# Patient Record
Sex: Female | Born: 1940 | Race: Black or African American | Hispanic: No | Smoking: Never smoker
Health system: Southern US, Community
[De-identification: ages and names within clinical notes are randomized; demographics above are authoritative.]

## PROBLEM LIST (undated history)

## (undated) DIAGNOSIS — I639 Cerebral infarction, unspecified: Secondary | ICD-10-CM

## (undated) DIAGNOSIS — G4730 Sleep apnea, unspecified: Secondary | ICD-10-CM

## (undated) DIAGNOSIS — I10 Essential (primary) hypertension: Secondary | ICD-10-CM

## (undated) DIAGNOSIS — C9210 Chronic myeloid leukemia, BCR/ABL-positive, not having achieved remission: Secondary | ICD-10-CM

## (undated) DIAGNOSIS — M1990 Unspecified osteoarthritis, unspecified site: Secondary | ICD-10-CM

## (undated) DIAGNOSIS — E669 Obesity, unspecified: Secondary | ICD-10-CM

## (undated) DIAGNOSIS — K219 Gastro-esophageal reflux disease without esophagitis: Secondary | ICD-10-CM

## (undated) DIAGNOSIS — E119 Type 2 diabetes mellitus without complications: Secondary | ICD-10-CM

## (undated) HISTORY — PX: COLONOSCOPY: SHX174

## (undated) HISTORY — PX: TUBAL LIGATION: SHX77

## (undated) HISTORY — PX: APPENDECTOMY: SHX54

---

## 2005-03-09 ENCOUNTER — Ambulatory Visit: Payer: Self-pay | Admitting: Orthopedic Surgery

## 2008-12-19 ENCOUNTER — Ambulatory Visit: Payer: Self-pay | Admitting: Family Medicine

## 2009-03-25 ENCOUNTER — Ambulatory Visit: Payer: Self-pay | Admitting: Family Medicine

## 2009-04-02 ENCOUNTER — Ambulatory Visit: Payer: Self-pay | Admitting: Family Medicine

## 2009-04-10 ENCOUNTER — Ambulatory Visit: Payer: Self-pay | Admitting: Family Medicine

## 2009-07-16 ENCOUNTER — Inpatient Hospital Stay: Payer: Self-pay | Admitting: Internal Medicine

## 2009-10-09 ENCOUNTER — Emergency Department: Payer: Self-pay | Admitting: Unknown Physician Specialty

## 2009-10-09 ENCOUNTER — Ambulatory Visit: Payer: Self-pay | Admitting: Family Medicine

## 2009-12-05 ENCOUNTER — Emergency Department: Payer: Self-pay | Admitting: Emergency Medicine

## 2010-01-30 ENCOUNTER — Ambulatory Visit: Payer: Self-pay | Admitting: Orthopedic Surgery

## 2010-02-23 ENCOUNTER — Ambulatory Visit: Payer: Self-pay | Admitting: Pain Medicine

## 2010-03-26 ENCOUNTER — Ambulatory Visit: Payer: Self-pay | Admitting: Pain Medicine

## 2010-03-30 ENCOUNTER — Ambulatory Visit: Payer: Self-pay | Admitting: Pain Medicine

## 2010-05-05 ENCOUNTER — Ambulatory Visit: Payer: Self-pay | Admitting: Pain Medicine

## 2010-05-27 ENCOUNTER — Ambulatory Visit: Payer: Self-pay | Admitting: Pain Medicine

## 2010-06-25 ENCOUNTER — Ambulatory Visit: Payer: Self-pay | Admitting: Pain Medicine

## 2010-06-29 ENCOUNTER — Ambulatory Visit: Payer: Self-pay | Admitting: Pain Medicine

## 2010-09-14 ENCOUNTER — Ambulatory Visit: Payer: Self-pay | Admitting: Family Medicine

## 2010-10-26 ENCOUNTER — Ambulatory Visit: Payer: Self-pay | Admitting: Pain Medicine

## 2010-11-26 ENCOUNTER — Ambulatory Visit: Payer: Self-pay | Admitting: Pain Medicine

## 2010-12-09 ENCOUNTER — Ambulatory Visit: Payer: Self-pay | Admitting: Pain Medicine

## 2011-01-11 ENCOUNTER — Ambulatory Visit: Payer: Self-pay | Admitting: Pain Medicine

## 2011-04-22 ENCOUNTER — Ambulatory Visit: Payer: Self-pay | Admitting: Pain Medicine

## 2011-05-20 ENCOUNTER — Ambulatory Visit: Payer: Self-pay | Admitting: Pain Medicine

## 2011-05-24 ENCOUNTER — Ambulatory Visit: Payer: Self-pay | Admitting: Pain Medicine

## 2011-11-03 ENCOUNTER — Ambulatory Visit: Payer: Self-pay | Admitting: Pain Medicine

## 2011-12-02 ENCOUNTER — Ambulatory Visit: Payer: Self-pay | Admitting: Pain Medicine

## 2011-12-15 ENCOUNTER — Ambulatory Visit: Payer: Self-pay | Admitting: Pain Medicine

## 2011-12-22 ENCOUNTER — Ambulatory Visit: Payer: Self-pay | Admitting: Family Medicine

## 2012-01-13 ENCOUNTER — Ambulatory Visit: Payer: Self-pay | Admitting: Pain Medicine

## 2012-09-21 ENCOUNTER — Ambulatory Visit: Payer: Self-pay | Admitting: Family Medicine

## 2014-01-21 ENCOUNTER — Ambulatory Visit: Payer: Self-pay | Admitting: Gastroenterology

## 2014-01-23 LAB — PATHOLOGY REPORT

## 2014-07-12 ENCOUNTER — Ambulatory Visit: Payer: Self-pay | Admitting: Family Medicine

## 2014-12-05 ENCOUNTER — Ambulatory Visit: Payer: Self-pay | Admitting: Otolaryngology

## 2014-12-05 LAB — CREATININE, SERUM
Creatinine: 0.96 mg/dL (ref 0.60–1.30)
EGFR (African American): >60

## 2015-09-16 ENCOUNTER — Emergency Department (HOSPITAL_COMMUNITY)
Admission: EM | Admit: 2015-09-16 | Discharge: 2015-09-16 | Disposition: A | Discharge status: Home / Self Care | Payer: Medicare HMO | Source: Home / Self Care | Attending: Emergency Medicine | Admitting: Emergency Medicine

## 2015-09-16 ENCOUNTER — Encounter (HOSPITAL_COMMUNITY): Payer: Self-pay | Admitting: *Deleted

## 2015-09-16 ENCOUNTER — Emergency Department (INDEPENDENT_AMBULATORY_CARE_PROVIDER_SITE_OTHER): Payer: Medicare HMO

## 2015-09-16 DIAGNOSIS — M542 Cervicalgia: Secondary | ICD-10-CM

## 2015-09-16 DIAGNOSIS — M62838 Other muscle spasm: Secondary | ICD-10-CM

## 2015-09-16 HISTORY — DX: Sleep apnea, unspecified: G47.30

## 2015-09-16 HISTORY — DX: Essential (primary) hypertension: I10

## 2015-09-16 HISTORY — DX: Chronic myeloid leukemia, BCR/ABL-positive, not having achieved remission: C92.10

## 2015-09-16 MED ORDER — LORAZEPAM 2 MG/ML IJ SOLN
1.0000 mg | Freq: Once | INTRAMUSCULAR | Status: AC
Start: 2015-09-16 — End: 2015-09-16
  Administered 2015-09-16: 1 mg via INTRAMUSCULAR

## 2015-09-16 MED ORDER — HYDROCODONE-ACETAMINOPHEN 5-325 MG PO TABS
ORAL_TABLET | ORAL | Status: AC
  Filled 2015-09-16: qty 2

## 2015-09-16 MED ORDER — METAXALONE 800 MG PO TABS: 800.0000 mg | ORAL_TABLET | Freq: Three times a day (TID) | ORAL | Status: DC

## 2015-09-16 MED ORDER — HYDROCODONE-ACETAMINOPHEN 5-325 MG PO TABS
1.0000 | ORAL_TABLET | ORAL | Status: DC | PRN
Start: 2015-09-16 — End: 2020-03-03

## 2015-09-16 MED ORDER — HYDROCODONE-ACETAMINOPHEN 5-325 MG PO TABS
2.0000 | ORAL_TABLET | Freq: Once | ORAL | Status: AC
  Administered 2015-09-16: 2 via ORAL

## 2015-09-16 MED ORDER — LORAZEPAM 2 MG/ML IJ SOLN
INTRAMUSCULAR | Status: AC
  Filled 2015-09-16: qty 1

## 2015-09-16 MED ORDER — DICLOFENAC SODIUM 50 MG PO TBEC: 50.0000 mg | DELAYED_RELEASE_TABLET | Freq: Two times a day (BID) | ORAL | Status: AC

## 2015-09-16 NOTE — ED Notes (Signed)
Pt  Has  Neck pain     As  Well  As  A   Cough        And  Congestion        X  sev  Days       Pt  Reports  The  Pain  Is  Worse   On  Movement  And    Position       She        Is   Sitting       Upright        On  Exam table  Speaking  In  Complete  sentances

## 2015-09-16 NOTE — ED Provider Notes (Signed)
HPI  SUBJECTIVE:  Pamela Cox is a 74 y.o. female who presents with 2 days of right-sided neck pain that she describes a sharp, stabbing. States that it is primarily along the upper neck shoulder, and behind her ear. States is intermittent, lasting hours. States the area feels swollen. Symptoms are better with ibuprofen, worse with turning her head to the right more than the left. She tried IBU 800 mg last dose at 1700, Voltaren gel, bio flex gel, heating pad,  Flexeril. She denies nausea, vomiting, fever, hot potato voice, sore throat, arm numbness, tingling, arm weakness. She reports intermittent right-sided shoulder pain for the past 3 weeks with radiation to the thumb. She denies any recent or remote history of injury to her neck. She states that her symptoms started after she "slept wrong on her neck". She has not had any change in her physical activity. Past medical history of hypertension, CML leukemia, left-sided stroke, OA, history of diabetes, history of herniated lumbar disc. She is not on any medications that would cause torticollis.  Past Medical History  Diagnosis Date  . Hypertension   . Myelocytic leukemia, chronic ([place])   . Sleep apnea     History reviewed. No pertinent past surgical history.  History reviewed. No pertinent family history.  Social History  Substance Use Topics  . Smoking status: Never Smoker   . Smokeless tobacco: None  . Alcohol Use: No    No current facility-administered medications for this encounter.  Current outpatient prescriptions:  .  Fexofenadine HCl (ALLEGRA PO), Take by mouth., Disp: , Rfl:  .  diclofenac (VOLTAREN) 50 MG EC tablet, Take 1 tablet (50 mg total) by mouth 2 (two) times daily., Disp: 20 tablet, Rfl: 0 .  HYDROcodone-acetaminophen (NORCO/VICODIN) 5-325 MG tablet, Take 1-2 tablets by mouth every 4 (four) hours as needed for moderate pain., Disp: 20 tablet, Rfl: 0 .  metaxalone (SKELAXIN) 800 MG tablet, Take 1 tablet (800 mg  total) by mouth 3 (three) times daily., Disp: 21 tablet, Rfl: 0  No Known Allergies   ROS  As noted in HPI.   Physical Exam  BP 157/89 mmHg  Pulse 73  Temp(Src) 98.9 F (37.2 C) (Oral)  Resp 16  SpO2 96%  Constitutional: Well developed, well nourished, moderate painful distress Eyes:  EOMI, conjunctiva normal bilaterally HENT: Normocephalic, atraumatic,mucus membranes moist. No tenderness along the right TMJ. No drooling, no trismus. Voice normal.  Neck: Right-sided Trapezius, SCM, tenderness, muscle spasm. + Limitation of motion with head rotation. Patient able to flex/extend neck. No meningismus. Respiratory: Normal inspiratory effort Cardiovascular: Normal rate. No carotid bruit GI: nondistended skin: No rash, skin intact Musculoskeletal: Normal shoulder exam, normal right upper extremity exam. Grip strength 5/5 equal bilaterally. Sensation grossly intact distally. Biceps triceps tendon 5/5 equal bilaterally. Neurologic: Alert & oriented x 3, no focal neuro deficits Psychiatric: Speech and behavior appropriate   ED Course   Medications  LORazepam (ATIVAN) injection 1 mg (1 mg Intramuscular Given 09/16/15 2057)  HYDROcodone-acetaminophen (NORCO/VICODIN) 5-325 MG per tablet 2 tablet (2 tablets Oral Given 09/16/15 2134)    Orders Placed This Encounter  Procedures  . DG Cervical Spine Complete    Standing Status: Standing     Number of Occurrences: 1     Standing Expiration Date:     Order Specific Question:  Reason for Exam (SYMPTOM  OR DIAGNOSIS REQUIRED)    Answer:  R sided neck pain h/o CML r/o mets, DJD, fx, dislocation  Comments:  R sided neck pain h/o CML r/o mets, DJD, fx, dislocation    No results found for this or any previous visit (from the past 24 hour(s)). Dg Cervical Spine Complete  09/16/2015  CLINICAL DATA:  Slept on pillow wrong, with right-sided neck pain. Initial encounter. EXAM: CERVICAL SPINE - COMPLETE 4+ VIEW COMPARISON:  None. FINDINGS:  There is no evidence of fracture or subluxation. Anterior osteophytes are noted along the cervical spine, with mild disc space narrowing noted at C4-C5. Vertebral bodies demonstrate normal height and alignment. Prevertebral soft tissues are within normal limits. The provided odontoid view demonstrates no significant abnormality. The visualized lung apices are clear. IMPRESSION: 1. No evidence of fracture or subluxation along the cervical spine. 2. Minimal degenerative change along the cervical spine. Electronically Signed   By: Garald Balding M.D.   On: 09/16/2015 21:26    ED Clinical Impression  Neck pain  Muscle spasms of neck  ED Assessment/Plan   Patient appears to be in moderate amount of pain and has significant muscle spasm gave Ativan 1 mg IM, norco 2 tabs po, with improvement in symptoms. Patient already medicated with NSAIDs at home. Presentation is most consistent with a muscle strain/sprain/torticollis. No evidence of deep space oropharyngeal infection, meningitis, dissection, septic phlebitis, spinal epidural hematoma.  Home with diclofenac, skelaxin, norco.  Getting C-spine x-ray to rule out any acute changes most specifically pathologic fracture or metastases.  Reviewed imaging independently. Minimal Degenerative changes along cervical spine, no fracture, dislocation. See radiology report for full details.  Discussed imaging, MDM, plan and followup with patient . Discussed sn/sx that should prompt return to the UC or ED. Patient  agrees with plan.   *This clinic note was created using Dragon dictation software. Therefore, there may be occasional mistakes despite careful proofreading.  ?    Melynda Ripple, MD 09/16/15 2152

## 2016-04-14 ENCOUNTER — Other Ambulatory Visit: Payer: Self-pay | Admitting: Family Medicine

## 2018-08-23 ENCOUNTER — Ambulatory Visit: Payer: Medicare HMO | Attending: Physical Medicine and Rehabilitation | Admitting: Physical Therapy

## 2018-08-23 ENCOUNTER — Encounter: Payer: Self-pay | Admitting: Physical Therapy

## 2018-08-23 ENCOUNTER — Other Ambulatory Visit: Payer: Self-pay

## 2018-08-23 DIAGNOSIS — Z9289 Personal history of other medical treatment: Secondary | ICD-10-CM | POA: Insufficient documentation

## 2018-08-23 DIAGNOSIS — M5136 Other intervertebral disc degeneration, lumbar region: Secondary | ICD-10-CM | POA: Insufficient documentation

## 2018-08-23 DIAGNOSIS — R262 Difficulty in walking, not elsewhere classified: Secondary | ICD-10-CM

## 2018-08-23 DIAGNOSIS — M5116 Intervertebral disc disorders with radiculopathy, lumbar region: Secondary | ICD-10-CM | POA: Insufficient documentation

## 2018-08-23 DIAGNOSIS — M6281 Muscle weakness (generalized): Secondary | ICD-10-CM

## 2018-08-23 DIAGNOSIS — M1990 Unspecified osteoarthritis, unspecified site: Secondary | ICD-10-CM | POA: Insufficient documentation

## 2018-08-23 DIAGNOSIS — M25511 Pain in right shoulder: Secondary | ICD-10-CM

## 2018-08-23 DIAGNOSIS — M169 Osteoarthritis of hip, unspecified: Secondary | ICD-10-CM | POA: Insufficient documentation

## 2018-08-23 DIAGNOSIS — M5442 Lumbago with sciatica, left side: Secondary | ICD-10-CM | POA: Diagnosis present

## 2018-08-23 DIAGNOSIS — R0602 Shortness of breath: Secondary | ICD-10-CM | POA: Insufficient documentation

## 2018-08-23 DIAGNOSIS — M25512 Pain in left shoulder: Secondary | ICD-10-CM | POA: Insufficient documentation

## 2018-08-23 DIAGNOSIS — M5441 Lumbago with sciatica, right side: Secondary | ICD-10-CM | POA: Insufficient documentation

## 2018-08-23 DIAGNOSIS — M5416 Radiculopathy, lumbar region: Secondary | ICD-10-CM | POA: Insufficient documentation

## 2018-08-23 DIAGNOSIS — R293 Abnormal posture: Secondary | ICD-10-CM | POA: Diagnosis present

## 2018-08-23 DIAGNOSIS — F341 Dysthymic disorder: Secondary | ICD-10-CM | POA: Insufficient documentation

## 2018-08-23 DIAGNOSIS — M48062 Spinal stenosis, lumbar region with neurogenic claudication: Secondary | ICD-10-CM | POA: Insufficient documentation

## 2018-08-23 DIAGNOSIS — M47816 Spondylosis without myelopathy or radiculopathy, lumbar region: Secondary | ICD-10-CM | POA: Insufficient documentation

## 2018-08-23 DIAGNOSIS — M75121 Complete rotator cuff tear or rupture of right shoulder, not specified as traumatic: Secondary | ICD-10-CM | POA: Insufficient documentation

## 2018-08-23 DIAGNOSIS — C9210 Chronic myeloid leukemia, BCR/ABL-positive, not having achieved remission: Secondary | ICD-10-CM | POA: Insufficient documentation

## 2018-08-23 DIAGNOSIS — K219 Gastro-esophageal reflux disease without esophagitis: Secondary | ICD-10-CM | POA: Insufficient documentation

## 2018-08-23 DIAGNOSIS — E785 Hyperlipidemia, unspecified: Secondary | ICD-10-CM | POA: Insufficient documentation

## 2018-08-23 DIAGNOSIS — G8929 Other chronic pain: Secondary | ICD-10-CM

## 2018-08-23 DIAGNOSIS — Z79899 Other long term (current) drug therapy: Secondary | ICD-10-CM | POA: Insufficient documentation

## 2018-08-23 DIAGNOSIS — Z8673 Personal history of transient ischemic attack (TIA), and cerebral infarction without residual deficits: Secondary | ICD-10-CM | POA: Insufficient documentation

## 2018-08-23 DIAGNOSIS — M51369 Other intervertebral disc degeneration, lumbar region without mention of lumbar back pain or lower extremity pain: Secondary | ICD-10-CM | POA: Insufficient documentation

## 2018-08-23 DIAGNOSIS — E559 Vitamin D deficiency, unspecified: Secondary | ICD-10-CM | POA: Insufficient documentation

## 2018-08-23 DIAGNOSIS — G4733 Obstructive sleep apnea (adult) (pediatric): Secondary | ICD-10-CM | POA: Insufficient documentation

## 2018-08-23 DIAGNOSIS — J301 Allergic rhinitis due to pollen: Secondary | ICD-10-CM | POA: Insufficient documentation

## 2018-08-23 DIAGNOSIS — I10 Essential (primary) hypertension: Secondary | ICD-10-CM | POA: Insufficient documentation

## 2018-08-23 NOTE — Patient Instructions (Signed)
Access Code: [email or web address]  URL: [email or web address]  Date: 08/23/2018  Prepared by: Venetia Night Taunya Goral   Exercises  Hooklying Single Knee to Chest - 10 reps - 2 sets - 5 hold - 1x daily - 7x weekly  Supine Piriformis Stretch with Foot on Ground - 3 reps - 1 sets - 20 hold - 1x daily - 7x weekly  Patient Education  Rolling From Your Back to Your Side

## 2018-08-23 NOTE — Therapy (Signed)
[facility] [address], [place], [place], [address] Phone: [phone]   Fax:  [phone]  Physical Therapy Evaluation  Patient Details  Name: Pamela Cox MRN: [id number] Date of Birth: Sep 04, 1941 Referring Provider (PT): Edger House, MD   Encounter Date: 08/23/2018  PT End of Session - 08/23/18 2052    Visit Number  1    Date for PT Re-Evaluation  10/18/18    Authorization Type  [employer]    PT Start Time  [id number]    PT Stop Time  1022    PT Time Calculation (min)  45 min    Activity Tolerance  Patient tolerated treatment well;Patient limited by pain    Behavior During Therapy  [facility] for tasks assessed/performed       Past Medical History:  Diagnosis Date  . Hypertension   . Myelocytic leukemia, chronic ([place])   . Sleep apnea     History reviewed. No pertinent surgical history.  There were no vitals filed for this visit.   Subjective Assessment - 08/23/18 2032    Subjective  Pt reports a pinched nerve in her back.  She has had 2 epidural injections which has helped her back pain but she continues to have bil Lt>Rt buttock, hip and posterior thigh pain.    Patient is accompained by:  Family member    Limitations  Sitting;Standing;Walking;House hold activities;Other (comment)    How long can you sit comfortably?  1 hour    How long can you stand comfortably?  5 min    How long can you walk comfortably?  5 min, uses 2 wheel walker in community, wheelchair at home to be able to sit     Diagnostic tests  MRI through [facility]    Patient Stated Goals  avoid surgery, improve walking, reduce pain    Currently in Pain?  Yes    Pain Score  6     Pain Location  Buttocks    Pain Orientation  Right;Left;Posterior    Pain Descriptors / Indicators  Tightness;Spasm;Dull    Pain Type  Acute pain    Pain Radiating Towards  bil Lt>Rt buttocks, hips, posterior thighs    Pain Onset  More than a month ago    Pain  Frequency  Intermittent    Aggravating Factors   standing/walking, stairs, sitting too long in wheelchair, in/out of bed    Pain Relieving Factors  meds, topical pain cream, laying down, ice    Effect of Pain on Daily Activities  difficulty getting up/down stairs at home, sits in wheelchair on first floor all day, uses 2 wheel walker if needs to walk         [facility] PT Assessment - 08/23/18 0001      Assessment   Medical Diagnosis  M51.16 (ICD-10-CM) - Intervertebral disc disorders with radiculopathy, lumbar region    Referring Provider (PT)  Edger House, MD    Next MD Visit  as needed   Pt states lumbar surgery is next step if PT doesn't help   Prior Therapy  no      Balance Screen   Has the patient fallen in the past 6 months  No    Has the patient had a decrease in activity level because of a fear of falling?   Yes   in community   Is the patient reluctant to leave their home because of a fear of falling?   No  Home Environment   Living Environment  Private residence    Living Arrangements  Children;Other relatives   adult daughter and grandson   Available Help at Discharge  Family    Type of [place] Access  Level entry    [place]  Two level    Alternate Level Stairs-Number of Steps  one flight    Alternate Level Stairs-Rails  [place] - 2 wheels;Wheelchair - manual;Toilet riser;Shower seat      Prior Function   Level of Independence  Independent with household mobility with device;Requires assistive device for independence;Independent with community mobility with device    Vocation  Retired    Leisure  get out of the house, ride in car, read      Cognition   Overall Cognitive Status  Within Functional Limits for tasks assessed      Observation/Other Assessments   Observations  obese    Focus on Therapeutic Outcomes (FOTO)   --   will do FOTO survey next visit     Posture/Postural Control   Posture/Postural Control  Postural  limitations    Postural Limitations  Flexed trunk;Weight shift left;Right pelvic obliquity      Tone   Assessment Location  Left Lower Extremity      ROM / Strength   AROM / PROM / Strength  AROM;Strength      AROM   Overall AROM   Deficits    AROM Assessment Site  Lumbar;Hip    Right/Left Hip  Right;Left   bil hips grossly WNL for tasks assessed   Lumbar Flexion  12   Pt able to perform greater trunk flexion via hip flexion   Lumbar Extension  5    Lumbar - Right Side Bend  8    Lumbar - Left Side Bend  10    Lumbar - Right Rotation  5    Lumbar - Left Rotation  5      Strength   Overall Strength  Deficits    Strength Assessment Site  Lumbar;Hip;Knee;Ankle    Right/Left Hip  Right;Left    Right Hip Flexion  4-/5    Right Hip Extension  4-/5    Right Hip External Rotation   4-/5    Right Hip Internal Rotation  4-/5    Right Hip ABduction  3/5    Right Hip ADduction  4-/5    Left Hip Flexion  4-/5    Left Hip Extension  4-/5    Left Hip External Rotation  4-/5    Left Hip Internal Rotation  4-/5    Left Hip ABduction  3/5    Left Hip ADduction  4-/5    Right/Left Knee  Right;Left    Right Knee Flexion  4+/5    Right Knee Extension  4/5    Left Knee Flexion  4+/5    Left Knee Extension  4/5    Right/Left Ankle  Right;Left   bil ankles 5/5     Flexibility   Soft Tissue Assessment /Muscle Length  yes    Hamstrings  Lt limited by 30 degrees compared to Rt      Palpation   Spinal mobility  poor gross lumbar mobility with A/ROM      Special Tests    Special Tests  Lumbar    Lumbar Tests  Straight Leg Raise      Straight Leg Raise   Findings  Positive  Side   Left    Comment  at approx 60 degrees      Bed Mobility   Bed Mobility  Sit to Sidelying Left;Sit to Supine;Supine to Sit;Rolling Left   Pt needed max cueing for form, difficulty due to obesity     Ambulation/Gait   Ambulation/Gait  Yes    Ambulation/Gait Assistance  6: Modified independent  (Device/Increase time)    Assistive device  Rolling walker    Gait Comments  flexed trunk      LLE Tone   LLE Tone  Moderate;Hypertonic      LLE Tone   Hypertonic Details  hamstring                Objective measurements completed on examination: See above findings.      [facility] PT Treatment/Exercise - 08/23/18 0001      Exercises   Exercises  Lumbar      Lumbar Exercises: Stretches   Single Knee to Chest Stretch  Right;Left;5 reps;10 seconds    Single Knee to Chest Stretch Limitations  use a towel to help with UEs    Piriformis Stretch  Right;Left;20 seconds;2 reps    Piriformis Stretch Limitations  use a towel to assist with UEs             PT Education - 08/23/18 1347    Education Details  Access Code: [email or web address], beginning of HEP, evaluation findings    Person(s) Educated  Patient;Child(ren)    Methods  Explanation;Demonstration;Verbal cues;Handout    Comprehension  Verbalized understanding;Returned demonstration       PT Short Term Goals - 08/23/18 2106      PT SHORT TERM GOAL #1   Title  Pt will be independent in initial HEP.    Time  3    Period  Weeks    Status  New    Target Date  09/13/18      PT SHORT TERM GOAL #2   Title  Pt will report at least 20% improvement in pain throughout the day.    Time  4    Period  Weeks    Status  New    Target Date  09/20/18      PT SHORT TERM GOAL #3   Title  Pt will be able to tolerate standing or walking activity for > or = 10 min with pain rating < or = 6/10.    Time  4    Period  Weeks    Status  New    Target Date  09/20/18        PT Long Term Goals - 08/23/18 2108      PT LONG TERM GOAL #1   Title  Pt will be independent in advanced HEP.    Time  8    Period  Weeks    Status  New    Target Date  10/18/18      PT LONG TERM GOAL #2   Title  Pt will report at least 30% improvement in pain throughout the day.    Time  8    Period  Weeks    Status  New    Target Date  10/18/18       PT LONG TERM GOAL #3   Title  Pt will be able to go up and down one flight of stairs with improved body mechanics to get to and from her bedroom to increase safety and energy conservation.    Time  8    Period  Weeks    Status  New    Target Date  10/18/18      PT LONG TERM GOAL #4   Title  Pt will report incoropartion of 5-10 min of standing or walking tasks out of wheelchair at least 2 times daily with pain rating of < or = 5/10 to increase her mobility and endurance.    Time  8    Period  Weeks    Status  New    Target Date  10/18/18             Plan - 08/23/18 2053    Clinical Impression Statement  Pt is referred to PT with severe lumbar degenerative changes and bilateral radiculopathy Lt>Rt causing radiating pain into buttocks and posterior thighs.  Recent lumbar injections improved her back pain but her LE pain is severe.  Pt reports if PT doesn't help she will likely need surgery per the MD.  She has an extensive past medical history including obesity and is grossly limited in her mobility.  She was accompanied today by her adult daugther with who she lives.  She spends most of her day sitting at home in a wheelchair and struggles to negotiate the flight of stairs up and down from her second floor bedroom.  She has deconditioned weakness throughout bilateral LEs and significant trunk ROM deficits.  She is able to ambulate independently with a rolling walker.  She will benefit from skilled PT to improve her strength, ROM, body mechanics with bed mobility and transfers, postural re-ed, gait pattern and endurance, neural mobility of Lt LE and safety with daily task performance.    History and Personal Factors relevant to plan of care:  obesity, sedentary lifestyle, extensive past medical history, requires walker for gait and uses wheelchair at home    Clinical Presentation  Stable    Clinical Decision Making  Low    Rehab Potential  Good    PT Frequency  2x / week    PT Duration   8 weeks    PT Treatment/Interventions  ADLs/Self Care Home Management;Electrical Stimulation;Cryotherapy;Moist Heat;Traction;Gait training;Stair training;Functional mobility training;Therapeutic activities;Therapeutic exercise;Balance training;Neuromuscular re-education;Patient/family education;Manual techniques;Passive range of motion;Dry needling;Taping;Energy conservation;Joint Manipulations    PT Next Visit Plan  f/u on HEP (SKTC, piriformis stretch, review bed mobility), begin trunk and LE strengthening as tolerated, postural awareness, aerobic exercise as tolerated, add Lt LE neural glides    PT Home Exercise Plan  Access Code: [email or web address]    Consulted and Agree with Plan of Care  Patient;Family member/caregiver    Family Member Consulted  yes       Patient will benefit from skilled therapeutic intervention in order to improve the following deficits and impairments:  Abnormal gait, Decreased range of motion, Difficulty walking, Impaired tone, Cardiopulmonary status limiting activity, Decreased endurance, Increased muscle spasms, Obesity, Decreased activity tolerance, Pain, Hypomobility, Impaired flexibility, Improper body mechanics, Decreased strength, Decreased mobility, Postural dysfunction  Visit Diagnosis: Chronic bilateral low back pain with bilateral sciatica - Plan: PT plan of care cert/re-cert  Abnormal posture - Plan: PT plan of care cert/re-cert  Muscle weakness (generalized) - Plan: PT plan of care cert/re-cert  Difficulty in walking, not elsewhere classified - Plan: PT plan of care cert/re-cert     Problem List Patient Active Problem List   Diagnosis Date Noted  . Allergic rhinitis due to pollen 08/23/2018  . DDD (degenerative disc disease), lumbar 08/23/2018  . Lumbar neuritis 08/23/2018  .  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  . GERD (gastroesophageal reflux disease) 08/23/2018  . Morbid obesity ([place]) 08/23/2018  . Obstructive sleep apnea 08/23/2018  . Intervertebral disc  disorder with radiculopathy of lumbar region 08/23/2018  . CML (chronic myelocytic leukemia) ([place]) 08/23/2018  . Hx of myocardial perfusion scan 08/23/2018  . Osteoarthritis 08/23/2018  . Degenerative arthritis of hip 08/23/2018  . Lumbar spondylosis 08/23/2018  . SOB (shortness of breath) 08/23/2018  . History of CVA (cerebrovascular accident) 08/23/2018  . Essential hypertension 08/23/2018  . Chronic right shoulder pain 08/23/2018  . Complete tear of right rotator cuff 08/23/2018  . Acute pain of left shoulder 08/23/2018  . On antineoplastic chemotherapy 08/23/2018  . Hyperlipidemia 08/23/2018  . Neurogenic claudication due to lumbar spinal stenosis 08/23/2018  . Vitamin D deficiency 08/23/2018   Baruch Merl, PT 08/23/18 9:26 PM    [facility] [address], [place], [place], [address] Phone: [phone]   Fax:  [phone]  Name: Galilea Quito MRN: [id number] Date of Birth: 1941-03-29

## 2018-08-25 ENCOUNTER — Ambulatory Visit: Payer: Medicare HMO | Admitting: Physical Therapy

## 2018-08-25 ENCOUNTER — Encounter: Payer: Self-pay | Admitting: Physical Therapy

## 2018-08-25 DIAGNOSIS — G8929 Other chronic pain: Secondary | ICD-10-CM

## 2018-08-25 DIAGNOSIS — M6281 Muscle weakness (generalized): Secondary | ICD-10-CM

## 2018-08-25 DIAGNOSIS — R262 Difficulty in walking, not elsewhere classified: Secondary | ICD-10-CM

## 2018-08-25 DIAGNOSIS — R293 Abnormal posture: Secondary | ICD-10-CM

## 2018-08-25 DIAGNOSIS — M5442 Lumbago with sciatica, left side: Secondary | ICD-10-CM | POA: Diagnosis not present

## 2018-08-25 DIAGNOSIS — M5441 Lumbago with sciatica, right side: Principal | ICD-10-CM

## 2018-08-25 NOTE — Therapy (Signed)
[facility] [address] [place], [place], [address] Phone: [phone]   Fax:  [phone]  Physical Therapy Treatment  Patient Details  Name: Pamela Cox MRN: [id number] Date of Birth: 1941-04-28 Referring Provider (PT): Edger House, MD   Encounter Date: 08/25/2018  PT End of Session - 08/25/18 1120    Visit Number  2    Date for PT Re-Evaluation  10/18/18    Authorization Type  [employer]    PT Start Time  1103    PT Stop Time  1152    PT Time Calculation (min)  49 min    Activity Tolerance  Patient tolerated treatment well;Patient limited by pain    Behavior During Therapy  [facility] for tasks assessed/performed       Past Medical History:  Diagnosis Date  . Hypertension   . Myelocytic leukemia, chronic ([place])   . Sleep apnea     History reviewed. No pertinent surgical history.  There were no vitals filed for this visit.  Subjective Assessment - 08/25/18 1101    Subjective  Pt reports she feels that the stretches help but then increase her pain later.      Patient is accompained by:  Family member    Limitations  Sitting;Standing;Walking;House hold activities;Other (comment)    How long can you sit comfortably?  1 hour    How long can you stand comfortably?  5 min    How long can you walk comfortably?  5 min, uses 2 wheel walker in community, wheelchair at home to be able to sit     Diagnostic tests  MRI through [facility]    Patient Stated Goals  avoid surgery, improve walking, reduce pain    Currently in Pain?  Yes    Pain Score  6     Pain Location  Buttocks    Pain Orientation  Right;Left;Posterior    Pain Descriptors / Indicators  Dull    Pain Type  Chronic pain;Acute pain    Pain Onset  More than a month ago                       [facility] Adult PT Treatment/Exercise - 08/25/18 0001      Bed Mobility   Bed Mobility  Sit to Sidelying Right;Rolling Left;Rolling Right;Sit to Sidelying  Left;Right Sidelying to Sit;Left Sidelying to Sit   PT cued Pt to use core during transitions/UEs to assist     Exercises   Exercises  Lumbar;Knee/Hip      Lumbar Exercises: Seated   Other Seated Lumbar Exercises  large ball rollouts for trunk flexion x 10 reps    Other Seated Lumbar Exercises  high density pressure release balls bil hips, rocking back and forth over them bil   also performed in supine hooklying      Lumbar Exercises: Supine   Ab Set  10 reps;5 seconds    Large Ball Abdominal Isometric  5 seconds;10 reps    Large Ball Oblique Isometric  5 seconds;10 reps   bil     Manual Therapy   Manual Therapy  Soft tissue mobilization    Soft tissue mobilization  Lt posterior hip, glut min/med/max in Rt sidelying, rolling stick to Lt posterior thigh             PT Education - 08/25/18 1157    Person(s) Educated  Patient;Child(ren)    Methods  Explanation;Demonstration;Verbal cues    Comprehension  Verbalized understanding;Returned demonstration       PT Short Term Goals - 08/23/18 2106      PT SHORT TERM GOAL #1   Title  Pt will be independent in initial HEP.    Time  3    Period  Weeks    Status  New    Target Date  09/13/18      PT SHORT TERM GOAL #2   Title  Pt will report at least 20% improvement in pain throughout the day.    Time  4    Period  Weeks    Status  New    Target Date  09/20/18      PT SHORT TERM GOAL #3   Title  Pt will be able to tolerate standing or walking activity for > or = 10 min with pain rating < or = 6/10.    Time  4    Period  Weeks    Status  New    Target Date  09/20/18        PT Long Term Goals - 08/23/18 2108      PT LONG TERM GOAL #1   Title  Pt will be independent in advanced HEP.    Time  8    Period  Weeks    Status  New    Target Date  10/18/18      PT LONG TERM GOAL #2   Title  Pt will report at least 30% improvement in pain throughout the day.    Time  8    Period  Weeks    Status  New    Target Date   10/18/18      PT LONG TERM GOAL #3   Title  Pt will be able to go up and down one flight of stairs with improved body mechanics to get to and from her bedroom to increase safety and energy conservation.    Time  8    Period  Weeks    Status  New    Target Date  10/18/18      PT LONG TERM GOAL #4   Title  Pt will report incoropartion of 5-10 min of standing or walking tasks out of wheelchair at least 2 times daily with pain rating of < or = 5/10 to increase her mobility and endurance.    Time  8    Period  Weeks    Status  New    Target Date  10/18/18            Plan - 08/25/18 1159    Clinical Impression Statement  Pt with pain relief today with manual therapy via soft tissue mobilization to Lt buttock and posterior hip.  PT spent time having her practice self-release techniques today in sitting and supine with high density balls which she really felt relief from.  PT provided mod-max cueing for bed mobility mechanics with use of core as well as veral cueing for core recruitment during basic level lumbar stabilization exercises today.  PT consulted with Pt's adult daughter about the idea of putting a piece of plywood in base of Pt's wheelchair to provide better postural support throughout her day.  PT explained to Pt the goal of standing and walking on main level of home several times daily with walker to increase standing/walking endurance and changes of position as tolerated.  Pt will benefit from continued manual therapy and skilled progression of core and ther ex to address her  pain and deficits.    Rehab Potential  Good    PT Frequency  2x / week    PT Duration  8 weeks    PT Treatment/Interventions  ADLs/Self Care Home Management;Electrical Stimulation;Cryotherapy;Moist Heat;Traction;Gait training;Stair training;Functional mobility training;Therapeutic activities;Therapeutic exercise;Balance training;Neuromuscular re-education;Patient/family education;Manual techniques;Passive  range of motion;Dry needling;Taping;Energy conservation;Joint Manipulations    PT Next Visit Plan  dry needling to Lt posterior hip/buttocks, continue bed mobility, transfers, core stabilization, manual therapy to soft tissues of bil buttocks    PT Home Exercise Plan  Access Code: [email or web address]    Consulted and Agree with Plan of Care  Patient;Family member/caregiver       Patient will benefit from skilled therapeutic intervention in order to improve the following deficits and impairments:  Abnormal gait, Decreased range of motion, Difficulty walking, Impaired tone, Cardiopulmonary status limiting activity, Decreased endurance, Increased muscle spasms, Obesity, Decreased activity tolerance, Pain, Hypomobility, Impaired flexibility, Improper body mechanics, Decreased strength, Decreased mobility, Postural dysfunction  Visit Diagnosis: Chronic bilateral low back pain with bilateral sciatica  Abnormal posture  Muscle weakness (generalized)  Difficulty in walking, not elsewhere classified     Problem List Patient Active Problem List   Diagnosis Date Noted  . Allergic rhinitis due to pollen 08/23/2018  . DDD (degenerative disc disease), lumbar 08/23/2018  . Lumbar neuritis 08/23/2018  . 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  . GERD (gastroesophageal reflux disease) 08/23/2018  . Morbid obesity ([place]) 08/23/2018  . Obstructive sleep apnea 08/23/2018  . Intervertebral disc disorder with radiculopathy of lumbar region 08/23/2018  . CML (chronic myelocytic leukemia) ([place]) 08/23/2018  . Hx of myocardial perfusion scan 08/23/2018  . Osteoarthritis 08/23/2018  . Degenerative arthritis of hip 08/23/2018  . Lumbar spondylosis 08/23/2018  . SOB (shortness of breath) 08/23/2018  . History of CVA (cerebrovascular accident) 08/23/2018  . Essential hypertension 08/23/2018  . Chronic right shoulder pain 08/23/2018  . Complete tear of right rotator cuff 08/23/2018  . Acute pain of left shoulder 08/23/2018  .  On antineoplastic chemotherapy 08/23/2018  . Hyperlipidemia 08/23/2018  . Neurogenic claudication due to lumbar spinal stenosis 08/23/2018  . Vitamin D deficiency 08/23/2018   Baruch Merl, PT 08/25/18 12:09 PM    [facility] [address] [place], [place], [address] Phone: [phone]   Fax:  [phone]  Name: Pamela Cox MRN: [id number] Date of Birth: [birth year]

## 2018-08-25 NOTE — Patient Instructions (Addendum)
Use of high density balls for posterior hip and buttock muscle release in sitting and supine, placement of plywood or something more firm in base of wheelchair for seated stability and postural support throughout the day at home.

## 2018-08-28 ENCOUNTER — Encounter: Payer: Self-pay | Admitting: Physical Therapy

## 2018-08-28 ENCOUNTER — Ambulatory Visit: Payer: Medicare HMO | Admitting: Physical Therapy

## 2018-08-28 DIAGNOSIS — R293 Abnormal posture: Secondary | ICD-10-CM

## 2018-08-28 DIAGNOSIS — M6281 Muscle weakness (generalized): Secondary | ICD-10-CM

## 2018-08-28 DIAGNOSIS — G8929 Other chronic pain: Secondary | ICD-10-CM

## 2018-08-28 DIAGNOSIS — M5442 Lumbago with sciatica, left side: Secondary | ICD-10-CM | POA: Diagnosis not present

## 2018-08-28 DIAGNOSIS — M5441 Lumbago with sciatica, right side: Principal | ICD-10-CM

## 2018-08-28 DIAGNOSIS — R262 Difficulty in walking, not elsewhere classified: Secondary | ICD-10-CM

## 2018-08-28 NOTE — Patient Instructions (Signed)
Access Code: [email or web address]  URL: [email or web address]  Date: 08/28/2018  Prepared by: Venetia Night Beuhring   Exercises  Hooklying Single Knee to Chest - 10 reps - 2 sets - 5 hold - 1x daily - 7x weekly  Supine Piriformis Stretch with Foot on Ground - 3 reps - 1 sets - 20 hold - 1x daily - 7x weekly  Seated Slump Nerve Glide - 10 reps - 3 sets - 1x daily - 7x weekly  Standing Transverse Abdominis Contraction - 10 reps - 3 sets - 10 hold - 1x daily - 7x weekly  Sidelying Transversus Abdominis Bracing - 10 reps - 3 sets - 10 hold - 1x daily - 7x weekly  Supine Transversus Abdominis Bracing - Hands on Stomach - 10 reps - 3 sets - 10 hold - 1x daily - 7x weekly  Seated Alternating Shoulder Row with Resistance Anchored at Feet - 10 reps - 3 sets - 1x daily - 7x weekly  Seated Hip Adduction Squeeze with Ball - 20 reps - 2 sets - 5 hold - 1x daily - 7x weekly  Seated Marching with Opposite Shoulder Flexion - 20 reps - 2 sets - 1x daily - 7x weekly  Patient Education  Rolling From Your Back to Your Side

## 2018-08-28 NOTE — Therapy (Signed)
[facility] [address], [place], [place], [address] Phone: [phone]   Fax:  [phone]  Physical Therapy Treatment  Patient Details  Name: Pamela Cox MRN: [id number] Date of Birth: [birth year] Referring Provider (PT): Edger House, MD   Encounter Date: 08/28/2018  PT End of Session - 08/28/18 1330    Visit Number  3    Date for PT Re-Evaluation  10/18/18    Authorization Type  [employer]    PT Start Time  1005    PT Stop Time  1105   stim and heat end of session   PT Time Calculation (min)  60 min    Activity Tolerance  Patient tolerated treatment well;Patient limited by pain    Behavior During Therapy  [facility] for tasks assessed/performed       Past Medical History:  Diagnosis Date  . Hypertension   . Myelocytic leukemia, chronic ([place])   . Sleep apnea     History reviewed. No pertinent surgical history.  There were no vitals filed for this visit.  Subjective Assessment - 08/28/18 1013    Subjective  Pt reports she had some improved pain after last session but it only lasted until that evening.  Had 8/10 pain over the weekend.  Did exercises over the weekend and they didn't cause any increased pain.    Limitations  Sitting;Standing;Walking;House hold activities;Other (comment)    How long can you sit comfortably?  1 hour    How long can you stand comfortably?  5 min    How long can you walk comfortably?  5 min, uses 2 wheel walker in community, wheelchair at home to be able to sit     Diagnostic tests  MRI through [facility]    Patient Stated Goals  avoid surgery, improve walking, reduce pain    Currently in Pain?  Yes    Pain Score  5     Pain Location  Buttocks    Pain Orientation  Left    Pain Descriptors / Indicators  Dull;Aching    Pain Type  Chronic pain    Pain Onset  More than a month ago         [facility] PT Assessment - 08/28/18 0001      Observation/Other Assessments   Focus on  Therapeutic Outcomes (FOTO)   68%   goal 53%                  [facility] Adult PT Treatment/Exercise - 08/28/18 0001      Exercises   Exercises  Lumbar;Knee/Hip      Lumbar Exercises: Seated   Other Seated Lumbar Exercises  tall sitting red tband rows bil UEs with band around foot   PT cued for scapular retraction and core engagement   Other Seated Lumbar Exercises  seated sciatic tensioners 10x bil      Knee/Hip Exercises: Seated   Ball Squeeze  20 x 5 sec with core contraction    Marching  Strengthening;AROM;20 reps   PT cued for ab set during   Marching Limitations  added opposite shoulder flexion with marching      Modalities   Modalities  Electrical Stimulation;Moist Heat      Moist Heat Therapy   Number Minutes Moist Heat  10 Minutes    Moist Heat Location  Hip   Left     Electrical Stimulation   Electrical Stimulation Location  Lt hip/buttock    Electrical Stimulation Action  IFC    Electrical Stimulation Parameters  x12 min    Electrical Stimulation Goals  Pain      Manual Therapy   Manual Therapy  Soft tissue mobilization;Manual Traction;Joint mobilization    Joint Mobilization  Left facet gapping L4-S1 Gr III/IV in Lt sidelying    Soft tissue mobilization  Left gluteals after dry needling    Manual Traction  sacral distraction L5/S1 gr III/IV in Lt sidelying       Trigger Point Dry Needling - 08/28/18 1100    Consent Given?  Yes    Education Handout Provided  Yes    Muscles Treated Lower Body  Gluteus minimus;Gluteus maximus   gluteus medius   Gluteus Maximus Response  Twitch response elicited;Palpable increased muscle length    Gluteus Minimus Response  Twitch response elicited;Palpable increased muscle length           PT Education - 08/28/18 1329    Education Details  Access Code: [email or web address]     Person(s) Educated  Patient    Methods  Explanation;Demonstration;Verbal cues;Handout    Comprehension  Verbalized understanding;Returned  demonstration       PT Short Term Goals - 08/23/18 2106      PT SHORT TERM GOAL #1   Title  Pt will be independent in initial HEP.    Time  3    Period  Weeks    Status  New    Target Date  09/13/18      PT SHORT TERM GOAL #2   Title  Pt will report at least 20% improvement in pain throughout the day.    Time  4    Period  Weeks    Status  New    Target Date  09/20/18      PT SHORT TERM GOAL #3   Title  Pt will be able to tolerate standing or walking activity for > or = 10 min with pain rating < or = 6/10.    Time  4    Period  Weeks    Status  New    Target Date  09/20/18        PT Long Term Goals - 08/23/18 2108      PT LONG TERM GOAL #1   Title  Pt will be independent in advanced HEP.    Time  8    Period  Weeks    Status  New    Target Date  10/18/18      PT LONG TERM GOAL #2   Title  Pt will report at least 30% improvement in pain throughout the day.    Time  8    Period  Weeks    Status  New    Target Date  10/18/18      PT LONG TERM GOAL #3   Title  Pt will be able to go up and down one flight of stairs with improved body mechanics to get to and from her bedroom to increase safety and energy conservation.    Time  8    Period  Weeks    Status  New    Target Date  10/18/18      PT LONG TERM GOAL #4   Title  Pt will report incoropartion of 5-10 min of standing or walking tasks out of wheelchair at least 2 times daily with pain rating of < or = 5/10 to increase her mobility and endurance.    Time  8  Period  Weeks    Status  New    Target Date  10/18/18            Plan - 08/28/18 1333    Clinical Impression Statement  Pt able to perform seated exercises today with max cueing by PT for exercise form.  She is a compliant, willing participant in all PT activities but is limited by pain, obesity and deconditioning.  She tolerated dry needling to Lt gluteal muscle group today which was performed due to relief felt last session with maual therapy to  release that area, although the relief did not last beyond that day.  She reports pain relief with sacral distraction and gapping mobiilization to lumbar spine on Lt in Rt sidelying, but she has return of pain in gravity/loading.  Stim and heat were used to see if she might benefit from a home TENS unit and heat at home for pain control.  Pt will benefit from continued skilled progression of PT including manual therapy, lumbopelvic strengthening, functional strengthening to increase her independence and mobility for health and pain control.    History and Personal Factors relevant to plan of care:  obesity, sedentary lifestyle, extensive PMH    Rehab Potential  Good    PT Frequency  2x / week    PT Duration  8 weeks    PT Treatment/Interventions  ADLs/Self Care Home Management;Electrical Stimulation;Cryotherapy;Moist Heat;Traction;Gait training;Stair training;Functional mobility training;Therapeutic activities;Therapeutic exercise;Balance training;Neuromuscular re-education;Patient/family education;Manual techniques;Passive range of motion;Dry needling;Taping;Energy conservation;Joint Manipulations    PT Next Visit Plan  f/u on dry needling, stim, heat.  Recommend home TENS if good response. Continue builing HEP, gait training for exercise, manual therapy.    PT Home Exercise Plan  Access Code: [email or web address]    Consulted and Agree with Plan of Care  Patient;Family member/caregiver    Family Member Consulted  yes       Patient will benefit from skilled therapeutic intervention in order to improve the following deficits and impairments:  Abnormal gait, Decreased range of motion, Difficulty walking, Impaired tone, Cardiopulmonary status limiting activity, Decreased endurance, Increased muscle spasms, Obesity, Decreased activity tolerance, Pain, Hypomobility, Impaired flexibility, Improper body mechanics, Decreased strength, Decreased mobility, Postural dysfunction  Visit Diagnosis: Chronic bilateral low back  pain with bilateral sciatica  Abnormal posture  Muscle weakness (generalized)  Difficulty in walking, not elsewhere classified     Problem List Patient Active Problem List   Diagnosis Date Noted  . Allergic rhinitis due to pollen 08/23/2018  . DDD (degenerative disc disease), lumbar 08/23/2018  . Lumbar neuritis 08/23/2018  . 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  . GERD (gastroesophageal reflux disease) 08/23/2018  . Morbid obesity ([place]) 08/23/2018  . Obstructive sleep apnea 08/23/2018  . Intervertebral disc disorder with radiculopathy of lumbar region 08/23/2018  . CML (chronic myelocytic leukemia) ([place]) 08/23/2018  . Hx of myocardial perfusion scan 08/23/2018  . Osteoarthritis 08/23/2018  . Degenerative arthritis of hip 08/23/2018  . Lumbar spondylosis 08/23/2018  . SOB (shortness of breath) 08/23/2018  . History of CVA (cerebrovascular accident) 08/23/2018  . Essential hypertension 08/23/2018  . Chronic right shoulder pain 08/23/2018  . Complete tear of right rotator cuff 08/23/2018  . Acute pain of left shoulder 08/23/2018  . On antineoplastic chemotherapy 08/23/2018  . Hyperlipidemia 08/23/2018  . Neurogenic claudication due to lumbar spinal stenosis 08/23/2018  . Vitamin D deficiency 08/23/2018   Baruch Merl, PT 08/28/18 1:42 PM   [facility] [address]  [place], [place], [address] Phone: [phone]   Fax:  [phone]  Name: Pamela Cox MRN: [id number] Date of Birth: 1941-02-23

## 2018-09-01 ENCOUNTER — Encounter: Payer: Self-pay | Admitting: Physical Therapy

## 2018-09-01 ENCOUNTER — Ambulatory Visit: Payer: Medicare HMO | Admitting: Physical Therapy

## 2018-09-01 DIAGNOSIS — M5441 Lumbago with sciatica, right side: Principal | ICD-10-CM

## 2018-09-01 DIAGNOSIS — R293 Abnormal posture: Secondary | ICD-10-CM

## 2018-09-01 DIAGNOSIS — R262 Difficulty in walking, not elsewhere classified: Secondary | ICD-10-CM

## 2018-09-01 DIAGNOSIS — M5442 Lumbago with sciatica, left side: Secondary | ICD-10-CM | POA: Diagnosis not present

## 2018-09-01 DIAGNOSIS — M6281 Muscle weakness (generalized): Secondary | ICD-10-CM

## 2018-09-01 DIAGNOSIS — G8929 Other chronic pain: Secondary | ICD-10-CM

## 2018-09-01 NOTE — Therapy (Signed)
[facility] [address] [place], [place], [address] Phone: [phone]   Fax:  [phone]  Physical Therapy Treatment  Patient Details  Name: Pamela Cox MRN: [id number] Date of Birth: 16-Aug-1941 Referring Provider (PT): Edger House, MD   Encounter Date: 09/01/2018  PT End of Session - 09/01/18 0805    Visit Number  4    Date for PT Re-Evaluation  10/18/18    Authorization Type  [employer]    PT Start Time  0758    PT Stop Time  0841    PT Time Calculation (min)  43 min    Activity Tolerance  Patient tolerated treatment well;Patient limited by pain    Behavior During Therapy  [facility] for tasks assessed/performed       Past Medical History:  Diagnosis Date  . Hypertension   . Myelocytic leukemia, chronic ([place])   . Sleep apnea     History reviewed. No pertinent surgical history.  There were no vitals filed for this visit.  Subjective Assessment - 09/01/18 0758    Subjective  Pt said she was quite sore after last visit but did like the dry needling.  She reports she is doing her HEP and it is going well. "I can feel a difference since starting PT."    Limitations  Sitting;Standing;Walking;House hold activities;Other (comment)    Currently in Pain?  Yes    Pain Score  7    getting in car to come to appointment   Pain Location  Hip    Pain Orientation  Left    Pain Descriptors / Indicators  Aching;Dull    Pain Onset  More than a month ago    Pain Frequency  Intermittent                       [facility] Adult PT Treatment/Exercise - 09/01/18 0001      Bed Mobility   Bed Mobility  Sit to Sidelying Left;Rolling Right;Rolling Left;Right Sidelying to Sit;Left Sidelying to Sit   PT provided mod cueing and min assist for sequencing     Exercises   Exercises  Lumbar;Knee/Hip      Lumbar Exercises: Aerobic   Nustep  level 1 x 8 min, legs only   PT present to review symptoms, response to last  treatment     Lumbar Exercises: Standing   Heel Raises  10 reps   2 sets   Functional Squats  10 reps    Other Standing Lumbar Exercises  sidestepping at countertop 10' each way, x 3 sets    Other Standing Lumbar Exercises  marching at countertop x 20 reps, 2 sets   bil UE support     Lumbar Exercises: Seated   Other Seated Lumbar Exercises  tall sitting red tband rows bil UEs with band around foot   PT cued for scapular retraction and core engagement   Other Seated Lumbar Exercises  seated marching with opp arm raise x 20      Lumbar Exercises: Supine   Ab Set  15 reps    AB Set Limitations  with glut set and physioball press   15 reps              PT Short Term Goals - 08/23/18 2106      PT SHORT TERM GOAL #1   Title  Pt will be independent in initial HEP.    Time  3  Period  Weeks    Status  New    Target Date  09/13/18      PT SHORT TERM GOAL #2   Title  Pt will report at least 20% improvement in pain throughout the day.    Time  4    Period  Weeks    Status  New    Target Date  09/20/18      PT SHORT TERM GOAL #3   Title  Pt will be able to tolerate standing or walking activity for > or = 10 min with pain rating < or = 6/10.    Time  4    Period  Weeks    Status  New    Target Date  09/20/18        PT Long Term Goals - 08/23/18 2108      PT LONG TERM GOAL #1   Title  Pt will be independent in advanced HEP.    Time  8    Period  Weeks    Status  New    Target Date  10/18/18      PT LONG TERM GOAL #2   Title  Pt will report at least 30% improvement in pain throughout the day.    Time  8    Period  Weeks    Status  New    Target Date  10/18/18      PT LONG TERM GOAL #3   Title  Pt will be able to go up and down one flight of stairs with improved body mechanics to get to and from her bedroom to increase safety and energy conservation.    Time  8    Period  Weeks    Status  New    Target Date  10/18/18      PT LONG TERM GOAL #4   Title   Pt will report incoropartion of 5-10 min of standing or walking tasks out of wheelchair at least 2 times daily with pain rating of < or = 5/10 to increase her mobility and endurance.    Time  8    Period  Weeks    Status  New    Target Date  10/18/18            Plan - 09/01/18 0844    Clinical Impression Statement  Pt tolerated multiple positions for exercises today with need for brief seated breaks during standing ther ex today due to pain/fatigue.  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  She needs mod-max cueing for core contraciton maintenance during exercises.  PT encouraged her to sprinkle exercises in various positions (HEP includeds supine, seated and standing ther ex) throughout day to encourage frequent change of position and getting out of wheelchair throughout the day.  She will continue to benefit from PT to address strength, endurance and mobility deficits to improve her functional independence and pain.    PT Frequency  2x / week    PT Duration  8 weeks    PT Treatment/Interventions  ADLs/Self Care Home Management;Electrical Stimulation;Cryotherapy;Moist Heat;Traction;Gait training;Stair training;Functional mobility training;Therapeutic activities;Therapeutic exercise;Balance training;Neuromuscular re-education;Patient/family education;Manual techniques;Passive range of motion;Dry needling;Taping;Energy conservation;Joint Manipulations    PT Next Visit Plan  f/u on dry needling, stim, heat.  Recommend home TENS if good response. Continue builing HEP, gait training for exercise, manual therapy.    PT Home Exercise Plan  Access Code: [email or web address]    Consulted and Agree  with Plan of Care  Patient;Family member/caregiver    Family Member Consulted  yes       Patient will benefit from skilled therapeutic intervention in order to improve the following deficits and impairments:  Abnormal gait, Decreased range of motion, Difficulty walking, Impaired tone,  Cardiopulmonary status limiting activity, Decreased endurance, Increased muscle spasms, Obesity, Decreased activity tolerance, Pain, Hypomobility, Impaired flexibility, Improper body mechanics, Decreased strength, Decreased mobility, Postural dysfunction  Visit Diagnosis: Chronic bilateral low back pain with bilateral sciatica  Abnormal posture  Muscle weakness (generalized)  Difficulty in walking, not elsewhere classified     Problem List Patient Active Problem List   Diagnosis Date Noted  . Allergic rhinitis due to pollen 08/23/2018  . DDD (degenerative disc disease), lumbar 08/23/2018  . Lumbar neuritis 08/23/2018  . 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  . GERD (gastroesophageal reflux disease) 08/23/2018  . Morbid obesity ([place]) 08/23/2018  . Obstructive sleep apnea 08/23/2018  . Intervertebral disc disorder with radiculopathy of lumbar region 08/23/2018  . CML (chronic myelocytic leukemia) ([place]) 08/23/2018  . Hx of myocardial perfusion scan 08/23/2018  . Osteoarthritis 08/23/2018  . Degenerative arthritis of hip 08/23/2018  . Lumbar spondylosis 08/23/2018  . SOB (shortness of breath) 08/23/2018  . History of CVA (cerebrovascular accident) 08/23/2018  . Essential hypertension 08/23/2018  . Chronic right shoulder pain 08/23/2018  . Complete tear of right rotator cuff 08/23/2018  . Acute pain of left shoulder 08/23/2018  . On antineoplastic chemotherapy 08/23/2018  . Hyperlipidemia 08/23/2018  . Neurogenic claudication due to lumbar spinal stenosis 08/23/2018  . Vitamin D deficiency 08/23/2018   Baruch Merl, PT 09/01/18 9:18 AM    [facility] [address], [place], [place], [address] Phone: [phone]   Fax:  [phone]  Name: Pamela Cox MRN: [id number] Date of Birth: 1941/05/20

## 2018-09-01 NOTE — Patient Instructions (Signed)
Access Code: [email or web address]  URL: [email or web address]  Date: 09/01/2018  Prepared by: Venetia Night Marlan Steward   Exercises  Hooklying Single Knee to Chest - 10 reps - 2 sets - 5 hold - 1x daily - 7x weekly  Supine Piriformis Stretch with Foot on Ground - 3 reps - 1 sets - 20 hold - 1x daily - 7x weekly  Seated Slump Nerve Glide - 10 reps - 3 sets - 1x daily - 7x weekly  Standing Transverse Abdominis Contraction - 10 reps - 3 sets - 10 hold - 1x daily - 7x weekly  Sidelying Transversus Abdominis Bracing - 10 reps - 3 sets - 10 hold - 1x daily - 7x weekly  Supine Transversus Abdominis Bracing - Hands on Stomach - 10 reps - 3 sets - 10 hold - 1x daily - 7x weekly  Seated Alternating Shoulder Row with Resistance Anchored at Feet - 10 reps - 3 sets - 1x daily - 7x weekly  Seated Hip Adduction Squeeze with Ball - 20 reps - 2 sets - 5 hold - 1x daily - 7x weekly  Seated Marching with Opposite Shoulder Flexion - 20 reps - 2 sets - 1x daily - 7x weekly  Heel rises with counter support - 10 reps - 3 sets - 1x daily - 7x weekly  Side Stepping with Counter Support - 5 reps - 2 sets - 1x daily - 7x weekly  Squat with Counter Support - 10 reps - 3 sets - 1x daily - 7x weekly  Standing Marching - 20 reps - 3 sets - 1x daily - 7x weekly  Hooklying Gluteal Sets - 10 reps - 3 sets - 5 hold - 1x daily - 7x weekly  Patient Education  Rolling From Your Back to Your Side

## 2018-09-05 ENCOUNTER — Ambulatory Visit: Payer: Medicare HMO | Admitting: Physical Therapy

## 2018-09-05 DIAGNOSIS — R262 Difficulty in walking, not elsewhere classified: Secondary | ICD-10-CM

## 2018-09-05 DIAGNOSIS — M5442 Lumbago with sciatica, left side: Principal | ICD-10-CM

## 2018-09-05 DIAGNOSIS — M5441 Lumbago with sciatica, right side: Principal | ICD-10-CM

## 2018-09-05 DIAGNOSIS — M6281 Muscle weakness (generalized): Secondary | ICD-10-CM

## 2018-09-05 DIAGNOSIS — G8929 Other chronic pain: Secondary | ICD-10-CM

## 2018-09-05 DIAGNOSIS — R293 Abnormal posture: Secondary | ICD-10-CM

## 2018-09-05 NOTE — Patient Instructions (Signed)
Access Code: [email or web address]  URL: [email or web address]  Date: 09/05/2018  Prepared by: Sherol Dade   Exercises  Bent Knee Fallouts - 10 reps - 2 sets - 1x daily - 7x weekly  Seated Hip Adduction Squeeze with Ball - 20 reps - 2 sets - 5 hold - 1x daily - 7x weekly  Seated Marching with Opposite Shoulder Flexion - 20 reps - 2 sets - 1x daily - 7x weekly  Heel rises with counter support - 10 reps - 3 sets - 1x daily - 7x weekly  Side Stepping with Counter Support - 5 reps - 2 sets - 1x daily - 7x weekly  Seated Piriformis Stretch - 10 reps - 3 sets - 1x daily - 7x weekly  Patient Education  Rolling From Your Back to [facility] [address] [place], [place] [address] Phone # [phone] Fax [phone]

## 2018-09-05 NOTE — Therapy (Signed)
[facility] [address], [place], [place], [address] Phone: [phone]   Fax:  [phone]  Physical Therapy Treatment  Patient Details  Name: Pamela Cox MRN: [id number] Date of Birth: [birth year] Referring Provider (PT): Edger House, MD   Encounter Date: 09/05/2018  PT End of Session - 09/05/18 0837    Visit Number  5    Date for PT Re-Evaluation  10/18/18    Authorization Type  [employer]na Medicare    PT Start Time  0801    PT Stop Time  2263    PT Time Calculation (min)  43 min    Activity Tolerance  Patient tolerated treatment well;No increased pain    Behavior During Therapy  [facility] for tasks assessed/performed       Past Medical History:  Diagnosis Date  . Hypertension   . Myelocytic leukemia, chronic ([place])   . Sleep apnea     No past surgical history on file.  There were no vitals filed for this visit.  Subjective Assessment - 09/05/18 0808    Subjective  Pt reports that she is able to do some of her exercises but not all of them during the day. No pain currently.     Limitations  Sitting;Standing;Walking;House hold activities;Other (comment)    Currently in Pain?  No/denies    Pain Onset  More than a month ago                       [facility] Adult PT Treatment/Exercise - 09/05/18 0001      Exercises   Exercises  Lumbar      Lumbar Exercises: Aerobic   Nustep  intervals L1 to L3 every minute x4 min       Lumbar Exercises: Standing   Heel Raises  15 reps    Heel Raises Limitations  x2 sets       Lumbar Exercises: Seated   Other Seated Lumbar Exercises  BUE rows with red TB 2x10 reps     Other Seated Lumbar Exercises  hip adductor ball squeeze 10x3 sec hold; BUE each forward/Lt/Rt to improve trunk strength x10 reps       Lumbar Exercises: Supine   Clam  10 reps    Clam Limitations  x2 sets with red TB     Isometric Hip Flexion  10 reps;2 seconds    Isometric Hip Flexion  Limitations  BUE press down     Other Supine Lumbar Exercises  hooklying alternating LE march x10 reps each      Lumbar Exercises: Sidelying   Other Sidelying Lumbar Exercises  thoracic rotation x15 reps each             PT Education - 09/05/18 0837    Education Details  updated HEP    Person(s) Educated  Patient    Methods  Explanation;Handout    Comprehension  Verbalized understanding;Returned demonstration       PT Short Term Goals - 09/05/18 0816      PT SHORT TERM GOAL #1   Title  Pt will be independent in initial HEP.    Time  3    Period  Weeks    Status  Partially Met      PT SHORT TERM GOAL #2   Title  Pt will report at least 20% improvement in pain throughout the day.    Time  4    Period  Weeks    Status  New      PT SHORT TERM GOAL #3   Title  Pt will be able to tolerate standing or walking activity for > or = 10 min with pain rating < or = 6/10.    Time  4    Period  Weeks    Status  New        PT Long Term Goals - 08/23/18 2108      PT LONG TERM GOAL #1   Title  Pt will be independent in advanced HEP.    Time  8    Period  Weeks    Status  New    Target Date  10/18/18      PT LONG TERM GOAL #2   Title  Pt will report at least 30% improvement in pain throughout the day.    Time  8    Period  Weeks    Status  New    Target Date  10/18/18      PT LONG TERM GOAL #3   Title  Pt will be able to go up and down one flight of stairs with improved body mechanics to get to and from her bedroom to increase safety and energy conservation.    Time  8    Period  Weeks    Status  New    Target Date  10/18/18      PT LONG TERM GOAL #4   Title  Pt will report incoropartion of 5-10 min of standing or walking tasks out of wheelchair at least 2 times daily with pain rating of < or = 5/10 to increase her mobility and endurance.    Time  8    Period  Weeks    Status  New    Target Date  10/18/18            Plan - 09/05/18 0828    Clinical  Impression Statement  Pt demonstrates good understanding of proper log roll technique, needing minimal to no cues for this during today's session. She had no pain upon arrival and has been trying to complete some of her HEP throughout the week. Due to pt's difficulty completing all of her exercises throughout the week, therapist decreased the number of exercises to focus on specific areas of weakness/flexibility. Pt verbalized and demonstrated good understanding of HEP updates and reported no increase in low back pain with today's exercises. Will continue with current POC.     PT Frequency  2x / week    PT Duration  8 weeks    PT Treatment/Interventions  ADLs/Self Care Home Management;Electrical Stimulation;Cryotherapy;Moist Heat;Traction;Gait training;Stair training;Functional mobility training;Therapeutic activities;Therapeutic exercise;Balance training;Neuromuscular re-education;Patient/family education;Manual techniques;Passive range of motion;Dry needling;Taping;Energy conservation;Joint Manipulations    PT Next Visit Plan  f/u on dry needling, stim, heat.  Recommend home TENS if good response. Continue builing HEP, gait training for exercise, manual therapy.    PT Home Exercise Plan  Access Code: [email or web address]    Consulted and Agree with Plan of Care  Patient;Family member/caregiver    Family Member Consulted  yes       Patient will benefit from skilled therapeutic intervention in order to improve the following deficits and impairments:  Abnormal gait, Decreased range of motion, Difficulty walking, Impaired tone, Cardiopulmonary status limiting activity, Decreased endurance, Increased muscle spasms, Obesity, Decreased activity tolerance, Pain, Hypomobility, Impaired flexibility, Improper body mechanics, Decreased strength, Decreased mobility, Postural dysfunction  Visit Diagnosis: Chronic bilateral low back pain with bilateral sciatica  Abnormal   posture  Muscle weakness (generalized)  Difficulty  in walking, not elsewhere classified     Problem List Patient Active Problem List   Diagnosis Date Noted  . Allergic rhinitis due to pollen 08/23/2018  . DDD (degenerative disc disease), lumbar 08/23/2018  . Lumbar neuritis 08/23/2018  . 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  . GERD (gastroesophageal reflux disease) 08/23/2018  . Morbid obesity ([place]) 08/23/2018  . Obstructive sleep apnea 08/23/2018  . Intervertebral disc disorder with radiculopathy of lumbar region 08/23/2018  . CML (chronic myelocytic leukemia) ([place]) 08/23/2018  . Hx of myocardial perfusion scan 08/23/2018  . Osteoarthritis 08/23/2018  . Degenerative arthritis of hip 08/23/2018  . Lumbar spondylosis 08/23/2018  . SOB (shortness of breath) 08/23/2018  . History of CVA (cerebrovascular accident) 08/23/2018  . Essential hypertension 08/23/2018  . Chronic right shoulder pain 08/23/2018  . Complete tear of right rotator cuff 08/23/2018  . Acute pain of left shoulder 08/23/2018  . On antineoplastic chemotherapy 08/23/2018  . Hyperlipidemia 08/23/2018  . Neurogenic claudication due to lumbar spinal stenosis 08/23/2018  . Vitamin D deficiency 08/23/2018     8:45 AM,09/05/18 Sherol Dade PT, DPT [place] [address] [place], [place], [address] Phone: [phone]   Fax:  [phone]  Name: Elverda Wendel MRN: [id number] Date of Birth: [birth year]

## 2018-09-08 ENCOUNTER — Ambulatory Visit: Payer: Medicare HMO | Admitting: Physical Therapy

## 2018-09-08 ENCOUNTER — Encounter: Payer: Self-pay | Admitting: Physical Therapy

## 2018-09-08 DIAGNOSIS — R262 Difficulty in walking, not elsewhere classified: Secondary | ICD-10-CM

## 2018-09-08 DIAGNOSIS — R293 Abnormal posture: Secondary | ICD-10-CM

## 2018-09-08 DIAGNOSIS — M6281 Muscle weakness (generalized): Secondary | ICD-10-CM

## 2018-09-08 DIAGNOSIS — M5441 Lumbago with sciatica, right side: Principal | ICD-10-CM

## 2018-09-08 DIAGNOSIS — M5442 Lumbago with sciatica, left side: Principal | ICD-10-CM

## 2018-09-08 DIAGNOSIS — G8929 Other chronic pain: Secondary | ICD-10-CM

## 2018-09-08 NOTE — Therapy (Signed)
[facility] [address], [place], [place], [address] Phone: [phone]   Fax:  [phone]  Physical Therapy Treatment  Patient Details  Name: Pamela Cox MRN: [id number] Date of Birth: [birth year] Referring Provider (PT): Edger House, MD   Encounter Date: 09/08/2018  PT End of Session - 09/08/18 0917    Visit Number  6    Date for PT Re-Evaluation  10/18/18    Authorization Type  [employer] Medicare    PT Start Time  0845    PT Stop Time  0936    PT Time Calculation (min)  51 min    Activity Tolerance  Patient tolerated treatment well       Past Medical History:  Diagnosis Date  . Hypertension   . Myelocytic leukemia, chronic ([place])   . Sleep apnea     History reviewed. No pertinent surgical history.  There were no vitals filed for this visit.  Subjective Assessment - 09/08/18 0845    Subjective  Right much pain following PT until the next day.  It's really sore.   Going for another ESI on Monday.  I really go through it for 2-3 days.      Limitations  Sitting;Standing;Walking;House hold activities;Other (comment)    How long can you walk comfortably?  5 min, uses 2 wheel walker in community, wheelchair at home to be able to sit     Patient Stated Goals  avoid surgery, improve walking, reduce pain    Currently in Pain?  Yes    Pain Score  6     Pain Location  Hip    Pain Orientation  Left    Pain Type  Chronic pain    Aggravating Factors   standing, walking, stairss    Pain Relieving Factors  lying down                       [facility] Adult PT Treatment/Exercise - 09/08/18 0001      Lumbar Exercises: Aerobic   Nustep  L2 x 7 minutes      Lumbar Exercises: Seated   Hip Flexion on Ball Limitations  BUE shoulder extensions 30x     Sit to Stand  10 reps    Sit to Stand Limitations  --   from high table    Other Seated Lumbar Exercises  BUE rows with red TB 2x10 reps     Other Seated  Lumbar Exercises  foam roll push down 15x      Lumbar Exercises: Supine   Ab Set  5 reps;5 seconds   propped on wedge   Clam  10 reps    Clam Limitations  green     Other Supine Lumbar Exercises  propped on wedge hand to opposite knee touch 10x    Other Supine Lumbar Exercises  red ball on legs,pushing both hands against the ball 10x      Moist Heat Therapy   Number Minutes Moist Heat  12 Minutes    Moist Heat Location  Lumbar Spine;Hip      Electrical Stimulation   Electrical Stimulation Location  left hip/buttock     Electrical Stimulation Action  IFC     Electrical Stimulation Parameters  intensity to tolerance 12 min 19 ma supine on wedge    Electrical Stimulation Goals  Pain               PT Short Term Goals - 09/05/18 5035  PT SHORT TERM GOAL #1   Title  Pt will be independent in initial HEP.    Time  3    Period  Weeks    Status  Partially Met      PT SHORT TERM GOAL #2   Title  Pt will report at least 20% improvement in pain throughout the day.    Time  4    Period  Weeks    Status  New      PT SHORT TERM GOAL #3   Title  Pt will be able to tolerate standing or walking activity for > or = 10 min with pain rating < or = 6/10.    Time  4    Period  Weeks    Status  New        PT Long Term Goals - 08/23/18 2108      PT LONG TERM GOAL #1   Title  Pt will be independent in advanced HEP.    Time  8    Period  Weeks    Status  New    Target Date  10/18/18      PT LONG TERM GOAL #2   Title  Pt will report at least 30% improvement in pain throughout the day.    Time  8    Period  Weeks    Status  New    Target Date  10/18/18      PT LONG TERM GOAL #3   Title  Pt will be able to go up and down one flight of stairs with improved body mechanics to get to and from her bedroom to increase safety and energy conservation.    Time  8    Period  Weeks    Status  New    Target Date  10/18/18      PT LONG TERM GOAL #4   Title  Pt will report  incoropartion of 5-10 min of standing or walking tasks out of wheelchair at least 2 times daily with pain rating of < or = 5/10 to increase her mobility and endurance.    Time  8    Period  Weeks    Status  New    Target Date  10/18/18            Plan - 09/08/18 0926    Clinical Impression Statement  Treatment focus on core and LE strengthening in positions with least amount of pain secondary to continued moderate to severe intensity pain.   She is able to perform supine and seated exercises without pain behaviors or complaints of increased pain.  Good initial response to ES/heat.  Therapist closely monitoring response with all treatment interventions and modifying as needed.      Rehab Potential  Good    PT Frequency  2x / week    PT Duration  8 weeks    PT Treatment/Interventions  ADLs/Self Care Home Management;Electrical Stimulation;Cryotherapy;Moist Heat;Traction;Gait training;Stair training;Functional mobility training;Therapeutic activities;Therapeutic exercise;Balance training;Neuromuscular re-education;Patient/family education;Manual techniques;Passive range of motion;Dry needling;Taping;Energy conservation;Joint Manipulations    PT Next Visit Plan  see how 3rd ESI went;  assess response after today's session without standing ex  (less pain?);  assess progress toward goals next visit;  Continue builing HEP, gait training for exercise, manual therapy.    PT Home Exercise Plan  Access Code: [email or web address]       Patient will benefit from skilled therapeutic intervention in order to improve the following deficits and impairments:  Abnormal gait, Decreased range of motion, Difficulty walking, Impaired tone, Cardiopulmonary status limiting activity, Decreased endurance, Increased muscle spasms, Obesity, Decreased activity tolerance, Pain, Hypomobility, Impaired flexibility, Improper body mechanics, Decreased strength, Decreased mobility, Postural dysfunction  Visit Diagnosis: Chronic bilateral  low back pain with bilateral sciatica  Abnormal posture  Muscle weakness (generalized)  Difficulty in walking, not elsewhere classified     Problem List Patient Active Problem List   Diagnosis Date Noted  . Allergic rhinitis due to pollen 08/23/2018  . DDD (degenerative disc disease), lumbar 08/23/2018  . Lumbar neuritis 08/23/2018  . 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  . GERD (gastroesophageal reflux disease) 08/23/2018  . Morbid obesity ([place]) 08/23/2018  . Obstructive sleep apnea 08/23/2018  . Intervertebral disc disorder with radiculopathy of lumbar region 08/23/2018  . CML (chronic myelocytic leukemia) ([place]) 08/23/2018  . Hx of myocardial perfusion scan 08/23/2018  . Osteoarthritis 08/23/2018  . Degenerative arthritis of hip 08/23/2018  . Lumbar spondylosis 08/23/2018  . SOB (shortness of breath) 08/23/2018  . History of CVA (cerebrovascular accident) 08/23/2018  . Essential hypertension 08/23/2018  . Chronic right shoulder pain 08/23/2018  . Complete tear of right rotator cuff 08/23/2018  . Acute pain of left shoulder 08/23/2018  . On antineoplastic chemotherapy 08/23/2018  . Hyperlipidemia 08/23/2018  . Neurogenic claudication due to lumbar spinal stenosis 08/23/2018  . Vitamin D deficiency 08/23/2018   Ruben Im, PT 09/08/18 11:29 AM Phone: [phone] Fax: [phone]  Alvera Singh 09/08/2018, 11:28 AM  [facility] [address], [place], [place], [address] Phone: [phone]   Fax:  [phone]  Name: Pamela Cox MRN: [id number] Date of Birth: 1941/07/25

## 2018-09-11 ENCOUNTER — Encounter: Payer: Medicare HMO | Admitting: Physical Therapy

## 2018-09-15 ENCOUNTER — Ambulatory Visit: Payer: Medicare HMO | Admitting: Physical Therapy

## 2018-09-18 ENCOUNTER — Ambulatory Visit: Payer: Medicare HMO | Admitting: Physical Therapy

## 2018-09-18 ENCOUNTER — Encounter: Payer: Self-pay | Admitting: Physical Therapy

## 2018-09-18 DIAGNOSIS — R293 Abnormal posture: Secondary | ICD-10-CM

## 2018-09-18 DIAGNOSIS — M6281 Muscle weakness (generalized): Secondary | ICD-10-CM

## 2018-09-18 DIAGNOSIS — M5442 Lumbago with sciatica, left side: Secondary | ICD-10-CM | POA: Diagnosis not present

## 2018-09-18 DIAGNOSIS — G8929 Other chronic pain: Secondary | ICD-10-CM

## 2018-09-18 DIAGNOSIS — M5441 Lumbago with sciatica, right side: Principal | ICD-10-CM

## 2018-09-18 DIAGNOSIS — R262 Difficulty in walking, not elsewhere classified: Secondary | ICD-10-CM

## 2018-09-18 NOTE — Therapy (Signed)
[facility] [address] [place], [place], [address] Phone: [phone]   Fax:  [phone]  Physical Therapy Treatment  Patient Details  Name: Pamela Cox MRN: [id number] Date of Birth: 02-Feb-1941 Referring Provider (PT): Edger House, MD   Encounter Date: 09/18/2018  PT End of Session - 09/18/18 0926    Visit Number  7    Date for PT Re-Evaluation  10/18/18    Authorization Type  [employer]na Medicare    PT Start Time  0845    PT Stop Time  0925    PT Time Calculation (min)  40 min    Activity Tolerance  Patient tolerated treatment well;No increased pain    Behavior During Therapy  WFL for tasks assessed/performed       Past Medical History:  Diagnosis Date  . Hypertension   . Myelocytic leukemia, chronic ([place])   . Sleep apnea     History reviewed. No pertinent surgical history.  There were no vitals filed for this visit.  Subjective Assessment - 09/18/18 0850    Subjective  Pt reports that she got a shot in her back last week. This was really sore following, but now it's not too bad. She has been trying to complete her HEP.     Limitations  Sitting;Standing;Walking;House hold activities;Other (comment)    How long can you walk comfortably?  5 min, uses 2 wheel walker in community, wheelchair at home to be able to sit     Patient Stated Goals  avoid surgery, improve walking, reduce pain    Currently in Pain?  No/denies                       [facility] Adult PT Treatment/Exercise - 09/18/18 0001      Exercises   Exercises  Lumbar      Lumbar Exercises: Aerobic   Nustep  intervals x7 min, L2 to L4 every 60 sec      Lumbar Exercises: Seated   Hip Flexion on Ball  Both;10 reps   x2 sets on firm surface   Other Seated Lumbar Exercises  BUE rows with red TB 2x15 reps; B adductor ball squeeze x20 reps     Other Seated Lumbar Exercises  foam roll push down 15x      Lumbar Exercises: Supine    Clam  15 reps    Clam Limitations  x2 sets, green TB    Other Supine Lumbar Exercises  low trunk rotation 2x10 reps each direction with LE on red physioball; active B knees to chest with LE on red physioball 2x10 reps              PT Education - 09/18/18 0915    Education Details  technique with therex    Person(s) Educated  Patient    Methods  Explanation;Verbal cues    Comprehension  Verbalized understanding;Returned demonstration       PT Short Term Goals - 09/18/18 0915      PT SHORT TERM GOAL #1   Title  Pt will be independent in initial HEP.    Time  3    Period  Weeks    Status  Partially Met      PT SHORT TERM GOAL #2   Title  Pt will report at least 20% improvement in pain throughout the day.    Time  4    Period  Weeks    Status  New  PT SHORT TERM GOAL #3   Title  Pt will be able to tolerate standing or walking activity for > or = 10 min with pain rating < or = 6/10.    Baseline  pt states she is out of her wheel chair more during the day, maybe up to 10 min.     Time  4    Period  Weeks    Status  Achieved        PT Long Term Goals - 08/23/18 2108      PT LONG TERM GOAL #1   Title  Pt will be independent in advanced HEP.    Time  8    Period  Weeks    Status  New    Target Date  10/18/18      PT LONG TERM GOAL #2   Title  Pt will report at least 30% improvement in pain throughout the day.    Time  8    Period  Weeks    Status  New    Target Date  10/18/18      PT LONG TERM GOAL #3   Title  Pt will be able to go up and down one flight of stairs with improved body mechanics to get to and from her bedroom to increase safety and energy conservation.    Time  8    Period  Weeks    Status  New    Target Date  10/18/18      PT LONG TERM GOAL #4   Title  Pt will report incoropartion of 5-10 min of standing or walking tasks out of wheelchair at least 2 times daily with pain rating of < or = 5/10 to increase her mobility and endurance.     Time  8    Period  Weeks    Status  New    Target Date  10/18/18            Plan - 09/18/18 5852    Clinical Impression Statement  Pt is making steady progress towards goals, reporting improvements in pain and strength since beginning PT. She is currently able to do more around her home outside of the wheelchair and notes up to 10 min of being on her feet before she has to sit down due to pain. Continued with therex to promote trunk strength and endurance. She was able t complete all progressions in repetitions and sets without any increase in pain reported. Will continue with current POC.     Rehab Potential  Good    PT Frequency  2x / week    PT Duration  8 weeks    PT Treatment/Interventions  ADLs/Self Care Home Management;Electrical Stimulation;Cryotherapy;Moist Heat;Traction;Gait training;Stair training;Functional mobility training;Therapeutic activities;Therapeutic exercise;Balance training;Neuromuscular re-education;Patient/family education;Manual techniques;Passive range of motion;Dry needling;Taping;Energy conservation;Joint Manipulations    PT Next Visit Plan  conitnue with more standing therex to improve endurance, etc.;  Continue builing HEP, gait training for exercise, manual therapy.    PT Home Exercise Plan  Access Code: [email or web address]       Patient will benefit from skilled therapeutic intervention in order to improve the following deficits and impairments:  Abnormal gait, Decreased range of motion, Difficulty walking, Impaired tone, Cardiopulmonary status limiting activity, Decreased endurance, Increased muscle spasms, Obesity, Decreased activity tolerance, Pain, Hypomobility, Impaired flexibility, Improper body mechanics, Decreased strength, Decreased mobility, Postural dysfunction  Visit Diagnosis: Chronic bilateral low back pain with bilateral sciatica  Abnormal posture  Muscle weakness (generalized)  Difficulty in walking, not elsewhere classified     Problem  List Patient Active Problem List   Diagnosis Date Noted  . Allergic rhinitis due to pollen 08/23/2018  . DDD (degenerative disc disease), lumbar 08/23/2018  . Lumbar neuritis 08/23/2018  . 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  . GERD (gastroesophageal reflux disease) 08/23/2018  . Morbid obesity ([place]) 08/23/2018  . Obstructive sleep apnea 08/23/2018  . Intervertebral disc disorder with radiculopathy of lumbar region 08/23/2018  . CML (chronic myelocytic leukemia) ([place]) 08/23/2018  . Hx of myocardial perfusion scan 08/23/2018  . Osteoarthritis 08/23/2018  . Degenerative arthritis of hip 08/23/2018  . Lumbar spondylosis 08/23/2018  . SOB (shortness of breath) 08/23/2018  . History of CVA (cerebrovascular accident) 08/23/2018  . Essential hypertension 08/23/2018  . Chronic right shoulder pain 08/23/2018  . Complete tear of right rotator cuff 08/23/2018  . Acute pain of left shoulder 08/23/2018  . On antineoplastic chemotherapy 08/23/2018  . Hyperlipidemia 08/23/2018  . Neurogenic claudication due to lumbar spinal stenosis 08/23/2018  . Vitamin D deficiency 08/23/2018    9:32 AM,09/18/18 Sherol Dade PT, DPT [facility] [address] [place], [place], [address] Phone: [phone]   Fax:  [phone]  Name: Pamela Cox MRN: [id number] Date of Birth: 02-28-1941

## 2018-09-22 ENCOUNTER — Ambulatory Visit: Payer: Medicare HMO | Attending: Physical Medicine and Rehabilitation | Admitting: Physical Therapy

## 2018-09-22 ENCOUNTER — Encounter: Payer: Self-pay | Admitting: Physical Therapy

## 2018-09-22 DIAGNOSIS — M5442 Lumbago with sciatica, left side: Secondary | ICD-10-CM | POA: Diagnosis present

## 2018-09-22 DIAGNOSIS — G8929 Other chronic pain: Secondary | ICD-10-CM | POA: Diagnosis present

## 2018-09-22 DIAGNOSIS — R293 Abnormal posture: Secondary | ICD-10-CM

## 2018-09-22 DIAGNOSIS — R262 Difficulty in walking, not elsewhere classified: Secondary | ICD-10-CM | POA: Diagnosis present

## 2018-09-22 DIAGNOSIS — M5441 Lumbago with sciatica, right side: Secondary | ICD-10-CM | POA: Diagnosis present

## 2018-09-22 DIAGNOSIS — M6281 Muscle weakness (generalized): Secondary | ICD-10-CM | POA: Diagnosis present

## 2018-09-22 NOTE — Therapy (Signed)
[facility] [address] [place], [place], [address] Phone: [phone]   Fax:  [phone]  Physical Therapy Treatment  Patient Details  Name: Pamela Cox MRN: [id number] Date of Birth: [birth year] Referring Provider (PT): Edger House, MD   Encounter Date: 09/22/2018  PT End of Session - 09/22/18 0921    Visit Number  8    Date for PT Re-Evaluation  10/18/18    Authorization Type  [employer]na Medicare    PT Start Time  0846    PT Stop Time  0925    PT Time Calculation (min)  39 min    Activity Tolerance  Patient tolerated treatment well       Past Medical History:  Diagnosis Date  . Hypertension   . Myelocytic leukemia, chronic ([place])   . Sleep apnea     History reviewed. No pertinent surgical history.  There were no vitals filed for this visit.  Subjective Assessment - 09/22/18 0850    Subjective  A little pain in my hip but I took my Gabapentin.  Sore right after PT but it doesn't last long.  I'm walking a little bit better.  I'm getting better.      Currently in Pain?  No/denies    Pain Score  0-No pain    Pain Location  Back    Aggravating Factors   walking, standing                       [facility] Adult PT Treatment/Exercise - 09/22/18 0001      Lumbar Exercises: Stretches   Active Hamstring Stretch  Right;Left;5 reps   seated     Lumbar Exercises: Aerobic   Nustep  Seat 11 L2 8 min       Lumbar Exercises: Seated   Long Arc Quad on Chair  Strengthening;Right;Left;15 reps    LAQ on Chair Weights (lbs)  2    Hip Flexion on Ball  Strengthening;Right;Left;15 reps    Hip Flexion on Ball Limitations  2# weights     Sit to Stand  10 reps    Other Seated Lumbar Exercises  bil rows and bil shoulder extensions  red band 20x     Other Seated Lumbar Exercises  foam roll push down 15x      Lumbar Exercises: Supine   Ab Set  5 reps;5 seconds   propped on wedge   Clam  10 reps    Clam  Limitations  green     Bent Knee Raise Limitations  march with green band around thighs 10x     Bridge  10 reps    Other Supine Lumbar Exercises  propped on wedge hand to opposite knee touch 10x    Other Supine Lumbar Exercises  red ball on legs,pushing both hands against the ball 10x      Moist Heat Therapy   Number Minutes Moist Heat  15 Minutes   concurrent with ex   Moist Heat Location  Lumbar Spine               PT Short Term Goals - 09/22/18 1216      PT SHORT TERM GOAL #1   Title  Pt will be independent in initial HEP.    Time  3    Period  Weeks    Status  Partially Met      PT SHORT TERM GOAL #2   Title  Pt will report  at least 20% improvement in pain throughout the day.    Time  4    Period  Weeks    Status  On-going      PT SHORT TERM GOAL #3   Title  Pt will be able to tolerate standing or walking activity for > or = 10 min with pain rating < or = 6/10.    Status  Achieved        PT Long Term Goals - 08/23/18 2108      PT LONG TERM GOAL #1   Title  Pt will be independent in advanced HEP.    Time  8    Period  Weeks    Status  New    Target Date  10/18/18      PT LONG TERM GOAL #2   Title  Pt will report at least 30% improvement in pain throughout the day.    Time  8    Period  Weeks    Status  New    Target Date  10/18/18      PT LONG TERM GOAL #3   Title  Pt will be able to go up and down one flight of stairs with improved body mechanics to get to and from her bedroom to increase safety and energy conservation.    Time  8    Period  Weeks    Status  New    Target Date  10/18/18      PT LONG TERM GOAL #4   Title  Pt will report incoropartion of 5-10 min of standing or walking tasks out of wheelchair at least 2 times daily with pain rating of < or = 5/10 to increase her mobility and endurance.    Time  8    Period  Weeks    Status  New    Target Date  10/18/18            Plan - 09/22/18 0932    Clinical Impression Statement   The patient reports less overall pain recently and is able to perform increased intensity in supine and seated with minimal hip discomfort reported.  Anticipate she can resume some standing exercises next visit now that her pain is under better control.  Fewer cues needed to avoid holding her breath.  Therapist closely monitoring response to exercise.      Rehab Potential  Good    PT Frequency  2x / week    PT Duration  8 weeks    PT Treatment/Interventions  ADLs/Self Care Home Management;Electrical Stimulation;Cryotherapy;Moist Heat;Traction;Gait training;Stair training;Functional mobility training;Therapeutic activities;Therapeutic exercise;Balance training;Neuromuscular re-education;Patient/family education;Manual techniques;Passive range of motion;Dry needling;Taping;Energy conservation;Joint Manipulations    PT Next Visit Plan  check remaining STGs;  resume standing therex to improve endurance, etc.;  HEP progression (patient has 3# weights at home), gait training for exercise;  modalities as needed    PT Home Exercise Plan  Access Code: [email or web address]       Patient will benefit from skilled therapeutic intervention in order to improve the following deficits and impairments:  Abnormal gait, Decreased range of motion, Difficulty walking, Impaired tone, Cardiopulmonary status limiting activity, Decreased endurance, Increased muscle spasms, Obesity, Decreased activity tolerance, Pain, Hypomobility, Impaired flexibility, Improper body mechanics, Decreased strength, Decreased mobility, Postural dysfunction  Visit Diagnosis: Chronic bilateral low back pain with bilateral sciatica  Abnormal posture  Muscle weakness (generalized)  Difficulty in walking, not elsewhere classified     Problem List Patient Active Problem List  Diagnosis Date Noted  . Allergic rhinitis due to pollen 08/23/2018  . DDD (degenerative disc disease), lumbar 08/23/2018  . Lumbar neuritis 08/23/2018  . Dysthymic disorder  08/23/2018  . GERD (gastroesophageal reflux disease) 08/23/2018  . Morbid obesity ([place]) 08/23/2018  . Obstructive sleep apnea 08/23/2018  . Intervertebral disc disorder with radiculopathy of lumbar region 08/23/2018  . CML (chronic myelocytic leukemia) ([place]) 08/23/2018  . Hx of myocardial perfusion scan 08/23/2018  . Osteoarthritis 08/23/2018  . Degenerative arthritis of hip 08/23/2018  . Lumbar spondylosis 08/23/2018  . SOB (shortness of breath) 08/23/2018  . History of CVA (cerebrovascular accident) 08/23/2018  . Essential hypertension 08/23/2018  . Chronic right shoulder pain 08/23/2018  . Complete tear of right rotator cuff 08/23/2018  . Acute pain of left shoulder 08/23/2018  . On antineoplastic chemotherapy 08/23/2018  . Hyperlipidemia 08/23/2018  . Neurogenic claudication due to lumbar spinal stenosis 08/23/2018  . Vitamin D deficiency 08/23/2018   Ruben Im, PT 09/22/18 12:18 PM Phone: [phone] Fax: [phone]  Alvera Singh 09/22/2018, 12:17 PM  [facility] [address] [place], [place], [address] Phone: [phone]   Fax:  [phone]  Name: Matalynn Graff MRN: [id number] Date of Birth: [birth year]

## 2018-09-25 ENCOUNTER — Encounter: Payer: Self-pay | Admitting: Physical Therapy

## 2018-09-25 ENCOUNTER — Ambulatory Visit: Payer: Medicare HMO | Admitting: Physical Therapy

## 2018-09-25 DIAGNOSIS — M5442 Lumbago with sciatica, left side: Principal | ICD-10-CM

## 2018-09-25 DIAGNOSIS — G8929 Other chronic pain: Secondary | ICD-10-CM

## 2018-09-25 DIAGNOSIS — M6281 Muscle weakness (generalized): Secondary | ICD-10-CM

## 2018-09-25 DIAGNOSIS — R262 Difficulty in walking, not elsewhere classified: Secondary | ICD-10-CM

## 2018-09-25 DIAGNOSIS — M5441 Lumbago with sciatica, right side: Principal | ICD-10-CM

## 2018-09-25 DIAGNOSIS — R293 Abnormal posture: Secondary | ICD-10-CM

## 2018-09-25 NOTE — Therapy (Signed)
[facility] [address], [place], [place], [address] Phone: [phone]   Fax:  [phone]  Physical Therapy Treatment  Patient Details  Name: Pamela Cox MRN: [id number] Date of Birth: 08-03-1941 Referring Provider (PT): Edger House, MD   Encounter Date: 09/25/2018  PT End of Session - 09/25/18 0926    Visit Number  9    Date for PT Re-Evaluation  10/18/18    Authorization Type  [employer] Medicare    PT Start Time  [phone]    PT Stop Time  0927    PT Time Calculation (min)  44 min    Activity Tolerance  Patient tolerated treatment well    Behavior During Therapy  [facility] for tasks assessed/performed       Past Medical History:  Diagnosis Date  . Hypertension   . Myelocytic leukemia, chronic ([place])   . Sleep apnea     History reviewed. No pertinent surgical history.  There were no vitals filed for this visit.  Subjective Assessment - 09/25/18 0926    Subjective  No pain today.  Pt arrives without walker and reports less use of it at home.    Limitations  Sitting;Standing;Walking;House hold activities;Other (comment)    How long can you sit comfortably?  1 hour    How long can you stand comfortably?  10 min    How long can you walk comfortably?  5 min, uses 2 wheel walker in community, wheelchair at home to be able to sit     Diagnostic tests  MRI through [facility]    Patient Stated Goals  avoid surgery, improve walking, reduce pain    Currently in Pain?  No/denies                       [facility] Adult PT Treatment/Exercise - 09/25/18 0001      Exercises   Exercises  Lumbar;Knee/Hip      Lumbar Exercises: Aerobic   Nustep  Seat 11 L2 10 min       Lumbar Exercises: Standing   Heel Raises  2 seconds;15 reps   bil with UE support   Other Standing Lumbar Exercises  sit to stand x 10 reps    Other Standing Lumbar Exercises  hip abduction toe touches laterally with bil UE support x 10 reps bil       Lumbar Exercises: Supine   Ab Set  10 reps   10 sec each   AB Set Limitations  red physioball press with glut set    Glut Set  10 reps   10 sec with UE ball press for ab set in supine   Clam  20 reps    Clam Limitations  red tband, PT cued eccentric control    Bridge  15 reps      Knee/Hip Exercises: Standing   Forward Step Up  5 reps    Forward Step Up Limitations  lead with each foot x 5 reps, use of bil UEs       Knee/Hip Exercises: Seated   Long Arc Quad  Strengthening;Both;15 reps;1 set    [employer] Weight  2 lbs.    Marching  Strengthening;15 reps;Both;2 sets    Marching Limitations  2      Moist Heat Therapy   Number Minutes Moist Heat  10 Minutes    Moist Heat Location  Lumbar Spine   with supine ther ex  PT Short Term Goals - 09/25/18 0927      PT SHORT TERM GOAL #1   Title  Pt will be independent in initial HEP.    Time  3    Period  Weeks    Status  Achieved      PT SHORT TERM GOAL #2   Title  Pt will report at least 20% improvement in pain throughout the day.    Time  4    Period  Weeks    Status  Achieved      PT SHORT TERM GOAL #3   Title  Pt will be able to tolerate standing or walking activity for > or = 10 min with pain rating < or = 6/10.    Time  4    Period  Weeks    Status  Achieved        PT Long Term Goals - 08/23/18 2108      PT LONG TERM GOAL #1   Title  Pt will be independent in advanced HEP.    Time  8    Period  Weeks    Status  New    Target Date  10/18/18      PT LONG TERM GOAL #2   Title  Pt will report at least 30% improvement in pain throughout the day.    Time  8    Period  Weeks    Status  New    Target Date  10/18/18      PT LONG TERM GOAL #3   Title  Pt will be able to go up and down one flight of stairs with improved body mechanics to get to and from her bedroom to increase safety and energy conservation.    Time  8    Period  Weeks    Status  New    Target Date  10/18/18      PT  LONG TERM GOAL #4   Title  Pt will report incoropartion of 5-10 min of standing or walking tasks out of wheelchair at least 2 times daily with pain rating of < or = 5/10 to increase her mobility and endurance.    Time  8    Period  Weeks    Status  New    Target Date  10/18/18            Plan - 09/25/18 5784    Clinical Impression Statement  Pt tolerated mix of positions including brief standing ther ex today with no increase in pain.  She needed cueing for eccentric control during theraband exercises for LEs.  She arrived without walker and states she is doing more walking without it due to feeling stronger and less painful in standing.  She reports stairs within home are getting easier.  She continues to need min cueing for ther ex but is motivated and often performs more reps than asked by PT.  PT had patient She will continue to benefit from skilled PT along her POC to address strength, endurance and functional activity.    Rehab Potential  Good    PT Frequency  2x / week    PT Duration  8 weeks    PT Treatment/Interventions  ADLs/Self Care Home Management;Electrical Stimulation;Cryotherapy;Moist Heat;Traction;Gait training;Stair training;Functional mobility training;Therapeutic activities;Therapeutic exercise;Balance training;Neuromuscular re-education;Patient/family education;Manual techniques;Passive range of motion;Dry needling;Taping;Energy conservation;Joint Manipulations    PT Next Visit Plan  continue increasing standing ther ex as tolerated    PT Home Exercise Plan  Access Code: [email or web address]    Consulted and Agree with Plan of Care  Patient;Family member/caregiver       Patient will benefit from skilled therapeutic intervention in order to improve the following deficits and impairments:  Abnormal gait, Decreased range of motion, Difficulty walking, Impaired tone, Cardiopulmonary status limiting activity, Decreased endurance, Increased muscle spasms, Obesity, Decreased activity  tolerance, Pain, Hypomobility, Impaired flexibility, Improper body mechanics, Decreased strength, Decreased mobility, Postural dysfunction  Visit Diagnosis: Chronic bilateral low back pain with bilateral sciatica  Abnormal posture  Muscle weakness (generalized)  Difficulty in walking, not elsewhere classified     Problem List Patient Active Problem List   Diagnosis Date Noted  . Allergic rhinitis due to pollen 08/23/2018  . DDD (degenerative disc disease), lumbar 08/23/2018  . Lumbar neuritis 08/23/2018  . 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  . GERD (gastroesophageal reflux disease) 08/23/2018  . Morbid obesity ([place]) 08/23/2018  . Obstructive sleep apnea 08/23/2018  . Intervertebral disc disorder with radiculopathy of lumbar region 08/23/2018  . CML (chronic myelocytic leukemia) ([place]) 08/23/2018  . Hx of myocardial perfusion scan 08/23/2018  . Osteoarthritis 08/23/2018  . Degenerative arthritis of hip 08/23/2018  . Lumbar spondylosis 08/23/2018  . SOB (shortness of breath) 08/23/2018  . History of CVA (cerebrovascular accident) 08/23/2018  . Essential hypertension 08/23/2018  . Chronic right shoulder pain 08/23/2018  . Complete tear of right rotator cuff 08/23/2018  . Acute pain of left shoulder 08/23/2018  . On antineoplastic chemotherapy 08/23/2018  . Hyperlipidemia 08/23/2018  . Neurogenic claudication due to lumbar spinal stenosis 08/23/2018  . Vitamin D deficiency 08/23/2018   Baruch Merl, PT 09/25/18 9:29 AM   [facility] [address], [place], [place], [address] Phone: [phone]   Fax:  [phone]  Name: Perrin Eddleman MRN: [id number] Date of Birth: October 12, 1941

## 2018-09-29 ENCOUNTER — Encounter: Payer: Self-pay | Admitting: Physical Therapy

## 2018-09-29 ENCOUNTER — Ambulatory Visit: Payer: Medicare HMO | Admitting: Physical Therapy

## 2018-09-29 DIAGNOSIS — M5442 Lumbago with sciatica, left side: Secondary | ICD-10-CM | POA: Diagnosis not present

## 2018-09-29 DIAGNOSIS — M5441 Lumbago with sciatica, right side: Principal | ICD-10-CM

## 2018-09-29 DIAGNOSIS — G8929 Other chronic pain: Secondary | ICD-10-CM

## 2018-09-29 DIAGNOSIS — M6281 Muscle weakness (generalized): Secondary | ICD-10-CM

## 2018-09-29 DIAGNOSIS — R262 Difficulty in walking, not elsewhere classified: Secondary | ICD-10-CM

## 2018-09-29 DIAGNOSIS — R293 Abnormal posture: Secondary | ICD-10-CM

## 2018-09-29 NOTE — Therapy (Signed)
[facility] [address] [place], [place], [address] Phone: [phone]   Fax:  [phone]  Physical Therapy Treatment  Patient Details  Name: Pamela Cox MRN: [id number] Date of Birth: 12-25-1940 Referring Provider (PT): Edger House, MD  Progress Note Reporting Period 08/23/18 to 09/29/18  See note below for Objective Data and Assessment of Progress/Goals.          Encounter Date: 09/29/2018  PT End of Session - 09/29/18 0920    Visit Number  10    Date for PT Re-Evaluation  10/18/18    Authorization Type  Aetna Medicare    PT Start Time  0845    PT Stop Time  0923    PT Time Calculation (min)  38 min    Activity Tolerance  Patient tolerated treatment well       Past Medical History:  Diagnosis Date  . Hypertension   . Myelocytic leukemia, chronic ([place])   . Sleep apnea     History reviewed. No pertinent surgical history.  There were no vitals filed for this visit.  Subjective Assessment - 09/29/18 0847    Subjective  "This (Nu-Step) worked the snot out of me." Reports a little bit of pain after last visit.      How long can you stand comfortably?  10 min    Currently in Pain?  Yes    Pain Score  7     Pain Location  Hip    Pain Orientation  Left    Pain Type  Chronic pain         [facility] PT Assessment - 09/29/18 0001      Observation/Other Assessments   Focus on Therapeutic Outcomes (FOTO)   44% limitation       AROM   Lumbar Flexion  60   fingertips to ankles   Lumbar Extension  15    Lumbar - Right Side Bend  20    Lumbar - Left Side Bend  20      Strength   Right Hip Flexion  4/5    Right Hip Extension  4/5    Right Hip External Rotation   4/5    Right Hip Internal Rotation  4/5    Right Hip ABduction  3+/5    Right Hip ADduction  4/5    Left Hip Flexion  4/5    Left Hip Extension  4/5    Left Hip External Rotation  4/5    Left Hip Internal Rotation  4/5    Left Hip  ABduction  3+/5    Left Hip ADduction  4/5                   [facility] Adult PT Treatment/Exercise - 09/29/18 0001      Lumbar Exercises: Aerobic   Nustep  Seat 11 L2 10 min       Lumbar Exercises: Standing   Heel Raises  10 reps    Heel Raises Limitations  bil UE support      Lumbar Exercises: Seated   Other Seated Lumbar Exercises  bil rows and bil shoulder extensions  red band 20x     Other Seated Lumbar Exercises  foam roll push down 15x      Knee/Hip Exercises: Standing   Hip Abduction  AROM;Right;Left;2 sets;5 sets    Hip Extension  AROM;Right;Left;2 sets;5 reps    Other Standing Knee Exercises  step taps 10x right/left  Knee/Hip Exercises: Seated   Long Arc Quad  Strengthening;Both;15 reps;1 set    [employer] Weight  2 lbs.    Ball Squeeze  20x    Clamshell with TheraBand  Red   20x   Marching  Strengthening;Both;1 set;15 reps    Marching Limitations  2    Sit to [employer]  10 reps   from chair plus black foam     Moist Heat Therapy   Number Minutes Moist Heat  15 Minutes   concurrent with Nu-Step and seated ex   Moist Heat Location  Lumbar Spine               PT Short Term Goals - 09/25/18 0927      PT SHORT TERM GOAL #1   Title  Pt will be independent in initial HEP.    Time  3    Period  Weeks    Status  Achieved      PT SHORT TERM GOAL #2   Title  Pt will report at least 20% improvement in pain throughout the day.    Time  4    Period  Weeks    Status  Achieved      PT SHORT TERM GOAL #3   Title  Pt will be able to tolerate standing or walking activity for > or = 10 min with pain rating < or = 6/10.    Time  4    Period  Weeks    Status  Achieved        PT Long Term Goals - 09/29/18 1138      PT LONG TERM GOAL #1   Title  Pt will be independent in advanced HEP.    Time  8    Period  Weeks    Status  On-going      PT LONG TERM GOAL #2   Title  Pt will report at least 30% improvement in pain throughout the day.     Status  Achieved      PT LONG TERM GOAL #3   Title  Pt will be able to go up and down one flight of stairs with improved body mechanics to get to and from her bedroom to increase safety and energy conservation.    Time  8    Period  Weeks    Status  On-going      PT LONG TERM GOAL #4   Title  Pt will report incoropartion of 5-10 min of standing or walking tasks out of wheelchair at least 2 times daily with pain rating of < or = 5/10 to increase her mobility and endurance.    Time  8    Period  Weeks    Status  On-going            Plan - 09/29/18 1133    Clinical Impression Statement  The patient is able to perform some brief standing exercises but needs to sit between each exercise.  She reports she tends to "feel it later" after therapy so therapist was closely monitoring her response and repetitions kept low while standing.  She has much improved lumbar ROM and LE strength.  Her FOTO functional outcome score has improved significantly since initial evaluation.  Patient reports she is 30% better overall.  She is progressing with rehab goals.  Recommend continued PT for further improvements in strength, endurance and functional activity.      Rehab Potential  Good  PT Frequency  2x / week    PT Duration  8 weeks    PT Treatment/Interventions  ADLs/Self Care Home Management;Electrical Stimulation;Cryotherapy;Moist Heat;Traction;Gait training;Stair training;Functional mobility training;Therapeutic activities;Therapeutic exercise;Balance training;Neuromuscular re-education;Patient/family education;Manual techniques;Passive range of motion;Dry needling;Taping;Energy conservation;Joint Manipulations    PT Next Visit Plan  continue increasing standing ther ex as tolerated    PT Home Exercise Plan  Access Code: [email or web address]       Patient will benefit from skilled therapeutic intervention in order to improve the following deficits and impairments:  Abnormal gait, Decreased range of motion,  Difficulty walking, Impaired tone, Cardiopulmonary status limiting activity, Decreased endurance, Increased muscle spasms, Obesity, Decreased activity tolerance, Pain, Hypomobility, Impaired flexibility, Improper body mechanics, Decreased strength, Decreased mobility, Postural dysfunction  Visit Diagnosis: Chronic bilateral low back pain with bilateral sciatica  Abnormal posture  Muscle weakness (generalized)  Difficulty in walking, not elsewhere classified     Problem List Patient Active Problem List   Diagnosis Date Noted  . Allergic rhinitis due to pollen 08/23/2018  . DDD (degenerative disc disease), lumbar 08/23/2018  . Lumbar neuritis 08/23/2018  . 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  . GERD (gastroesophageal reflux disease) 08/23/2018  . Morbid obesity ([place]) 08/23/2018  . Obstructive sleep apnea 08/23/2018  . Intervertebral disc disorder with radiculopathy of lumbar region 08/23/2018  . CML (chronic myelocytic leukemia) ([place]) 08/23/2018  . Hx of myocardial perfusion scan 08/23/2018  . Osteoarthritis 08/23/2018  . Degenerative arthritis of hip 08/23/2018  . Lumbar spondylosis 08/23/2018  . SOB (shortness of breath) 08/23/2018  . History of CVA (cerebrovascular accident) 08/23/2018  . Essential hypertension 08/23/2018  . Chronic right shoulder pain 08/23/2018  . Complete tear of right rotator cuff 08/23/2018  . Acute pain of left shoulder 08/23/2018  . On antineoplastic chemotherapy 08/23/2018  . Hyperlipidemia 08/23/2018  . Neurogenic claudication due to lumbar spinal stenosis 08/23/2018  . Vitamin D deficiency 08/23/2018  Ruben Im, PT 09/29/18 11:44 AM Phone: [phone] Fax: [phone] Alvera Singh 09/29/2018, 11:42 AM  [facility] [address] [place], [place], [address] Phone: [phone]   Fax:  [phone]  Name: Pamela Cox MRN: [id number] Date of Birth: December 04, 1940

## 2018-10-02 ENCOUNTER — Ambulatory Visit: Payer: Medicare HMO | Admitting: Physical Therapy

## 2018-10-02 DIAGNOSIS — G8929 Other chronic pain: Secondary | ICD-10-CM

## 2018-10-02 DIAGNOSIS — M5442 Lumbago with sciatica, left side: Principal | ICD-10-CM

## 2018-10-02 DIAGNOSIS — R293 Abnormal posture: Secondary | ICD-10-CM

## 2018-10-02 DIAGNOSIS — M6281 Muscle weakness (generalized): Secondary | ICD-10-CM

## 2018-10-02 DIAGNOSIS — M5441 Lumbago with sciatica, right side: Principal | ICD-10-CM

## 2018-10-02 DIAGNOSIS — R262 Difficulty in walking, not elsewhere classified: Secondary | ICD-10-CM

## 2018-10-02 NOTE — Therapy (Signed)
[facility] [address], [place], [place], [address] Phone: [phone]   Fax:  [phone]  Physical Therapy Treatment  Patient Details  Name: Pamela Cox MRN: [id number] Date of Birth: 04-Sep-1941 Referring Provider (PT): Edger House, MD   Encounter Date: 10/02/2018  PT End of Session - 10/02/18 1001    Visit Number  11    Date for PT Re-Evaluation  10/18/18    Authorization Type  Aetna Medicare    PT Start Time  0930    PT Stop Time  1012    PT Time Calculation (min)  42 min    Activity Tolerance  Patient tolerated treatment well;No increased pain    Behavior During Therapy  WFL for tasks assessed/performed       Past Medical History:  Diagnosis Date  . Hypertension   . Myelocytic leukemia, chronic ([place])   . Sleep apnea     No past surgical history on file.  There were no vitals filed for this visit.  Subjective Assessment - 10/02/18 0933    Subjective  Pt reports things are going well. She has no pain currently. She his happy about this.     How long can you stand comfortably?  10 min    Currently in Pain?  No/denies                       [facility] Adult PT Treatment/Exercise - 10/02/18 0001      Lumbar Exercises: Aerobic   Nustep  1 min intervals L1 to L4 x7 min      Lumbar Exercises: Standing   Heel Raises  20 reps    Heel Raises Limitations  BUE  support       Lumbar Exercises: Seated   Other Seated Lumbar Exercises  B rows alternating seated and standing 2x15 reps each with green TB     Other Seated Lumbar Exercises  seated B horizontal abduction with yellow TB 2x15 reps       Lumbar Exercises: Supine   Other Supine Lumbar Exercises  BLE knees to chest with red physioball x20 reps; BLE on red physioball 90/90 trunk rotation x20 reps       Lumbar Exercises: Sidelying   Other Sidelying Lumbar Exercises  clamshell with red TB x20 reps       Knee/Hip Exercises: Standing   Hip Abduction  Stengthening;Right;Left;2 sets;10 reps;Knee straight;Limitations    Abduction Limitations  yellow TB around feet             PT Education - 10/02/18 1004    Education Details  technique with therex    Person(s) Educated  Patient    Methods  Explanation;Verbal cues    Comprehension  Verbalized understanding;Returned demonstration       PT Short Term Goals - 09/25/18 0927      PT SHORT TERM GOAL #1   Title  Pt will be independent in initial HEP.    Time  3    Period  Weeks    Status  Achieved      PT SHORT TERM GOAL #2   Title  Pt will report at least 20% improvement in pain throughout the day.    Time  4    Period  Weeks    Status  Achieved      PT SHORT TERM GOAL #3   Title  Pt will be able to tolerate standing or walking activity for >  or = 10 min with pain rating < or = 6/10.    Time  4    Period  Weeks    Status  Achieved        PT Long Term Goals - 09/29/18 1138      PT LONG TERM GOAL #1   Title  Pt will be independent in advanced HEP.    Time  8    Period  Weeks    Status  On-going      PT LONG TERM GOAL #2   Title  Pt will report at least 30% improvement in pain throughout the day.    Status  Achieved      PT LONG TERM GOAL #3   Title  Pt will be able to go up and down one flight of stairs with improved body mechanics to get to and from her bedroom to increase safety and energy conservation.    Time  8    Period  Weeks    Status  On-going      PT LONG TERM GOAL #4   Title  Pt will report incoropartion of 5-10 min of standing or walking tasks out of wheelchair at least 2 times daily with pain rating of < or = 5/10 to increase her mobility and endurance.    Time  8    Period  Weeks    Status  On-going            Plan - 10/02/18 0959    Clinical Impression Statement  Pt arrived without reports of pain. She was able to complete standing therex with increase in sets and repetitions this session without reports of low back  discomfort. She did report muscle fatigue with hip abduction and standing marches, but was reminded that this is expected. Pt's weakness in her LE and trunk is evident with the need for intermittent cues to correct her technique. Will continue with current POC.     Rehab Potential  Good    PT Frequency  2x / week    PT Duration  8 weeks    PT Treatment/Interventions  ADLs/Self Care Home Management;Electrical Stimulation;Cryotherapy;Moist Heat;Traction;Gait training;Stair training;Functional mobility training;Therapeutic activities;Therapeutic exercise;Balance training;Neuromuscular re-education;Patient/family education;Manual techniques;Passive range of motion;Dry needling;Taping;Energy conservation;Joint Manipulations    PT Next Visit Plan  continue increasing standing ther ex as tolerated    PT Home Exercise Plan  Access Code: [email or web address]       Patient will benefit from skilled therapeutic intervention in order to improve the following deficits and impairments:  Abnormal gait, Decreased range of motion, Difficulty walking, Impaired tone, Cardiopulmonary status limiting activity, Decreased endurance, Increased muscle spasms, Obesity, Decreased activity tolerance, Pain, Hypomobility, Impaired flexibility, Improper body mechanics, Decreased strength, Decreased mobility, Postural dysfunction  Visit Diagnosis: Chronic bilateral low back pain with bilateral sciatica  Abnormal posture  Muscle weakness (generalized)  Difficulty in walking, not elsewhere classified     Problem List Patient Active Problem List   Diagnosis Date Noted  . Allergic rhinitis due to pollen 08/23/2018  . DDD (degenerative disc disease), lumbar 08/23/2018  . Lumbar neuritis 08/23/2018  . 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  . GERD (gastroesophageal reflux disease) 08/23/2018  . Morbid obesity ([place]) 08/23/2018  . Obstructive sleep apnea 08/23/2018  . Intervertebral disc disorder with radiculopathy of lumbar region 08/23/2018   . CML (chronic myelocytic leukemia) ([place]) 08/23/2018  . Hx of myocardial perfusion scan 08/23/2018  . Osteoarthritis 08/23/2018  . Degenerative arthritis of hip 08/23/2018  . Lumbar spondylosis  08/23/2018  . SOB (shortness of breath) 08/23/2018  . History of CVA (cerebrovascular accident) 08/23/2018  . Essential hypertension 08/23/2018  . Chronic right shoulder pain 08/23/2018  . Complete tear of right rotator cuff 08/23/2018  . Acute pain of left shoulder 08/23/2018  . On antineoplastic chemotherapy 08/23/2018  . Hyperlipidemia 08/23/2018  . Neurogenic claudication due to lumbar spinal stenosis 08/23/2018  . Vitamin D deficiency 08/23/2018   10:14 AM,10/02/18 Sherol Dade PT, DPT [place] [facility] [address] [place], [place], [address] Phone: [phone]   Fax:  [phone]  Name: Pamela Cox MRN: [id number] Date of Birth: 1941-02-08

## 2018-10-06 ENCOUNTER — Ambulatory Visit: Payer: Medicare HMO | Admitting: Physical Therapy

## 2018-10-09 ENCOUNTER — Encounter: Payer: Self-pay | Admitting: Physical Therapy

## 2018-10-09 ENCOUNTER — Ambulatory Visit: Payer: Medicare HMO | Admitting: Physical Therapy

## 2018-10-09 DIAGNOSIS — R293 Abnormal posture: Secondary | ICD-10-CM

## 2018-10-09 DIAGNOSIS — M5442 Lumbago with sciatica, left side: Principal | ICD-10-CM

## 2018-10-09 DIAGNOSIS — G8929 Other chronic pain: Secondary | ICD-10-CM

## 2018-10-09 DIAGNOSIS — M5441 Lumbago with sciatica, right side: Principal | ICD-10-CM

## 2018-10-09 DIAGNOSIS — R262 Difficulty in walking, not elsewhere classified: Secondary | ICD-10-CM

## 2018-10-09 DIAGNOSIS — M6281 Muscle weakness (generalized): Secondary | ICD-10-CM

## 2018-10-09 NOTE — Therapy (Signed)
[facility] [address] [place], [place], [address] Phone: [phone]   Fax:  [phone]  Physical Therapy Treatment  Patient Details  Name: Pamela Cox MRN: [id number] Date of Birth: 03/24/1941 Referring Provider (PT): Edger House, MD   Encounter Date: 10/09/2018  PT End of Session - 10/09/18 0845    Visit Number  12    Date for PT Re-Evaluation  10/18/18    Authorization Type  [employer] Medicare    PT Start Time  6160    PT Stop Time  0927    PT Time Calculation (min)  43 min    Activity Tolerance  Patient tolerated treatment well;No increased pain    Behavior During Therapy  WFL for tasks assessed/performed       Past Medical History:  Diagnosis Date  . Hypertension   . Myelocytic leukemia, chronic ([place])   . Sleep apnea     History reviewed. No pertinent surgical history.  There were no vitals filed for this visit.  Subjective Assessment - 10/09/18 0847    Subjective  Pt reports she is getting up out of her wheelchair 5-6 times a day but can only perform standing activities for 5-10 before her back gets too tired to stand.  She does not get increased leg pain during that time frame.    Patient is accompained by:  Family member    Limitations  Sitting;Standing;Walking;House hold activities;Other (comment)    How long can you sit comfortably?  1 hour    How long can you stand comfortably?  10 min    How long can you walk comfortably?  5 min, uses 2 wheel walker in community, wheelchair at home to be able to sit     Diagnostic tests  MRI through [facility]    Patient Stated Goals  avoid surgery, improve walking, reduce pain    Currently in Pain?  No/denies                       [facility] Adult PT Treatment/Exercise - 10/09/18 0001      Exercises   Exercises  Lumbar;Knee/Hip      Lumbar Exercises: Stretches   Lower Trunk Rotation  Other (comment)   20 reps, AROM supine     Lumbar  Exercises: Aerobic   Nustep  level 3 x 11'   PT present to discuss POC, review goals     Lumbar Exercises: Seated   Other Seated Lumbar Exercises  seated physioball rollouts for trunk flexion stetch x 15 reps      Lumbar Exercises: Supine   Glut Set  10 reps;5 seconds    Glut Set Limitations  with physioball press and core cueing      Knee/Hip Exercises: Standing   Hip Abduction  AROM;5 reps;Knee straight    Abduction Limitations  standing on foam, 2# ankle weight   bil UE support   Hip Extension  AROM;Stengthening;5 reps;Knee straight    Extension Limitations  standing on foam, 2# ankle weights, bil UE support    Functional Squat  5 reps;2 sets    Functional Squat Limitations  sitting on foam pad    Stairs  4 stair flight step-to pattern bil UE rails x 2 sets      Knee/Hip Exercises: Seated   Long Arc Quad  Strengthening;Both;2 sets;15 reps;Weights    Long Arc Quad Weight  2 lbs.    Marching  Strengthening;Both;15 reps;2 sets;Weights  Marching Weights  2 lbs.    Hamstring Curl  Strengthening;Both;15 reps;2 sets    Hamstring Limitations  red band      Knee/Hip Exercises: Supine   Other Supine Knee/Hip Exercises  glut sets 10x 5 sec with large ball press for abdominals               PT Short Term Goals - 09/25/18 0927      PT SHORT TERM GOAL #1   Title  Pt will be independent in initial HEP.    Time  3    Period  Weeks    Status  Achieved      PT SHORT TERM GOAL #2   Title  Pt will report at least 20% improvement in pain throughout the day.    Time  4    Period  Weeks    Status  Achieved      PT SHORT TERM GOAL #3   Title  Pt will be able to tolerate standing or walking activity for > or = 10 min with pain rating < or = 6/10.    Time  4    Period  Weeks    Status  Achieved        PT Long Term Goals - 10/09/18 0849      PT LONG TERM GOAL #1   Title  Pt will be independent in advanced HEP.    Time  8    Period  Weeks    Status  On-going      PT  LONG TERM GOAL #2   Title  Pt will report at least 30% improvement in pain throughout the day.    Time  8    Period  Weeks    Status  Achieved      PT LONG TERM GOAL #3   Title  Pt will be able to go up and down one flight of stairs with improved body mechanics to get to and from her bedroom to increase safety and energy conservation.    Time  8    Period  Weeks    Status  On-going      PT LONG TERM GOAL #4   Title  Pt will report incoropartion of 5-10 min of standing or walking tasks out of wheelchair at least 2 times daily with pain rating of < or = 5/10 to increase her mobility and endurance.    Time  8    Period  Weeks    Status  Achieved            Plan - 10/09/18 6761    Clinical Impression Statement  Pt reports reduction of pain throughout daily activities.  She "hardly ever" uses her rolling walker and is now getting out of her wheelchair at home 5-6 times daily for 5-10 min at a time.  This level of activity is a significant increase compared to full time sitting throughout the day as was reported at her evaluation.  She reports back fatigue vs pain that causes her to need to sit after 10 min of standing.  She continues to make progress in standing tolerance for ther ex and needs further strength through quads and gluteals for stair performance.  She will continue to benefit from skilled PT along POC to improve strength, endurance and functional strength to increase daily activity tolerance throughout the day.    Rehab Potential  Good    PT Frequency  2x / week  PT Duration  8 weeks    PT Treatment/Interventions  ADLs/Self Care Home Management;Electrical Stimulation;Cryotherapy;Moist Heat;Traction;Gait training;Stair training;Functional mobility training;Therapeutic activities;Therapeutic exercise;Balance training;Neuromuscular re-education;Patient/family education;Manual techniques;Passive range of motion;Dry needling;Taping;Energy conservation;Joint Manipulations    PT  Next Visit Plan  continue increasing standing ther ex as tolerated    PT Home Exercise Plan  Access Code: [email or web address]    Consulted and Agree with Plan of Care  Patient;Family member/caregiver       Patient will benefit from skilled therapeutic intervention in order to improve the following deficits and impairments:  Abnormal gait, Decreased range of motion, Difficulty walking, Impaired tone, Cardiopulmonary status limiting activity, Decreased endurance, Increased muscle spasms, Obesity, Decreased activity tolerance, Pain, Hypomobility, Impaired flexibility, Improper body mechanics, Decreased strength, Decreased mobility, Postural dysfunction  Visit Diagnosis: Chronic bilateral low back pain with bilateral sciatica  Abnormal posture  Muscle weakness (generalized)  Difficulty in walking, not elsewhere classified     Problem List Patient Active Problem List   Diagnosis Date Noted  . Allergic rhinitis due to pollen 08/23/2018  . DDD (degenerative disc disease), lumbar 08/23/2018  . Lumbar neuritis 08/23/2018  . 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  . GERD (gastroesophageal reflux disease) 08/23/2018  . Morbid obesity ([place]) 08/23/2018  . Obstructive sleep apnea 08/23/2018  . Intervertebral disc disorder with radiculopathy of lumbar region 08/23/2018  . CML (chronic myelocytic leukemia) ([place]) 08/23/2018  . Hx of myocardial perfusion scan 08/23/2018  . Osteoarthritis 08/23/2018  . Degenerative arthritis of hip 08/23/2018  . Lumbar spondylosis 08/23/2018  . SOB (shortness of breath) 08/23/2018  . History of CVA (cerebrovascular accident) 08/23/2018  . Essential hypertension 08/23/2018  . Chronic right shoulder pain 08/23/2018  . Complete tear of right rotator cuff 08/23/2018  . Acute pain of left shoulder 08/23/2018  . On antineoplastic chemotherapy 08/23/2018  . Hyperlipidemia 08/23/2018  . Neurogenic claudication due to lumbar spinal stenosis 08/23/2018  . Vitamin D deficiency 08/23/2018    Baruch Merl, PT 10/09/18 9:28 AM   [facility] [address] [place], [place], [address] Phone: [phone]   Fax:  [phone]  Name: Pamela Cox MRN: [id number] Date of Birth: 22-Oct-1941

## 2018-10-13 ENCOUNTER — Encounter: Payer: Self-pay | Admitting: Physical Therapy

## 2018-10-13 ENCOUNTER — Ambulatory Visit: Payer: Medicare HMO | Admitting: Physical Therapy

## 2018-10-13 DIAGNOSIS — G8929 Other chronic pain: Secondary | ICD-10-CM

## 2018-10-13 DIAGNOSIS — M5442 Lumbago with sciatica, left side: Principal | ICD-10-CM

## 2018-10-13 DIAGNOSIS — R293 Abnormal posture: Secondary | ICD-10-CM

## 2018-10-13 DIAGNOSIS — R262 Difficulty in walking, not elsewhere classified: Secondary | ICD-10-CM

## 2018-10-13 DIAGNOSIS — M6281 Muscle weakness (generalized): Secondary | ICD-10-CM

## 2018-10-13 DIAGNOSIS — M5441 Lumbago with sciatica, right side: Principal | ICD-10-CM

## 2018-10-13 NOTE — Therapy (Signed)
[facility] [address] [place], [place], [address] Phone: [phone]   Fax:  [phone]  Physical Therapy Treatment  Patient Details  Name: Pamela Cox MRN: [id number] Date of Birth: 06-17-1941 Referring Provider (PT): Edger House, MD   Encounter Date: 10/13/2018  PT End of Session - 10/13/18 0915    Visit Number  13    Date for PT Re-Evaluation  10/18/18    Authorization Type  Aetna Medicare    PT Start Time  0845    PT Stop Time  0925    PT Time Calculation (min)  40 min    Activity Tolerance  Patient tolerated treatment well;No increased pain       Past Medical History:  Diagnosis Date  . Hypertension   . Myelocytic leukemia, chronic ([place])   . Sleep apnea     History reviewed. No pertinent surgical history.  There were no vitals filed for this visit.  Subjective Assessment - 10/13/18 0848    Subjective  LBP in the mornings.  Less pain after therapy now.  It's getting better.  I do a lot of steps at home and stand to wash dishes.  My arm hurt all the last night so I couldn't sleep.  I have an appt about my shoulder after Thanksgiving.      Currently in Pain?  No/denies No pain at the moment.    Pain Score  0-No pain    Pain Orientation  Left                       [facility] Adult PT Treatment/Exercise - 10/13/18 0001      Lumbar Exercises: Aerobic   Nustep  level 3 x 10'   PT present to discuss POC, review goals with moist heat      Lumbar Exercises: Seated   Other Seated Lumbar Exercises  foam roll push down 15x      Knee/Hip Exercises: Standing   Heel Raises  Both;10 reps    Hip Abduction  Stengthening;Right;Left;15 reps    Abduction Limitations  2#    Hip Extension  Stengthening;Right;Left;10 reps    Extension Limitations  2#    Other Standing Knee Exercises  step taps 2# 10x right/left       Knee/Hip Exercises: Seated   Long Arc Quad  Strengthening;Both;2 sets;15  reps;Weights    Long Arc Quad Weight  2 lbs.    Ball Squeeze  20x    Marching  Strengthening;Both;15 reps;2 sets;Weights    Marching Weights  2 lbs.    Hamstring Curl  Strengthening;Both;15 reps;2 sets    Hamstring Limitations  red band    Sit to Sand  10 reps   from chair plus black foam     Moist Heat Therapy   Number Minutes Moist Heat  10 Minutes   with Nu-Step   Moist Heat Location  Lumbar Spine               PT Short Term Goals - 09/25/18 0927      PT SHORT TERM GOAL #1   Title  Pt will be independent in initial HEP.    Time  3    Period  Weeks    Status  Achieved      PT SHORT TERM GOAL #2   Title  Pt will report at least 20% improvement in pain throughout the day.    Time  4  Period  Weeks    Status  Achieved      PT SHORT TERM GOAL #3   Title  Pt will be able to tolerate standing or walking activity for > or = 10 min with pain rating < or = 6/10.    Time  4    Period  Weeks    Status  Achieved        PT Long Term Goals - 10/09/18 0849      PT LONG TERM GOAL #1   Title  Pt will be independent in advanced HEP.    Time  8    Period  Weeks    Status  On-going      PT LONG TERM GOAL #2   Title  Pt will report at least 30% improvement in pain throughout the day.    Time  8    Period  Weeks    Status  Achieved      PT LONG TERM GOAL #3   Title  Pt will be able to go up and down one flight of stairs with improved body mechanics to get to and from her bedroom to increase safety and energy conservation.    Time  8    Period  Weeks    Status  On-going      PT LONG TERM GOAL #4   Title  Pt will report incoropartion of 5-10 min of standing or walking tasks out of wheelchair at least 2 times daily with pain rating of < or = 5/10 to increase her mobility and endurance.    Time  8    Period  Weeks    Status  Achieved            Plan - 10/13/18 0916    Clinical Impression Statement  The patient continues to be limited in standing tolerance  secondary fatigue in LEs but her pain level in her back seems much better during therapy and following sessions.  She is able to tolerate 1 minute bouts of standing at one time with seated LE strengthening performed between standing exercises.  Therapist closely monitoring response and supervision in standing for safety.  Will reassess progress next visit.      Rehab Potential  Good    PT Frequency  2x / week    PT Duration  8 weeks    PT Treatment/Interventions  ADLs/Self Care Home Management;Electrical Stimulation;Cryotherapy;Moist Heat;Traction;Gait training;Stair training;Functional mobility training;Therapeutic activities;Therapeutic exercise;Balance training;Neuromuscular re-education;Patient/family education;Manual techniques;Passive range of motion;Dry needling;Taping;Energy conservation;Joint Manipulations    PT Next Visit Plan  ERO; check progress toward goals to determine continuation of PT vs. readiness for discharge to HEP;    continue increasing standing ther ex as tolerated    PT Home Exercise Plan  Access Code: [email or web address]       Patient will benefit from skilled therapeutic intervention in order to improve the following deficits and impairments:  Abnormal gait, Decreased range of motion, Difficulty walking, Impaired tone, Cardiopulmonary status limiting activity, Decreased endurance, Increased muscle spasms, Obesity, Decreased activity tolerance, Pain, Hypomobility, Impaired flexibility, Improper body mechanics, Decreased strength, Decreased mobility, Postural dysfunction  Visit Diagnosis: Chronic bilateral low back pain with bilateral sciatica  Abnormal posture  Muscle weakness (generalized)  Difficulty in walking, not elsewhere classified     Problem List Patient Active Problem List   Diagnosis Date Noted  . Allergic rhinitis due to pollen 08/23/2018  . DDD (degenerative disc disease), lumbar 08/23/2018  . Lumbar neuritis 08/23/2018  .  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  .  GERD (gastroesophageal reflux disease) 08/23/2018  . Morbid obesity ([place]) 08/23/2018  . Obstructive sleep apnea 08/23/2018  . Intervertebral disc disorder with radiculopathy of lumbar region 08/23/2018  . CML (chronic myelocytic leukemia) ([place]) 08/23/2018  . Hx of myocardial perfusion scan 08/23/2018  . Osteoarthritis 08/23/2018  . Degenerative arthritis of hip 08/23/2018  . Lumbar spondylosis 08/23/2018  . SOB (shortness of breath) 08/23/2018  . History of CVA (cerebrovascular accident) 08/23/2018  . Essential hypertension 08/23/2018  . Chronic right shoulder pain 08/23/2018  . Complete tear of right rotator cuff 08/23/2018  . Acute pain of left shoulder 08/23/2018  . On antineoplastic chemotherapy 08/23/2018  . Hyperlipidemia 08/23/2018  . Neurogenic claudication due to lumbar spinal stenosis 08/23/2018  . Vitamin D deficiency 08/23/2018   Ruben Im, PT 10/13/18 12:36 PM Phone: [phone] Fax: [phone]  Alvera Singh 10/13/2018, 12:35 PM  [facility] [address], [place], [place], [address] Phone: [phone]   Fax:  [phone]  Name: Fotini Lemus MRN: [id number] Date of Birth: August 27, 1941

## 2018-10-16 ENCOUNTER — Encounter: Payer: Self-pay | Admitting: Physical Therapy

## 2018-10-16 ENCOUNTER — Ambulatory Visit: Payer: Medicare HMO | Admitting: Physical Therapy

## 2018-10-16 DIAGNOSIS — R293 Abnormal posture: Secondary | ICD-10-CM

## 2018-10-16 DIAGNOSIS — M5442 Lumbago with sciatica, left side: Secondary | ICD-10-CM | POA: Diagnosis not present

## 2018-10-16 DIAGNOSIS — M6281 Muscle weakness (generalized): Secondary | ICD-10-CM

## 2018-10-16 DIAGNOSIS — G8929 Other chronic pain: Secondary | ICD-10-CM

## 2018-10-16 DIAGNOSIS — R262 Difficulty in walking, not elsewhere classified: Secondary | ICD-10-CM

## 2018-10-16 DIAGNOSIS — M5441 Lumbago with sciatica, right side: Principal | ICD-10-CM

## 2018-10-16 NOTE — Therapy (Signed)
[facility] [address] [place], [place], [address] Phone: [phone]   Fax:  [phone]  Physical Therapy Treatment  Patient Details  Name: Pamela Cox MRN: [id number] Date of Birth: 11/24/1940 Referring Provider (PT): Edger House, MD   Encounter Date: 10/16/2018  PT End of Session - 10/16/18 0846    Visit Number  14    Date for PT Re-Evaluation  10/18/18    Authorization Type  [employer]na Medicare    PT Start Time  0846    PT Stop Time  0927    PT Time Calculation (min)  41 min    Activity Tolerance  Patient tolerated treatment well;No increased pain    Behavior During Therapy  WFL for tasks assessed/performed       Past Medical History:  Diagnosis Date  . Hypertension   . Myelocytic leukemia, chronic ([place])   . Sleep apnea     History reviewed. No pertinent surgical history.  There were no vitals filed for this visit.  Subjective Assessment - 10/16/18 0854    Subjective  Pt reports she is doing better with PT and feels about 50% improvement overall in pain and improved activity at home since starting PT.  Pt now can stand 5-10 min at a time and needs to sit due to fatigue vs pain after standing activites around the house.  She makes an effort to stand out of W/C at least 5x/day.    Patient is accompained by:  Family member    Limitations  Sitting;Standing;Walking;House hold activities;Other (comment)    How long can you sit comfortably?  1 hour    How long can you stand comfortably?  10 min    How long can you walk comfortably?  5 min, uses 2 wheel walker in community, wheelchair at home to be able to sit     Diagnostic tests  MRI through [facility]    Patient Stated Goals  avoid surgery, improve walking, reduce pain    Currently in Pain?  No/denies    Pain Score  0-No pain         [facility] PT Assessment - 10/16/18 0001      Assessment   Medical Diagnosis  M51.16 (ICD-10-CM) - Intervertebral disc disorders  with radiculopathy, lumbar region    Referring Provider (PT)  Edger House, MD    Onset Date/Surgical Date  --   5+ years, chronic   Prior Therapy  no      Observation/Other Assessments   Focus on Therapeutic Outcomes (FOTO)   44% limitation on 09/29/18   reduced from 68% at evaluation     AROM   Lumbar Flexion  60    Lumbar Extension  15    Lumbar - Right Side Bend  20    Lumbar - Left Side Bend  20      Strength   Right Hip Flexion  4+/5    Right Hip Extension  4+/5    Right Hip External Rotation   4+/5    Right Hip Internal Rotation  4+/5    Right Hip ABduction  4-/5    Right Hip ADduction  4+/5    Left Hip Flexion  4/5    Left Hip Extension  4+/5    Left Hip External Rotation  4/5    Left Hip Internal Rotation  4+/5    Left Hip ABduction  4-/5    Left Hip ADduction  4+/5  [facility] Adult PT Treatment/Exercise - 10/16/18 0001      Exercises   Exercises  Lumbar      Lumbar Exercises: Aerobic   Nustep  level 3 x 10'   PT present to review goals     Lumbar Exercises: Standing   Heel Raises  20 reps   PT cued Pt not to use trunk momentum     Knee/Hip Exercises: Standing   Hip Abduction  Stengthening;Right;Left;15 reps    Hip Extension  Right;15 reps;Left;Stengthening      Knee/Hip Exercises: Seated   Long Arc Quad  Strengthening;Both;2 sets;15 reps;Weights    Long Arc Quad Weight  3 lbs.   seated on foam pad   Clamshell with TheraBand  Red   2x15   Marching  Strengthening;Both;2 sets;15 reps    Marching Weights  3 lbs.    Hamstring Curl  Strengthening;Both;15 reps    Hamstring Limitations  red band               PT Short Term Goals - 10/16/18 0847      PT SHORT TERM GOAL #1   Title  Pt will be independent in initial HEP.    Time  3    Period  Weeks    Status  Achieved      PT SHORT TERM GOAL #2   Title  Pt will report at least 20% improvement in pain throughout the day.    Time  4    Period  Weeks    Status   Achieved      PT SHORT TERM GOAL #3   Title  Pt will be able to tolerate standing or walking activity for > or = 10 min with pain rating < or = 6/10.    Time  4    Period  Weeks    Status  Achieved        PT Long Term Goals - 10/16/18 0848      PT LONG TERM GOAL #1   Title  Pt will be independent in advanced HEP.    Time  8    Period  Weeks    Status  Achieved      PT LONG TERM GOAL #2   Title  Pt will report at least 30% improvement in pain throughout the day.    Time  8    Period  Weeks    Status  Achieved   Pt reports 50% improvement      PT LONG TERM GOAL #3   Title  Pt will be able to go up and down one flight of stairs with improved body mechanics to get to and from her bedroom to increase safety and energy conservation.    Time  8    Period  Weeks    Status  Achieved      PT LONG TERM GOAL #4   Title  Pt will report incoropartion of 5-10 min of standing or walking tasks out of wheelchair at least 2 times daily with pain rating of < or = 5/10 to increase her mobility and endurance.    Time  8    Period  Weeks    Status  Achieved   Pt gets out of chair 5+ x/day to perform standing activities 5-10 min of activity           Plan - 10/16/18 0858    Clinical Impression Statement  Pt reports improved activity tolerance and reduced pain with PT.  She has met all STG/LTGs and reduced FOTO score from 68% to 44% limitation.  She performs standing activities for 5-10 min at a time 5+ times a day out of her W/C and reports she is limited by fatigue more than pain in standing.  LE and core strength have improved to [facility] for daily tasks.  She will follow up with referring MD to discuss any further interventions and be D/C'd from PT today with compliance with HEP to maintain gains and pain control through activity.      Rehab Potential  Good    PT Frequency  2x / week    PT Duration  8 weeks    PT Treatment/Interventions  ADLs/Self Care Home Management;Electrical  Stimulation;Cryotherapy;Moist Heat;Traction;Gait training;Stair training;Functional mobility training;Therapeutic activities;Therapeutic exercise;Balance training;Neuromuscular re-education;Patient/family education;Manual techniques;Passive range of motion;Dry needling;Taping;Energy conservation;Joint Manipulations    PT Next Visit Plan  D/C PT to HEP with f/u as needed.    PT Home Exercise Plan  Access Code: [email or web address]    Consulted and Agree with Plan of Care  Patient;Family member/caregiver       Patient will benefit from skilled therapeutic intervention in order to improve the following deficits and impairments:  Abnormal gait, Decreased range of motion, Difficulty walking, Impaired tone, Cardiopulmonary status limiting activity, Decreased endurance, Increased muscle spasms, Obesity, Decreased activity tolerance, Pain, Hypomobility, Impaired flexibility, Improper body mechanics, Decreased strength, Decreased mobility, Postural dysfunction  Visit Diagnosis: Chronic bilateral low back pain with bilateral sciatica  Abnormal posture  Muscle weakness (generalized)  Difficulty in walking, not elsewhere classified     Problem List Patient Active Problem List   Diagnosis Date Noted  . Allergic rhinitis due to pollen 08/23/2018  . DDD (degenerative disc disease), lumbar 08/23/2018  . Lumbar neuritis 08/23/2018  . 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  . GERD (gastroesophageal reflux disease) 08/23/2018  . Morbid obesity ([place]) 08/23/2018  . Obstructive sleep apnea 08/23/2018  . Intervertebral disc disorder with radiculopathy of lumbar region 08/23/2018  . CML (chronic myelocytic leukemia) ([place]) 08/23/2018  . Hx of myocardial perfusion scan 08/23/2018  . Osteoarthritis 08/23/2018  . Degenerative arthritis of hip 08/23/2018  . Lumbar spondylosis 08/23/2018  . SOB (shortness of breath) 08/23/2018  . History of CVA (cerebrovascular accident) 08/23/2018  . Essential hypertension 08/23/2018  .  Chronic right shoulder pain 08/23/2018  . Complete tear of right rotator cuff 08/23/2018  . Acute pain of left shoulder 08/23/2018  . On antineoplastic chemotherapy 08/23/2018  . Hyperlipidemia 08/23/2018  . Neurogenic claudication due to lumbar spinal stenosis 08/23/2018  . Vitamin D deficiency 08/23/2018    Baruch Merl, PT 10/16/18 9:23 AM  PHYSICAL THERAPY DISCHARGE SUMMARY  Visits from Start of Care: 14  Current functional level related to goals / functional outcomes: See above   Remaining deficits: See above   Education / Equipment: HEP, therabands Plan: Patient agrees to discharge.  Patient goals were partially met. Patient is being discharged due to meeting the stated rehab goals.  ?????        Baruch Merl, PT 10/16/18 9:24 AM   [facility] [address], [place], [place], [address] Phone: [phone]   Fax:  [phone]  Name: Zaire Vanbuskirk MRN: [id number] Date of Birth: December 22, 1940

## 2018-10-18 ENCOUNTER — Encounter: Payer: Medicare HMO | Admitting: Physical Therapy

## 2019-06-06 ENCOUNTER — Other Ambulatory Visit: Payer: Self-pay | Admitting: Family Medicine

## 2019-06-06 DIAGNOSIS — Z1231 Encounter for screening mammogram for malignant neoplasm of breast: Secondary | ICD-10-CM

## 2019-06-20 ENCOUNTER — Other Ambulatory Visit: Payer: Self-pay

## 2019-06-20 ENCOUNTER — Encounter (INDEPENDENT_AMBULATORY_CARE_PROVIDER_SITE_OTHER): Payer: Self-pay

## 2019-06-20 ENCOUNTER — Ambulatory Visit
Admission: RE | Admit: 2019-06-20 | Discharge: 2019-06-20 | Disposition: A | Discharge status: Home / Self Care | Payer: Medicare HMO | Source: Ambulatory Visit | Attending: Family Medicine | Admitting: Family Medicine

## 2019-06-20 DIAGNOSIS — Z1231 Encounter for screening mammogram for malignant neoplasm of breast: Secondary | ICD-10-CM | POA: Insufficient documentation

## 2019-07-12 ENCOUNTER — Other Ambulatory Visit
Admission: RE | Admit: 2019-07-12 | Discharge: 2019-07-12 | Disposition: A | Discharge status: Home / Self Care | Payer: Medicare HMO | Source: Ambulatory Visit | Attending: Gastroenterology | Admitting: Gastroenterology

## 2019-07-12 ENCOUNTER — Other Ambulatory Visit: Payer: Self-pay

## 2019-07-12 DIAGNOSIS — Z01812 Encounter for preprocedural laboratory examination: Secondary | ICD-10-CM | POA: Diagnosis present

## 2019-07-12 DIAGNOSIS — K317 Polyp of stomach and duodenum: Secondary | ICD-10-CM | POA: Diagnosis not present

## 2019-07-12 DIAGNOSIS — K2970 Gastritis, unspecified, without bleeding: Secondary | ICD-10-CM | POA: Diagnosis not present

## 2019-07-12 DIAGNOSIS — Z20828 Contact with and (suspected) exposure to other viral communicable diseases: Secondary | ICD-10-CM | POA: Diagnosis not present

## 2019-07-12 DIAGNOSIS — K449 Diaphragmatic hernia without obstruction or gangrene: Secondary | ICD-10-CM | POA: Insufficient documentation

## 2019-07-12 DIAGNOSIS — K5790 Diverticulosis of intestine, part unspecified, without perforation or abscess without bleeding: Secondary | ICD-10-CM | POA: Diagnosis not present

## 2019-07-12 DIAGNOSIS — K649 Unspecified hemorrhoids: Secondary | ICD-10-CM | POA: Diagnosis not present

## 2019-07-12 LAB — SARS CORONAVIRUS 2 (TAT 6-24 HRS): SARS Coronavirus 2: NEGATIVE

## 2019-07-13 ENCOUNTER — Encounter: Payer: Self-pay | Admitting: *Deleted

## 2019-07-16 ENCOUNTER — Ambulatory Visit: Payer: Medicare HMO | Admitting: Certified Registered Nurse Anesthetist

## 2019-07-16 ENCOUNTER — Other Ambulatory Visit: Payer: Self-pay

## 2019-07-16 ENCOUNTER — Ambulatory Visit
Admission: RE | Admit: 2019-07-16 | Discharge: 2019-07-16 | Disposition: A | Discharge status: Home / Self Care | Payer: Medicare HMO | Attending: Gastroenterology | Admitting: Gastroenterology

## 2019-07-16 ENCOUNTER — Encounter: Payer: Self-pay | Admitting: *Deleted

## 2019-07-16 ENCOUNTER — Encounter
Admission: RE | Disposition: A | Discharge status: Home / Self Care | Payer: Self-pay | Source: Home / Self Care | Attending: Gastroenterology

## 2019-07-16 DIAGNOSIS — K641 Second degree hemorrhoids: Secondary | ICD-10-CM | POA: Diagnosis not present

## 2019-07-16 DIAGNOSIS — K219 Gastro-esophageal reflux disease without esophagitis: Secondary | ICD-10-CM | POA: Insufficient documentation

## 2019-07-16 DIAGNOSIS — Z79899 Other long term (current) drug therapy: Secondary | ICD-10-CM | POA: Insufficient documentation

## 2019-07-16 DIAGNOSIS — E119 Type 2 diabetes mellitus without complications: Secondary | ICD-10-CM | POA: Diagnosis not present

## 2019-07-16 DIAGNOSIS — D123 Benign neoplasm of transverse colon: Secondary | ICD-10-CM | POA: Diagnosis not present

## 2019-07-16 DIAGNOSIS — K2270 Barrett's esophagus without dysplasia: Secondary | ICD-10-CM | POA: Diagnosis not present

## 2019-07-16 DIAGNOSIS — D124 Benign neoplasm of descending colon: Secondary | ICD-10-CM | POA: Insufficient documentation

## 2019-07-16 DIAGNOSIS — K5730 Diverticulosis of large intestine without perforation or abscess without bleeding: Secondary | ICD-10-CM | POA: Insufficient documentation

## 2019-07-16 DIAGNOSIS — Z856 Personal history of leukemia: Secondary | ICD-10-CM | POA: Insufficient documentation

## 2019-07-16 DIAGNOSIS — Z6838 Body mass index (BMI) 38.0-38.9, adult: Secondary | ICD-10-CM | POA: Insufficient documentation

## 2019-07-16 DIAGNOSIS — I10 Essential (primary) hypertension: Secondary | ICD-10-CM | POA: Diagnosis not present

## 2019-07-16 DIAGNOSIS — K2950 Unspecified chronic gastritis without bleeding: Secondary | ICD-10-CM | POA: Insufficient documentation

## 2019-07-16 DIAGNOSIS — K644 Residual hemorrhoidal skin tags: Secondary | ICD-10-CM | POA: Insufficient documentation

## 2019-07-16 DIAGNOSIS — K319 Disease of stomach and duodenum, unspecified: Secondary | ICD-10-CM | POA: Diagnosis not present

## 2019-07-16 DIAGNOSIS — G4730 Sleep apnea, unspecified: Secondary | ICD-10-CM | POA: Diagnosis not present

## 2019-07-16 DIAGNOSIS — I69354 Hemiplegia and hemiparesis following cerebral infarction affecting left non-dominant side: Secondary | ICD-10-CM | POA: Diagnosis not present

## 2019-07-16 DIAGNOSIS — Z7902 Long term (current) use of antithrombotics/antiplatelets: Secondary | ICD-10-CM | POA: Insufficient documentation

## 2019-07-16 DIAGNOSIS — K5904 Chronic idiopathic constipation: Secondary | ICD-10-CM | POA: Diagnosis not present

## 2019-07-16 DIAGNOSIS — R1031 Right lower quadrant pain: Secondary | ICD-10-CM | POA: Insufficient documentation

## 2019-07-16 DIAGNOSIS — E669 Obesity, unspecified: Secondary | ICD-10-CM | POA: Diagnosis not present

## 2019-07-16 DIAGNOSIS — K449 Diaphragmatic hernia without obstruction or gangrene: Secondary | ICD-10-CM | POA: Insufficient documentation

## 2019-07-16 DIAGNOSIS — Z86010 Personal history of colonic polyps: Secondary | ICD-10-CM | POA: Insufficient documentation

## 2019-07-16 DIAGNOSIS — R1011 Right upper quadrant pain: Secondary | ICD-10-CM | POA: Diagnosis not present

## 2019-07-16 HISTORY — DX: Cerebral infarction, unspecified: I63.9

## 2019-07-16 HISTORY — DX: Chronic myeloid leukemia, BCR/ABL-positive, not having achieved remission: C92.10

## 2019-07-16 HISTORY — DX: Type 2 diabetes mellitus without complications: E11.9

## 2019-07-16 HISTORY — DX: Unspecified osteoarthritis, unspecified site: M19.90

## 2019-07-16 HISTORY — DX: Gastro-esophageal reflux disease without esophagitis: K21.9

## 2019-07-16 HISTORY — DX: Obesity, unspecified: E66.9

## 2019-07-16 HISTORY — PX: ESOPHAGOGASTRODUODENOSCOPY (EGD) WITH PROPOFOL: SHX5813

## 2019-07-16 HISTORY — PX: COLONOSCOPY WITH PROPOFOL: SHX5780

## 2019-07-16 LAB — CBC WITH DIFFERENTIAL/PLATELET
Abs Immature Granulocytes: 0.02 10*3/uL (ref 0.00–0.07)
Basophils Absolute: 0 10*3/uL (ref 0.0–0.1)
Basophils Relative: 0 %
Eosinophils Absolute: 0.2 10*3/uL (ref 0.0–0.5)
Eosinophils Relative: 3 %
HCT: 35.7 % — ABNORMAL LOW (ref 36.0–46.0)
Hemoglobin: 11.5 g/dL — ABNORMAL LOW (ref 12.0–15.0)
Immature Granulocytes: 0 %
Lymphocytes Relative: 36 %
Lymphs Abs: 2 10*3/uL (ref 0.7–4.0)
MCH: 32.1 pg (ref 26.0–34.0)
MCHC: 32.2 g/dL (ref 30.0–36.0)
MCV: 99.7 fL (ref 80.0–100.0)
Monocytes Absolute: 0.5 10*3/uL (ref 0.1–1.0)
Monocytes Relative: 8 %
Neutro Abs: 2.9 10*3/uL (ref 1.7–7.7)
Neutrophils Relative %: 53 %
Platelets: 144 10*3/uL — ABNORMAL LOW (ref 150–400)
RBC: 3.58 MIL/uL — ABNORMAL LOW (ref 3.87–5.11)
RDW: 12.7 % (ref 11.5–15.5)
WBC: 5.6 10*3/uL (ref 4.0–10.5)
nRBC: 0 % (ref 0.0–0.2)

## 2019-07-16 LAB — GLUCOSE, CAPILLARY: Glucose-Capillary: 95 mg/dL (ref 70–99)

## 2019-07-16 LAB — PROTIME-INR
INR: 1 (ref 0.8–1.2)
Prothrombin Time: 13 seconds (ref 11.4–15.2)

## 2019-07-16 SURGERY — COLONOSCOPY WITH PROPOFOL
Anesthesia: General

## 2019-07-16 MED ORDER — PROPOFOL 500 MG/50ML IV EMUL
INTRAVENOUS | Status: AC
  Filled 2019-07-16: qty 50

## 2019-07-16 MED ORDER — PROPOFOL 10 MG/ML IV BOLUS
INTRAVENOUS | Status: DC | PRN
  Administered 2019-07-16: 40 mg via INTRAVENOUS
  Administered 2019-07-16 (×3): 20 mg via INTRAVENOUS

## 2019-07-16 MED ORDER — SODIUM CHLORIDE 0.9 % IV SOLN: INTRAVENOUS | Status: DC

## 2019-07-16 MED ORDER — LIDOCAINE HCL (CARDIAC) PF 100 MG/5ML IV SOSY
PREFILLED_SYRINGE | INTRAVENOUS | Status: DC | PRN
  Administered 2019-07-16: 50 mg via INTRAVENOUS

## 2019-07-16 MED ORDER — PROPOFOL 500 MG/50ML IV EMUL
INTRAVENOUS | Status: DC | PRN
  Administered 2019-07-16: 120 ug/kg/min via INTRAVENOUS

## 2019-07-16 MED ORDER — SODIUM CHLORIDE 0.9 % IV SOLN
INTRAVENOUS | Status: DC
  Administered 2019-07-16: 08:00:00 via INTRAVENOUS

## 2019-07-16 MED ORDER — PHENYLEPHRINE HCL (PRESSORS) 10 MG/ML IV SOLN
INTRAVENOUS | Status: DC | PRN
  Administered 2019-07-16: 100 ug via INTRAVENOUS
  Administered 2019-07-16 (×2): 50 ug via INTRAVENOUS
  Administered 2019-07-16: 100 ug via INTRAVENOUS

## 2019-07-16 NOTE — H&P (Signed)
Outpatient short stay form Pre-procedure 07/16/2019 8:08 AM Pamela Sails MD  Primary Physician: Dr. Mcneil Sober  Reason for visit: EGD and colonoscopy  History of present illness: Patient is a 78 year old female presenting today for an EGD and colonoscopy in regards to complaints of right sided abdominal pain mostly right lower quadrant as well as personal history of adenomatous colon polyps.  According to her daughter patient stopped her Aggrenox on the 17th of this month.  Patient states that she has been taking Sprycel.  We will recheck a hemogram with differential.  She tolerated her prep well.  Takes no other blood thinning agent.  She does have a history of constipation and her abdominal pain improved somewhat with a bowel movement.  She is currently taking stool softeners.    Current Facility-Administered Medications:  .  0.9 %  sodium chloride infusion, , Intravenous, Continuous, Pamela Sails, MD, Last Rate: 20 mL/hr at 07/16/19 0736 .  0.9 %  sodium chloride infusion, , Intravenous, Continuous, Pamela Sails, MD  Medications Prior to Admission  Medication Sig Dispense Refill Last Dose  . amLODipine (NORVASC) 10 MG tablet Take 10 mg by mouth daily.   07/15/2019 at Unknown time  . atorvastatin (LIPITOR) 40 MG tablet Take 40 mg by mouth daily.   07/15/2019 at Unknown time  . furosemide (LASIX) 20 MG tablet Take 20 mg by mouth daily.   Past Week at Unknown time  . gabapentin (NEURONTIN) 300 MG capsule Take 300 mg by mouth 2 (two) times daily.   07/15/2019 at Unknown time  . omeprazole (PRILOSEC) 40 MG capsule Take 40 mg by mouth daily.   07/15/2019 at Unknown time  . spironolactone (ALDACTONE) 25 MG tablet Take 25 mg by mouth daily.   07/15/2019 at Unknown time  . dasatinib (SPRYCEL) 70 MG tablet Take 80 mg by mouth daily.     . diclofenac (VOLTAREN) 50 MG EC tablet Take 1 tablet (50 mg total) by mouth 2 (two) times daily. 20 tablet 0   . diclofenac sodium (VOLTAREN) 1 %  GEL Apply topically 2 (two) times daily.     Marland Kitchen dipyridamole-aspirin (AGGRENOX) 200-25 MG 12hr capsule Take 1 capsule by mouth daily.   07/10/2019  . Fexofenadine HCl (ALLEGRA PO) Take 180 mg by mouth 1 day or 1 dose.      . fluticasone (FLONASE) 50 MCG/ACT nasal spray Place 2 sprays into both nostrils daily.     . hydrochlorothiazide (HYDRODIURIL) 25 MG tablet Take 25 mg by mouth daily.     Marland Kitchen HYDROcodone-acetaminophen (NORCO/VICODIN) 5-325 MG tablet Take 1-2 tablets by mouth every 4 (four) hours as needed for moderate pain. (Patient not taking: Reported on 07/16/2019) 20 tablet 0 Not Taking at Unknown time  . labetalol (NORMODYNE) 300 MG tablet Take 300 mg by mouth 2 (two) times daily.     Marland Kitchen latanoprost (XALATAN) 0.005 % ophthalmic solution Place 1 drop into both eyes at bedtime.     Marland Kitchen losartan (COZAAR) 100 MG tablet Take 100 mg by mouth daily.     . magnesium oxide (MAG-OX) 400 MG tablet Take 400 mg by mouth daily.     . metaxalone (SKELAXIN) 800 MG tablet Take 1 tablet (800 mg total) by mouth 3 (three) times daily. 21 tablet 0      No Known Allergies   Past Medical History:  Diagnosis Date  . Arthritis   . CML (chronic myelocytic leukemia) ([place])   . CVA (cerebral vascular accident) ([place])   .  Diabetes mellitus without complication ([place])   . GERD (gastroesophageal reflux disease)   . Hypertension   . Myelocytic leukemia, chronic ([place])   . Obesity   . Sleep apnea     Review of systems:      Physical Exam    Heart and lungs: Regular rate and rhythm without rub or gallop lungs are bilaterally clear    HEENT: Normocephalic atraumatic eyes are anicteric    Other:    Pertinant exam for procedure: Soft nontender nondistended bowel sounds positive normoactive    Planned proceedures: EGD, colonoscopy and indicated procedures. I have discussed the risks benefits and complications of procedures to include not limited to bleeding, infection, perforation and the risk of sedation and the  patient wishes to proceed.    Pamela Sails, MD Gastroenterology 07/16/2019  8:08 AM

## 2019-07-16 NOTE — Anesthesia Post-op Follow-up Note (Signed)
Anesthesia QCDR form completed.        

## 2019-07-16 NOTE — Anesthesia Postprocedure Evaluation (Signed)
Anesthesia Post Note  Patient: Pamela Cox  Procedure(s) Performed: COLONOSCOPY WITH PROPOFOL (N/A ) ESOPHAGOGASTRODUODENOSCOPY (EGD) WITH PROPOFOL (N/A )  Patient location during evaluation: Endoscopy Anesthesia Type: General Level of consciousness: awake and alert Pain management: pain level controlled Vital Signs Assessment: post-procedure vital signs reviewed and stable Respiratory status: spontaneous breathing and respiratory function stable Cardiovascular status: stable Anesthetic complications: no     Last Vitals:  Vitals:   07/16/19 1013 07/16/19 1023  BP: 139/76 (!) 155/122  Pulse: 82 82  Resp: 18 13  Temp:    SpO2: 100% 100%    Last Pain:  Vitals:   07/16/19 1023  TempSrc:   PainSc: 0-No pain                 KEPHART,WILLIAM K

## 2019-07-16 NOTE — Anesthesia Preprocedure Evaluation (Signed)
Anesthesia Evaluation  Patient identified by MRN, date of birth, ID band Patient awake    Reviewed: Allergy & Precautions, NPO status , Patient's Chart, lab work & pertinent test results  History of Anesthesia Complications Negative for: history of anesthetic complications  Airway Mallampati: III       Dental   Pulmonary sleep apnea and Continuous Positive Airway Pressure Ventilation , neg COPD, Not current smoker,           Cardiovascular hypertension, Pt. on medications (-) Past MI and (-) CHF + dysrhythmias ("irregular beats") (-) Valvular Problems/Murmurs     Neuro/Psych neg Seizures Depression CVA (L sided weakness), Residual Symptoms    GI/Hepatic Neg liver ROS, GERD  Medicated and Controlled,  Endo/Other  diabetes, Type 2, Oral Hypoglycemic Agents  Renal/GU negative Renal ROS     Musculoskeletal   Abdominal   Peds  Hematology   Anesthesia Other Findings   Reproductive/Obstetrics                             Anesthesia Physical Anesthesia Plan  ASA: III  Anesthesia Plan: General   Post-op Pain Management:    Induction: Intravenous  PONV Risk Score and Plan: 3 and TIVA, Propofol infusion and Treatment may vary due to age or medical condition  Airway Management Planned: Nasal Cannula  Additional Equipment:   Intra-op Plan:   Post-operative Plan:   Informed Consent: I have reviewed the patients History and Physical, chart, labs and discussed the procedure including the risks, benefits and alternatives for the proposed anesthesia with the patient or authorized representative who has indicated his/her understanding and acceptance.       Plan Discussed with:   Anesthesia Plan Comments:         Anesthesia Quick Evaluation

## 2019-07-16 NOTE — Op Note (Signed)
[facility] Gastroenterology Patient Name: Pamela Cox Procedure Date: 07/16/2019 7:34 AM MRN: [id number] Account #: [id number] Date of Birth: 11/13/1941 Admit Type: Outpatient Age: 78 Room: [facility] ENDO ROOM 3 Gender: Female Note Status: Finalized Procedure:            Colonoscopy Indications:          Abdominal pain in the right lower quadrant, Abdominal                        pain in the right upper quadrant, Chronic idiopathic                        constipation Providers:            Lollie Sails, MD Referring MD:         No Local Md, MD (Referring MD) Medicines:            Monitored Anesthesia Care Complications:        No immediate complications. Procedure:            Pre-Anesthesia Assessment:                       - ASA Grade Assessment: III - A patient with severe                        systemic disease.                       After obtaining informed consent, the colonoscope was                        passed under direct vision. Throughout the procedure,                        the patient's blood pressure, pulse, and oxygen                        saturations were monitored continuously. The was                        introduced through the anus and advanced to the the                        cecum, identified by appendiceal orifice and ileocecal                        valve. The Colonoscope was introduced through the and                        advanced to the. The colonoscopy was performed with                        moderate difficulty. Successful completion of the                        procedure was aided by changing the patient to a prone                        position. Findings:      A few small-mouthed diverticula were found in the sigmoid colon and  descending colon.      A 10 mm polyp was found in the descending colon. The polyp was       semi-pedunculated. The polyp was removed with a cold snare. Resection       and retrieval were  complete. To prevent bleeding after the polypectomy,       two hemostatic clips were successfully placed (MR conditional). There       was no bleeding at the end of the maneuver.      A 4 mm polyp was found in the hepatic flexure. The polyp was sessile.       The polyp was removed with a cold snare. Resection and retrieval were       complete.      Non-bleeding external and internal hemorrhoids were found during       retroflexion, during perianal exam and during anoscopy. The hemorrhoids       were medium-sized and Grade II (internal hemorrhoids that prolapse but       reduce spontaneously). Impression:           - Diverticulosis in the sigmoid colon and in the                        descending colon.                       - One 10 mm polyp in the descending colon, removed with                        a cold snare. Resected and retrieved. Clips (MR                        conditional) were placed.                       - One 4 mm polyp at the hepatic flexure, removed with a                        cold snare. Resected and retrieved.                       - Non-bleeding external and internal hemorrhoids. Recommendation:       - Use Analpram HC Cream 2.5%: Apply externally TID for                        10 days. Procedure Code(s):    --- Professional ---                       [phone], Colonoscopy, flexible; with removal of tumor(s),                        polyp(s), or other lesion(s) by snare technique Diagnosis Code(s):    --- Professional ---                       K64.1, Second degree hemorrhoids                       K63.5, Polyp of colon                       R10.31, Right lower quadrant pain  R10.11, Right upper quadrant pain                       K59.04, Chronic idiopathic constipation                       K57.30, Diverticulosis of large intestine without                        perforation or abscess without bleeding CPT copyright 2019 American Medical  Association. All rights reserved. The codes documented in this report are preliminary and upon coder review may  be revised to meet current compliance requirements. Lollie Sails, MD 07/16/2019 10:13:57 AM This report has been signed electronically. Number of Addenda: 0 Note Initiated On: 07/16/2019 7:34 AM Scope Withdrawal Time: 0 hours 21 minutes 19 seconds  Total Procedure Duration: 0 hours 37 minutes 15 seconds       [facility]

## 2019-07-16 NOTE — Transfer of Care (Signed)
Immediate Anesthesia Transfer of Care Note  Patient: Pamela Cox  Procedure(s) Performed: COLONOSCOPY WITH PROPOFOL (N/A ) ESOPHAGOGASTRODUODENOSCOPY (EGD) WITH PROPOFOL (N/A )  Patient Location: PACU  Anesthesia Type:General  Level of Consciousness: awake, alert  and oriented  Airway & Oxygen Therapy: Patient Spontanous Breathing and Patient connected to face mask oxygen  Post-op Assessment: Report given to RN and Post -op Vital signs reviewed and stable  Post vital signs: Reviewed and stable  Last Vitals:  Vitals Value Taken Time  BP 139/76 07/16/19 1013  Temp    Pulse 87 07/16/19 1014  Resp 18 07/16/19 1014  SpO2 100 % 07/16/19 1014  Vitals shown include unvalidated device data.  Last Pain:  Vitals:   07/16/19 1013  TempSrc:   PainSc: 0-No pain         Complications: No apparent anesthesia complications

## 2019-07-16 NOTE — Op Note (Signed)
[facility] Gastroenterology Patient Name: Pamela Cox Procedure Date: 07/16/2019 7:35 AM MRN: [id number] Account #: [id number] Date of Birth: 21-Sep-1941 Admit Type: Outpatient Age: 78 Room: [facility] ENDO ROOM 3 Gender: Female Note Status: Finalized Procedure:            Upper GI endoscopy Indications:          Abdominal pain in the right upper quadrant, Abdominal                        pain in the right lower quadrant Providers:            Lollie Sails, MD Referring MD:         No Local Md, MD (Referring MD) Medicines:            Monitored Anesthesia Care Complications:        No immediate complications. Procedure:            Pre-Anesthesia Assessment:                       - ASA Grade Assessment: III - A patient with severe                        systemic disease.                       After obtaining informed consent, the endoscope was                        passed under direct vision. Throughout the procedure,                        the patient's blood pressure, pulse, and oxygen                        saturations were monitored continuously. The Endoscope                        was introduced through the mouth, and advanced to the                        third part of duodenum. The upper GI endoscopy was                        accomplished without difficulty. The patient tolerated                        the procedure well. Findings:      The Z-line was irregular. Biopsies were taken with a cold forceps for       histology.      A small hiatal hernia was found. The Z-line was a variable distance from       incisors; the hiatal hernia was sliding.      The exam of the esophagus was otherwise normal.      Diffuse minimal inflammation characterized by erythema was found in the       gastric body. Biopsies were taken with a cold forceps for histology.      Biopsies were taken with a cold forceps in the gastric antrum for       histology.      The cardia and  gastric  fundus were normal on retroflexion otherwise.      The examined duodenum was normal. Impression:           - Z-line irregular. Biopsied.                       - Small hiatal hernia.                       - Gastritis. Biopsied.                       - Normal examined duodenum.                       - Biopsies were taken with a cold forceps for histology                        in the gastric antrum. Recommendation:       - Perform a colonoscopy today. Procedure Code(s):    --- Professional ---                       [phone], Esophagogastroduodenoscopy, flexible, transoral;                        with biopsy, single or multiple Diagnosis Code(s):    --- Professional ---                       K22.8, Other specified diseases of esophagus                       K44.9, Diaphragmatic hernia without obstruction or                        gangrene                       K29.70, Gastritis, unspecified, without bleeding                       R10.11, Right upper quadrant pain                       R10.31, Right lower quadrant pain CPT copyright 2019 American Medical Association. All rights reserved. The codes documented in this report are preliminary and upon coder review may  be revised to meet current compliance requirements. Lollie Sails, MD 07/16/2019 9:29:11 AM This report has been signed electronically. Number of Addenda: 0 Note Initiated On: 07/16/2019 7:35 AM      [facility]

## 2019-07-17 ENCOUNTER — Encounter: Payer: Self-pay | Admitting: Gastroenterology

## 2019-07-17 LAB — SURGICAL PATHOLOGY

## 2019-09-18 ENCOUNTER — Other Ambulatory Visit: Payer: Self-pay | Admitting: Family Medicine

## 2019-09-19 ENCOUNTER — Other Ambulatory Visit: Payer: Self-pay | Admitting: Family Medicine

## 2019-09-19 DIAGNOSIS — Z780 Asymptomatic menopausal state: Secondary | ICD-10-CM

## 2019-10-02 ENCOUNTER — Other Ambulatory Visit: Payer: Self-pay

## 2019-10-02 ENCOUNTER — Encounter (INDEPENDENT_AMBULATORY_CARE_PROVIDER_SITE_OTHER): Payer: Self-pay

## 2019-10-02 ENCOUNTER — Ambulatory Visit
Admission: RE | Admit: 2019-10-02 | Discharge: 2019-10-02 | Disposition: A | Discharge status: Home / Self Care | Payer: Medicare HMO | Source: Ambulatory Visit | Attending: Family Medicine | Admitting: Family Medicine

## 2019-10-02 DIAGNOSIS — Z780 Asymptomatic menopausal state: Secondary | ICD-10-CM | POA: Diagnosis present

## 2019-11-23 HISTORY — PX: TOTAL SHOULDER REPLACEMENT: SUR1217

## 2020-02-25 ENCOUNTER — Other Ambulatory Visit: Payer: Self-pay

## 2020-02-25 ENCOUNTER — Encounter (HOSPITAL_COMMUNITY): Payer: Self-pay

## 2020-02-25 ENCOUNTER — Emergency Department (HOSPITAL_COMMUNITY): Payer: Medicare HMO

## 2020-02-25 ENCOUNTER — Emergency Department (HOSPITAL_COMMUNITY)
Admission: EM | Admit: 2020-02-25 | Discharge: 2020-02-25 | Disposition: A | Discharge status: Home / Self Care | Payer: Medicare HMO | Attending: Emergency Medicine | Admitting: Emergency Medicine

## 2020-02-25 ENCOUNTER — Emergency Department (HOSPITAL_BASED_OUTPATIENT_CLINIC_OR_DEPARTMENT_OTHER): Payer: Medicare HMO

## 2020-02-25 DIAGNOSIS — E119 Type 2 diabetes mellitus without complications: Secondary | ICD-10-CM | POA: Insufficient documentation

## 2020-02-25 DIAGNOSIS — M25552 Pain in left hip: Secondary | ICD-10-CM | POA: Insufficient documentation

## 2020-02-25 DIAGNOSIS — I10 Essential (primary) hypertension: Secondary | ICD-10-CM | POA: Insufficient documentation

## 2020-02-25 DIAGNOSIS — M25551 Pain in right hip: Secondary | ICD-10-CM | POA: Diagnosis present

## 2020-02-25 DIAGNOSIS — Z79899 Other long term (current) drug therapy: Secondary | ICD-10-CM | POA: Insufficient documentation

## 2020-02-25 DIAGNOSIS — Z856 Personal history of leukemia: Secondary | ICD-10-CM | POA: Insufficient documentation

## 2020-02-25 DIAGNOSIS — M79609 Pain in unspecified limb: Secondary | ICD-10-CM | POA: Diagnosis not present

## 2020-02-25 LAB — COMPREHENSIVE METABOLIC PANEL
ALT: 14 U/L (ref 0–44)
AST: 27 U/L (ref 15–41)
Albumin: 3.6 g/dL (ref 3.5–5.0)
Alkaline Phosphatase: 54 U/L (ref 38–126)
Anion gap: 11 (ref 5–15)
BUN: 24 mg/dL — ABNORMAL HIGH (ref 8–23)
CO2: 22 mmol/L (ref 22–32)
Calcium: 9.6 mg/dL (ref 8.9–10.3)
Chloride: 107 mmol/L (ref 98–111)
Creatinine, Ser: 0.93 mg/dL (ref 0.44–1.00)
GFR calc Af Amer: >60 mL/min (ref >60)
GFR calc non Af Amer: 60 mL/min — ABNORMAL LOW (ref >60)
Glucose, Bld: 90 mg/dL (ref 70–99)
Potassium: 4.1 mmol/L (ref 3.5–5.1)
Sodium: 140 mmol/L (ref 135–145)
Total Bilirubin: 0.4 mg/dL (ref 0.3–1.2)
Total Protein: 5.9 g/dL — ABNORMAL LOW (ref 6.5–8.1)

## 2020-02-25 LAB — URINALYSIS, ROUTINE W REFLEX MICROSCOPIC
Bilirubin Urine: NEGATIVE
Glucose, UA: NEGATIVE mg/dL
Hgb urine dipstick: NEGATIVE
Ketones, ur: 5 mg/dL — AB
Nitrite: NEGATIVE
Protein, ur: 30 mg/dL — AB
Specific Gravity, Urine: 1.027 (ref 1.005–1.030)
pH: 5 (ref 5.0–8.0)

## 2020-02-25 LAB — CBC WITH DIFFERENTIAL/PLATELET
Abs Immature Granulocytes: 0.03 10*3/uL (ref 0.00–0.07)
Basophils Absolute: 0 10*3/uL (ref 0.0–0.1)
Basophils Relative: 0 %
Eosinophils Absolute: 0.2 10*3/uL (ref 0.0–0.5)
Eosinophils Relative: 2 %
HCT: 36.8 % (ref 36.0–46.0)
Hemoglobin: 11.2 g/dL — ABNORMAL LOW (ref 12.0–15.0)
Immature Granulocytes: 0 %
Lymphocytes Relative: 52 %
Lymphs Abs: 4.3 10*3/uL — ABNORMAL HIGH (ref 0.7–4.0)
MCH: 32.4 pg (ref 26.0–34.0)
MCHC: 30.4 g/dL (ref 30.0–36.0)
MCV: 106.4 fL — ABNORMAL HIGH (ref 80.0–100.0)
Monocytes Absolute: 0.8 10*3/uL (ref 0.1–1.0)
Monocytes Relative: 9 %
Neutro Abs: 3.2 10*3/uL (ref 1.7–7.7)
Neutrophils Relative %: 37 %
Platelets: 250 10*3/uL (ref 150–400)
RBC: 3.46 MIL/uL — ABNORMAL LOW (ref 3.87–5.11)
RDW: 12.8 % (ref 11.5–15.5)
WBC: 8.5 10*3/uL (ref 4.0–10.5)
nRBC: 0 % (ref 0.0–0.2)

## 2020-02-25 LAB — LACTIC ACID, PLASMA: Lactic Acid, Venous: 1.4 mmol/L (ref 0.5–1.9)

## 2020-02-25 MED ORDER — FENTANYL CITRATE (PF) 100 MCG/2ML IJ SOLN
50.0000 ug | Freq: Once | INTRAMUSCULAR | Status: AC
  Administered 2020-02-25: 50 ug via INTRAVENOUS
  Filled 2020-02-25: qty 2

## 2020-02-25 MED ORDER — OXYCODONE-ACETAMINOPHEN 5-325 MG PO TABS: 1.0000 | ORAL_TABLET | Freq: Four times a day (QID) | ORAL | 0 refills | Status: AC | PRN

## 2020-02-25 MED ORDER — DICLOFENAC SODIUM 1 % EX GEL: 2.0000 g | Freq: Four times a day (QID) | CUTANEOUS | 0 refills | Status: DC | PRN

## 2020-02-25 NOTE — ED Notes (Signed)
Patient verbalizes understanding of discharge instructions. Opportunity for questioning and answers were provided. Armband removed by staff, pt discharged from ED.  

## 2020-02-25 NOTE — ED Notes (Addendum)
Unsuccessful attempt to get blood work from left hand

## 2020-02-25 NOTE — ED Provider Notes (Signed)
[facility] EMERGENCY DEPARTMENT Provider Note   CSN: [id number] Arrival date & time: 02/25/20  1046     History Chief Complaint  Patient presents with  . Groin Pain  . Leg Pain    Pamela Cox is a 79 y.o. female.  The history is provided by medical records and the patient. No language interpreter was used.  Leg Pain Location:  Hip Time since incident:  1 week Injury: no   Hip location:  L hip and R hip Pain details:    Quality:  Aching   Radiates to:  Does not radiate   Severity:  Moderate   Onset quality:  Gradual   Timing:  Constant   Progression:  Waxing and waning Chronicity:  Recurrent Dislocation: no   Tetanus status:  Unknown Prior injury to area:  No Relieved by:  Nothing Worsened by:  Nothing Ineffective treatments:  None tried Associated symptoms: no back pain, no decreased ROM, no fatigue, no fever, no itching, no muscle weakness, no neck pain, no numbness, no swelling and no tingling        Past Medical History:  Diagnosis Date  . Arthritis   . CML (chronic myelocytic leukemia) ([place])   . CVA (cerebral vascular accident) ([place])   . Diabetes mellitus without complication ([place])   . GERD (gastroesophageal reflux disease)   . Hypertension   . Myelocytic leukemia, chronic ([place])   . Obesity   . Sleep apnea     Patient Active Problem List   Diagnosis Date Noted  . Allergic rhinitis due to pollen 08/23/2018  . DDD (degenerative disc disease), lumbar 08/23/2018  . Lumbar neuritis 08/23/2018  . 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  . GERD (gastroesophageal reflux disease) 08/23/2018  . Morbid obesity ([place]) 08/23/2018  . Obstructive sleep apnea 08/23/2018  . Intervertebral disc disorder with radiculopathy of lumbar region 08/23/2018  . CML (chronic myelocytic leukemia) ([place]) 08/23/2018  . Hx of myocardial perfusion scan 08/23/2018  . Osteoarthritis 08/23/2018  . Degenerative arthritis of hip 08/23/2018  . Lumbar spondylosis 08/23/2018   . SOB (shortness of breath) 08/23/2018  . History of CVA (cerebrovascular accident) 08/23/2018  . Essential hypertension 08/23/2018  . Chronic right shoulder pain 08/23/2018  . Complete tear of right rotator cuff 08/23/2018  . Acute pain of left shoulder 08/23/2018  . On antineoplastic chemotherapy 08/23/2018  . Hyperlipidemia 08/23/2018  . Neurogenic claudication due to lumbar spinal stenosis 08/23/2018  . Vitamin D deficiency 08/23/2018    Past Surgical History:  Procedure Laterality Date  . APPENDECTOMY    . COLONOSCOPY    . COLONOSCOPY WITH PROPOFOL N/A 07/16/2019   Procedure: COLONOSCOPY WITH PROPOFOL;  Surgeon: Lollie Sails, MD;  Location[facility] ENDOSCOPY;  Service: Endoscopy;  Laterality: N/A;  . ESOPHAGOGASTRODUODENOSCOPY (EGD) WITH PROPOFOL N/A 07/16/2019   Procedure: ESOPHAGOGASTRODUODENOSCOPY (EGD) WITH PROPOFOL;  Surgeon: Lollie Sails, MD;  Location: [facility] ENDOSCOPY;  Service: Endoscopy;  Laterality: N/A;  . TUBAL LIGATION       OB History   No obstetric history on file.     Family History  Problem Relation Age of Onset  . Breast cancer Neg Hx     Social History   Tobacco Use  . Smoking status: Never Smoker  . Smokeless tobacco: Never Used  Substance Use Topics  . Alcohol use: No  . Drug use: Never    Home Medications Prior to Admission medications   Medication Sig Start Date End Date Taking? Authorizing Provider  amLODipine (NORVASC)  10 MG tablet Take 10 mg by mouth daily.    [provider]  atorvastatin (LIPITOR) 40 MG tablet Take 40 mg by mouth daily.    [provider]  dasatinib (SPRYCEL) 70 MG tablet Take 80 mg by mouth daily.    [provider]  diclofenac (VOLTAREN) 50 MG EC tablet Take 1 tablet (50 mg total) by mouth 2 (two) times daily. 09/16/15   Melynda Ripple, MD  diclofenac sodium (VOLTAREN) 1 % GEL Apply topically 2 (two) times daily.    [provider]  dipyridamole-aspirin (AGGRENOX)  200-25 MG 12hr capsule Take 1 capsule by mouth daily.    [provider]  Fexofenadine HCl (ALLEGRA PO) Take 180 mg by mouth 1 day or 1 dose.     [provider]  fluticasone (FLONASE) 50 MCG/ACT nasal spray Place 2 sprays into both nostrils daily.    [provider]  furosemide (LASIX) 20 MG tablet Take 20 mg by mouth daily.    [provider]  gabapentin (NEURONTIN) 300 MG capsule Take 300 mg by mouth 2 (two) times daily.    [provider]  hydrochlorothiazide (HYDRODIURIL) 25 MG tablet Take 25 mg by mouth daily.    [provider]  HYDROcodone-acetaminophen (NORCO/VICODIN) 5-325 MG tablet Take 1-2 tablets by mouth every 4 (four) hours as needed for moderate pain. Patient not taking: Reported on 07/16/2019 09/16/15   Melynda Ripple, MD  labetalol (NORMODYNE) 300 MG tablet Take 300 mg by mouth 2 (two) times daily.    [provider]  latanoprost (XALATAN) 0.005 % ophthalmic solution Place 1 drop into both eyes at bedtime.    [provider]  losartan (COZAAR) 100 MG tablet Take 100 mg by mouth daily.    [provider]  magnesium oxide (MAG-OX) 400 MG tablet Take 400 mg by mouth daily.    [provider]  metaxalone (SKELAXIN) 800 MG tablet Take 1 tablet (800 mg total) by mouth 3 (three) times daily. 09/16/15   Melynda Ripple, MD  omeprazole (PRILOSEC) 40 MG capsule Take 40 mg by mouth daily.    [provider]  spironolactone (ALDACTONE) 25 MG tablet Take 25 mg by mouth daily.    [provider]    Allergies    Patient has no known allergies.  Review of Systems   Review of Systems  Constitutional: Negative for chills, fatigue and fever.  HENT: Negative for congestion.   Respiratory: Negative for cough and shortness of breath.   Cardiovascular: Negative for chest pain.  Gastrointestinal: Negative for constipation, diarrhea, nausea and vomiting.  Genitourinary: Negative for  dysuria, flank pain and frequency.  Musculoskeletal: Negative for back pain, neck pain and neck stiffness.  Skin: Negative for itching and wound.  Neurological: Negative for dizziness, weakness, light-headedness, numbness and headaches.  Psychiatric/Behavioral: Negative for agitation.  All other systems reviewed and are negative.   Physical Exam Updated Vital Signs BP 127/62   Pulse 62   Temp 97.8 F (36.6 C) (Oral)   Resp 18   Ht 5\' 7"  (1.702 m)   Wt 104.8 kg   SpO2 98%   BMI 36.18 kg/m   Physical Exam Vitals and nursing note reviewed.  Constitutional:      General: She is not in acute distress.    Appearance: She is well-developed. She is not ill-appearing, toxic-appearing or diaphoretic.  HENT:     Head: Normocephalic and atraumatic.     Right Ear: External ear normal.  Left Ear: External ear normal.     Nose: Nose normal. No congestion or rhinorrhea.     Mouth/Throat:     Mouth: Mucous membranes are moist.     Pharynx: No oropharyngeal exudate or posterior oropharyngeal erythema.  Eyes:     Conjunctiva/sclera: Conjunctivae normal.     Pupils: Pupils are equal, round, and reactive to light.  Cardiovascular:     Rate and Rhythm: Normal rate.     Pulses: Normal pulses.     Heart sounds: No murmur.  Pulmonary:     Effort: Pulmonary effort is normal. No respiratory distress.     Breath sounds: No stridor. No wheezing, rhonchi or rales.  Chest:     Chest wall: No tenderness.  Abdominal:     General: Abdomen is flat. There is no distension.     Tenderness: There is no abdominal tenderness. There is no right CVA tenderness, left CVA tenderness, guarding or rebound.  Musculoskeletal:        General: Tenderness present. No signs of injury.     Cervical back: Normal range of motion and neck supple. No tenderness.       Legs:     Comments: Normal sensation and strength in legs.  Normal pulses.  Tenderness in the anterior hips bilaterally.  Negative straight leg raise  bilaterally.  No back tenderness and no abdominal tenderness otherwise.  Skin:    General: Skin is warm.     Coloration: Skin is not pale.     Findings: No erythema or rash.  Neurological:     General: No focal deficit present.     Mental Status: She is alert and oriented to person, place, and time.     Sensory: No sensory deficit.     Motor: No weakness or abnormal muscle tone.     Deep Tendon Reflexes: Reflexes are normal and symmetric.  Psychiatric:        Mood and Affect: Mood normal.     ED Results / Procedures / Treatments   Labs (all labs ordered are listed, but only abnormal results are displayed) Labs Reviewed  URINE CULTURE  CBC WITH DIFFERENTIAL/PLATELET  COMPREHENSIVE METABOLIC PANEL  URINALYSIS, ROUTINE W REFLEX MICROSCOPIC  LACTIC ACID, PLASMA  LACTIC ACID, PLASMA    EKG None  Radiology DG Hips Bilat W or Wo Pelvis 5 Views  Result Date: 02/25/2020 CLINICAL DATA:  Bilateral pelvic pain. EXAM: DG HIP (WITH OR WITHOUT PELVIS) 5+V BILAT COMPARISON:  None. FINDINGS: There is no evidence of hip fracture or dislocation. There is no evidence of arthropathy or other focal bone abnormality. Lower lumbar spine spondylosis. IMPRESSION: No acute osseous injury of the bilateral hips. Electronically Signed   By: Kathreen Devoid   On: 02/25/2020 15:24   VAS [place] LOWER EXTREMITY VENOUS (DVT) (ONLY [facility] & [facility])  Result Date: 02/25/2020  Lower Venous DVTStudy Other Indications: Patient presents with intermittent pain in both legs. Past                    history of Sciatica nerve pain. Comparison Study: No recent study available. Last LEV 2013. Performing Technologist: Oda Cogan RDMS, RVT  Examination Guidelines: A complete evaluation includes B-mode imaging, spectral Doppler, color Doppler, and power Doppler as needed of all accessible portions of each vessel. Bilateral testing is considered an integral part of a complete examination. Limited examinations for reoccurring indications may  be performed as noted. The reflux portion of the exam is performed with the  patient in reverse Trendelenburg.  +---------+---------------+---------+-----------+----------+--------------+ RIGHT    CompressibilityPhasicitySpontaneityPropertiesThrombus Aging +---------+---------------+---------+-----------+----------+--------------+ CFV      Full           Yes      Yes                                 +---------+---------------+---------+-----------+----------+--------------+ SFJ      Full                                                        +---------+---------------+---------+-----------+----------+--------------+ FV Prox  Full                                                        +---------+---------------+---------+-----------+----------+--------------+ FV Mid   Full                                                        +---------+---------------+---------+-----------+----------+--------------+ FV DistalFull                                                        +---------+---------------+---------+-----------+----------+--------------+ PFV      Full                                                        +---------+---------------+---------+-----------+----------+--------------+ POP      Full           Yes      Yes                                 +---------+---------------+---------+-----------+----------+--------------+ PTV      Full                                                        +---------+---------------+---------+-----------+----------+--------------+ PERO     Full                                                        +---------+---------------+---------+-----------+----------+--------------+   +---------+---------------+---------+-----------+----------+--------------+ LEFT     CompressibilityPhasicitySpontaneityPropertiesThrombus Aging +---------+---------------+---------+-----------+----------+--------------+ CFV       Full           Yes      Yes                                 +---------+---------------+---------+-----------+----------+--------------+  SFJ      Full                                                        +---------+---------------+---------+-----------+----------+--------------+ FV Prox  Full                                                        +---------+---------------+---------+-----------+----------+--------------+ FV Mid   Full                                                        +---------+---------------+---------+-----------+----------+--------------+ FV DistalFull                                                        +---------+---------------+---------+-----------+----------+--------------+ PFV      Full                                                        +---------+---------------+---------+-----------+----------+--------------+ POP      Full           Yes      Yes                                 +---------+---------------+---------+-----------+----------+--------------+ PTV      Full                                                        +---------+---------------+---------+-----------+----------+--------------+ PERO     Full                                                        +---------+---------------+---------+-----------+----------+--------------+     Summary: RIGHT: - There is no evidence of deep vein thrombosis in the lower extremity.  - No cystic structure found in the popliteal fossa.  LEFT: - There is no evidence of deep vein thrombosis in the lower extremity.  - No cystic structure found in the popliteal fossa.  *See table(s) above for measurements and observations.    Preliminary     Procedures Procedures (including critical care time)  Medications Ordered in ED Medications  fentaNYL (SUBLIMAZE) injection 50 mcg (50 mcg Intravenous Given 02/25/20 1519)    ED Course  I have reviewed the triage vital signs and  the nursing notes.  Pertinent labs & imaging results that were available during my care of the patient were reviewed by me and considered in my medical decision making (see chart for details).    MDM Rules/Calculators/A&P                      Lashira Sophronia Herrada is a 79 y.o. female with a past medical history significant for CML currently being managed at [facility], obesity, GERD, prior stroke, hyperlipidemia, and chronic pains who presents with bilateral hip pains.  Patient reports that for the last few days she is having pain that was in her right anterior hip/groin area that radiates to the left side.  She reports this is different than some of the pain she is had in the past.  She denies any new falls or trauma.  She denies any pain radiating down the backs of her legs.  No numbness, tingling, or weakness reported.  No recent fevers, chills, chest pain, shortness of breath, nausea, or vomiting.  She denies any other complaints.  On exam, patient has some tenderness in her anterior proximal legs.  Mild tenderness with hip movement.  No significant leg edema or tenderness distally.  Normal sensation strength and pulses distally.  Negative straight leg raise bilaterally.  No low back tenderness on my exam.  No CVA tenderness.  Lungs clear and chest and abdomen nontender.  Patient resting comfortably.  Clinically I suspect the patient may be having pelvic or proximal femur pain due to her leukemia and treatments.  Lower suspicion for DVT however given her cancer history we will get ultrasounds.  Will also get screening blood work given the reported cramping sensation in her extremities to look for electrolyte imbalance.  If x-rays, ultrasound, and labs are reassuring, anticipate discharge home to follow-up with her [facility] team this week.  Family is in agreement to the plan on my initial evaluation.  Care transferred to oncoming team while awaiting for work-up results.  Anticipate discharge after  work-up.   Final Clinical Impression(s) / ED Diagnoses Final diagnoses:  Bilateral hip pain    Clinical Impression: 1. Bilateral hip pain      Disposition: Care transferred to oncoming team while awaiting for work-up results.  Anticipate discharge after work-up.   This note was prepared with assistance of Systems analyst. Occasional wrong-word or sound-a-like substitutions may have occurred due to the inherent limitations of voice recognition software.      Evelene Roussin, Gwenyth Allegra, MD 02/25/20 [phone]

## 2020-02-25 NOTE — ED Triage Notes (Signed)
Pt reports pain that started in her right groin that has now radiated to her left groin and down her left leg.

## 2020-02-25 NOTE — Progress Notes (Signed)
Lower ext venous duplex  has been completed. Refer to [facility] under chart review to view preliminary results.   02/25/2020  2:50 PM Winfred Redel, Bonnye Fava

## 2020-02-25 NOTE — Discharge Instructions (Addendum)
He was seen in the emergency department today with leg/hip pain.  Your x-rays, ultrasound, lab work are reassuring.  Please contact your primary care doctor for further pain control measures moving forward along with your leukemia doctors at [facility].  If you develop sudden new or worsening symptoms you should return to the emergency department.

## 2020-02-25 NOTE — ED Provider Notes (Signed)
Blood pressure 127/62, pulse 62, temperature 97.8 F (36.6 C), temperature source Oral, resp. rate 18, height 5\' 7"  (1.702 m), weight 104.8 kg, SpO2 98 %.  Assuming care from Dr. Sherry Ruffing.  In short, Pamela Cox is a 79 y.o. female with a chief complaint of Groin Pain and Leg Pain .  Refer to the original H&P for additional details.  The current plan of care is to f/u on imaging and labs.  05:57 PM   patient's imaging including ultrasound and plain films reviewed with no acute findings.  Blood work and UA reviewed.  Patient with trace leukocytes and rare bacteria.  We will send this for culture.  Patient is not having significant UTI symptoms at this time to prompt treatment.  Plan for her to follow with her primary care doctor as well as her CML doctors at [facility].  She plans to call their office tomorrow for the next available appointment.  I did review the [place] drug database and prescribe a small amount of Percocet for severe/breakthrough pain.  We discussed not taking this along with Flexeril or gabapentin as it can cause excess drowsiness.  She has tolerated this medication well in the past.  I discussed that further pain medications will need to come from her primary care specialist physicians moving forward.  Discussed ED return precautions in detail.     Margette Fast, MD 02/25/20 1759

## 2020-02-26 LAB — URINE CULTURE: Culture: 10000 — AB

## 2020-03-02 ENCOUNTER — Emergency Department (HOSPITAL_COMMUNITY)
Admission: EM | Admit: 2020-03-02 | Discharge: 2020-03-03 | Disposition: A | Discharge status: Home / Self Care | Payer: Medicare HMO | Attending: Emergency Medicine | Admitting: Emergency Medicine

## 2020-03-02 ENCOUNTER — Emergency Department (HOSPITAL_COMMUNITY): Payer: Medicare HMO

## 2020-03-02 ENCOUNTER — Other Ambulatory Visit: Payer: Self-pay

## 2020-03-02 DIAGNOSIS — Y929 Unspecified place or not applicable: Secondary | ICD-10-CM | POA: Diagnosis not present

## 2020-03-02 DIAGNOSIS — Y999 Unspecified external cause status: Secondary | ICD-10-CM | POA: Diagnosis not present

## 2020-03-02 DIAGNOSIS — W19XXXA Unspecified fall, initial encounter: Secondary | ICD-10-CM

## 2020-03-02 DIAGNOSIS — M25521 Pain in right elbow: Secondary | ICD-10-CM | POA: Insufficient documentation

## 2020-03-02 DIAGNOSIS — E119 Type 2 diabetes mellitus without complications: Secondary | ICD-10-CM | POA: Insufficient documentation

## 2020-03-02 DIAGNOSIS — S43004A Unspecified dislocation of right shoulder joint, initial encounter: Secondary | ICD-10-CM

## 2020-03-02 DIAGNOSIS — W108XXA Fall (on) (from) other stairs and steps, initial encounter: Secondary | ICD-10-CM | POA: Diagnosis not present

## 2020-03-02 DIAGNOSIS — Z7984 Long term (current) use of oral hypoglycemic drugs: Secondary | ICD-10-CM | POA: Insufficient documentation

## 2020-03-02 DIAGNOSIS — Z8673 Personal history of transient ischemic attack (TIA), and cerebral infarction without residual deficits: Secondary | ICD-10-CM | POA: Diagnosis not present

## 2020-03-02 DIAGNOSIS — E860 Dehydration: Secondary | ICD-10-CM

## 2020-03-02 DIAGNOSIS — S0990XA Unspecified injury of head, initial encounter: Secondary | ICD-10-CM | POA: Diagnosis not present

## 2020-03-02 DIAGNOSIS — Z79899 Other long term (current) drug therapy: Secondary | ICD-10-CM | POA: Diagnosis not present

## 2020-03-02 DIAGNOSIS — S59901A Unspecified injury of right elbow, initial encounter: Secondary | ICD-10-CM | POA: Insufficient documentation

## 2020-03-02 DIAGNOSIS — S43014A Anterior dislocation of right humerus, initial encounter: Secondary | ICD-10-CM | POA: Diagnosis not present

## 2020-03-02 DIAGNOSIS — Y9389 Activity, other specified: Secondary | ICD-10-CM | POA: Diagnosis not present

## 2020-03-02 DIAGNOSIS — S4991XA Unspecified injury of right shoulder and upper arm, initial encounter: Secondary | ICD-10-CM | POA: Diagnosis present

## 2020-03-02 MED ORDER — FENTANYL CITRATE (PF) 100 MCG/2ML IJ SOLN
50.0000 ug | Freq: Once | INTRAMUSCULAR | Status: AC
  Administered 2020-03-03: 50 ug via INTRAVENOUS
  Filled 2020-03-02: qty 2

## 2020-03-02 MED ORDER — ONDANSETRON HCL 4 MG/2ML IJ SOLN
4.0000 mg | Freq: Once | INTRAMUSCULAR | Status: AC
  Administered 2020-03-03: 4 mg via INTRAVENOUS
  Filled 2020-03-02: qty 2

## 2020-03-02 NOTE — ED Triage Notes (Signed)
Pt BIB [facility] from home c/o right shoulder pain post mechanical fall down 8 steps. Pt states she felt her 'legs give out.' No LOC. No blood thinner. No deformity noted. A&OX4.

## 2020-03-02 NOTE — ED Notes (Signed)
Pt remains at imaging 

## 2020-03-02 NOTE — ED Provider Notes (Signed)
[facility] EMERGENCY DEPARTMENT Provider Note   CSN: [id number] Arrival date & time: 03/02/20  2156     History Chief Complaint  Patient presents with  . Shoulder Pain    right  . Fall    Pamela Cox is a 79 y.o. female past medical story of CML, CVA, diabetes, GERD, hypertension who presents from home for evaluation of right shoulder pain status post mechanical fall.  She reports that about 6 PM, she was going down her steps and states that she felt like her legs gave out and caused her to miss a step.  She reports that she fell down about 8 steps.  She states she went backwards and hit her head.  She did not lose consciousness.  Patient states she also landed on her right shoulder.  She reports that since then, she has had pain, difficulty moving the right shoulder.  She states she has a history of rotator cuff deformity noted to the right shoulder but she has not got it fixed yet.  Patient reports pain is worse when she tries to move it.  She states that she is on Aggrenox.  Patient states she did not lose consciousness but she does think she hit her head.  Patient states that she had trouble getting up because she could not push herself up on her right arm.  She states that she can still move her legs.  She denies any preceding chest pain, dizziness.  Denies any neck pain, back pain, chest pain, difficulty breathing, abdominal pain, nausea/vomiting, numbness/weakness of her arms or legs.  I discussed with daughter who is at bedside.  She reports that patient has not been feeling good for last several days.  She was seen in the ED on 02/25/2020 and was discharged home.  She was complaining of some side pain.  They suspect it is most likely sciatica.  She went and saw Ortho today.  She states that she felt tired, weak and felt nauseous and so her daughter took her to urgent care.  They tested her for Covid and flu and she did come back positive for flu.  She states that  patient has not had much of an appetite but has not been vomiting.  She states that patient was feeling weak and tired.  She was supposed to stay downstairs and sleep on the couch but patient attempted to go up the stairs.  She reports that she lost her balance because she could not grip the stairwell which is what caused her to fall down.  Daughter did not actually see the fall but was in the next room and heard her fall down.  She denies any LOC.   The history is provided by the patient.       Past Medical History:  Diagnosis Date  . Arthritis   . CML (chronic myelocytic leukemia) ([place])   . CVA (cerebral vascular accident) ([place])   . Diabetes mellitus without complication ([place])   . GERD (gastroesophageal reflux disease)   . Hypertension   . Myelocytic leukemia, chronic ([place])   . Obesity   . Sleep apnea     Patient Active Problem List   Diagnosis Date Noted  . Allergic rhinitis due to pollen 08/23/2018  . DDD (degenerative disc disease), lumbar 08/23/2018  . Lumbar neuritis 08/23/2018  . 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  . GERD (gastroesophageal reflux disease) 08/23/2018  . Morbid obesity ([place]) 08/23/2018  . Obstructive sleep apnea 08/23/2018  . Intervertebral  disc disorder with radiculopathy of lumbar region 08/23/2018  . CML (chronic myelocytic leukemia) ([place]) 08/23/2018  . Hx of myocardial perfusion scan 08/23/2018  . Osteoarthritis 08/23/2018  . Degenerative arthritis of hip 08/23/2018  . Lumbar spondylosis 08/23/2018  . SOB (shortness of breath) 08/23/2018  . History of CVA (cerebrovascular accident) 08/23/2018  . Essential hypertension 08/23/2018  . Chronic right shoulder pain 08/23/2018  . Complete tear of right rotator cuff 08/23/2018  . Acute pain of left shoulder 08/23/2018  . On antineoplastic chemotherapy 08/23/2018  . Hyperlipidemia 08/23/2018  . Neurogenic claudication due to lumbar spinal stenosis 08/23/2018  . Vitamin D deficiency 08/23/2018    Past  Surgical History:  Procedure Laterality Date  . APPENDECTOMY    . COLONOSCOPY    . COLONOSCOPY WITH PROPOFOL N/A 07/16/2019   Procedure: COLONOSCOPY WITH PROPOFOL;  Surgeon: Lollie Sails, MD;  Location[facility] ENDOSCOPY;  Service: Endoscopy;  Laterality: N/A;  . ESOPHAGOGASTRODUODENOSCOPY (EGD) WITH PROPOFOL N/A 07/16/2019   Procedure: ESOPHAGOGASTRODUODENOSCOPY (EGD) WITH PROPOFOL;  Surgeon: Lollie Sails, MD;  Location: [facility] ENDOSCOPY;  Service: Endoscopy;  Laterality: N/A;  . TUBAL LIGATION       OB History   No obstetric history on file.     Family History  Problem Relation Age of Onset  . Breast cancer Neg Hx     Social History   Tobacco Use  . Smoking status: Never Smoker  . Smokeless tobacco: Never Used  Substance Use Topics  . Alcohol use: No  . Drug use: Never    Home Medications Prior to Admission medications   Medication Sig Start Date End Date Taking? Authorizing Provider  amLODipine (NORVASC) 10 MG tablet Take 10 mg by mouth daily.    [provider]  atorvastatin (LIPITOR) 40 MG tablet Take 40 mg by mouth daily.    [provider]  dasatinib (SPRYCEL) 70 MG tablet Take 80 mg by mouth daily.    [provider]  diclofenac (VOLTAREN) 50 MG EC tablet Take 1 tablet (50 mg total) by mouth 2 (two) times daily. 09/16/15   Melynda Ripple, MD  diclofenac Sodium (VOLTAREN) 1 % GEL Apply 2 g topically 4 (four) times daily as needed. 02/25/20   Long, Wonda Olds, MD  dipyridamole-aspirin (AGGRENOX) 200-25 MG 12hr capsule Take 1 capsule by mouth daily.    [provider]  Fexofenadine HCl (ALLEGRA PO) Take 180 mg by mouth 1 day or 1 dose.     [provider]  fluticasone (FLONASE) 50 MCG/ACT nasal spray Place 2 sprays into both nostrils daily.    [provider]  furosemide (LASIX) 20 MG tablet Take 20 mg by mouth daily.    [provider]  gabapentin (NEURONTIN) 300 MG capsule Take 300 mg by mouth 2 (two)  times daily.    [provider]  hydrochlorothiazide (HYDRODIURIL) 25 MG tablet Take 25 mg by mouth daily.    [provider]  HYDROcodone-acetaminophen (NORCO/VICODIN) 5-325 MG tablet Take 1-2 tablets by mouth every 4 (four) hours as needed for moderate pain. Patient not taking: Reported on 07/16/2019 09/16/15   Melynda Ripple, MD  labetalol (NORMODYNE) 300 MG tablet Take 300 mg by mouth 2 (two) times daily.    [provider]  latanoprost (XALATAN) 0.005 % ophthalmic solution Place 1 drop into both eyes at bedtime.    [provider]  losartan (COZAAR) 100 MG tablet Take 100 mg by mouth daily.    [provider]  magnesium  oxide (MAG-OX) 400 MG tablet Take 400 mg by mouth daily.    [provider]  metaxalone (SKELAXIN) 800 MG tablet Take 1 tablet (800 mg total) by mouth 3 (three) times daily. 09/16/15   Melynda Ripple, MD  omeprazole (PRILOSEC) 40 MG capsule Take 40 mg by mouth daily.    [provider]  spironolactone (ALDACTONE) 25 MG tablet Take 25 mg by mouth daily.    [provider]    Allergies    Patient has no known allergies.  Review of Systems   Review of Systems  Constitutional: Negative for fever.  Respiratory: Negative for cough and shortness of breath.   Cardiovascular: Negative for chest pain.  Gastrointestinal: Negative for abdominal pain, nausea and vomiting.  Genitourinary: Negative for dysuria and hematuria.  Musculoskeletal:       Right shoulder pain  Neurological: Negative for weakness, numbness and headaches.  All other systems reviewed and are negative.   Physical Exam Updated Vital Signs BP (!) 191/107   Pulse 80   Temp 98.4 F (36.9 C) (Oral)   Resp (!) 21   Ht 5\' 7"  (1.702 m)   Wt 104.8 kg   SpO2 96%   BMI 36.18 kg/m   Physical Exam Vitals and nursing note reviewed.  Constitutional:      Appearance: Normal appearance. She is well-developed.  HENT:     Head:  Normocephalic and atraumatic.     Comments: No tenderness to palpation of skull. No deformities or crepitus noted. No open wounds, abrasions or lacerations.  Eyes:     General: Lids are normal.     Conjunctiva/sclera: Conjunctivae normal.     Pupils: Pupils are equal, round, and reactive to light.     Comments: PERRL. EOMs intact. No nystagmus. No neglect.   Neck:     Comments: Full flexion/extension and lateral movement of neck fully intact. No bony midline tenderness. No deformities or crepitus.  Cardiovascular:     Rate and Rhythm: Normal rate and regular rhythm.     Pulses: Normal pulses.          Radial pulses are 2+ on the right side and 2+ on the left side.       Dorsalis pedis pulses are 2+ on the right side and 2+ on the left side.     Heart sounds: Normal heart sounds. No murmur. No friction rub. No gallop.   Pulmonary:     Effort: Pulmonary effort is normal.     Breath sounds: Normal breath sounds.     Comments: Lungs clear to auscultation bilaterally.  Symmetric chest rise.  No wheezing, rales, rhonchi. Abdominal:     Palpations: Abdomen is soft. Abdomen is not rigid.     Tenderness: There is no abdominal tenderness. There is no guarding.     Comments: Abdomen is soft, non-distended, non-tender. No rigidity, No guarding. No peritoneal signs.  Musculoskeletal:        General: Normal range of motion.     Cervical back: Full passive range of motion without pain.     Comments: Tenderness palpation of the right shoulder.  No deformity or crepitus noted.  Limited range of motion secondary to pain.  It is in a sling.  Bony tenderness noted to the right elbow.  No deformity or crepitus noted.  Limited range of motion secondary to pain of shoulder.  No bony tenderness noted to right forearm, right wrist.  No tenderness palpation in left shoulder, left elbow, left forearm,  left wrist.  No pelvic instability.  No bony tenderness noted bilateral lower extremities.  Flexion/tension of  bilateral lower extremities intact and difficulty.  Skin:    General: Skin is warm and dry.     Capillary Refill: Capillary refill takes less than 2 seconds.     Comments: Good distal cap refill.  RUE is not dusky in appearance or cool to touch.  Neurological:     Mental Status: She is alert and oriented to person, place, and time.     Comments: Cranial nerves III-XII intact Follows commands, Moves all extremities  5/5 strength to BUE and BLE  Sensation intact throughout all major nerve distributions No slurred speech. No facial droop.   Psychiatric:        Speech: Speech normal.     ED Results / Procedures / Treatments   Labs (all labs ordered are listed, but only abnormal results are displayed) Labs Reviewed  BASIC METABOLIC PANEL - Abnormal; Notable for the following components:      Result Value   Potassium 5.2 (*)    CO2 20 (*)    Glucose, Bld 154 (*)    BUN 25 (*)    Creatinine, Ser 1.09 (*)    GFR calc non Af Amer 49 (*)    GFR calc Af Amer 57 (*)    All other components within normal limits  CBC WITH DIFFERENTIAL/PLATELET - Abnormal; Notable for the following components:   WBC 15.6 (*)    RBC 3.80 (*)    MCV 101.8 (*)    Neutro Abs 13.9 (*)    Abs Immature Granulocytes 0.08 (*)    All other components within normal limits    EKG None  Radiology DG Shoulder Right  Result Date: 03/02/2020 CLINICAL DATA:  79 year old female with fall and right upper extremity pain. EXAM: RIGHT SHOULDER - 2+ VIEW; RIGHT ELBOW - COMPLETE 3+ VIEW COMPARISON:  None. FINDINGS: There is anterior dislocation of the right shoulder. There is focal area of lucency and irregularity involving the inferior bony glenoid most consistent with a Bankart injury. There is sclerotic changes of the superolateral humeral head consistent with a Hill-Sachs lesion. No definite acute fracture identified at the elbow. There is no dislocation. Evaluation of the elbow however is limited due to positioning. The  bones are osteopenic. No joint effusion. The soft tissues are unremarkable. IMPRESSION: 1. Anterior dislocation of the right shoulder with Hill-Sachs and Bankart lesions. 2. No definite acute fracture or dislocation of the elbow. Electronically Signed   By: Anner Crete M.D.   On: 03/02/2020 23:33   DG Elbow Complete Right  Result Date: 03/02/2020 CLINICAL DATA:  79 year old female with fall and right upper extremity pain. EXAM: RIGHT SHOULDER - 2+ VIEW; RIGHT ELBOW - COMPLETE 3+ VIEW COMPARISON:  None. FINDINGS: There is anterior dislocation of the right shoulder. There is focal area of lucency and irregularity involving the inferior bony glenoid most consistent with a Bankart injury. There is sclerotic changes of the superolateral humeral head consistent with a Hill-Sachs lesion. No definite acute fracture identified at the elbow. There is no dislocation. Evaluation of the elbow however is limited due to positioning. The bones are osteopenic. No joint effusion. The soft tissues are unremarkable. IMPRESSION: 1. Anterior dislocation of the right shoulder with Hill-Sachs and Bankart lesions. 2. No definite acute fracture or dislocation of the elbow. Electronically Signed   By: Anner Crete M.D.   On: 03/02/2020 23:33   CT Head Wo Contrast  Result Date: 03/02/2020 CLINICAL DATA:  Status post trauma. EXAM: CT HEAD WITHOUT CONTRAST TECHNIQUE: Contiguous axial images were obtained from the base of the skull through the vertex without intravenous contrast. COMPARISON:  October 10, 2019 FINDINGS: Brain: There is mild cerebral atrophy with widening of the extra-axial spaces and ventricular dilatation. There are areas of decreased attenuation within the white matter tracts of the supratentorial brain, consistent with microvascular disease changes. Chronic right basal ganglia lacunar infarcts are seen. Vascular: No hyperdense vessel or unexpected calcification. Skull: Normal. Negative for fracture or focal  lesion. Sinuses/Orbits: No acute finding. Other: None. IMPRESSION: 1. Generalized cerebral atrophy. 2. Chronic right basal ganglia lacunar infarcts. 3. No acute intracranial abnormality. Electronically Signed   By: Virgina Norfolk M.D.   On: 03/02/2020 23:12   CT Cervical Spine Wo Contrast  Result Date: 03/02/2020 CLINICAL DATA:  79 year old female with fall. EXAM: CT CERVICAL SPINE WITHOUT CONTRAST TECHNIQUE: Multidetector CT imaging of the cervical spine was performed without intravenous contrast. Multiplanar CT image reconstructions were also generated. COMPARISON:  Cervical spine radiograph dated 11/17/2015. FINDINGS: Evaluation of this exam is limited due to motion artifact. Alignment: No acute subluxation. There is straightening of normal cervical lordosis which may be positional or due to muscle spasm or secondary to degenerative changes. Skull base and vertebrae: No definite acute fracture. Evaluation however is very limited due to severe motion. If there is high clinical concern for acute fracture repeat CT is recommended. Soft tissues and spinal canal: No prevertebral fluid or swelling. No visible canal hematoma. Disc levels: Multilevel degenerative changes with endplate irregularity and disc space narrowing and spurring. Upper chest: Negative. Other: None IMPRESSION: Very limited study due to severe motion artifact. No definite acute/traumatic cervical spine pathology. If there is high clinical concern for acute fracture repeat CT is recommended. Electronically Signed   By: Anner Crete M.D.   On: 03/02/2020 23:56    Procedures Procedures (including critical care time)  Medications Ordered in ED Medications  ondansetron (ZOFRAN) injection 4 mg (4 mg Intravenous Given 03/03/20 0022)  fentaNYL (SUBLIMAZE) injection 50 mcg (50 mcg Intravenous Given 03/03/20 0022)  sodium chloride 0.9 % bolus 1,000 mL (1,000 mLs Intravenous New Bag/Given 03/03/20 0021)    ED Course  I have reviewed the  triage vital signs and the nursing notes.  Pertinent labs & imaging results that were available during my care of the patient were reviewed by me and considered in my medical decision making (see chart for details).    MDM Rules/Calculators/A&P                      78 year old female who presents for evaluation of mechanical fall that occurred earlier this evening.  Reports that her legs gave out, causing her to miss a step and states that she fell down approximately 8 steps.  She states she hit her head but does not think she had any LOC.  She reports difficulty getting up because she could not push off on her arm.  On exam, she has pain, limited range of motion noted to her right shoulder.  She does report she is on Aggrenox.  Plan for imaging of head, shoulder.  X-ray shows anterior dislocation of right shoulder with Hill-Sachs and Bankart lesions.  CT C-spine shows no acute traumatic injury.  CT head negative for any acute bony abnormality.  Patient signed out pending lab work, reduction.  Portions of this note were generated with [employer]. Dictation  errors may occur despite best attempts at proofreading.  Final Clinical Impression(s) / ED Diagnoses Final diagnoses:  Fall, initial encounter  Dislocation of right shoulder joint, initial encounter    Rx / DC Orders ED Discharge Orders    None       Desma Mcgregor 03/03/20 0104    Ripley Fraise, MD 03/03/20 [phone]

## 2020-03-03 ENCOUNTER — Emergency Department (HOSPITAL_COMMUNITY): Payer: Medicare HMO

## 2020-03-03 LAB — CBC WITH DIFFERENTIAL/PLATELET
Abs Immature Granulocytes: 0.08 10*3/uL — ABNORMAL HIGH (ref 0.00–0.07)
Basophils Absolute: 0 10*3/uL (ref 0.0–0.1)
Basophils Relative: 0 %
Eosinophils Absolute: 0 10*3/uL (ref 0.0–0.5)
Eosinophils Relative: 0 %
HCT: 38.7 % (ref 36.0–46.0)
Hemoglobin: 12.6 g/dL (ref 12.0–15.0)
Immature Granulocytes: 1 %
Lymphocytes Relative: 9 %
Lymphs Abs: 1.3 10*3/uL (ref 0.7–4.0)
MCH: 33.2 pg (ref 26.0–34.0)
MCHC: 32.6 g/dL (ref 30.0–36.0)
MCV: 101.8 fL — ABNORMAL HIGH (ref 80.0–100.0)
Monocytes Absolute: 0.3 10*3/uL (ref 0.1–1.0)
Monocytes Relative: 2 %
Neutro Abs: 13.9 10*3/uL — ABNORMAL HIGH (ref 1.7–7.7)
Neutrophils Relative %: 88 %
Platelets: 333 10*3/uL (ref 150–400)
RBC: 3.8 MIL/uL — ABNORMAL LOW (ref 3.87–5.11)
RDW: 12.6 % (ref 11.5–15.5)
WBC: 15.6 10*3/uL — ABNORMAL HIGH (ref 4.0–10.5)
nRBC: 0 % (ref 0.0–0.2)

## 2020-03-03 LAB — BASIC METABOLIC PANEL
Anion gap: 15 (ref 5–15)
BUN: 25 mg/dL — ABNORMAL HIGH (ref 8–23)
CO2: 20 mmol/L — ABNORMAL LOW (ref 22–32)
Calcium: 9.7 mg/dL (ref 8.9–10.3)
Chloride: 101 mmol/L (ref 98–111)
Creatinine, Ser: 1.09 mg/dL — ABNORMAL HIGH (ref 0.44–1.00)
GFR calc Af Amer: 57 mL/min — ABNORMAL LOW (ref >60)
GFR calc non Af Amer: 49 mL/min — ABNORMAL LOW (ref >60)
Glucose, Bld: 154 mg/dL — ABNORMAL HIGH (ref 70–99)
Potassium: 5.2 mmol/L — ABNORMAL HIGH (ref 3.5–5.1)
Sodium: 136 mmol/L (ref 135–145)

## 2020-03-03 MED ORDER — PROPOFOL 10 MG/ML IV BOLUS: 40.0000 mg | Freq: Once | INTRAVENOUS | Status: DC

## 2020-03-03 MED ORDER — HYDROCODONE-ACETAMINOPHEN 5-325 MG PO TABS
1.0000 | ORAL_TABLET | Freq: Once | ORAL | Status: AC
  Administered 2020-03-03: 02:00:00 1 via ORAL
  Filled 2020-03-03: qty 1

## 2020-03-03 MED ORDER — HYDROCODONE-ACETAMINOPHEN 5-325 MG PO TABS: 1.0000 | ORAL_TABLET | Freq: Four times a day (QID) | ORAL | 0 refills | Status: DC | PRN

## 2020-03-03 MED ORDER — PROPOFOL 10 MG/ML IV BOLUS
INTRAVENOUS | Status: AC | PRN
  Administered 2020-03-03 (×2): 20 mg via INTRAVENOUS

## 2020-03-03 MED ORDER — PROPOFOL 10 MG/ML IV BOLUS
40.0000 mg | Freq: Once | INTRAVENOUS | Status: AC
  Administered 2020-03-03: 02:00:00 40 mg via INTRAVENOUS
  Filled 2020-03-03: qty 20

## 2020-03-03 MED ORDER — SODIUM CHLORIDE 0.9 % IV BOLUS (SEPSIS): 1000.0000 mL | Freq: Once | INTRAVENOUS | Status: DC

## 2020-03-03 MED ORDER — SODIUM CHLORIDE 0.9 % IV BOLUS
1000.0000 mL | Freq: Once | INTRAVENOUS | Status: AC
  Administered 2020-03-03: 1000 mL via INTRAVENOUS

## 2020-03-03 NOTE — ED Notes (Signed)
Pt able to stand and ambulate to wheelchair with assistance by RN

## 2020-03-03 NOTE — Sedation Documentation (Signed)
Pt family is at bedside and will be taking them home.

## 2020-03-03 NOTE — ED Provider Notes (Signed)
Patient improved.  Successful reduction of right shoulder.  She can ambulate. She will call her orthopedist at [facility] today for follow-up.  Advised that shoulder is very unstable and that could dislocate again. Pain medicine has been ordered.  Home health has been ordered patient and daughter agreeable with plan   Ripley Fraise, MD 03/03/20 [phone]

## 2020-03-03 NOTE — Progress Notes (Signed)
Orthopedic Tech Progress Note Patient Details:  Pamela Cox 18-Aug-1941 [id number]  Ortho Devices Type of Ortho Device: Sling immobilizer Ortho Device/Splint Location: rue. applied post reduction with drs assistance. Ortho Device/Splint Interventions: Ordered, Application, Adjustment   Post Interventions Patient Tolerated: Well Instructions Provided: Care of device, Adjustment of device   Karolee Stamps 03/03/2020, 2:20 AM

## 2020-03-03 NOTE — ED Notes (Signed)
Patient verbalizes understanding of discharge instructions. Opportunity for questioning and answers were provided. Armband removed by staff, pt discharged from ED. Pt. ambulatory and discharged home.  

## 2020-03-03 NOTE — ED Provider Notes (Signed)
.  Sedation  Date/Time: 03/03/2020 1:10 AM Performed by: Ripley Fraise, MD Authorized by: Ripley Fraise, MD   Consent:    Consent obtained:  Written   Consent given by:  Patient   Risks discussed:  Inadequate sedation and respiratory compromise necessitating ventilatory assistance and intubation Universal protocol:    Immediately prior to procedure a time out was called: yes     Patient identity confirmation method:  Arm band, provided demographic data and verbally with patient Indications:    Procedure performed:  Dislocation reduction   Procedure necessitating sedation performed by:  Physician performing sedation Pre-sedation assessment:    Time since last food or drink:  4   ASA classification: class 2 - patient with mild systemic disease     Neck mobility: normal     Mouth opening:  3 or more finger widths   Mallampati score:  I - soft palate, uvula, fauces, pillars visible   Pre-sedation assessments completed and reviewed: airway patency, cardiovascular function, mental status and respiratory function     Pre-sedation assessment completed:  03/03/2020 1:10 AM Immediate pre-procedure details:    Reassessment: Patient reassessed immediately prior to procedure     Reviewed: vital signs, relevant labs/tests and NPO status     Verified: bag valve mask available   Procedure details (see MAR for exact dosages):    Preoxygenation:  Nasal cannula   Sedation:  Propofol   Intended level of sedation: deep   Intra-procedure monitoring:  Blood pressure monitoring, continuous capnometry, continuous pulse oximetry, frequent LOC assessments, frequent vital sign checks and cardiac monitor   Intra-procedure events: none     Total Provider sedation time (minutes):  20 Post-procedure details:    Post-sedation assessment completed:  03/03/2020 2:06 AM   Attendance: Constant attendance by certified staff until patient recovered     Recovery: Patient returned to pre-procedure baseline    Post-sedation assessments completed and reviewed: airway patency, mental status and nausea/vomiting     Patient is stable for discharge or admission: yes     Patient tolerance:  Tolerated well, no immediate complications Reduction of dislocation  Date/Time: 03/03/2020 1:11 AM Performed by: Ripley Fraise, MD Authorized by: Ripley Fraise, MD  Consent: Written consent obtained. Consent given by: patient Patient identity confirmed: verbally with patient, arm band and provided demographic data Time out: Immediately prior to procedure a "time out" was called to verify the correct patient, procedure, equipment, support staff and site/side marked as required.  Sedation: Patient sedated: yes  Patient tolerance: patient tolerated the procedure well with no immediate complications Comments: Reduction of right anterior shoulder dislocation successful with traction and countertraction.  Patient tolerated well.  Patient was neurovascular intact pre and post procedure.       Ripley Fraise, MD 03/03/20 [phone]

## 2020-05-22 ENCOUNTER — Other Ambulatory Visit: Payer: Self-pay | Admitting: Otolaryngology

## 2020-05-22 DIAGNOSIS — E041 Nontoxic single thyroid nodule: Secondary | ICD-10-CM

## 2020-05-28 ENCOUNTER — Other Ambulatory Visit: Payer: Self-pay

## 2020-05-28 ENCOUNTER — Ambulatory Visit
Admission: RE | Admit: 2020-05-28 | Discharge: 2020-05-28 | Disposition: A | Discharge status: Home / Self Care | Payer: Medicare HMO | Source: Ambulatory Visit | Attending: Otolaryngology | Admitting: Otolaryngology

## 2020-05-28 DIAGNOSIS — E041 Nontoxic single thyroid nodule: Secondary | ICD-10-CM | POA: Insufficient documentation

## 2020-06-04 ENCOUNTER — Ambulatory Visit: Payer: Medicare HMO | Attending: Sports Medicine

## 2020-06-04 ENCOUNTER — Other Ambulatory Visit: Payer: Self-pay

## 2020-06-04 DIAGNOSIS — M25511 Pain in right shoulder: Secondary | ICD-10-CM | POA: Insufficient documentation

## 2020-06-04 DIAGNOSIS — M25611 Stiffness of right shoulder, not elsewhere classified: Secondary | ICD-10-CM

## 2020-06-04 DIAGNOSIS — R262 Difficulty in walking, not elsewhere classified: Secondary | ICD-10-CM | POA: Diagnosis present

## 2020-06-04 DIAGNOSIS — M5442 Lumbago with sciatica, left side: Secondary | ICD-10-CM | POA: Diagnosis present

## 2020-06-04 DIAGNOSIS — M6281 Muscle weakness (generalized): Secondary | ICD-10-CM | POA: Insufficient documentation

## 2020-06-04 DIAGNOSIS — R293 Abnormal posture: Secondary | ICD-10-CM | POA: Insufficient documentation

## 2020-06-04 DIAGNOSIS — M5441 Lumbago with sciatica, right side: Secondary | ICD-10-CM | POA: Diagnosis present

## 2020-06-04 DIAGNOSIS — G8929 Other chronic pain: Secondary | ICD-10-CM | POA: Diagnosis present

## 2020-06-04 NOTE — Therapy (Signed)
[facility] [address], [place], [place], [address] Phone: [phone]   Fax:  [phone]  Physical Therapy Evaluation  Patient Details  Name: Pamela Cox MRN: [id number] Date of Birth: 1940-12-30 Referring Provider (PT): Blane Ohara, MD   Encounter Date: 06/04/2020   PT End of Session - 06/04/20 1057    Visit Number 1    Date for PT Re-Evaluation 07/30/20    Authorization Type Medicare    Progress Note Due on Visit 10    PT Start Time 1018    PT Stop Time 1106    PT Time Calculation (min) 48 min    Activity Tolerance Patient tolerated treatment well    Behavior During Therapy [facility] for tasks assessed/performed           Past Medical History:  Diagnosis Date  . Arthritis   . CML (chronic myelocytic leukemia) ([place])   . CVA (cerebral vascular accident) ([place])   . Diabetes mellitus without complication ([place])   . GERD (gastroesophageal reflux disease)   . Hypertension   . Myelocytic leukemia, chronic ([place])   . Obesity   . Sleep apnea     Past Surgical History:  Procedure Laterality Date  . APPENDECTOMY    . COLONOSCOPY    . COLONOSCOPY WITH PROPOFOL N/A 07/16/2019   Procedure: COLONOSCOPY WITH PROPOFOL;  Surgeon: Lollie Sails, MD;  Location: [facility] ENDOSCOPY;  Service: Endoscopy;  Laterality: N/A;  . ESOPHAGOGASTRODUODENOSCOPY (EGD) WITH PROPOFOL N/A 07/16/2019   Procedure: ESOPHAGOGASTRODUODENOSCOPY (EGD) WITH PROPOFOL;  Surgeon: Lollie Sails, MD;  Location: [facility] ENDOSCOPY;  Service: Endoscopy;  Laterality: N/A;  . TUBAL LIGATION      There were no vitals filed for this visit.    Subjective Assessment - 06/04/20 1027    Subjective Pt presents to PT with Rt shoulder pain that began when she fell on the steps when walking up to her bedroom.  Pt dislocated her shoulder during this fall.  Pt has a complex medical history- listed below.    Pertinent History leukemia, 2 CVA (most recent in 2010),  HTN, sleep apnea, DM    Diagnostic tests complete tear of Rt RTC and Rt shoulder    Patient Stated Goals use Rt UE for self-care and functional task    Currently in Pain? Yes    Pain Score 6     Pain Location Shoulder    Pain Orientation Right    Pain Descriptors / Indicators Aching;Sore;Nagging    Pain Type Chronic pain    Pain Onset More than a month ago    Pain Frequency Constant    Aggravating Factors  washing dishes, cooking, self-care tasks    Pain Relieving Factors medication, rest              [facility] PT Assessment - 06/04/20 0001      Assessment   Medical Diagnosis chronic Rt shoulder pain    Referring Provider (PT) Blane Ohara, MD    Onset Date/Surgical Date 03/02/20    Hand Dominance Right    Prior Therapy none for shoulder       Precautions   Precautions Fall      Restrictions   Weight Bearing Restrictions No      Balance Screen   Has the patient fallen in the past 6 months Yes    How many times? 1   chronic falls risk   Has the patient had a decrease in activity level because of  a fear of falling?  Yes    Is the patient reluctant to leave their home because of a fear of falling?  Yes      Home Environment   Living Environment Private residence    Living Arrangements Other relatives;Other (Comment)    Type of Home House    Home Access Stairs to enter    Home Layout Two level      Prior Function   Level of Independence Independent with community mobility with device;Needs assistance with ADLs;Needs assistance with homemaking    Vocation Retired    Comments difficulty with daily tasks due to limited use of Rt UE (45% of normal use)      Cognition   Overall Cognitive Status Within Functional Limits for tasks assessed      Observation/Other Assessments   Focus on Therapeutic Outcomes (FOTO)  55% limitation      Posture/Postural Control   Posture/Postural Control Postural limitations    Postural Limitations Forward head;Rounded Shoulders;Flexed trunk        ROM / Strength   AROM / PROM / Strength AROM;PROM;Strength      AROM   Overall AROM  Deficits;Due to pain    Overall AROM Comments Rt shoulder flexion 65, abduction 90- both with pain and scapular elevation      PROM   Overall PROM  Due to pain;Unable to assess    Overall PROM Comments too much pain to test      Strength   Overall Strength Deficits    Strength Assessment Site Shoulder    Right/Left Shoulder Right    Right Shoulder Flexion 2+/5    Right Shoulder Extension 3+/5    Right Shoulder ABduction 2+/5    Right Shoulder Internal Rotation 4/5    Right Shoulder External Rotation 2+/5      Palpation   Palpation comment pain and trigger points over Rt upper traps, anterior deltoid and deltoid tuberosity                      Objective measurements completed on examination: See above findings.       [facility] Adult PT Treatment/Exercise - 06/04/20 0001      Modalities   Modalities Electrical Stimulation;Moist Heat      Moist Heat Therapy   Number Minutes Moist Heat 12 Minutes    Moist Heat Location Shoulder      Electrical Stimulation   Electrical Stimulation Location Rt shoulder     Electrical Stimulation Action IFC    Electrical Stimulation Parameters 12 minutes    Electrical Stimulation Goals Pain                  PT Education - 06/04/20 1129    Education Details home TENS info-[employer]    Person(s) Educated Patient    Methods Explanation;Handout    Comprehension Verbalized understanding            PT Short Term Goals - 06/04/20 1059      PT SHORT TERM GOAL #1   Title Pt will be independent in initial HEP.    Time 4    Period Weeks    Status New    Target Date 07/30/20      PT SHORT TERM GOAL #2   Title Pt will report at least 20% improvement inRt shoulder pain with use.    Time 4    Period Weeks    Target Date 07/02/20      PT  SHORT TERM GOAL #3   Title demonstrate Rt shoulder A/ROM flexion to > or = to 75 degrees to  improve functional use    Baseline --    Time 4    Period Weeks    Status New    Target Date 07/02/20             PT Long Term Goals - 06/04/20 1114      PT LONG TERM GOAL #1   Title Pt will be independent in advanced HEP.    Time 8    Period Weeks    Status New    Target Date 07/30/20      PT LONG TERM GOAL #2   Title Pt will report at least 50% improvement in Rt shoulder pain with use.    Time 8    Period Weeks    Status New    Target Date 07/30/20      PT LONG TERM GOAL #3   Title reduce FOTO to < or = to 42% limitation    Time 8    Period Weeks    Status New    Target Date 07/30/20      PT LONG TERM GOAL #4   Title demonstrate 3 to 3+/5 Rt shoulder strength thoughout to improve endurance with use    Time 8    Period Weeks    Status New    Target Date 07/30/20      PT LONG TERM GOAL #5   Title improve Rt shoulder strength and A/ROM to improve use of Rt UE to 60% with daily tasks    Time 8    Period Weeks    Status New    Target Date 07/30/20                  Plan - 06/04/20 1125    Clinical Impression Statement Pt is a Rt hand dominant female who presents to PT with Rt shoulder pain and dysfunction due to dislocation and insufficient rotator cuff on the Rt.  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 Pt reports 35% use of Rt UE with ADLs and self-care and has difficulty with using arm overhead and with sit to stand.  Pt demonstrates significant Rt UE a/ROM and strength deficits, 6/10 pain with use and palpable tenderness in Rt upper trap, and anterior/lateral deltoid.  Pt has a complex medical history that will impact recovery from this injury/condition.  Pt will benefit from skilled PT to address Rt shoulder strength, functional use, A/ROM and pain.    Personal Factors and Comorbidities Age;Comorbidity 3+    Comorbidities 2 CVA, leukemia, HTN, DJD, complete tear of  Rt rotator cuff    Examination-Activity Limitations Dressing;Bathing;Hygiene/Grooming;Lift;Carry;Toileting;Transfers    Examination-Participation Restrictions Community Activity;Laundry;Meal Prep    Stability/Clinical Decision Making Evolving/Moderate complexity    Clinical Decision Making Moderate    Rehab Potential Good    PT Frequency 2x / week    PT Duration 8 weeks    PT Treatment/Interventions ADLs/Self Care Home Management;Cryotherapy;Electrical Stimulation;Moist Heat;Neuromuscular re-education;Balance training;Therapeutic exercise;Therapeutic activities;Patient/family education;Manual techniques;Dry needling;Passive range of motion;Taping    PT Next Visit Plan pt will bring HEP from home health for review.  See how e-stim helped and if she got a home unit, Rt shoulder ROM and strength    Consulted and Agree with Plan of Care Patient  Patient will benefit from skilled therapeutic intervention in order to improve the following deficits and impairments:  Decreased activity tolerance, Decreased strength, Postural dysfunction, Impaired flexibility, Pain, Increased muscle spasms, Decreased endurance, Decreased range of motion, Impaired UE functional use  Visit Diagnosis: Chronic right shoulder pain - Plan: PT plan of care cert/re-cert  Muscle weakness (generalized) - Plan: PT plan of care cert/re-cert  Abnormal posture - Plan: PT plan of care cert/re-cert  Stiffness of right shoulder, not elsewhere classified - Plan: PT plan of care cert/re-cert     Problem List Patient Active Problem List   Diagnosis Date Noted  . Allergic rhinitis due to pollen 08/23/2018  . DDD (degenerative disc disease), lumbar 08/23/2018  . Lumbar neuritis 08/23/2018  . 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  . GERD (gastroesophageal reflux disease) 08/23/2018  . Morbid obesity ([place]) 08/23/2018  . Obstructive sleep apnea 08/23/2018  . Intervertebral disc disorder with radiculopathy of lumbar region  08/23/2018  . CML (chronic myelocytic leukemia) ([place]) 08/23/2018  . Hx of myocardial perfusion scan 08/23/2018  . Osteoarthritis 08/23/2018  . Degenerative arthritis of hip 08/23/2018  . Lumbar spondylosis 08/23/2018  . SOB (shortness of breath) 08/23/2018  . History of CVA (cerebrovascular accident) 08/23/2018  . Essential hypertension 08/23/2018  . Chronic right shoulder pain 08/23/2018  . Complete tear of right rotator cuff 08/23/2018  . Acute pain of left shoulder 08/23/2018  . On antineoplastic chemotherapy 08/23/2018  . Hyperlipidemia 08/23/2018  . Neurogenic claudication due to lumbar spinal stenosis 08/23/2018  . Vitamin D deficiency 08/23/2018    Sigurd Sos, PT 06/04/20 11:34 AM  [facility] [address], [place], [place], [address] Phone: [phone]   Fax:  [phone]  Name: Latashia Koch MRN: [id number] Date of Birth: [birth year]

## 2020-06-09 ENCOUNTER — Ambulatory Visit: Payer: Medicare HMO | Admitting: Physical Therapy

## 2020-06-09 ENCOUNTER — Encounter: Payer: Self-pay | Admitting: Physical Therapy

## 2020-06-09 ENCOUNTER — Other Ambulatory Visit: Payer: Self-pay

## 2020-06-09 DIAGNOSIS — M25611 Stiffness of right shoulder, not elsewhere classified: Secondary | ICD-10-CM

## 2020-06-09 DIAGNOSIS — M25511 Pain in right shoulder: Secondary | ICD-10-CM | POA: Diagnosis not present

## 2020-06-09 DIAGNOSIS — R262 Difficulty in walking, not elsewhere classified: Secondary | ICD-10-CM

## 2020-06-09 DIAGNOSIS — M6281 Muscle weakness (generalized): Secondary | ICD-10-CM

## 2020-06-09 DIAGNOSIS — R293 Abnormal posture: Secondary | ICD-10-CM

## 2020-06-09 DIAGNOSIS — G8929 Other chronic pain: Secondary | ICD-10-CM

## 2020-06-09 NOTE — Therapy (Signed)
[facility] [address] [place], [place], [address] Phone: [phone]   Fax:  [phone]  Physical Therapy Treatment  Patient Details  Name: Pamela Cox MRN: [id number] Date of Birth: October 15, 1941 Referring Provider (PT): Blane Ohara, MD   Encounter Date: 06/09/2020   PT End of Session - 06/09/20 1228    Visit Number 2    Date for PT Re-Evaluation 07/30/20    Authorization Type Medicare    Progress Note Due on Visit 10    PT Start Time 1227    PT Stop Time 1315    PT Time Calculation (min) 48 min    Activity Tolerance Patient tolerated treatment well    Behavior During Therapy [facility] for tasks assessed/performed           Past Medical History:  Diagnosis Date  . Arthritis   . CML (chronic myelocytic leukemia) ([place])   . CVA (cerebral vascular accident) ([place])   . Diabetes mellitus without complication ([place])   . GERD (gastroesophageal reflux disease)   . Hypertension   . Myelocytic leukemia, chronic ([place])   . Obesity   . Sleep apnea     Past Surgical History:  Procedure Laterality Date  . APPENDECTOMY    . COLONOSCOPY    . COLONOSCOPY WITH PROPOFOL N/A 07/16/2019   Procedure: COLONOSCOPY WITH PROPOFOL;  Surgeon: Lollie Sails, MD;  Location: [facility] ENDOSCOPY;  Service: Endoscopy;  Laterality: N/A;  . ESOPHAGOGASTRODUODENOSCOPY (EGD) WITH PROPOFOL N/A 07/16/2019   Procedure: ESOPHAGOGASTRODUODENOSCOPY (EGD) WITH PROPOFOL;  Surgeon: Lollie Sails, MD;  Location: [facility] ENDOSCOPY;  Service: Endoscopy;  Laterality: N/A;  . TUBAL LIGATION      There were no vitals filed for this visit.   Subjective Assessment - 06/09/20 1229    Subjective Medium pain today. The Estim helped reduce the pain for 1-2 days. I have not gotten a TENS unit yet but I plan on doing it.    Pertinent History leukemia, 2 CVA (most recent in 2010), HTN, sleep apnea, DM    Currently in Pain? Yes    Pain Score 6     Pain Location  Shoulder    Pain Orientation Right    Pain Descriptors / Indicators Sore    Multiple Pain Sites No                             [facility] Adult PT Treatment/Exercise - 06/09/20 0001      Shoulder Exercises: Seated   Other Seated Exercises ranger flexion 10x      Electrical Stimulation   Electrical Stimulation Location Rt shoulder     Electrical Stimulation Action IFC    Electrical Stimulation Parameters 10 min    Electrical Stimulation Goals Pain      Manual Therapy   Manual Therapy Soft tissue mobilization    Manual therapy comments Manually resisted, light,    Al lplanes 10x 5 sec hold, VC to not push to pain.   Soft tissue mobilization Addaday asst to RT upper trap, interscapula, and lats in sitting                    PT Short Term Goals - 06/04/20 1059      PT SHORT TERM GOAL #1   Title Pt will be independent in initial HEP.    Time 4    Period Weeks    Status New  Target Date 07/30/20      PT SHORT TERM GOAL #2   Title Pt will report at least 20% improvement inRt shoulder pain with use.    Time 4    Period Weeks    Target Date 07/02/20      PT SHORT TERM GOAL #3   Title demonstrate Rt shoulder A/ROM flexion to > or = to 75 degrees to improve functional use    Baseline --    Time 4    Period Weeks    Status New    Target Date 07/02/20             PT Long Term Goals - 06/04/20 1114      PT LONG TERM GOAL #1   Title Pt will be independent in advanced HEP.    Time 8    Period Weeks    Status New    Target Date 07/30/20      PT LONG TERM GOAL #2   Title Pt will report at least 50% improvement in Rt shoulder pain with use.    Time 8    Period Weeks    Status New    Target Date 07/30/20      PT LONG TERM GOAL #3   Title reduce FOTO to < or = to 42% limitation    Time 8    Period Weeks    Status New    Target Date 07/30/20      PT LONG TERM GOAL #4   Title demonstrate 3 to 3+/5 Rt shoulder strength thoughout to improve  endurance with use    Time 8    Period Weeks    Status New    Target Date 07/30/20      PT LONG TERM GOAL #5   Title improve Rt shoulder strength and A/ROM to improve use of Rt UE to 60% with daily tasks    Time 8    Period Weeks    Status New    Target Date 07/30/20                 Plan - 06/09/20 1228    Clinical Impression Statement Pt arrives with "medium pain." Pt did not bring prior HEP from home health. PTA provided a combination of isometrics/light manual resistance to RT shoulder in all planes and gave pt isometrics for HEP. Pt does have tendency to make the exercise more resisted throughout her motion but this did not seem to increaseher pain when she did this. Pt reports 1-2 days of pain relief with ESTIM. She has plans to purchase one. Pain less at end of session.    Personal Factors and Comorbidities Age;Comorbidity 3+    Comorbidities 2 CVA, leukemia, HTN, DJD, complete tear of Rt rotator cuff    Examination-Activity Limitations Dressing;Bathing;Hygiene/Grooming;Lift;Carry;Toileting;Transfers    Examination-Participation Restrictions Community Activity;Laundry;Meal Prep    Stability/Clinical Decision Making Evolving/Moderate complexity    Rehab Potential Good    PT Frequency 2x / week    PT Duration 8 weeks    PT Treatment/Interventions ADLs/Self Care Home Management;Cryotherapy;Electrical Stimulation;Moist Heat;Neuromuscular re-education;Balance training;Therapeutic exercise;Therapeutic activities;Patient/family education;Manual techniques;Dry needling;Passive range of motion;Taping    PT Next Visit Plan Review isometrics, continue with tolerable shoulder ROM and strength    Consulted and Agree with Plan of Care Patient           Patient will benefit from skilled therapeutic intervention in order to improve the following deficits and impairments:  Decreased activity tolerance, Decreased  strength, Postural dysfunction, Impaired flexibility, Pain, Increased muscle  spasms, Decreased endurance, Decreased range of motion, Impaired UE functional use  Visit Diagnosis: Chronic right shoulder pain  Muscle weakness (generalized)  Abnormal posture  Stiffness of right shoulder, not elsewhere classified  Chronic bilateral low back pain with bilateral sciatica  Difficulty in walking, not elsewhere classified     Problem List Patient Active Problem List   Diagnosis Date Noted  . Allergic rhinitis due to pollen 08/23/2018  . DDD (degenerative disc disease), lumbar 08/23/2018  . Lumbar neuritis 08/23/2018  . 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  . GERD (gastroesophageal reflux disease) 08/23/2018  . Morbid obesity ([place]) 08/23/2018  . Obstructive sleep apnea 08/23/2018  . Intervertebral disc disorder with radiculopathy of lumbar region 08/23/2018  . CML (chronic myelocytic leukemia) ([place]) 08/23/2018  . Hx of myocardial perfusion scan 08/23/2018  . Osteoarthritis 08/23/2018  . Degenerative arthritis of hip 08/23/2018  . Lumbar spondylosis 08/23/2018  . SOB (shortness of breath) 08/23/2018  . History of CVA (cerebrovascular accident) 08/23/2018  . Essential hypertension 08/23/2018  . Chronic right shoulder pain 08/23/2018  . Complete tear of right rotator cuff 08/23/2018  . Acute pain of left shoulder 08/23/2018  . On antineoplastic chemotherapy 08/23/2018  . Hyperlipidemia 08/23/2018  . Neurogenic claudication due to lumbar spinal stenosis 08/23/2018  . Vitamin D deficiency 08/23/2018    Lesieli Bresee, PTA 06/09/2020, 1:10 PM  [facility] [address], [place], [place], [address] Phone: [phone]   Fax:  [phone]  Name: Sharni Negron MRN: [id number] Date of Birth: [birth year]

## 2020-06-11 ENCOUNTER — Other Ambulatory Visit: Payer: Self-pay

## 2020-06-11 ENCOUNTER — Ambulatory Visit: Payer: Medicare HMO

## 2020-06-11 DIAGNOSIS — M25511 Pain in right shoulder: Secondary | ICD-10-CM | POA: Diagnosis not present

## 2020-06-11 DIAGNOSIS — M25611 Stiffness of right shoulder, not elsewhere classified: Secondary | ICD-10-CM

## 2020-06-11 DIAGNOSIS — R293 Abnormal posture: Secondary | ICD-10-CM

## 2020-06-11 DIAGNOSIS — M6281 Muscle weakness (generalized): Secondary | ICD-10-CM

## 2020-06-11 NOTE — Therapy (Signed)
[facility] [address], [place], [place], [address] Phone: [phone]   Fax:  [phone]  Physical Therapy Treatment  Patient Details  Name: Pamela Cox MRN: [id number] Date of Birth: [birth year] Referring Provider (PT): Blane Ohara, MD   Encounter Date: 06/11/2020   PT End of Session - 06/11/20 1149    Visit Number 3    Date for PT Re-Evaluation 07/30/20    Authorization Type Medicare    Progress Note Due on Visit 10    PT Start Time 1109    PT Stop Time 1159    PT Time Calculation (min) 50 min    Activity Tolerance Patient tolerated treatment well    Behavior During Therapy [facility] for tasks assessed/performed           Past Medical History:  Diagnosis Date  . Arthritis   . CML (chronic myelocytic leukemia) ([place])   . CVA (cerebral vascular accident) ([place])   . Diabetes mellitus without complication ([place])   . GERD (gastroesophageal reflux disease)   . Hypertension   . Myelocytic leukemia, chronic ([place])   . Obesity   . Sleep apnea     Past Surgical History:  Procedure Laterality Date  . APPENDECTOMY    . COLONOSCOPY    . COLONOSCOPY WITH PROPOFOL N/A 07/16/2019   Procedure: COLONOSCOPY WITH PROPOFOL;  Surgeon: Lollie Sails, MD;  Location[facility] ENDOSCOPY;  Service: Endoscopy;  Laterality: N/A;  . ESOPHAGOGASTRODUODENOSCOPY (EGD) WITH PROPOFOL N/A 07/16/2019   Procedure: ESOPHAGOGASTRODUODENOSCOPY (EGD) WITH PROPOFOL;  Surgeon: Lollie Sails, MD;  Location: [facility] ENDOSCOPY;  Service: Endoscopy;  Laterality: N/A;  . TUBAL LIGATION      There were no vitals filed for this visit.   Subjective Assessment - 06/11/20 1114    Subjective My TENs unit should be coming today.  The e-stim helped last time.    Currently in Pain? Yes    Pain Score 6     Pain Location Shoulder    Pain Orientation Right    Pain Descriptors / Indicators Sore    Pain Type Chronic pain    Pain Onset More than a month ago     Pain Frequency Constant    Aggravating Factors  washing dishes, cooking, self-care tasks    Pain Relieving Factors medication, rest                             [facility] Adult PT Treatment/Exercise - 06/11/20 0001      Shoulder Exercises: Seated   Diagonals Weight (lbs) Biceps curls: Rt 2# 2x10    Diagonals Limitations UT stretch on the Rt 3x20 seconds    Other Seated Exercises ball rolls: green ball 2x10    Other Seated Exercises ranger flexion 15x, circles bil x 10 each      Shoulder Exercises: ROM/Strengthening   Nustep Level 1 x 8 minutes   focus on use of UEs- PT monitored for fatigue     Modalities   Modalities Cryotherapy      Cryotherapy   Number Minutes Cryotherapy 15 Minutes    Cryotherapy Location Shoulder    Type of Cryotherapy Ice pack      Electrical Stimulation   Electrical Stimulation Location Rt shoulder     Electrical Stimulation Action IFC    Electrical Stimulation Parameters 15 minutes    Electrical Stimulation Goals Pain  PT Short Term Goals - 06/04/20 1059      PT SHORT TERM GOAL #1   Title Pt will be independent in initial HEP.    Time 4    Period Weeks    Status New    Target Date 07/30/20      PT SHORT TERM GOAL #2   Title Pt will report at least 20% improvement inRt shoulder pain with use.    Time 4    Period Weeks    Target Date 07/02/20      PT SHORT TERM GOAL #3   Title demonstrate Rt shoulder A/ROM flexion to > or = to 75 degrees to improve functional use    Baseline --    Time 4    Period Weeks    Status New    Target Date 07/02/20             PT Long Term Goals - 06/04/20 1114      PT LONG TERM GOAL #1   Title Pt will be independent in advanced HEP.    Time 8    Period Weeks    Status New    Target Date 07/30/20      PT LONG TERM GOAL #2   Title Pt will report at least 50% improvement in Rt shoulder pain with use.    Time 8    Period Weeks    Status New    Target  Date 07/30/20      PT LONG TERM GOAL #3   Title reduce FOTO to < or = to 42% limitation    Time 8    Period Weeks    Status New    Target Date 07/30/20      PT LONG TERM GOAL #4   Title demonstrate 3 to 3+/5 Rt shoulder strength thoughout to improve endurance with use    Time 8    Period Weeks    Status New    Target Date 07/30/20      PT LONG TERM GOAL #5   Title improve Rt shoulder strength and A/ROM to improve use of Rt UE to 60% with daily tasks    Time 8    Period Weeks    Status New    Target Date 07/30/20                 Plan - 06/11/20 1130    Clinical Impression Statement Pt arrives with 6/10 Rt shoulder pain. Pt did not bring prior HEP from home health. Session focused on gentle Rt UE mobility and movement without aggravation of symptoms.  Pt required tactile and demo cues to reduce substitution with stretching and to allow for appropriate stretch. Pt reports 1-2 days of pain relief with ESTIM after last session and her home unit should arrive today. Pt will continue to benefit from skilled PT to address Rt shoulder pain, limited strength and flexibility after injury to improve functional use.    PT Frequency 2x / week    PT Duration 8 weeks    PT Treatment/Interventions ADLs/Self Care Home Management;Cryotherapy;Electrical Stimulation;Moist Heat;Neuromuscular re-education;Balance training;Therapeutic exercise;Therapeutic activities;Patient/family education;Manual techniques;Dry needling;Passive range of motion;Taping    PT Next Visit Plan continue with tolerable shoulder ROM and strength    PT Home Exercise Plan shoulder isometrics    Consulted and Agree with Plan of Care Patient           Patient will benefit from skilled therapeutic intervention in order to improve the  following deficits and impairments:  Decreased activity tolerance, Decreased strength, Postural dysfunction, Impaired flexibility, Pain, Increased muscle spasms, Decreased endurance, Decreased  range of motion, Impaired UE functional use  Visit Diagnosis: Chronic right shoulder pain  Muscle weakness (generalized)  Abnormal posture  Stiffness of right shoulder, not elsewhere classified     Problem List Patient Active Problem List   Diagnosis Date Noted  . Allergic rhinitis due to pollen 08/23/2018  . DDD (degenerative disc disease), lumbar 08/23/2018  . Lumbar neuritis 08/23/2018  . 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  . GERD (gastroesophageal reflux disease) 08/23/2018  . Morbid obesity ([place]) 08/23/2018  . Obstructive sleep apnea 08/23/2018  . Intervertebral disc disorder with radiculopathy of lumbar region 08/23/2018  . CML (chronic myelocytic leukemia) ([place]) 08/23/2018  . Hx of myocardial perfusion scan 08/23/2018  . Osteoarthritis 08/23/2018  . Degenerative arthritis of hip 08/23/2018  . Lumbar spondylosis 08/23/2018  . SOB (shortness of breath) 08/23/2018  . History of CVA (cerebrovascular accident) 08/23/2018  . Essential hypertension 08/23/2018  . Chronic right shoulder pain 08/23/2018  . Complete tear of right rotator cuff 08/23/2018  . Acute pain of left shoulder 08/23/2018  . On antineoplastic chemotherapy 08/23/2018  . Hyperlipidemia 08/23/2018  . Neurogenic claudication due to lumbar spinal stenosis 08/23/2018  . Vitamin D deficiency 08/23/2018     Sigurd Sos, PT 06/11/20 11:50 AM  [facility] [address], [place], [place], [address] Phone: [phone]   Fax:  [phone]  Name: Pamela Cox MRN: [id number] Date of Birth: 03/19/1941

## 2020-06-16 ENCOUNTER — Ambulatory Visit: Payer: Medicare HMO | Admitting: Physical Therapy

## 2020-06-16 ENCOUNTER — Other Ambulatory Visit: Payer: Self-pay

## 2020-06-16 ENCOUNTER — Encounter: Payer: Self-pay | Admitting: Physical Therapy

## 2020-06-16 DIAGNOSIS — M25511 Pain in right shoulder: Secondary | ICD-10-CM | POA: Diagnosis not present

## 2020-06-16 DIAGNOSIS — G8929 Other chronic pain: Secondary | ICD-10-CM

## 2020-06-16 DIAGNOSIS — M6281 Muscle weakness (generalized): Secondary | ICD-10-CM

## 2020-06-16 DIAGNOSIS — M25611 Stiffness of right shoulder, not elsewhere classified: Secondary | ICD-10-CM

## 2020-06-16 DIAGNOSIS — R293 Abnormal posture: Secondary | ICD-10-CM

## 2020-06-16 NOTE — Therapy (Addendum)
[facility] [address] [place], [place], [address] Phone: [phone]   Fax:  [phone]  Physical Therapy Treatment  Patient Details  Name: Pamela Cox MRN: [id number] Date of Birth: 1941-01-08 Referring Provider (PT): Blane Ohara, MD   Encounter Date: 06/16/2020   PT End of Session - 06/16/20 1244    Visit Number 4    Date for PT Re-Evaluation 07/30/20    Authorization Type Medicare    Progress Note Due on Visit 10    PT Start Time 1243    PT Stop Time 1315    PT Time Calculation (min) 32 min    Activity Tolerance Patient tolerated treatment well    Behavior During Therapy [facility] for tasks assessed/performed           Past Medical History:  Diagnosis Date  . Arthritis   . CML (chronic myelocytic leukemia) ([place])   . CVA (cerebral vascular accident) ([place])   . Diabetes mellitus without complication ([place])   . GERD (gastroesophageal reflux disease)   . Hypertension   . Myelocytic leukemia, chronic ([place])   . Obesity   . Sleep apnea     Past Surgical History:  Procedure Laterality Date  . APPENDECTOMY    . COLONOSCOPY    . COLONOSCOPY WITH PROPOFOL N/A 07/16/2019   Procedure: COLONOSCOPY WITH PROPOFOL;  Surgeon: Lollie Sails, MD;  Location: [facility] ENDOSCOPY;  Service: Endoscopy;  Laterality: N/A;  . ESOPHAGOGASTRODUODENOSCOPY (EGD) WITH PROPOFOL N/A 07/16/2019   Procedure: ESOPHAGOGASTRODUODENOSCOPY (EGD) WITH PROPOFOL;  Surgeon: Lollie Sails, MD;  Location: [facility] ENDOSCOPY;  Service: Endoscopy;  Laterality: N/A;  . TUBAL LIGATION      There were no vitals filed for this visit.   Subjective Assessment - 06/16/20 1246    Subjective [facility] T[facility] unit[facility]. [facility] is scheduled for RTC surgery in Oct. MD says muscle 'is pretty bad."    Pertinent History leukemia, 2 CVA (most recent in 2010), HTN, sleep apnea, DM    Currently in Pain? Yes    Pain Score 7     Pain Location Shoulder     Pain Orientation Right    Pain Descriptors / Indicators Sore;Sharp    Aggravating Factors  Use    Pain Relieving Factors TENS    Multiple Pain Sites No              [facility] PT Assessment - 06/16/20 0001      Assessment   Medical Diagnosis chronic Rt shoulder pain    Referring Provider (PT) Blane Ohara, MD    Onset Date/Surgical Date 03/02/20    Hand Dominance Right      Observation/Other Assessments   Focus on Therapeutic Outcomes (FOTO)  55% limitation      AROM   Overall AROM Comments Rt shoulder flexion 65, abduction 90- both with pain and scapular elevation      Strength   Overall Strength Deficits    Strength Assessment Site Shoulder    Right/Left Shoulder Right    Right Shoulder Flexion 2+/5    Right Shoulder Extension 3+/5    Right Shoulder ABduction 2+/5    Right Shoulder Internal Rotation 4/5    Right Shoulder External Rotation 2+/5                         [facility] Adult PT Treatment/Exercise - 06/16/20 0001      Self-Care   Self-Care  Other Self-Care Comments    Other Self-Care Comments  TENS unit home instruction use,       Electrical Stimulation   Electrical Stimulation Location Rt shoulder     Electrical Stimulation Action normal mode    Electrical Stimulation Goals Pain   Pt independent in changing basic parameters                   PT Short Term Goals - 06/16/20 1315      PT SHORT TERM GOAL #1   Title Pt will be independent in initial HEP.    Time 4    Period Weeks    Status Achieved    Target Date 07/30/20      PT SHORT TERM GOAL #2   Title Pt will report at least 20% improvement inRt shoulder pain with use.    Time 4    Period Weeks    Status Achieved    Target Date 07/02/20      PT SHORT TERM GOAL #3   Title demonstrate Rt shoulder A/ROM flexion to > or = to 75 degrees to improve functional use    Time 4    Period Weeks    Status Not Met    Target Date 07/02/20             PT Long Term Goals - 06/16/20  1316      PT LONG TERM GOAL #1   Title Pt will be independent in advanced HEP.    Time 8    Period Weeks    Status Not Met   HEP not advanced due to having suregry in Oct     PT LONG TERM GOAL #2   Title Pt will report at least 50% improvement in Rt shoulder pain with use.    Time 8    Period Weeks    Status Not Met   20%     PT LONG TERM GOAL #3   Title reduce FOTO to < or = to 42% limitation    Time 8    Period Weeks    Status Not Met   55% limited     PT LONG TERM GOAL #4   Title demonstrate 3 to 3+/5 Rt shoulder strength thoughout to improve endurance with use    Time 8    Period Weeks    Status Not Met      PT LONG TERM GOAL #5   Title improve Rt shoulder strength and A/ROM to improve use of Rt UE to 60% with daily tasks    Time 8    Period Weeks    Status Not Met                 Plan - 06/16/20 1244    Clinical Impression Statement Pt saw MD at [facility] this AM. Pt will have shoulder surgery on October, MD stated further PT will be of no benefit at this time.    Personal Factors and Comorbidities Age;Comorbidity 3+    Comorbidities 2 CVA, leukemia, HTN, DJD, complete tear of Rt rotator cuff    Examination-Activity Limitations Dressing;Bathing;Hygiene/Grooming;Lift;Carry;Toileting;Transfers    Examination-Participation Restrictions Community Activity;Laundry;Meal Prep    Stability/Clinical Decision Making Evolving/Moderate complexity    Rehab Potential Good    PT Frequency 2x / week    PT Duration 8 weeks    PT Treatment/Interventions ADLs/Self Care Home Management;Cryotherapy;Electrical Stimulation;Moist Heat;Neuromuscular re-education;Balance training;Therapeutic exercise;Therapeutic activities;Patient/family education;Manual techniques;Dry needling;Passive range of motion;Taping    PT  Next Visit Plan Discharge, pt will have surgery in Oct. Pt will use TENS unti to help with pain pre and post surgery.    Consulted and Agree with Plan of Care Patient             Patient will benefit from skilled therapeutic intervention in order to improve the following deficits and impairments:  Decreased activity tolerance, Decreased strength, Postural dysfunction, Impaired flexibility, Pain, Increased muscle spasms, Decreased endurance, Decreased range of motion, Impaired UE functional use  Visit Diagnosis: Chronic right shoulder pain  Muscle weakness (generalized)  Abnormal posture  Stiffness of right shoulder, not elsewhere classified     Problem List Patient Active Problem List   Diagnosis Date Noted  . Allergic rhinitis due to pollen 08/23/2018  . DDD (degenerative disc disease), lumbar 08/23/2018  . Lumbar neuritis 08/23/2018  . 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  . GERD (gastroesophageal reflux disease) 08/23/2018  . Morbid obesity ([place]) 08/23/2018  . Obstructive sleep apnea 08/23/2018  . Intervertebral disc disorder with radiculopathy of lumbar region 08/23/2018  . CML (chronic myelocytic leukemia) ([place]) 08/23/2018  . Hx of myocardial perfusion scan 08/23/2018  . Osteoarthritis 08/23/2018  . Degenerative arthritis of hip 08/23/2018  . Lumbar spondylosis 08/23/2018  . SOB (shortness of breath) 08/23/2018  . History of CVA (cerebrovascular accident) 08/23/2018  . Essential hypertension 08/23/2018  . Chronic right shoulder pain 08/23/2018  . Complete tear of right rotator cuff 08/23/2018  . Acute pain of left shoulder 08/23/2018  . On antineoplastic chemotherapy 08/23/2018  . Hyperlipidemia 08/23/2018  . Neurogenic claudication due to lumbar spinal stenosis 08/23/2018  . Vitamin D deficiency 08/23/2018    Myrene Galas, PTA 06/16/20 1:35 PM PHYSICAL THERAPY DISCHARGE SUMMARY  Visits from Start of Care: 4  Current functional level related to goals / functional outcomes: See above for current status.  Pt will have surgery and will be discharged from PT due to this.     Remaining deficits: Rt shoulder pain and limited strength and  endurance due to non-intact rotator cuff muscles.    Education / Equipment: Use of Home TENs, HEP Plan: Patient agrees to discharge.  Patient goals were not met. Patient is being discharged due to the patient's request.  ?????        Sigurd Sos, PT 06/16/20 3:35 PM   [facility] [address] [place], [place], [address] Phone: [phone]   Fax:  [phone]  Name: Mari[place]ula Krist MRN: [id number] Date of Birth: 02-Sep-1941

## 2020-06-18 ENCOUNTER — Ambulatory Visit: Payer: Medicare HMO

## 2020-06-23 ENCOUNTER — Encounter: Payer: Medicare HMO | Admitting: Physical Therapy

## 2020-06-25 ENCOUNTER — Encounter: Payer: Medicare HMO | Admitting: Physical Therapy

## 2020-09-05 ENCOUNTER — Ambulatory Visit: Payer: Medicare HMO | Admitting: Physical Therapy

## 2020-09-09 ENCOUNTER — Ambulatory Visit: Payer: Medicare HMO

## 2020-09-17 ENCOUNTER — Ambulatory Visit: Payer: Medicare HMO

## 2020-09-19 ENCOUNTER — Encounter: Payer: Medicare HMO | Admitting: Physical Therapy

## 2020-09-23 ENCOUNTER — Ambulatory Visit: Payer: Medicare HMO | Admitting: Physical Therapy

## 2020-10-09 ENCOUNTER — Encounter: Payer: Medicare HMO | Admitting: Physical Therapy

## 2020-10-15 ENCOUNTER — Encounter: Payer: Self-pay | Admitting: Physical Therapy

## 2020-10-15 ENCOUNTER — Ambulatory Visit: Payer: Medicare HMO | Attending: Physician Assistant | Admitting: Physical Therapy

## 2020-10-15 ENCOUNTER — Other Ambulatory Visit: Payer: Self-pay

## 2020-10-15 DIAGNOSIS — M25511 Pain in right shoulder: Secondary | ICD-10-CM | POA: Diagnosis not present

## 2020-10-15 DIAGNOSIS — R293 Abnormal posture: Secondary | ICD-10-CM | POA: Diagnosis present

## 2020-10-15 DIAGNOSIS — M6281 Muscle weakness (generalized): Secondary | ICD-10-CM | POA: Diagnosis present

## 2020-10-15 DIAGNOSIS — R262 Difficulty in walking, not elsewhere classified: Secondary | ICD-10-CM | POA: Insufficient documentation

## 2020-10-15 DIAGNOSIS — G8929 Other chronic pain: Secondary | ICD-10-CM | POA: Insufficient documentation

## 2020-10-15 DIAGNOSIS — M25611 Stiffness of right shoulder, not elsewhere classified: Secondary | ICD-10-CM | POA: Insufficient documentation

## 2020-10-15 NOTE — Patient Instructions (Signed)
Access Code: [email or web address] URL: [email or web address] Date: 10/15/2020 Prepared by: Venetia Night Avi Kerschner  Exercises Seated Shoulder Flexion Towel Slide at Table Top - 3 x daily - 7 x weekly - 1 sets - 10 reps Seated Shoulder Scaption Slide at Table Top with Forearm in Neutral - 3 x daily - 7 x weekly - 1 sets - 10 reps Supine Shoulder Flexion AAROM with Hands Clasped - 1 x daily - 7 x weekly - 1 sets - 10 reps Seated Elbow Flexion and Extension AROM - 3 x daily - 7 x weekly - 1 sets - 10 reps Wrist Circumduction AROM - 3 x daily - 7 x weekly - 1 sets - 10 reps

## 2020-10-15 NOTE — Therapy (Signed)
[facility] [address], [place], [place], [address] Phone: [phone]   Fax:  [phone]  Physical Therapy Evaluation  Patient Details  Name: Pamela Cox MRN: [id number] Date of Birth: June 26, 1941 Referring Provider (PT): Ervin Knack Ewing, PA   Encounter Date: 10/15/2020   PT End of Session - 10/15/20 1031    Visit Number 1    Date for PT Re-Evaluation 01/07/21    Authorization Type A[employer]na Medicare    Progress Note Due on Visit 10    PT Start Time 0800    PT Stop Time 0850    PT Time Calculation (min) 50 min    Equipment Utilized During Treatment Other (comment)   rollator seat, mod/max transfers x 2   Activity Tolerance Patient tolerated treatment well    Behavior During Therapy [facility] for tasks assessed/performed           Past Medical History:  Diagnosis Date  . Arthritis   . CML (chronic myelocytic leukemia) ([place])   . CVA (cerebral vascular accident) ([place])   . Diabetes mellitus without complication ([place])   . GERD (gastroesophageal reflux disease)   . Hypertension   . Myelocytic leukemia, chronic ([place])   . Obesity   . Sleep apnea     Past Surgical History:  Procedure Laterality Date  . APPENDECTOMY    . COLONOSCOPY    . COLONOSCOPY WITH PROPOFOL N/A 07/16/2019   Procedure: COLONOSCOPY WITH PROPOFOL;  Surgeon: Lollie Sails, MD;  Location[facility] ENDOSCOPY;  Service: Endoscopy;  Laterality: N/A;  . ESOPHAGOGASTRODUODENOSCOPY (EGD) WITH PROPOFOL N/A 07/16/2019   Procedure: ESOPHAGOGASTRODUODENOSCOPY (EGD) WITH PROPOFOL;  Surgeon: Lollie Sails, MD;  Location: [facility] ENDOSCOPY;  Service: Endoscopy;  Laterality: N/A;  . TUBAL LIGATION      There were no vitals filed for this visit.    Subjective Assessment - 10/15/20 0801    Subjective Pt is 4 weeks s/p Rt reverse TSA performed by Dr. Donnie Coffin on 09/17/20.  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  She had a fall 02/2020 with traumatic dislocation and ongoing pain since then.  Post-op discharge was to SNF where she worked on basic mobility, transfers, ADLs and gait with Rt UE in sling.  She just got d/c'd from SNF yesterday.  Pt lives with adult daughter.  Living on main level with hospital bed.  Needs assistance with transfers and mobility.    Patient is accompained by: Family member   adult daughter   Pertinent History avoid reaching behind back and across shoulder, NO UBE AT ANY PHASE PER PROTOCOL, Pt cleared to remove sling for ADLs, wear sling at night and in community    Limitations Lifting;House hold activities;Other (comment)   ADLs   Patient Stated Goals laundry, dishes, cooking    Currently in Pain? No/denies              [facility] PT Assessment - 10/15/20 0001      Assessment   Medical Diagnosis s/p Right reverse TSA    Referring Provider (PT) Ervin Knack Ewing, PA    Onset Date/Surgical Date 09/17/20    Next MD Visit 10/27/20    Prior Therapy not for shoulder      Precautions   Precautions Fall;Other (comment)    Precaution Comments Pt cleared to remove sling for ADLs but wear at night and in community  Restrictions   Weight Bearing Restrictions Yes    Other Position/Activity Restrictions no WB through Rt UE      Balance Screen   Has the patient fallen in the past 6 months Yes    How many times? 1    Has the patient had a decrease in activity level because of a fear of falling?  Yes    Is the patient reluctant to leave their home because of a fear of falling?  Yes      [place];Other relatives    Available Help at Discharge Family    Type of [place] Access Level entry    [place] Two level;Able to live on main level with bedroom/bathroom    [facility] bed;Toilet riser    Additional Comments has rollator but no Rt UE  WB so can't use for now, doesn't have hemiwalker that was [facility] at [facility]      Prior Function   Level of Independence Needs assistance with ADLs;Needs assistance with gait;Needs assistance with transfers;Requires assistive device for independence    Vocation Retired      Charity fundraiser Status Within Functional Limits for tasks assessed      Observation/Other Assessments   Observations Pt arrives in Rt UE sling, seated in rollator, non-amublatory at this time due to non-WB through Rt UE (usually uses rollator)    Skin Integrity good healing of anterior shoulder incision, needs scar mobs    Focus on Therapeutic Outcomes (FOTO)  do next time, wouldn't load      Functional Tests   Functional tests Other      Other:   Other/ Comments mod/max assist needed for all transfers, daughter helped PT with sit to stand and bed transfers      ROM / Strength   AROM / PROM / Strength PROM;Strength;AROM      AROM   AROM Assessment Site Elbow    Right/Left Elbow Right    Right Elbow Extension -10      PROM   PROM Assessment Site Shoulder    Right/Left Shoulder Right    Right Shoulder Flexion 95 Degrees    Right Shoulder External Rotation 10 Degrees      Strength   Overall Strength Comments not formally assessed per post-surgical, Pt able to hold arm at 90 deg in supine when positioned by PT      Palpation   Palpation comment Rt bicep at elbow      Transfers   Comments mod/max assist x 2 for all transfers      Ambulation/Gait   Gait Comments non-ambulatory at this time secondary to non-WB through Rt UE (usually uses rollator)                      Objective measurements completed on examination: See above findings.       [facility] Adult PT Treatment/Exercise - 10/15/20 0001      Self-Care   Self-Care Scar Mobilizations;Other Self-Care Comments    Scar Mobilizations Rt anterior shoulder, instructed Pt's daugher through demo    Other Self-Care Comments  reviewed  shoulder precautions, sling use at night and in community, encouraged Pt to remove sling and use Rt UE for ADLs like eating and teeth brushing as able      Exercises   Exercises Shoulder;Elbow      Elbow Exercises  Elbow Flexion AROM;Right;10 reps    Elbow Extension AROM;10 reps;Right    Other elbow exercises wrist circles x 10 each way      Shoulder Exercises: Supine   Flexion PROM;10 reps    Flexion Limitations use Lt UE to lift Rt UE to 90 deg      Shoulder Exercises: Seated   Other Seated Exercises P/ROM table slides for flexion and scaption                  PT Education - 10/15/20 0852    Education Details Access Code: [email or web address]    Person(s) Educated Patient;Child(ren)    Methods Explanation;Demonstration;Handout    Comprehension Verbalized understanding;Returned demonstration            PT Short Term Goals - 10/15/20 1041      PT SHORT TERM GOAL #1   Title Pt will be ind with initial HEP following protocol for reverse TSA with pain not to exceed 3/10.    Time 3    Period Weeks    Status New    Target Date 11/05/20      PT SHORT TERM GOAL #2   Title Pt will report pain level </= 3/10 with incorporation of Rt UE for basic ADLs.    Time 4    Period Weeks    Status New    Target Date 11/12/20      PT SHORT TERM GOAL #3   Title Pt will achieve AA/ROM or A/ROM to at least 110 deg elevation and 30 deg ER.    Baseline 95 elevation, 10 ER    Time 4    Period Weeks    Status New    Target Date 11/12/20      PT SHORT TERM GOAL #4   Title Pt will demo submax isometric of all heads of deltoid without pain.    Time 4    Period Weeks    Status New    Target Date 11/12/20             PT Long Term Goals - 10/15/20 1044      PT LONG TERM GOAL #1   Title Pt will achieve Rt shoulder A/ROM of 110+ elevation, 40 ER, and functional IR to at least L5 for improved reaching tasks and ADLs such as dressing and bathing.    Time 8    Period Weeks    Status New       PT LONG TERM GOAL #2   Title Pt will improve Rt functional shoulder strength for incorporation of Rt UE use for light household tasks such as cleaning, cooking, and laundry with 25lb lifting restriction.    Time 12      PT LONG TERM GOAL #3   Title FOTO goal to be set when FOTO captured at next visit    Time 12    Period Weeks      PT LONG TERM GOAL #4   Title Pt will be able to return to household and short distance community ambulation with rollator with good stability of Rt UE and min/no pain in Rt shoulder    Time 12    Period Weeks      PT LONG TERM GOAL #5   Title -                  Plan - 10/15/20 1032    Clinical Impression Statement Pt is 4 weeks s/p Rt reverse TSA following a traumatic  dislocation from a fall in April 2021.  She previously had significant RC tears with retraction diagnosed in 2017 so has a long history of chronic weakness and disuse of Rt shoulder.  Pt was accompanied by her adult daughter whom with she lives.  Pt is using hosptial bed on main level of the house and is non-ambulatory at this time secondary to NWB through Rt UE (used to use rollator for gait).  Pt was at SNF x 4 weeks post-op for basic mobility and used hemiwalker there.  No shoulder protocol was initiated at [facility].  Pt requires mod/max assist x 2 for all transfers due to weakness.  She has high fall risk.  Pt is cleared to remove sling for basic ADLs but has not tried this yet.  Pt has limited elbow ext -10 from being in sling.  She has good healing of scar but scar is stiff and ready for scar mobs which PT instructed Pt and her daughter on today.  P/ROM of shoulder is 95 deg flexion and 10 deg ER without pain.  Pt was able to perform table slides and was able to hold arm at 90 deg in supine when placed there without pain or difficulty.  Pt will benefit from skilled PT along MD protocol for improved motion, strength and use of Rt UE.    Personal Factors and Comorbidities Age;Comorbidity  1;Comorbidity 2;Comorbidity 3+;Time since onset of injury/illness/exacerbation    Comorbidities falls, non-ambulatory at this time due to weakness and non-WB through Rt UE so no rollator, CVA, CML, DM, obesity    Examination-Activity Limitations Bed Mobility;Transfers;Locomotion Level;Stand;Lift;Dressing;Carry;Bathing;Toileting    Examination-Participation Restrictions Community Activity;Cleaning;Meal Prep;Laundry;Shop    Stability/Clinical Decision Making Evolving/Moderate complexity    Clinical Decision Making Moderate    Rehab Potential Good    PT Frequency 3x / week    PT Duration 12 weeks    PT Treatment/Interventions ADLs/Self Care Home Management;Cryotherapy;Electrical Stimulation;Moist Heat;DME Instruction;Gait training;Therapeutic exercise;Therapeutic activities;Functional mobility training;Neuromuscular re-education;Patient/family education;Manual techniques;Passive range of motion;Wheelchair mobility training    PT Next Visit Plan capture FOTO, review HEP, scar mobs, bicep STM and elbow ext stretching, P/ROM Rt UE, initiate shoulder AA/ROM, deltoid isometrics, supine 90 deg stabilization per protocol (hold, punch, circles)    PT Home Exercise Plan Access Code: [email or web address]    Consulted and Agree with Plan of Care Patient           Patient will benefit from skilled therapeutic intervention in order to improve the following deficits and impairments:  Abnormal gait, Decreased range of motion, Difficulty walking, Obesity, Decreased activity tolerance, Pain, Postural dysfunction, Decreased strength, Increased edema, Decreased balance, Hypomobility  Visit Diagnosis: Chronic right shoulder pain - Plan: PT plan of care cert/re-cert  Muscle weakness (generalized) - Plan: PT plan of care cert/re-cert  Abnormal posture - Plan: PT plan of care cert/re-cert  Stiffness of right shoulder, not elsewhere classified - Plan: PT plan of care cert/re-cert  Difficulty in walking, not elsewhere  classified - Plan: PT plan of care cert/re-cert     Problem List Patient Active Problem List   Diagnosis Date Noted  . Allergic rhinitis due to pollen 08/23/2018  . DDD (degenerative disc disease), lumbar 08/23/2018  . Lumbar neuritis 08/23/2018  . 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  . GERD (gastroesophageal reflux disease) 08/23/2018  . Morbid obesity ([place]) 08/23/2018  . Obstructive sleep apnea 08/23/2018  . Intervertebral disc disorder with radiculopathy of lumbar region 08/23/2018  . CML (chronic myelocytic leukemia) ([place]) 08/23/2018  . Hx of myocardial  perfusion scan 08/23/2018  . Osteoarthritis 08/23/2018  . Degenerative arthritis of hip 08/23/2018  . Lumbar spondylosis 08/23/2018  . SOB (shortness of breath) 08/23/2018  . History of CVA (cerebrovascular accident) 08/23/2018  . Essential hypertension 08/23/2018  . Chronic right shoulder pain 08/23/2018  . Complete tear of right rotator cuff 08/23/2018  . Acute pain of left shoulder 08/23/2018  . On antineoplastic chemotherapy 08/23/2018  . Hyperlipidemia 08/23/2018  . Neurogenic claudication due to lumbar spinal stenosis 08/23/2018  . Vitamin D deficiency 08/23/2018    Baruch Merl, PT 10/15/20 10:50 AM   [facility] [address] [place], [place], [address] Phone: [phone]   Fax:  [phone]  Name: Donnah Levert MRN: [id number] Date of Birth: [birth year]

## 2020-10-21 ENCOUNTER — Other Ambulatory Visit: Payer: Self-pay

## 2020-10-21 ENCOUNTER — Ambulatory Visit: Payer: Medicare HMO | Admitting: Physical Therapy

## 2020-10-21 DIAGNOSIS — G8929 Other chronic pain: Secondary | ICD-10-CM

## 2020-10-21 DIAGNOSIS — M25511 Pain in right shoulder: Secondary | ICD-10-CM | POA: Diagnosis not present

## 2020-10-21 DIAGNOSIS — M6281 Muscle weakness (generalized): Secondary | ICD-10-CM

## 2020-10-21 NOTE — Therapy (Signed)
[facility] [address], [place], [place], [address] Phone: [phone]   Fax:  [phone]  Physical Therapy Treatment  Patient Details  Name: Pamela Cox MRN: [id number] Date of Birth: July 19, 1941 Referring Provider (PT): Ervin Knack Ewing, PA   Encounter Date: 10/21/2020   PT End of Session - 10/21/20 1044    Visit Number 2    Date for PT Re-Evaluation 01/07/21    Authorization Type Aetna Medicare    Progress Note Due on Visit 10    PT Start Time 0800    PT Stop Time 0843    PT Time Calculation (min) 43 min    Activity Tolerance Patient tolerated treatment well           Past Medical History:  Diagnosis Date  . Arthritis   . CML (chronic myelocytic leukemia) ([place])   . CVA (cerebral vascular accident) ([place])   . Diabetes mellitus without complication ([place])   . GERD (gastroesophageal reflux disease)   . Hypertension   . Myelocytic leukemia, chronic ([place])   . Obesity   . Sleep apnea     Past Surgical History:  Procedure Laterality Date  . APPENDECTOMY    . COLONOSCOPY    . COLONOSCOPY WITH PROPOFOL N/A 07/16/2019   Procedure: COLONOSCOPY WITH PROPOFOL;  Surgeon: Lollie Sails, MD;  Location: [facility] ENDOSCOPY;  Service: Endoscopy;  Laterality: N/A;  . ESOPHAGOGASTRODUODENOSCOPY (EGD) WITH PROPOFOL N/A 07/16/2019   Procedure: ESOPHAGOGASTRODUODENOSCOPY (EGD) WITH PROPOFOL;  Surgeon: Lollie Sails, MD;  Location: [facility] ENDOSCOPY;  Service: Endoscopy;  Laterality: N/A;  . TUBAL LIGATION      There were no vitals filed for this visit.   Subjective Assessment - 10/21/20 0758    Subjective "These early morning appts are hard on my daughter to get me here."  Reports minimal pain overall, only with movement not at rest.    Pertinent History avoid reaching behind back and across shoulder, NO UBE AT ANY PHASE PER PROTOCOL, Pt cleared to remove sling for ADLs, wear sling at night and in community     Currently in Pain? No/denies              [facility] PT Assessment - 10/21/20 0001      Observation/Other Assessments   Focus on Therapeutic Outcomes (FOTO)  68% limitation                          [facility] Adult PT Treatment/Exercise - 10/21/20 0001      Bed Mobility   Rolling Left Minimal Assistance - Patient > 75%    Left Sidelying to Sit Minimal Assistance - Patient >75%      Transfers   Comments mod/max assist x 2 for all transfers      Shoulder Exercises: Supine   Protraction Right;5 reps;Limitations    Protraction Limitations 2 sets:first set needs assist, 2nd set without assist     Other Supine Exercises circles at 90 degrees 2 sets of 5     Other Supine Exercises small arcs light assist 12/6:00 5x       Shoulder Exercises: Seated   Other Seated Exercises UE Ranger 5x on floor       Shoulder Exercises: Sidelying   ABduction AAROM;Right;10 reps    ABduction Limitations 5 seps bent elbow abduction     Other Sidelying Exercises supported reaching forward 2 sets of 5       Shoulder Exercises:  Isometric Strengthening   Flexion 5X5"    Flexion Limitations seated     Extension 5X5"    Extension Limitations seated    ABduction 5X5"    ABduction Limitations seated       Manual Therapy   Manual therapy comments review of self scar mob;  gentle soft tissue work to upper traps and deltoids     Passive ROM flexion 120 degrees, abduction 45 degrees                     PT Short Term Goals - 10/15/20 1041      PT SHORT TERM GOAL #1   Title Pt will be ind with initial HEP following protocol for reverse TSA with pain not to exceed 3/10.    Time 3    Period Weeks    Status New    Target Date 11/05/20      PT SHORT TERM GOAL #2   Title Pt will report pain level </= 3/10 with incorporation of Rt UE for basic ADLs.    Time 4    Period Weeks    Status New    Target Date 11/12/20      PT SHORT TERM GOAL #3   Title Pt will achieve AA/ROM or A/ROM to  at least 110 deg elevation and 30 deg ER.    Baseline 95 elevation, 10 ER    Time 4    Period Weeks    Status New    Target Date 11/12/20      PT SHORT TERM GOAL #4   Title Pt will demo submax isometric of all heads of deltoid without pain.    Time 4    Period Weeks    Status New    Target Date 11/12/20             PT Long Term Goals - 10/21/20 1052      PT LONG TERM GOAL #1   Title Pt will achieve Rt shoulder A/ROM of 110+ elevation, 40 ER, and functional IR to at least L5 for improved reaching tasks and ADLs such as dressing and bathing.    Time 8    Period Weeks    Status New      PT LONG TERM GOAL #2   Title Pt will improve Rt functional shoulder strength for incorporation of Rt UE use for light household tasks such as cleaning, cooking, and laundry with 25lb lifting restriction.    Time 12    Period Weeks    Status New      PT LONG TERM GOAL #3   Title FOTO score improved from 68% limitation to 39%    Time 12    Period Weeks    Status New      PT LONG TERM GOAL #4   Title Pt will be able to return to household and short distance community ambulation with rollator with good stability of Rt UE and min/no pain in Rt shoulder    Time 12    Period Weeks    Status New                 Plan - 10/21/20 1044    Clinical Impression Statement The patient continues to require max assist with transfers to and from the wheelchair.  She is able to roll on the large mat table and transfer sidelying to sit with min assist today.   Her pain level remains low throughout treatment session  including passive and active assisted ROM.  She initially requires assist to hold her arm at 90 degrees when placed there but eventually she is able to hold there for 10 seconds on her own.  She continues to need much assistance with ADLs at home as indicated by her FOTO score of 68% limitaiton.  Therapist closely monitoring response and adherence to surgical precautions per MD protocol.     Personal Factors and Comorbidities Age;Comorbidity 1;Comorbidity 2;Comorbidity 3+;Time since onset of injury/illness/exacerbation    Comorbidities falls, non-ambulatory at this time due to weakness and non-WB through Rt UE so no rollator, CVA, CML, DM, obesity    Rehab Potential Good    PT Frequency 3x / week    PT Duration 12 weeks    PT Treatment/Interventions ADLs/Self Care Home Management;Cryotherapy;Electrical Stimulation;Moist Heat;DME Instruction;Gait training;Therapeutic exercise;Therapeutic activities;Functional mobility training;Neuromuscular re-education;Patient/family education;Manual techniques;Passive range of motion;Wheelchair mobility training    PT Next Visit Plan scar mobs, bicep STM and elbow ext stretching, P/ROM Rt UE, initiate shoulder AA/ROM, deltoid isometrics, supine 90 deg stabilization per protocol (hold, punch, circles)    PT Home Exercise Plan Access Code: [email or web address]           Patient will benefit from skilled therapeutic intervention in order to improve the following deficits and impairments:  Abnormal gait, Decreased range of motion, Difficulty walking, Obesity, Decreased activity tolerance, Pain, Postural dysfunction, Decreased strength, Increased edema, Decreased balance, Hypomobility  Visit Diagnosis: Chronic right shoulder pain  Muscle weakness (generalized)     Problem List Patient Active Problem List   Diagnosis Date Noted  . Allergic rhinitis due to pollen 08/23/2018  . DDD (degenerative disc disease), lumbar 08/23/2018  . Lumbar neuritis 08/23/2018  . 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  . GERD (gastroesophageal reflux disease) 08/23/2018  . Morbid obesity ([place]) 08/23/2018  . Obstructive sleep apnea 08/23/2018  . Intervertebral disc disorder with radiculopathy of lumbar region 08/23/2018  . CML (chronic myelocytic leukemia) ([place]) 08/23/2018  . Hx of myocardial perfusion scan 08/23/2018  . Osteoarthritis 08/23/2018  . Degenerative arthritis of hip  08/23/2018  . Lumbar spondylosis 08/23/2018  . SOB (shortness of breath) 08/23/2018  . History of CVA (cerebrovascular accident) 08/23/2018  . Essential hypertension 08/23/2018  . Chronic right shoulder pain 08/23/2018  . Complete tear of right rotator cuff 08/23/2018  . Acute pain of left shoulder 08/23/2018  . On antineoplastic chemotherapy 08/23/2018  . Hyperlipidemia 08/23/2018  . Neurogenic claudication due to lumbar spinal stenosis 08/23/2018  . Vitamin D deficiency 08/23/2018   Ruben Im, PT 10/21/20 10:53 AM Phone: [phone] Fax: [phone] Alvera Singh 10/21/2020, 10:53 AM  [facility] [address] [place], [place], [address] Phone: [phone]   Fax:  [phone]  Name: Merl Guardino MRN: [id number] Date of Birth: [birth year]

## 2020-10-23 ENCOUNTER — Ambulatory Visit: Payer: Medicare HMO | Attending: Physician Assistant | Admitting: Physical Therapy

## 2020-10-23 ENCOUNTER — Other Ambulatory Visit: Payer: Self-pay

## 2020-10-23 DIAGNOSIS — R293 Abnormal posture: Secondary | ICD-10-CM | POA: Diagnosis present

## 2020-10-23 DIAGNOSIS — M25611 Stiffness of right shoulder, not elsewhere classified: Secondary | ICD-10-CM | POA: Diagnosis present

## 2020-10-23 DIAGNOSIS — M6281 Muscle weakness (generalized): Secondary | ICD-10-CM | POA: Diagnosis present

## 2020-10-23 DIAGNOSIS — G8929 Other chronic pain: Secondary | ICD-10-CM

## 2020-10-23 DIAGNOSIS — M25511 Pain in right shoulder: Secondary | ICD-10-CM | POA: Diagnosis present

## 2020-10-23 NOTE — Therapy (Signed)
[facility] [address], [place], [place], [address] Phone: [phone]   Fax:  [phone]  Physical Therapy Treatment  Patient Details  Name: Pamela Cox MRN: [id number] Date of Birth: 1941-10-16 Referring Provider (PT): Ervin Knack Ewing, PA   Encounter Date: 10/23/2020   PT End of Session - 10/23/20 1042    Visit Number 3    Date for PT Re-Evaluation 01/07/21    Authorization Type [employer]    Progress Note Due on Visit 10    PT Start Time 0844    PT Stop Time 0925    PT Time Calculation (min) 41 min    Activity Tolerance Patient tolerated treatment well           Past Medical History:  Diagnosis Date  . Arthritis   . CML (chronic myelocytic leukemia) ([place])   . CVA (cerebral vascular accident) ([place])   . Diabetes mellitus without complication ([place])   . GERD (gastroesophageal reflux disease)   . Hypertension   . Myelocytic leukemia, chronic ([place])   . Obesity   . Sleep apnea     Past Surgical History:  Procedure Laterality Date  . APPENDECTOMY    . COLONOSCOPY    . COLONOSCOPY WITH PROPOFOL N/A 07/16/2019   Procedure: COLONOSCOPY WITH PROPOFOL;  Surgeon: Lollie Sails, MD;  Location: [facility] ENDOSCOPY;  Service: Endoscopy;  Laterality: N/A;  . ESOPHAGOGASTRODUODENOSCOPY (EGD) WITH PROPOFOL N/A 07/16/2019   Procedure: ESOPHAGOGASTRODUODENOSCOPY (EGD) WITH PROPOFOL;  Surgeon: Lollie Sails, MD;  Location: [facility] ENDOSCOPY;  Service: Endoscopy;  Laterality: N/A;  . TUBAL LIGATION      There were no vitals filed for this visit.   Subjective Assessment - 10/23/20 0841    Subjective Reports some increased soreness after last visit "but not too bad." "I'm stiff today."  The patient reports the back of her heels are sore.  States she has not attempted to stand much with transfers "b/c I'm scared".  Reports she does some sitting leg raises.    Pertinent History avoid reaching behind back and  across shoulder, NO UBE AT ANY PHASE PER PROTOCOL, Pt cleared to remove sling for ADLs, wear sling at night and in community    Currently in Pain? Yes    Pain Score 3     Pain Location Shoulder    Pain Orientation Right                             [facility] Adult PT Treatment/Exercise - 10/23/20 0001      Bed Mobility   Rolling Left Set up assist    Left Sidelying to Sit Set up assist      Transfers   Comments mod assist x2      Ambulation/Gait   Gait Comments standing with min assist +2 1 minute       Shoulder Exercises: Supine   Protraction Right;5 reps;Limitations    Protraction Limitations 2 sets:first set needs assist, 2nd set without assist     Other Supine Exercises circles at 90 degrees 2 sets of 5     Other Supine Exercises small arcs light assist 12/6:00 5x       Shoulder Exercises: Seated   Other Seated Exercises UE Ranger 10x on floor     Other Seated Exercises scapular retraction/sit talls 10x       Shoulder Exercises: Sidelying   ABduction AAROM;Right;10 reps  Other Sidelying Exercises supported reaching forward 2 sets of 5       Shoulder Exercises: Isometric Strengthening   Flexion 5X5"    Flexion Limitations seated     Extension 5X5"    Extension Limitations seated    ABduction 5X5"    ABduction Limitations seated       Manual Therapy   Manual therapy comments soft tissue work light to upper trap     Passive ROM elbow extension, elevation 125 degrees, external rotation 20 degrees                     PT Short Term Goals - 10/15/20 1041      PT SHORT TERM GOAL #1   Title Pt will be ind with initial HEP following protocol for reverse TSA with pain not to exceed 3/10.    Time 3    Period Weeks    Status New    Target Date 11/05/20      PT SHORT TERM GOAL #2   Title Pt will report pain level </= 3/10 with incorporation of Rt UE for basic ADLs.    Time 4    Period Weeks    Status New    Target Date 11/12/20      PT  SHORT TERM GOAL #3   Title Pt will achieve AA/ROM or A/ROM to at least 110 deg elevation and 30 deg ER.    Baseline 95 elevation, 10 ER    Time 4    Period Weeks    Status New    Target Date 11/12/20      PT SHORT TERM GOAL #4   Title Pt will demo submax isometric of all heads of deltoid without pain.    Time 4    Period Weeks    Status New    Target Date 11/12/20             PT Long Term Goals - 10/21/20 1052      PT LONG TERM GOAL #1   Title Pt will achieve Rt shoulder A/ROM of 110+ elevation, 40 ER, and functional IR to at least L5 for improved reaching tasks and ADLs such as dressing and bathing.    Time 8    Period Weeks    Status New      PT LONG TERM GOAL #2   Title Pt will improve Rt functional shoulder strength for incorporation of Rt UE use for light household tasks such as cleaning, cooking, and laundry with 25lb lifting restriction.    Time 12    Period Weeks    Status New      PT LONG TERM GOAL #3   Title FOTO score improved from 68% limitation to 39%    Time 12    Period Weeks    Status New      PT LONG TERM GOAL #4   Title Pt will be able to return to household and short distance community ambulation with rollator with good stability of Rt UE and min/no pain in Rt shoulder    Time 12    Period Weeks    Status New                 Plan - 10/23/20 1043    Clinical Impression Statement The patient has more discomfort with ROM today that last visit but less than 5/10 throughout session and she report less pain at the conclusion of session than when she arrives.  External rotation ROM limited to 20 degrees secondary to stiffness.  Discussed pressure relief for her heels and encouraged more upright sitting at home vs. bed or recliner.  Discussed LE ex's to slow muscle atrophy since she is nonambulatory.  Initiated standing for strengthening purposes with 1 minute tolerated once assisted from sit to stand.  The patient is fearful of physical activity and  will need a slower progression of mobility.    Comorbidities falls, non-ambulatory at this time due to weakness and non-WB through Rt UE so no rollator, CVA, CML, DM, obesity    Examination-Activity Limitations Bed Mobility;Transfers;Locomotion Level;Stand;Lift;Dressing;Carry;Bathing;Toileting    Rehab Potential Good    PT Frequency 3x / week    PT Duration 12 weeks    PT Treatment/Interventions ADLs/Self Care Home Management;Cryotherapy;Electrical Stimulation;Moist Heat;DME Instruction;Gait training;Therapeutic exercise;Therapeutic activities;Functional mobility training;Neuromuscular re-education;Patient/family education;Manual techniques;Passive range of motion;Wheelchair mobility training    PT Next Visit Plan elbow ext stretching, P/ROM Rt UE,  shoulder AA/ROM, deltoid isometrics, supine 90 deg stabilization per protocol (hold, punch, circles); sidelying ROM;  protocol progression on 12/8;  increase standing time and standing/step transfers, try sit to stand from higher surface; discuss with patient's daughter standing practice at home    PT Home Exercise Plan Access Code: [email or web address]           Patient will benefit from skilled therapeutic intervention in order to improve the following deficits and impairments:  Abnormal gait, Decreased range of motion, Difficulty walking, Obesity, Decreased activity tolerance, Pain, Postural dysfunction, Decreased strength, Increased edema, Decreased balance, Hypomobility  Visit Diagnosis: Chronic right shoulder pain  Muscle weakness (generalized)  Abnormal posture  Stiffness of right shoulder, not elsewhere classified     Problem List Patient Active Problem List   Diagnosis Date Noted  . Allergic rhinitis due to pollen 08/23/2018  . DDD (degenerative disc disease), lumbar 08/23/2018  . Lumbar neuritis 08/23/2018  . 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  . GERD (gastroesophageal reflux disease) 08/23/2018  . Morbid obesity ([place]) 08/23/2018  .  Obstructive sleep apnea 08/23/2018  . Intervertebral disc disorder with radiculopathy of lumbar region 08/23/2018  . CML (chronic myelocytic leukemia) ([place]) 08/23/2018  . Hx of myocardial perfusion scan 08/23/2018  . Osteoarthritis 08/23/2018  . Degenerative arthritis of hip 08/23/2018  . Lumbar spondylosis 08/23/2018  . SOB (shortness of breath) 08/23/2018  . History of CVA (cerebrovascular accident) 08/23/2018  . Essential hypertension 08/23/2018  . Chronic right shoulder pain 08/23/2018  . Complete tear of right rotator cuff 08/23/2018  . Acute pain of left shoulder 08/23/2018  . On antineoplastic chemotherapy 08/23/2018  . Hyperlipidemia 08/23/2018  . Neurogenic claudication due to lumbar spinal stenosis 08/23/2018  . Vitamin D deficiency 08/23/2018   Ruben Im, PT 10/23/20 10:55 AM Phone: [phone] Fax: [phone] Alvera Singh 10/23/2020, 10:55 AM  [facility] [address] [place], [place], [address] Phone: [phone]   Fax:  [phone]  Name: Pamela Cox MRN: [id number] Date of Birth: November 21, 1941

## 2020-10-28 ENCOUNTER — Other Ambulatory Visit: Payer: Self-pay

## 2020-10-28 ENCOUNTER — Ambulatory Visit: Payer: Medicare HMO | Admitting: Physical Therapy

## 2020-10-28 DIAGNOSIS — M6281 Muscle weakness (generalized): Secondary | ICD-10-CM

## 2020-10-28 DIAGNOSIS — R293 Abnormal posture: Secondary | ICD-10-CM

## 2020-10-28 DIAGNOSIS — M25611 Stiffness of right shoulder, not elsewhere classified: Secondary | ICD-10-CM

## 2020-10-28 DIAGNOSIS — M25511 Pain in right shoulder: Secondary | ICD-10-CM | POA: Diagnosis not present

## 2020-10-28 DIAGNOSIS — G8929 Other chronic pain: Secondary | ICD-10-CM

## 2020-10-28 NOTE — Therapy (Signed)
[facility] [address], [place], [place], [address] Phone: [phone]   Fax:  [phone]  Physical Therapy Treatment  Patient Details  Name: Pamela Cox MRN: [id number] Date of Birth: 1941/05/06 Referring Provider (PT): Ervin Knack Ewing, PA   Encounter Date: 10/28/2020   PT End of Session - 10/28/20 1640    Visit Number 4    Date for PT Re-Evaluation 01/07/21    Authorization Type [employer] Medicare    Progress Note Due on Visit 10    PT Start Time 1445    PT Stop Time 1526    PT Time Calculation (min) 41 min    Activity Tolerance Patient tolerated treatment well           Past Medical History:  Diagnosis Date  . Arthritis   . CML (chronic myelocytic leukemia) ([place])   . CVA (cerebral vascular accident) ([place])   . Diabetes mellitus without complication ([place])   . GERD (gastroesophageal reflux disease)   . Hypertension   . Myelocytic leukemia, chronic ([place])   . Obesity   . Sleep apnea     Past Surgical History:  Procedure Laterality Date  . APPENDECTOMY    . COLONOSCOPY    . COLONOSCOPY WITH PROPOFOL N/A 07/16/2019   Procedure: COLONOSCOPY WITH PROPOFOL;  Surgeon: Lollie Sails, MD;  Location: [facility] ENDOSCOPY;  Service: Endoscopy;  Laterality: N/A;  . ESOPHAGOGASTRODUODENOSCOPY (EGD) WITH PROPOFOL N/A 07/16/2019   Procedure: ESOPHAGOGASTRODUODENOSCOPY (EGD) WITH PROPOFOL;  Surgeon: Lollie Sails, MD;  Location: [facility] ENDOSCOPY;  Service: Endoscopy;  Laterality: N/A;  . TUBAL LIGATION      There were no vitals filed for this visit.   Subjective Assessment - 10/28/20 1530    Subjective "I still wear the sling b/c when I hang it down it hurts."  She reports she saw the doctor and they were pleased with how her shoulder is healing.  States she was told she didn't have to wear the sling but she prefers to wear it ("fearful of messing her shoulder up").   The patient's daughter states she has been  taking a few steps lately.    Pertinent History avoid reaching behind back and across shoulder, NO UBE AT ANY PHASE PER PROTOCOL, Pt cleared to remove sling for ADLs, wear sling at night and in community    Currently in Pain? No/denies    Pain Score 0-No pain    Pain Location Shoulder    Pain Orientation Right                             [facility] Adult PT Treatment/Exercise - 10/28/20 0001      Bed Mobility   Rolling Left Set up assist    Left Sidelying to Sit Set up assist      Transfers   Transfer Cueing transferred without sling on however needs cues to avoid using her right arm to push     Comments mod assist x2      Ambulation/Gait   Gait Comments side to side weight shift 2 minutes with CGA  for safety       Therapeutic Activites    Therapeutic Activities ADL's    ADL's standing side to side weight shifting 2 minutes with CGA and pt lightly supporting herself with the back on chair on left      Knee/Hip Exercises: Seated   Other Seated Knee/Hip Exercises hip abduction  with red band loop; knee extension with red loop band       Shoulder Exercises: Supine   Protraction AROM;Right;10 reps    Flexion AROM;Right;20 reps    Flexion Limitations 90-120 degree arcs     Other Supine Exercises circles at 90 degrees 2 sets of 5     Other Supine Exercises small arcs side to side 10x       Shoulder Exercises: Seated   Other Seated Exercises UE Ranger 10x on floor     Other Seated Exercises scapular retraction/sit talls 10x       Shoulder Exercises: Sidelying   ABduction AROM;Right;10 reps    ABduction Weight (lbs) light UE support by therapist     ABduction Limitations 10 seps bent elbow abduction     Other Sidelying Exercises supported reaching forward 2 sets of 5       Shoulder Exercises: Isometric Strengthening   Flexion 5X5"    Flexion Limitations seated     Extension 5X5"    Extension Limitations seated    ABduction 5X5"    ABduction Limitations seated        Manual Therapy   Passive ROM elbow extension, elevation 125 degrees, external rotation 20 degrees                     PT Short Term Goals - 10/15/20 1041      PT SHORT TERM GOAL #1   Title Pt will be ind with initial HEP following protocol for reverse TSA with pain not to exceed 3/10.    Time 3    Period Weeks    Status New    Target Date 11/05/20      PT SHORT TERM GOAL #2   Title Pt will report pain level </= 3/10 with incorporation of Rt UE for basic ADLs.    Time 4    Period Weeks    Status New    Target Date 11/12/20      PT SHORT TERM GOAL #3   Title Pt will achieve AA/ROM or A/ROM to at least 110 deg elevation and 30 deg ER.    Baseline 95 elevation, 10 ER    Time 4    Period Weeks    Status New    Target Date 11/12/20      PT SHORT TERM GOAL #4   Title Pt will demo submax isometric of all heads of deltoid without pain.    Time 4    Period Weeks    Status New    Target Date 11/12/20             PT Long Term Goals - 10/21/20 1052      PT LONG TERM GOAL #1   Title Pt will achieve Rt shoulder A/ROM of 110+ elevation, 40 ER, and functional IR to at least L5 for improved reaching tasks and ADLs such as dressing and bathing.    Time 8    Period Weeks    Status New      PT LONG TERM GOAL #2   Title Pt will improve Rt functional shoulder strength for incorporation of Rt UE use for light household tasks such as cleaning, cooking, and laundry with 25lb lifting restriction.    Time 12    Period Weeks    Status New      PT LONG TERM GOAL #3   Title FOTO score improved from 68% limitation to 39%    Time 12  Period Weeks    Status New      PT LONG TERM GOAL #4   Title Pt will be able to return to household and short distance community ambulation with rollator with good stability of Rt UE and min/no pain in Rt shoulder    Time 12    Period Weeks    Status New                 Plan - 10/28/20 1641    Clinical Impression Statement  The patient is able to continue with active ROM and assisted ROM supine and sidelying positions with less therapist assistance required.  Her pain level remains low during PT sessions however she reports when she is out of the sling her pain level increases.  Increased standing time and added weight shifting today with left UE support on chair.  She states she has a QC at home "but doesn't like it."  She expresses interest in getting a hemiwalker which she used while in rehab.  Her shoulder is progressing as expected per protocol and she should be able to advance to phase 3 for post week 6 next visit.  Therapist closely monitoring response with all treatment interventions.    Comorbidities falls, non-ambulatory at this time due to weakness and non-WB through Rt UE so no rollator, CVA, CML, DM, obesity    Rehab Potential Good    PT Frequency 3x / week    PT Duration 12 weeks    PT Treatment/Interventions ADLs/Self Care Home Management;Cryotherapy;Electrical Stimulation;Moist Heat;DME Instruction;Gait training;Therapeutic exercise;Therapeutic activities;Functional mobility training;Neuromuscular re-education;Patient/family education;Manual techniques;Passive range of motion;Wheelchair mobility training    PT Next Visit Plan 6 week post protocol: forward elevation start on incline with short lever arm, 0 degree internal and external rotation; scapular retract with yellow band; functional internal rotation with hand slide up back; increase standing tolerance with progression to taking a few steps    PT Home Exercise Plan Access Code: [email or web address]           Patient will benefit from skilled therapeutic intervention in order to improve the following deficits and impairments:  Abnormal gait, Decreased range of motion, Difficulty walking, Obesity, Decreased activity tolerance, Pain, Postural dysfunction, Decreased strength, Increased edema, Decreased balance, Hypomobility  Visit Diagnosis: Chronic right shoulder  pain  Muscle weakness (generalized)  Abnormal posture  Stiffness of right shoulder, not elsewhere classified     Problem List Patient Active Problem List   Diagnosis Date Noted  . Allergic rhinitis due to pollen 08/23/2018  . DDD (degenerative disc disease), lumbar 08/23/2018  . Lumbar neuritis 08/23/2018  . 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  . GERD (gastroesophageal reflux disease) 08/23/2018  . Morbid obesity ([place]) 08/23/2018  . Obstructive sleep apnea 08/23/2018  . Intervertebral disc disorder with radiculopathy of lumbar region 08/23/2018  . CML (chronic myelocytic leukemia) ([place]) 08/23/2018  . Hx of myocardial perfusion scan 08/23/2018  . Osteoarthritis 08/23/2018  . Degenerative arthritis of hip 08/23/2018  . Lumbar spondylosis 08/23/2018  . SOB (shortness of breath) 08/23/2018  . History of CVA (cerebrovascular accident) 08/23/2018  . Essential hypertension 08/23/2018  . Chronic right shoulder pain 08/23/2018  . Complete tear of right rotator cuff 08/23/2018  . Acute pain of left shoulder 08/23/2018  . On antineoplastic chemotherapy 08/23/2018  . Hyperlipidemia 08/23/2018  . Neurogenic claudication due to lumbar spinal stenosis 08/23/2018  . Vitamin D deficiency 08/23/2018    Alvera Singh 10/28/2020, 4:51 PM  [facility] [address] [place], [place], [address] Phone: [phone]   Fax:  [phone]  Name: Pamela Cox MRN: [id number] Date of Birth: 07-07-1941

## 2020-11-04 ENCOUNTER — Other Ambulatory Visit: Payer: Self-pay

## 2020-11-04 ENCOUNTER — Ambulatory Visit: Payer: Medicare HMO | Admitting: Physical Therapy

## 2020-11-04 DIAGNOSIS — R293 Abnormal posture: Secondary | ICD-10-CM

## 2020-11-04 DIAGNOSIS — M25511 Pain in right shoulder: Secondary | ICD-10-CM | POA: Diagnosis not present

## 2020-11-04 DIAGNOSIS — M25611 Stiffness of right shoulder, not elsewhere classified: Secondary | ICD-10-CM

## 2020-11-04 DIAGNOSIS — M6281 Muscle weakness (generalized): Secondary | ICD-10-CM

## 2020-11-04 NOTE — Therapy (Signed)
[facility] [address] [place], [place], [address] Phone: [phone]   Fax:  [phone]  Physical Therapy Treatment  Patient Details  Name: Pamela Cox MRN: [id number] Date of Birth: August 19, 1941 Referring Provider (PT): Ervin Knack [place], [place]   Encounter Date: 11/04/2020   PT End of Session - 11/04/20 1945    Visit Number 5    Date for PT Re-Evaluation 01/07/21    Authorization Type [employer]    Progress Note Due on Visit 10    PT Start Time 1446    PT Stop Time 1528    PT Time Calculation (min) 42 min    Activity Tolerance Patient tolerated treatment well           Past Medical History:  Diagnosis Date   Arthritis    CML (chronic myelocytic leukemia) ([place])    CVA (cerebral vascular accident) ([place])    Diabetes mellitus without complication ([place])    GERD (gastroesophageal reflux disease)    Hypertension    Myelocytic leukemia, chronic ([place])    Obesity    Sleep apnea     Past Surgical History:  Procedure Laterality Date   APPENDECTOMY     COLONOSCOPY     COLONOSCOPY WITH PROPOFOL N/A 07/16/2019   Procedure: COLONOSCOPY WITH PROPOFOL;  Surgeon: Lollie Sails, MD;  Location: [facility] ENDOSCOPY;  Service: Endoscopy;  Laterality: N/A;   ESOPHAGOGASTRODUODENOSCOPY (EGD) WITH PROPOFOL N/A 07/16/2019   Procedure: ESOPHAGOGASTRODUODENOSCOPY (EGD) WITH PROPOFOL;  Surgeon: Lollie Sails, MD;  Location: [facility] ENDOSCOPY;  Service: Endoscopy;  Laterality: N/A;   TUBAL LIGATION      There were no vitals filed for this visit.   Subjective Assessment - 11/04/20 1937    Subjective I'm doing OK.  I still wear my sling  b/c it feels better to wear it.  I wear it at night but not all day long.    Pertinent History avoid reaching behind back and across shoulder, NO UBE AT ANY PHASE PER PROTOCOL, Pt cleared to remove sling for ADLs, wear sling at night and in community    Patient Stated Goals  laundry, dishes, cooking    Currently in Pain? Yes    Pain Score 3     Pain Location Shoulder    Pain Orientation Right    Pain Type Surgical pain    Aggravating Factors  when I use it some    Pain Relieving Factors wearing the sling                             [facility] Adult PT Treatment/Exercise - 11/04/20 0001      Bed Mobility   Left Sidelying to Sit Minimal Assistance - Patient >75%      Transfers   Comments mod/max assist x 2 for all transfers      Ambulation/Gait   Gait Comments side to side weight shift 2 minutes with CGA  for safety       Self-Care   Other Self-Care Comments  review of protocol and discussion of phase 3 progression      Shoulder Exercises: Supine   Protraction AAROM;Right;20 reps    Flexion AROM;Right;20 reps   small arcs 90-120   Flexion Limitations bent arm elevation to ear 10x; to right side of head 10x      Shoulder Exercises: Seated   Retraction Strengthening;Right;20 reps;Theraband    Theraband Level (Shoulder Retraction) Level  1 (Yellow)    Other Seated Exercises scapular retraction/sit talls 10x       Shoulder Exercises: Sidelying   ABduction AROM;Right;20 reps    ABduction Limitations 10 seps bent elbow abduction       Shoulder Exercises: ROM/Strengthening   Other ROM/Strengthening Exercises gentle internal rotation with hand slides toward right hip/buttock      Manual Therapy   Passive ROM elbow extension, elevation 135 degrees, external rotation 25 degrees                    PT Short Term Goals - 11/04/20 1955      PT SHORT TERM GOAL #1   Title Pt will be ind with initial HEP following protocol for reverse TSA with pain not to exceed 3/10.    Status Achieved      PT SHORT TERM GOAL #2   Title Pt will report pain level </= 3/10 with incorporation of Rt UE for basic ADLs.    Status Achieved      PT SHORT TERM GOAL #3   Title Pt will achieve AA/ROM or A/ROM to at least 110 deg elevation and 30 deg ER.     Status Achieved      PT SHORT TERM GOAL #4   Title Pt will demo submax isometric of all heads of deltoid without pain.    Status Achieved             PT Long Term Goals - 10/21/20 1052      PT LONG TERM GOAL #1   Title Pt will achieve Rt shoulder A/ROM of 110+ elevation, 40 ER, and functional IR to at least L5 for improved reaching tasks and ADLs such as dressing and bathing.    Time 8    Period Weeks    Status New      PT LONG TERM GOAL #2   Title Pt will improve Rt functional shoulder strength for incorporation of Rt UE use for light household tasks such as cleaning, cooking, and laundry with 25lb lifting restriction.    Time 12    Period Weeks    Status New      PT LONG TERM GOAL #3   Title FOTO score improved from 68% limitation to 39%    Time 12    Period Weeks    Status New      PT LONG TERM GOAL #4   Title Pt will be able to return to household and short distance community ambulation with rollator with good stability of Rt UE and min/no pain in Rt shoulder    Time 12    Period Weeks    Status New                 Plan - 11/04/20 1946    Clinical Impression Statement Initial shoulder stiffness improves with ROM ex's with active assisted elevation to 135 degrees.  External rotation ROM still quite limited.  Initiated internal rotation gently with hand slides to right lateral hip/buttock.  The patient reports her shoulder feels better after treatment session and especially feels good with yellow band retractions.  Transfers still require mod/max assist of 2 however once assisted to standing, she is able to stand > 1 minute with min assist.  Therapist monitoring response and adherence to surgical precautions.  All STGs met.    Personal Factors and Comorbidities Age;Comorbidity 1;Comorbidity 2;Comorbidity 3+;Time since onset of injury/illness/exacerbation    Comorbidities falls, non-ambulatory at this time  due to weakness and non-WB through Rt UE so no rollator,  CVA, CML, DM, obesity    Examination-Participation Restrictions Community Activity;Cleaning;Meal Prep;Laundry;Shop    Rehab Potential Good    PT Frequency 3x / week    PT Duration 12 weeks    PT Treatment/Interventions ADLs/Self Care Home Management;Cryotherapy;Electrical Stimulation;Moist Heat;DME Instruction;Gait training;Therapeutic exercise;Therapeutic activities;Functional mobility training;Neuromuscular re-education;Patient/family education;Manual techniques;Passive range of motion;Wheelchair mobility training    PT Next Visit Plan add yellow band retractions to HEP;  progress elevation and external rotation ROM;  functional internal rotation behind back;  increased standing time.    PT Home Exercise Plan Access Code: [email or web address]           Patient will benefit from skilled therapeutic intervention in order to improve the following deficits and impairments:  Abnormal gait,Decreased range of motion,Difficulty walking,Obesity,Decreased activity tolerance,Pain,Postural dysfunction,Decreased strength,Increased edema,Decreased balance,Hypomobility  Visit Diagnosis: Chronic right shoulder pain  Muscle weakness (generalized)  Abnormal posture  Stiffness of right shoulder, not elsewhere classified     Problem List Patient Active Problem List   Diagnosis Date Noted   Allergic rhinitis due to pollen 08/23/2018   DDD (degenerative disc disease), lumbar 08/23/2018   Lumbar neuritis 08/23/2018   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   GERD (gastroesophageal reflux disease) 08/23/2018   Morbid obesity ([place]) 08/23/2018   Obstructive sleep apnea 08/23/2018   Intervertebral disc disorder with radiculopathy of lumbar region 08/23/2018   CML (chronic myelocytic leukemia) ([place]) 08/23/2018   Hx of myocardial perfusion scan 08/23/2018   Osteoarthritis 08/23/2018   Degenerative arthritis of hip 08/23/2018   Lumbar spondylosis 08/23/2018   SOB (shortness of breath) 08/23/2018   History  of CVA (cerebrovascular accident) 08/23/2018   Essential hypertension 08/23/2018   Chronic right shoulder pain 08/23/2018   Complete tear of right rotator cuff 08/23/2018   Acute pain of left shoulder 08/23/2018   On antineoplastic chemotherapy 08/23/2018   Hyperlipidemia 08/23/2018   Neurogenic claudication due to lumbar spinal stenosis 08/23/2018   Vitamin D deficiency 08/23/2018   Ruben Im, PT 11/04/20 7:58 PM Phone: [phone] Fax: [phone] Alvera Singh 11/04/2020, 7:57 PM  [facility] [address] [place], [place], [address] Phone: [phone]   Fax:  [phone]  Name: Glenda Spelman MRN: [id number] Date of Birth: 1941-02-13

## 2020-11-10 ENCOUNTER — Other Ambulatory Visit: Payer: Self-pay

## 2020-11-10 ENCOUNTER — Ambulatory Visit: Payer: Medicare HMO | Admitting: Physical Therapy

## 2020-11-10 ENCOUNTER — Encounter: Payer: Self-pay | Admitting: Physical Therapy

## 2020-11-10 DIAGNOSIS — M25611 Stiffness of right shoulder, not elsewhere classified: Secondary | ICD-10-CM

## 2020-11-10 DIAGNOSIS — M6281 Muscle weakness (generalized): Secondary | ICD-10-CM

## 2020-11-10 DIAGNOSIS — R293 Abnormal posture: Secondary | ICD-10-CM

## 2020-11-10 DIAGNOSIS — M25511 Pain in right shoulder: Secondary | ICD-10-CM | POA: Diagnosis not present

## 2020-11-10 DIAGNOSIS — G8929 Other chronic pain: Secondary | ICD-10-CM

## 2020-11-10 NOTE — Therapy (Signed)
[facility] [address] [place], [place], [address] Phone: [phone]   Fax:  [phone]  Physical Therapy Treatment  Patient Details  Name: Pamela Cox MRN: [id number] Date of Birth: Feb 19, 1941 Referring Provider (PT): Ervin Knack [place], [place]   Encounter Date: 11/10/2020   PT End of Session - 11/10/20 0753    Visit Number 6    Date for PT Re-Evaluation 01/07/21    Authorization Type [employer] Medicare    Progress Note Due on Visit 10    PT Start Time 0756    PT Stop Time 0848    PT Time Calculation (min) 52 min    Equipment Utilized During Treatment Gait belt    Activity Tolerance Patient tolerated treatment well    Behavior During Therapy [facility] for tasks assessed/performed           Past Medical History:  Diagnosis Date  . Arthritis   . CML (chronic myelocytic leukemia) ([place])   . CVA (cerebral vascular accident) ([place])   . Diabetes mellitus without complication ([place])   . GERD (gastroesophageal reflux disease)   . Hypertension   . Myelocytic leukemia, chronic ([place])   . Obesity   . Sleep apnea     Past Surgical History:  Procedure Laterality Date  . APPENDECTOMY    . COLONOSCOPY    . COLONOSCOPY WITH PROPOFOL N/A 07/16/2019   Procedure: COLONOSCOPY WITH PROPOFOL;  Surgeon: Lollie Sails, MD;  Location: [facility] ENDOSCOPY;  Service: Endoscopy;  Laterality: N/A;  . ESOPHAGOGASTRODUODENOSCOPY (EGD) WITH PROPOFOL N/A 07/16/2019   Procedure: ESOPHAGOGASTRODUODENOSCOPY (EGD) WITH PROPOFOL;  Surgeon: Lollie Sails, MD;  Location: [facility] ENDOSCOPY;  Service: Endoscopy;  Laterality: N/A;  . TUBAL LIGATION      There were no vitals filed for this visit.   Subjective Assessment - 11/10/20 0757    Subjective Pt arrives tearful and feeling like a burden on her family/caregivers.  She is afraid to use her arm and needs help with her exercises.  I don't know why it is so hard for me to get up and down and get  around.    Pertinent History gentle functional IR behind back, NO UBE AT ANY PHASE PER PROTOCOL, Pt cleared to remove sling for ADLs, wear sling at night and in community    Limitations Lifting;House hold activities;Other (comment)    Patient Stated Goals laundry, dishes, cooking    Currently in Pain? Yes    Pain Score 1     Pain Location Shoulder    Pain Orientation Right    Pain Type Surgical pain    Aggravating Factors  with movement                             [facility] Adult PT Treatment/Exercise - 11/10/20 0001      Transfers   Comments mod assist x 2      Ambulation/Gait   Gait Comments CGA x 40 feet 2x with gait belt      Therapeutic Activites    Therapeutic Activities Other Therapeutic Activities    Other Therapeutic Activities standing weight shifts square and stagger stance with mod assist x 1' each way      Exercises   Exercises Shoulder;Lumbar;Elbow      Shoulder Exercises: Supine   Protraction AAROM;Right;20 reps    Protraction Limitations PT manual facil of scapular movement    External Rotation PROM;15 reps;Right  Internal Rotation PROM;Right;15 reps    Flexion AAROM;Right;AROM;20 reps    Flexion Limitations AA/ROM with dowel x 20, then 1x10 each bent arm elevation to ear 10x; to right side of head 10x      Shoulder Exercises: Seated   Internal Rotation AROM;10 reps    Internal Rotation Limitations functional IR hand slides along lateral Rt thigh to buttock x 10    Flexion AROM;Right    Flexion Limitations low high fives to PT's hand flexion and scaption 1x5 each, PT cued avoid hiking scapula    Other Seated Exercises --    Other Seated Exercises scapular retraction yellow band bil 10 reps, PT cued don't lean back, get Rt scap involved more      Shoulder Exercises: Sidelying   Flexion AROM;15 reps    ABduction AROM;20 reps    Other Sidelying Exercises scapular retraction with PT providing manual resistance 10x5 sec holds                     PT Short Term Goals - 11/04/20 1955      PT SHORT TERM GOAL #1   Title Pt will be ind with initial HEP following protocol for reverse TSA with pain not to exceed 3/10.    Status Achieved      PT SHORT TERM GOAL #2   Title Pt will report pain level </= 3/10 with incorporation of Rt UE for basic ADLs.    Status Achieved      PT SHORT TERM GOAL #3   Title Pt will achieve AA/ROM or A/ROM to at least 110 deg elevation and 30 deg ER.    Status Achieved      PT SHORT TERM GOAL #4   Title Pt will demo submax isometric of all heads of deltoid without pain.    Status Achieved             PT Long Term Goals - 10/21/20 1052      PT LONG TERM GOAL #1   Title Pt will achieve Rt shoulder A/ROM of 110+ elevation, 40 ER, and functional IR to at least L5 for improved reaching tasks and ADLs such as dressing and bathing.    Time 8    Period Weeks    Status New      PT LONG TERM GOAL #2   Title Pt will improve Rt functional shoulder strength for incorporation of Rt UE use for light household tasks such as cleaning, cooking, and laundry with 25lb lifting restriction.    Time 12    Period Weeks    Status New      PT LONG TERM GOAL #3   Title FOTO score improved from 68% limitation to 39%    Time 12    Period Weeks    Status New      PT LONG TERM GOAL #4   Title Pt will be able to return to household and short distance community ambulation with rollator with good stability of Rt UE and min/no pain in Rt shoulder    Time 12    Period Weeks    Status New                 Plan - 11/10/20 0754    Clinical Impression Statement Pt has met all STGs and is working on phase 3 of post-op protocol.  She is compliant with post-op precautions.  She is tearful today over feeling burdensome to her caregiver family members.  She continues to guard her Rt shoulder over fear of movement and injury.  PT explained healing phases and gave permision to move with focus in sitting  and supine strategies since she is in these positions most of the day.  She requires assist with all transfers, gait and ADLs which limit her mobility throughout the day, altough level of assist is decreasing.  Her mode of ambulation has been taken away due to NWB through Rt UE (used to use a rollator).  She is able to reach shoulder elevation to 125 in supine and sidelying and approx 70 in sitting.  PT encouraged her to work on low reach high-fives in flexion and scaption plane throughout the day when upright.  She tends to hike scapula but this improves with TCs and VCs.  Pt will need ongoing PT to address stiffness, range, strength of Rt shoulder with focus on phase 3 progression and functional use of Rt UE.    Comorbidities falls, non-ambulatory at this time due to weakness and non-WB through Rt UE so no rollator, CVA, CML, DM, obesity    Rehab Potential Good    PT Frequency 3x / week    PT Duration 12 weeks    PT Treatment/Interventions ADLs/Self Care Home Management;Cryotherapy;Electrical Stimulation;Moist Heat;DME Instruction;Gait training;Therapeutic exercise;Therapeutic activities;Functional mobility training;Neuromuscular re-education;Patient/family education;Manual techniques;Passive range of motion;Wheelchair mobility training    PT Next Visit Plan add yellow band retractions to HEP;  scapular PNF, progress high fives,  progress elevation and external rotation ROM;  functional internal rotation behind back;  increased standing time.    PT Home Exercise Plan Access Code: [email or web address]    Consulted and Agree with Plan of Care Patient           Patient will benefit from skilled therapeutic intervention in order to improve the following deficits and impairments:     Visit Diagnosis: Chronic right shoulder pain  Muscle weakness (generalized)  Abnormal posture  Stiffness of right shoulder, not elsewhere classified     Problem List Patient Active Problem List   Diagnosis Date Noted  .  Allergic rhinitis due to pollen 08/23/2018  . DDD (degenerative disc disease), lumbar 08/23/2018  . Lumbar neuritis 08/23/2018  . 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  . GERD (gastroesophageal reflux disease) 08/23/2018  . Morbid obesity ([place]) 08/23/2018  . Obstructive sleep apnea 08/23/2018  . Intervertebral disc disorder with radiculopathy of lumbar region 08/23/2018  . CML (chronic myelocytic leukemia) ([place]) 08/23/2018  . Hx of myocardial perfusion scan 08/23/2018  . Osteoarthritis 08/23/2018  . Degenerative arthritis of hip 08/23/2018  . Lumbar spondylosis 08/23/2018  . SOB (shortness of breath) 08/23/2018  . History of CVA (cerebrovascular accident) 08/23/2018  . Essential hypertension 08/23/2018  . Chronic right shoulder pain 08/23/2018  . Complete tear of right rotator cuff 08/23/2018  . Acute pain of left shoulder 08/23/2018  . On antineoplastic chemotherapy 08/23/2018  . Hyperlipidemia 08/23/2018  . Neurogenic claudication due to lumbar spinal stenosis 08/23/2018  . Vitamin D deficiency 08/23/2018    Baruch Merl, PT 11/10/20 8:58 AM   [facility] [address], [place], [place], [address] Phone: [phone]   Fax:  [phone]  Name: Pamela Cox MRN: [id number] Date of Birth: 1941/02/07

## 2020-11-12 ENCOUNTER — Encounter: Payer: Self-pay | Admitting: Physical Therapy

## 2020-11-12 ENCOUNTER — Other Ambulatory Visit: Payer: Self-pay

## 2020-11-12 ENCOUNTER — Ambulatory Visit: Payer: Medicare HMO | Admitting: Physical Therapy

## 2020-11-12 DIAGNOSIS — M25611 Stiffness of right shoulder, not elsewhere classified: Secondary | ICD-10-CM

## 2020-11-12 DIAGNOSIS — M25511 Pain in right shoulder: Secondary | ICD-10-CM | POA: Diagnosis not present

## 2020-11-12 DIAGNOSIS — M6281 Muscle weakness (generalized): Secondary | ICD-10-CM

## 2020-11-12 DIAGNOSIS — G8929 Other chronic pain: Secondary | ICD-10-CM

## 2020-11-12 DIAGNOSIS — R293 Abnormal posture: Secondary | ICD-10-CM

## 2020-11-12 NOTE — Therapy (Signed)
[facility] [address], [place], [place], [address] Phone: [phone]   Fax:  [phone]  Physical Therapy Treatment  Patient Details  Name: Pamela Cox MRN: [id number] Date of Birth: [birth year] Referring Provider (PT): Ervin Knack Ewing, PA   Encounter Date: 11/12/2020   PT End of Session - 11/12/20 0834    Visit Number 7    Date for PT Re-Evaluation 01/07/21    Authorization Type [employer] Medicare    Progress Note Due on Visit 10    PT Start Time 0835    PT Stop Time 0925    PT Time Calculation (min) 50 min    Equipment Utilized During Treatment Gait belt    Activity Tolerance Patient tolerated treatment well    Behavior During Therapy [facility] for tasks assessed/performed           Past Medical History:  Diagnosis Date  . Arthritis   . CML (chronic myelocytic leukemia) ([place])   . CVA (cerebral vascular accident) ([place])   . Diabetes mellitus without complication ([place])   . GERD (gastroesophageal reflux disease)   . Hypertension   . Myelocytic leukemia, chronic ([place])   . Obesity   . Sleep apnea     Past Surgical History:  Procedure Laterality Date  . APPENDECTOMY    . COLONOSCOPY    . COLONOSCOPY WITH PROPOFOL N/A 07/16/2019   Procedure: COLONOSCOPY WITH PROPOFOL;  Surgeon: Lollie Sails, MD;  Location: [facility] ENDOSCOPY;  Service: Endoscopy;  Laterality: N/A;  . ESOPHAGOGASTRODUODENOSCOPY (EGD) WITH PROPOFOL N/A 07/16/2019   Procedure: ESOPHAGOGASTRODUODENOSCOPY (EGD) WITH PROPOFOL;  Surgeon: Lollie Sails, MD;  Location: [facility] ENDOSCOPY;  Service: Endoscopy;  Laterality: N/A;  . TUBAL LIGATION      There were no vitals filed for this visit.   Subjective Assessment - 11/12/20 0835    Subjective Pt and her daughter inquired about appropriate assistive device (hemiwalker).  Pt states she has been doing more HEP.    Pertinent History gentle functional IR behind back, NO UBE AT ANY PHASE PER  PROTOCOL, Pt cleared to remove sling for ADLs, wear sling at night and in community    Patient Stated Goals laundry, dishes, cooking    Currently in Pain? Yes    Pain Score 3     Pain Location Shoulder    Pain Orientation Right    Pain Descriptors / Indicators Sore    Pain Type Surgical pain                             [facility] Adult PT Treatment/Exercise - 11/12/20 0001      Ambulation/Gait   Gait Comments CGA x 40 feet 2x with gait belt      Exercises   Exercises Shoulder      Shoulder Exercises: Supine   Protraction AAROM;Both;15 reps    Protraction Limitations dowel    External Rotation AAROM;Right;20 reps;AROM    External Rotation Limitations dowel AA/ROM, bent elbow x 10 each: touch ear, touch side of head    Flexion AAROM;Both;15 reps    Flexion Limitations dowel      Shoulder Exercises: Seated   Row Strengthening;Both;15 reps;Theraband    Theraband Level (Shoulder Row) Level 1 (Yellow)    Row Limitations retraction small row    Flexion AAROM;AROM;Right;10 reps    Flexion Limitations table slides 10x3 sec, low high fives to PT's hand with elevation assist x 10  Shoulder Exercises: ROM/Strengthening   Ranger flexion and circles both ways Rt, 1' each      Manual Therapy   Manual Therapy Soft tissue mobilization    Soft tissue mobilization Rt upper trap, pecs, periscapular, scar massage                    PT Short Term Goals - 11/04/20 1955      PT SHORT TERM GOAL #1   Title Pt will be ind with initial HEP following protocol for reverse TSA with pain not to exceed 3/10.    Status Achieved      PT SHORT TERM GOAL #2   Title Pt will report pain level </= 3/10 with incorporation of Rt UE for basic ADLs.    Status Achieved      PT SHORT TERM GOAL #3   Title Pt will achieve AA/ROM or A/ROM to at least 110 deg elevation and 30 deg ER.    Status Achieved      PT SHORT TERM GOAL #4   Title Pt will demo submax isometric of all heads of  deltoid without pain.    Status Achieved             PT Long Term Goals - 10/21/20 1052      PT LONG TERM GOAL #1   Title Pt will achieve Rt shoulder A/ROM of 110+ elevation, 40 ER, and functional IR to at least L5 for improved reaching tasks and ADLs such as dressing and bathing.    Time 8    Period Weeks    Status New      PT LONG TERM GOAL #2   Title Pt will improve Rt functional shoulder strength for incorporation of Rt UE use for light household tasks such as cleaning, cooking, and laundry with 25lb lifting restriction.    Time 12    Period Weeks    Status New      PT LONG TERM GOAL #3   Title FOTO score improved from 68% limitation to 39%    Time 12    Period Weeks    Status New      PT LONG TERM GOAL #4   Title Pt will be able to return to household and short distance community ambulation with rollator with good stability of Rt UE and min/no pain in Rt shoulder    Time 12    Period Weeks    Status New                 Plan - 11/12/20 0263    Clinical Impression Statement Pt is showing improved awareness of dropping scapula to avoid hiking with AA/ROM.  PT progressed HEP to include more range with dowel with good return of demo by Pt.  She has approx 100 deg of elevation in supine.  Mild soreness reported with AA/ROM today.  She has improving global strength for transfers and gait, needing only mod assist x 1 with gait belt.  Info given to Pt's daughter for hemiwalker to inquire for Rx with MD.  Continue along POC.    Comorbidities falls, non-ambulatory at this time due to weakness and non-WB through Rt UE so no rollator, CVA, CML, DM, obesity    PT Frequency 3x / week    PT Duration 12 weeks    PT Treatment/Interventions ADLs/Self Care Home Management;Cryotherapy;Electrical Stimulation;Moist Heat;DME Instruction;Gait training;Therapeutic exercise;Therapeutic activities;Functional mobility training;Neuromuscular re-education;Patient/family education;Manual  techniques;Passive range of motion;Wheelchair mobility training  PT Next Visit Plan f/u on hemiwalker access, dowel HEP, add yellow band retractions to HEP;  scapular PNF, progress high fives,  progress elevation and external rotation ROM;  functional internal rotation behind back;  increased standing time.    PT Home Exercise Plan Access Code: [email or web address]    Consulted and Agree with Plan of Care Patient           Patient will benefit from skilled therapeutic intervention in order to improve the following deficits and impairments:     Visit Diagnosis: Chronic right shoulder pain  Muscle weakness (generalized)  Abnormal posture  Stiffness of right shoulder, not elsewhere classified     Problem List Patient Active Problem List   Diagnosis Date Noted  . Allergic rhinitis due to pollen 08/23/2018  . DDD (degenerative disc disease), lumbar 08/23/2018  . Lumbar neuritis 08/23/2018  . 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  . GERD (gastroesophageal reflux disease) 08/23/2018  . Morbid obesity (HCC) 08/23/2018  . Obstructive sleep apnea 08/23/2018  . Intervertebral disc disorder with radiculopathy of lumbar region 08/23/2018  . CML (chronic myelocytic leukemia) (HCC) 08/23/2018  . Hx of myocardial perfusion scan 08/23/2018  . Osteoarthritis 08/23/2018  . Degenerative arthritis of hip 08/23/2018  . Lumbar spondylosis 08/23/2018  . SOB (shortness of breath) 08/23/2018  . History of CVA (cerebrovascular accident) 08/23/2018  . Essential hypertension 08/23/2018  . Chronic right shoulder pain 08/23/2018  . Complete tear of right rotator cuff 08/23/2018  . Acute pain of left shoulder 08/23/2018  . On antineoplastic chemotherapy 08/23/2018  . Hyperlipidemia 08/23/2018  . Neurogenic claudication due to lumbar spinal stenosis 08/23/2018  . Vitamin D deficiency 08/23/2018   Morton Peters, PT 11/12/20 9:31 AM     [facility] [address] [place], [place], [address] Phone: [phone]   Fax:  [phone]  Name: Pamela Cox MRN: [id number] Date of Birth: 1941-09-01

## 2020-11-17 ENCOUNTER — Encounter: Payer: Self-pay | Admitting: Physical Therapy

## 2020-11-17 ENCOUNTER — Ambulatory Visit: Payer: Medicare HMO | Admitting: Physical Therapy

## 2020-11-17 ENCOUNTER — Other Ambulatory Visit: Payer: Self-pay

## 2020-11-17 DIAGNOSIS — M25611 Stiffness of right shoulder, not elsewhere classified: Secondary | ICD-10-CM

## 2020-11-17 DIAGNOSIS — M25511 Pain in right shoulder: Secondary | ICD-10-CM | POA: Diagnosis not present

## 2020-11-17 DIAGNOSIS — M6281 Muscle weakness (generalized): Secondary | ICD-10-CM

## 2020-11-17 DIAGNOSIS — R293 Abnormal posture: Secondary | ICD-10-CM

## 2020-11-17 DIAGNOSIS — G8929 Other chronic pain: Secondary | ICD-10-CM

## 2020-11-17 NOTE — Therapy (Signed)
[facility] [address] [place], [place], [address] Phone: [phone]   Fax:  [phone]  Physical Therapy Treatment  Patient Details  Name: Pamela Cox MRN: [id number] Date of Birth: [birth year] Referring Provider (PT): Ervin Knack Ewing, PA   Encounter Date: 11/17/2020   PT End of Session - 11/17/20 0931    Visit Number 8    Date for PT Re-Evaluation 01/07/21    Authorization Type [employer] Medicare    Progress Note Due on Visit 10    PT Start Time 0845    PT Stop Time 0930    PT Time Calculation (min) 45 min    Activity Tolerance Patient tolerated treatment well    Behavior During Therapy [facility] for tasks assessed/performed           Past Medical History:  Diagnosis Date  . Arthritis   . CML (chronic myelocytic leukemia) ([place])   . CVA (cerebral vascular accident) ([place])   . Diabetes mellitus without complication ([place])   . GERD (gastroesophageal reflux disease)   . Hypertension   . Myelocytic leukemia, chronic ([place])   . Obesity   . Sleep apnea     Past Surgical History:  Procedure Laterality Date  . APPENDECTOMY    . COLONOSCOPY    . COLONOSCOPY WITH PROPOFOL N/A 07/16/2019   Procedure: COLONOSCOPY WITH PROPOFOL;  Surgeon: Lollie Sails, MD;  Location: [facility] ENDOSCOPY;  Service: Endoscopy;  Laterality: N/A;  . ESOPHAGOGASTRODUODENOSCOPY (EGD) WITH PROPOFOL N/A 07/16/2019   Procedure: ESOPHAGOGASTRODUODENOSCOPY (EGD) WITH PROPOFOL;  Surgeon: Lollie Sails, MD;  Location: [facility] ENDOSCOPY;  Service: Endoscopy;  Laterality: N/A;  . TUBAL LIGATION      There were no vitals filed for this visit.   Subjective Assessment - 11/17/20 0847    Subjective I got the hemiwalker but don't know if we will keep it if we have to pay for it.  I couldn't find a cane for the dowel exercises.  I am trying to do my HEP as best I can.  I don't use the Rt arm to eat or brush teeth.    Pertinent History gentle  functional IR behind back, NO UBE AT ANY PHASE PER PROTOCOL, Pt cleared to remove sling for ADLs, wear sling at night and in community    Limitations Lifting;House hold activities;Other (comment)    Patient Stated Goals laundry, dishes, cooking    Currently in Pain? No/denies              [facility] PT Assessment - 11/17/20 0001      ROM / Strength   AROM / PROM / Strength AROM      AROM   AROM Assessment Site Shoulder    Right/Left Shoulder Right    Right Shoulder Flexion 125 Degrees   AA/ROM with pulleys                        [facility] Adult PT Treatment/Exercise - 11/17/20 0001      Exercises   Exercises Shoulder      Shoulder Exercises: Seated   Other Seated Exercises Rt UE ranger circles at side x 10 each way, then flexion and scaption, then larger circles both ways x 10      Shoulder Exercises: Standing   Other Standing Exercises finger ladder Rt UE 3x to L12      Shoulder Exercises: Pulleys   Scaption 3 minutes;1 minute    Scaption Limitations  2 sets with PT manual facil of scapular motion      Manual Therapy   Manual Therapy Soft tissue mobilization;Passive ROM    Soft tissue mobilization Rt upper trap adn periscapular, pecs seated    Passive ROM scapular PNF for mobility of scapula into retraction/depression, ER in scaption seated                    PT Short Term Goals - 11/04/20 1955      PT SHORT TERM GOAL #1   Title Pt will be ind with initial HEP following protocol for reverse TSA with pain not to exceed 3/10.    Status Achieved      PT SHORT TERM GOAL #2   Title Pt will report pain level </= 3/10 with incorporation of Rt UE for basic ADLs.    Status Achieved      PT SHORT TERM GOAL #3   Title Pt will achieve AA/ROM or A/ROM to at least 110 deg elevation and 30 deg ER.    Status Achieved      PT SHORT TERM GOAL #4   Title Pt will demo submax isometric of all heads of deltoid without pain.    Status Achieved             PT  Long Term Goals - 10/21/20 1052      PT LONG TERM GOAL #1   Title Pt will achieve Rt shoulder A/ROM of 110+ elevation, 40 ER, and functional IR to at least L5 for improved reaching tasks and ADLs such as dressing and bathing.    Time 8    Period Weeks    Status New      PT LONG TERM GOAL #2   Title Pt will improve Rt functional shoulder strength for incorporation of Rt UE use for light household tasks such as cleaning, cooking, and laundry with 25lb lifting restriction.    Time 12    Period Weeks    Status New      PT LONG TERM GOAL #3   Title FOTO score improved from 68% limitation to 39%    Time 12    Period Weeks    Status New      PT LONG TERM GOAL #4   Title Pt will be able to return to household and short distance community ambulation with rollator with good stability of Rt UE and min/no pain in Rt shoulder    Time 12    Period Weeks    Status New                 Plan - 11/17/20 1237    Clinical Impression Statement Pt continues to have difficulty with transfer of ther ex into HEP.  Shoulder was very stiff today especially into ER in scapular plane.  Pt hasn't been able to find a cane to do AA/ROM and continues to use Lt hand for ADLs.  PT spoke to Pt's daughter about trying to find something to help Pt do HEP with more success, and also recommended over the door shoulder pulleys as these were well tolerated today.  PT encouraged Pt to incorporate Rt UE into ADLs like eating and brushing teeth.  Pt was able to reach Rt UE elevation to 125 deg AA/ROM using pulleys today.  PT used manual mobs and facilitation of scapular and GH mechanics with pulleys to improve scapular mobility.  Pt reached level 13 on finger ladder today.  PT gives  ongoing cues to avoid scapular hiking.  Pt with good pain control and improving safety with mod assist transfers and gait.  She is working on getting a Patent examiner.  Continue along POC with ongoing focus on P/ROM and AA/ROM for elevation, ER and  functional IR.    Comorbidities falls, CVA, CML, DM, obesity    PT Frequency 3x / week    PT Duration 12 weeks    PT Treatment/Interventions ADLs/Self Care Home Management;Cryotherapy;Electrical Stimulation;Moist Heat;DME Instruction;Gait training;Therapeutic exercise;Therapeutic activities;Functional mobility training;Neuromuscular re-education;Patient/family education;Manual techniques;Passive range of motion;Wheelchair mobility training    PT Next Visit Plan continue P/ROM and AA/ROM for Rt shoulder elevation and ER, row, functional IR, pulleys, ranger seated (raise height), finger ladder    PT Home Exercise Plan Access Code: [email or web address] and Agree with Plan of Care Patient           Patient will benefit from skilled therapeutic intervention in order to improve the following deficits and impairments:     Visit Diagnosis: Chronic right shoulder pain  Muscle weakness (generalized)  Abnormal posture  Stiffness of right shoulder, not elsewhere classified     Problem List Patient Active Problem List   Diagnosis Date Noted  . Allergic rhinitis due to pollen 08/23/2018  . DDD (degenerative disc disease), lumbar 08/23/2018  . Lumbar neuritis 08/23/2018  . 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  . GERD (gastroesophageal reflux disease) 08/23/2018  . Morbid obesity ([place]) 08/23/2018  . Obstructive sleep apnea 08/23/2018  . Intervertebral disc disorder with radiculopathy of lumbar region 08/23/2018  . CML (chronic myelocytic leukemia) ([place]) 08/23/2018  . Hx of myocardial perfusion scan 08/23/2018  . Osteoarthritis 08/23/2018  . Degenerative arthritis of hip 08/23/2018  . Lumbar spondylosis 08/23/2018  . SOB (shortness of breath) 08/23/2018  . History of CVA (cerebrovascular accident) 08/23/2018  . Essential hypertension 08/23/2018  . Chronic right shoulder pain 08/23/2018  . Complete tear of right rotator cuff 08/23/2018  . Acute pain of left shoulder 08/23/2018  . On  antineoplastic chemotherapy 08/23/2018  . Hyperlipidemia 08/23/2018  . Neurogenic claudication due to lumbar spinal stenosis 08/23/2018  . Vitamin D deficiency 08/23/2018    Baruch Merl, PT 11/17/20 12:44 PM     [facility] [address] [place], [place], [address] Phone: [phone]   Fax:  [phone]  Name: Pamela Cox MRN: [id number] Date of Birth: [birth year]

## 2020-11-19 ENCOUNTER — Ambulatory Visit: Payer: Medicare HMO | Admitting: Physical Therapy

## 2020-11-19 ENCOUNTER — Other Ambulatory Visit: Payer: Self-pay

## 2020-11-19 ENCOUNTER — Encounter: Payer: Self-pay | Admitting: Physical Therapy

## 2020-11-19 DIAGNOSIS — M25611 Stiffness of right shoulder, not elsewhere classified: Secondary | ICD-10-CM

## 2020-11-19 DIAGNOSIS — R293 Abnormal posture: Secondary | ICD-10-CM

## 2020-11-19 DIAGNOSIS — M25511 Pain in right shoulder: Secondary | ICD-10-CM | POA: Diagnosis not present

## 2020-11-19 DIAGNOSIS — M6281 Muscle weakness (generalized): Secondary | ICD-10-CM

## 2020-11-19 DIAGNOSIS — G8929 Other chronic pain: Secondary | ICD-10-CM

## 2020-11-19 NOTE — Therapy (Signed)
[facility] [address], [address] [place], [place], [address] Phone: [phone]   Fax:  [phone]  Physical Therapy Treatment  Patient Details  Name: Pamela Cox MRN: [id number] Date of Birth: 08-Mar-1941 Referring Provider (PT): Reinaldo Berber Ewing, PA   Encounter Date: 11/19/2020   PT End of Session - 11/19/20 0844    Visit Number 9    Date for PT Re-Evaluation 01/07/21    Authorization Type [employer]etna Medicare    Progress Note Due on Visit 10    PT Start Time 0845    PT Stop Time 0930    PT Time Calculation (min) 45 min    Equipment Utilized During Treatment Gait belt    Activity Tolerance Patient tolerated treatment well    Behavior During Therapy [facility] for tasks assessed/performed           Past Medical History:  Diagnosis Date  . Arthritis   . CML (chronic myelocytic leukemia) (HCC)   . CVA (cerebral vascular accident) (HCC)   . Diabetes mellitus without complication (HCC)   . GERD (gastroesophageal reflux disease)   . Hypertension   . Myelocytic leukemia, chronic (HCC)   . Obesity   . Sleep apnea     Past Surgical History:  Procedure Laterality Date  . APPENDECTOMY    . COLONOSCOPY    . COLONOSCOPY WITH PROPOFOL N/A 07/16/2019   Procedure: COLONOSCOPY WITH PROPOFOL;  Surgeon: Christena Deem, MD;  Location: [facility] ENDOSCOPY;  Service: Endoscopy;  Laterality: N/A;  . ESOPHAGOGASTRODUODENOSCOPY (EGD) WITH PROPOFOL N/A 07/16/2019   Procedure: ESOPHAGOGASTRODUODENOSCOPY (EGD) WITH PROPOFOL;  Surgeon: Christena Deem, MD;  Location: [facility] ENDOSCOPY;  Service: Endoscopy;  Laterality: N/A;  . TUBAL LIGATION      There were no vitals filed for this visit.   Subjective Assessment - 11/19/20 0844    Subjective Pt arrives with hemiwalker.  No shoulder pain.  Doing more with Rt hand and arm at home, carefully, like you asked me to.    Pertinent History gentle functional IR behind back, NO UBE AT ANY PHASE PER  PROTOCOL, Pt cleared to remove sling for ADLs, wear sling at night and in community    Limitations Lifting;House hold activities;Other (comment)    Patient Stated Goals laundry, dishes, cooking    Currently in Pain? No/denies                             [facility] Adult PT Treatment/Exercise - 11/19/20 0001      Ambulation/Gait   Gait Comments gait training with hemiwalker on 3-step count: walker, Rt LE, Lt LE x 80'      Exercises   Exercises Shoulder      Shoulder Exercises: Supine   Protraction AAROM;Left;15 reps    External Rotation AAROM;Limitations    External Rotation Limitations dowel x 2' in scaption    Flexion AAROM;Left;15 reps    Flexion Limitations using Lt UE to assist, then dowel x 1'    Other Supine Exercises scapular retraction with PT supporting Lt UE at 90 deg      Shoulder Exercises: Pulleys   Flexion 5 minutes    Flexion Limitations 3 min AA/ROM, then add pause each rep x 5 sec for elevation stretch x 2'      Manual Therapy   Manual Therapy Soft tissue mobilization;Joint mobilization;Passive ROM    Joint Mobilization posterior glides, inferior glides Gr I-II for muscle guarding  inhibition    Passive ROM scapular mobilization, Lt shoulder ER, flexion, scaption                    PT Short Term Goals - 11/04/20 1955      PT SHORT TERM GOAL #1   Title Pt will be ind with initial HEP following protocol for reverse TSA with pain not to exceed 3/10.    Status Achieved      PT SHORT TERM GOAL #2   Title Pt will report pain level </= 3/10 with incorporation of Rt UE for basic ADLs.    Status Achieved      PT SHORT TERM GOAL #3   Title Pt will achieve AA/ROM or A/ROM to at least 110 deg elevation and 30 deg ER.    Status Achieved      PT SHORT TERM GOAL #4   Title Pt will demo submax isometric of all heads of deltoid without pain.    Status Achieved             PT Long Term Goals - 10/21/20 1052      PT LONG TERM GOAL #1    Title Pt will achieve Rt shoulder A/ROM of 110+ elevation, 40 ER, and functional IR to at least L5 for improved reaching tasks and ADLs such as dressing and bathing.    Time 8    Period Weeks    Status New      PT LONG TERM GOAL #2   Title Pt will improve Rt functional shoulder strength for incorporation of Rt UE use for light household tasks such as cleaning, cooking, and laundry with 25lb lifting restriction.    Time 12    Period Weeks    Status New      PT LONG TERM GOAL #3   Title FOTO score improved from 68% limitation to 39%    Time 12    Period Weeks    Status New      PT LONG TERM GOAL #4   Title Pt will be able to return to household and short distance community ambulation with rollator with good stability of Rt UE and min/no pain in Rt shoulder    Time 12    Period Weeks    Status New                 Plan - 11/19/20 0845    Clinical Impression Statement Pt arrived with hemiwalker.  PT raises it and worked on 3-count gait pattern x 80 feet to reinforce safe use of AD.  Pt needed more assist with sit to stand today but from lower surface than usual.  PT focused on manual techniques for P/ROM which helped quality of scapular mechanics and range with AA/ROM today.  ER improved to 20 deg in scaption end of session from 0 deg.  Continue along POC for improved ROM and functional use, with focus on scapular mechanics.  10th visit PN next time.    Comorbidities falls, CVA, CML, DM, obesity    PT Frequency 3x / week    PT Duration 12 weeks    PT Treatment/Interventions ADLs/Self Care Home Management;Cryotherapy;Electrical Stimulation;Moist Heat;DME Instruction;Gait training;Therapeutic exercise;Therapeutic activities;Functional mobility training;Neuromuscular re-education;Patient/family education;Manual techniques;Passive range of motion;Wheelchair mobility training    PT Next Visit Plan 10th visit PN next time    PT Home Exercise Plan Access Code: [email or web address]    Consulted and  Agree with Plan of Care Patient  Patient will benefit from skilled therapeutic intervention in order to improve the following deficits and impairments:     Visit Diagnosis: Chronic right shoulder pain  Muscle weakness (generalized)  Abnormal posture  Stiffness of right shoulder, not elsewhere classified     Problem List Patient Active Problem List   Diagnosis Date Noted  . Allergic rhinitis due to pollen 08/23/2018  . DDD (degenerative disc disease), lumbar 08/23/2018  . Lumbar neuritis 08/23/2018  . 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  . GERD (gastroesophageal reflux disease) 08/23/2018  . Morbid obesity (HCC) 08/23/2018  . Obstructive sleep apnea 08/23/2018  . Intervertebral disc disorder with radiculopathy of lumbar region 08/23/2018  . CML (chronic myelocytic leukemia) (HCC) 08/23/2018  . Hx of myocardial perfusion scan 08/23/2018  . Osteoarthritis 08/23/2018  . Degenerative arthritis of hip 08/23/2018  . Lumbar spondylosis 08/23/2018  . SOB (shortness of breath) 08/23/2018  . History of CVA (cerebrovascular accident) 08/23/2018  . Essential hypertension 08/23/2018  . Chronic right shoulder pain 08/23/2018  . Complete tear of right rotator cuff 08/23/2018  . Acute pain of left shoulder 08/23/2018  . On antineoplastic chemotherapy 08/23/2018  . Hyperlipidemia 08/23/2018  . Neurogenic claudication due to lumbar spinal stenosis 08/23/2018  . Vitamin D deficiency 08/23/2018    Morton Peters, PT 11/19/20 9:33 AM    [facility] [address], [address] [place], [place], [address] Phone: [phone]   Fax:  [phone]  Name: Pamela Cox MRN: [id number] Date of Birth: October 11, 1941

## 2020-11-24 ENCOUNTER — Ambulatory Visit: Payer: Medicare HMO | Admitting: Physical Therapy

## 2020-11-26 ENCOUNTER — Ambulatory Visit: Payer: Medicare HMO | Attending: Physician Assistant | Admitting: Physical Therapy

## 2020-11-26 ENCOUNTER — Other Ambulatory Visit: Payer: Self-pay

## 2020-11-26 ENCOUNTER — Encounter: Payer: Self-pay | Admitting: Physical Therapy

## 2020-11-26 DIAGNOSIS — M25611 Stiffness of right shoulder, not elsewhere classified: Secondary | ICD-10-CM | POA: Diagnosis present

## 2020-11-26 DIAGNOSIS — M6281 Muscle weakness (generalized): Secondary | ICD-10-CM | POA: Insufficient documentation

## 2020-11-26 DIAGNOSIS — M25511 Pain in right shoulder: Secondary | ICD-10-CM | POA: Diagnosis present

## 2020-11-26 DIAGNOSIS — G8929 Other chronic pain: Secondary | ICD-10-CM | POA: Diagnosis present

## 2020-11-26 DIAGNOSIS — R293 Abnormal posture: Secondary | ICD-10-CM | POA: Diagnosis present

## 2020-11-26 NOTE — Therapy (Signed)
[facility] [address], [place], [place], [address] Phone: [phone]   Fax:  [phone]  Physical Therapy Treatment  Patient Details  Name: Pamela Cox MRN: [id number] Date of Birth: 12-15-1940 Referring Provider (PT): Ervin Knack Ewing, PA  Progress Note Reporting Period 10/15/20 to 11/26/20  See note below for Objective Data and Assessment of Progress/Goals.       Encounter Date: 11/26/2020   PT End of Session - 11/26/20 0854    Visit Number 10    Date for PT Re-Evaluation 01/07/21    Authorization Type [employer]    Progress Note Due on Visit 10    PT Start Time [phone]    PT Stop Time 0930    PT Time Calculation (min) 43 min    Equipment Utilized During Treatment Gait belt    Activity Tolerance Patient tolerated treatment well    Behavior During Therapy WFL for tasks assessed/performed           Past Medical History:  Diagnosis Date  . Arthritis   . CML (chronic myelocytic leukemia) ([place])   . CVA (cerebral vascular accident) ([place])   . Diabetes mellitus without complication ([place])   . GERD (gastroesophageal reflux disease)   . Hypertension   . Myelocytic leukemia, chronic ([place])   . Obesity   . Sleep apnea     Past Surgical History:  Procedure Laterality Date  . APPENDECTOMY    . COLONOSCOPY    . COLONOSCOPY WITH PROPOFOL N/A 07/16/2019   Procedure: COLONOSCOPY WITH PROPOFOL;  Surgeon: Lollie Sails, MD;  Location: [facility] ENDOSCOPY;  Service: Endoscopy;  Laterality: N/A;  . ESOPHAGOGASTRODUODENOSCOPY (EGD) WITH PROPOFOL N/A 07/16/2019   Procedure: ESOPHAGOGASTRODUODENOSCOPY (EGD) WITH PROPOFOL;  Surgeon: Lollie Sails, MD;  Location: [facility] ENDOSCOPY;  Service: Endoscopy;  Laterality: N/A;  . TUBAL LIGATION      There were no vitals filed for this visit.   Subjective Assessment - 11/26/20 0853    Subjective No shoulder pain, using hemiwalker but still feel unsteady with it.  I am  using my Rt arm to fold clothes, eat, brush teeth.    Pertinent History gentle functional IR behind back, NO UBE AT ANY PHASE PER PROTOCOL, Pt cleared to remove sling for ADLs, wear sling at night and in community    Limitations Lifting;House hold activities;Other (comment)    Patient Stated Goals laundry, dishes, cooking    Currently in Pain? No/denies              [facility] PT Assessment - 11/26/20 0001      Assessment   Medical Diagnosis s/p Right reverse TSA    Referring Provider (PT) Ervin Knack Ewing, PA    Onset Date/Surgical Date 09/17/20    Next MD Visit 10/27/20    Prior Therapy not for shoulder      ROM / Strength   AROM / PROM / Strength PROM      AROM   Overall AROM Comments functional IR Rt shoulder to mid-gluteal region    AROM Assessment Site Shoulder    Right/Left Shoulder Right    Right Shoulder Flexion 90 Degrees    Right Shoulder External Rotation 35 Degrees   AA/ROM     PROM   Right Shoulder Flexion 125 Degrees      Strength   Overall Strength Comments Rt scapular retraction 4-/5  [facility] Adult PT Treatment/Exercise - 11/26/20 0001      Neuro Re-ed    Neuro Re-ed Details  scapular retraction/depression facilitation with PT manual assist with active seated row x 20 reps      Exercises   Exercises Shoulder      Shoulder Exercises: Seated   External Rotation AAROM;Right;20 reps    External Rotation Limitations dowel      Shoulder Exercises: Standing   Other Standing Exercises ball on table ABCs Rt UE      Shoulder Exercises: Pulleys   Flexion 3 minutes    Scaption 3 minutes    Scaption Limitations manual assist of scapular mechanics by PT      Shoulder Exercises: ROM/Strengthening   [place] seated Rt UE circles 1' each way, seated flexion, scaption x 2' alt      Manual Therapy   Joint Mobilization posterior glides Gr II/III                    PT Short Term Goals - 11/04/20 1955      PT SHORT  TERM GOAL #1   Title Pt will be ind with initial HEP following protocol for reverse TSA with pain not to exceed 3/10.    Status Achieved      PT SHORT TERM GOAL #2   Title Pt will report pain level </= 3/10 with incorporation of Rt UE for basic ADLs.    Status Achieved      PT SHORT TERM GOAL #3   Title Pt will achieve AA/ROM or A/ROM to at least 110 deg elevation and 30 deg ER.    Status Achieved      PT SHORT TERM GOAL #4   Title Pt will demo submax isometric of all heads of deltoid without pain.    Status Achieved             PT Long Term Goals - 10/21/20 1052      PT LONG TERM GOAL #1   Title Pt will achieve Rt shoulder A/ROM of 110+ elevation, 40 ER, and functional IR to at least L5 for improved reaching tasks and ADLs such as dressing and bathing.    Time 8    Period Weeks    Status New      PT LONG TERM GOAL #2   Title Pt will improve Rt functional shoulder strength for incorporation of Rt UE use for light household tasks such as cleaning, cooking, and laundry with 25lb lifting restriction.    Time 12    Period Weeks    Status New      PT LONG TERM GOAL #3   Title FOTO score improved from 68% limitation to 39%    Time 12    Period Weeks    Status New      PT LONG TERM GOAL #4   Title Pt will be able to return to household and short distance community ambulation with rollator with good stability of Rt UE and min/no pain in Rt shoulder    Time 12    Period Weeks    Status New                 Plan - 11/26/20 0929    Clinical Impression Statement Pt with slow progress secondary to overall health and mobility.  She needs heavy TC and VCs for scapular control with Rt shoulder AA/ROM and A/ROM to avoid shoulder hiking.  Shoulder is very stiff and Pt  has difficulty with transitioning from clinic to HEP performance.  She needs min/mod assist x 1 for ADLs, gait supervision and transfers.  She is using a hemiwalker with slow gait and needs min assist x 1 for  safety.  She is able to perform Rt shoulder elevation to 90 deg, AA/ROM elevation to 125.  ER is 35 deg AA/ROM.  PT continues to encourage Pt to use Rt UE for ADLs as much as possible.  Pt has 4-/5 scapular retraction strength but needs initial faciliation for proper activation.  She continues to hike shoulder with ROM throughout session.  Continue along POC.    Stability/Clinical Decision Making Evolving/Moderate complexity    Rehab Potential Good    PT Frequency 3x / week    PT Duration 12 weeks    PT Treatment/Interventions ADLs/Self Care Home Management;Cryotherapy;Electrical Stimulation;Moist Heat;DME Instruction;Gait training;Therapeutic exercise;Therapeutic activities;Functional mobility training;Neuromuscular re-education;Patient/family education;Manual techniques;Passive range of motion;Wheelchair mobility training    PT Next Visit Plan continue AA/ROM, A/ROM, manual techniques    PT Home Exercise Plan Access Code: [email or web address]    Consulted and Agree with Plan of Care Patient           Patient will benefit from skilled therapeutic intervention in order to improve the following deficits and impairments:     Visit Diagnosis: Chronic right shoulder pain  Muscle weakness (generalized)  Abnormal posture  Stiffness of right shoulder, not elsewhere classified     Problem List Patient Active Problem List   Diagnosis Date Noted  . Allergic rhinitis due to pollen 08/23/2018  . DDD (degenerative disc disease), lumbar 08/23/2018  . Lumbar neuritis 08/23/2018  . 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  . GERD (gastroesophageal reflux disease) 08/23/2018  . Morbid obesity ([place]) 08/23/2018  . Obstructive sleep apnea 08/23/2018  . Intervertebral disc disorder with radiculopathy of lumbar region 08/23/2018  . CML (chronic myelocytic leukemia) ([place]) 08/23/2018  . Hx of myocardial perfusion scan 08/23/2018  . Osteoarthritis 08/23/2018  . Degenerative arthritis of hip 08/23/2018  . Lumbar  spondylosis 08/23/2018  . SOB (shortness of breath) 08/23/2018  . History of CVA (cerebrovascular accident) 08/23/2018  . Essential hypertension 08/23/2018  . Chronic right shoulder pain 08/23/2018  . Complete tear of right rotator cuff 08/23/2018  . Acute pain of left shoulder 08/23/2018  . On antineoplastic chemotherapy 08/23/2018  . Hyperlipidemia 08/23/2018  . Neurogenic claudication due to lumbar spinal stenosis 08/23/2018  . Vitamin D deficiency 08/23/2018    Baruch Merl, PT 11/26/20 10:59 AM   [facility] [address] [place], [place], [address] Phone: [phone]   Fax:  [phone]  Name: Pamela Cox MRN: [id number] Date of Birth: 1941-03-13

## 2020-12-01 ENCOUNTER — Encounter (HOSPITAL_COMMUNITY): Payer: Self-pay | Admitting: Emergency Medicine

## 2020-12-01 ENCOUNTER — Emergency Department (HOSPITAL_COMMUNITY): Payer: Medicare HMO

## 2020-12-01 ENCOUNTER — Emergency Department (HOSPITAL_COMMUNITY)
Admission: EM | Admit: 2020-12-01 | Discharge: 2020-12-02 | Disposition: A | Discharge status: Home / Self Care | Payer: Medicare HMO | Attending: Emergency Medicine | Admitting: Emergency Medicine

## 2020-12-01 ENCOUNTER — Encounter: Payer: Medicare HMO | Admitting: Physical Therapy

## 2020-12-01 ENCOUNTER — Other Ambulatory Visit: Payer: Self-pay

## 2020-12-01 DIAGNOSIS — Z79899 Other long term (current) drug therapy: Secondary | ICD-10-CM | POA: Insufficient documentation

## 2020-12-01 DIAGNOSIS — R112 Nausea with vomiting, unspecified: Secondary | ICD-10-CM | POA: Diagnosis present

## 2020-12-01 DIAGNOSIS — U071 COVID-19: Secondary | ICD-10-CM | POA: Insufficient documentation

## 2020-12-01 DIAGNOSIS — I10 Essential (primary) hypertension: Secondary | ICD-10-CM | POA: Insufficient documentation

## 2020-12-01 DIAGNOSIS — E119 Type 2 diabetes mellitus without complications: Secondary | ICD-10-CM | POA: Diagnosis not present

## 2020-12-01 NOTE — ED Triage Notes (Addendum)
Patient tested positive for Covid19 today reports mid abdominal pain with emesis onset this week , denies fever or chills . Occasional dry cough/respirations unlabored .

## 2020-12-02 LAB — CBC
HCT: 39.2 % (ref 36.0–46.0)
Hemoglobin: 12.5 g/dL (ref 12.0–15.0)
MCH: 31.9 pg (ref 26.0–34.0)
MCHC: 31.9 g/dL (ref 30.0–36.0)
MCV: 100 fL (ref 80.0–100.0)
Platelets: 218 10*3/uL (ref 150–400)
RBC: 3.92 MIL/uL (ref 3.87–5.11)
RDW: 14.6 % (ref 11.5–15.5)
WBC: 4.7 10*3/uL (ref 4.0–10.5)
nRBC: 0 % (ref 0.0–0.2)

## 2020-12-02 LAB — COMPREHENSIVE METABOLIC PANEL
ALT: 15 U/L (ref 0–44)
AST: 30 U/L (ref 15–41)
Albumin: 4 g/dL (ref 3.5–5.0)
Alkaline Phosphatase: 74 U/L (ref 38–126)
Anion gap: 10 (ref 5–15)
BUN: 13 mg/dL (ref 8–23)
CO2: 22 mmol/L (ref 22–32)
Calcium: 9.6 mg/dL (ref 8.9–10.3)
Chloride: 102 mmol/L (ref 98–111)
Creatinine, Ser: 0.77 mg/dL (ref 0.44–1.00)
GFR, Estimated: >60 mL/min (ref >60)
Glucose, Bld: 105 mg/dL — ABNORMAL HIGH (ref 70–99)
Potassium: 3.5 mmol/L (ref 3.5–5.1)
Sodium: 134 mmol/L — ABNORMAL LOW (ref 135–145)
Total Bilirubin: 1.1 mg/dL (ref 0.3–1.2)
Total Protein: 6.7 g/dL (ref 6.5–8.1)

## 2020-12-02 LAB — URINALYSIS, ROUTINE W REFLEX MICROSCOPIC
Bacteria, UA: NONE SEEN
Bilirubin Urine: NEGATIVE
Glucose, UA: NEGATIVE mg/dL
Ketones, ur: NEGATIVE mg/dL
Leukocytes,Ua: NEGATIVE
Nitrite: NEGATIVE
Protein, ur: >=300 mg/dL — AB
Specific Gravity, Urine: 1.023 (ref 1.005–1.030)
pH: 5 (ref 5.0–8.0)

## 2020-12-02 LAB — LIPASE, BLOOD: Lipase: 35 U/L (ref 11–51)

## 2020-12-02 MED ORDER — LABETALOL HCL 200 MG PO TABS
300.0000 mg | ORAL_TABLET | Freq: Two times a day (BID) | ORAL | Status: DC
  Administered 2020-12-02: 300 mg via ORAL
  Filled 2020-12-02: qty 2

## 2020-12-02 MED ORDER — AMLODIPINE BESYLATE 5 MG PO TABS
10.0000 mg | ORAL_TABLET | Freq: Every day | ORAL | Status: DC
  Administered 2020-12-02: 10 mg via ORAL
  Filled 2020-12-02 (×2): qty 2

## 2020-12-02 MED ORDER — ONDANSETRON 4 MG PO TBDP: 4.0000 mg | ORAL_TABLET | Freq: Three times a day (TID) | ORAL | 0 refills | Status: DC | PRN

## 2020-12-02 MED ORDER — ONDANSETRON HCL 4 MG/2ML IJ SOLN
4.0000 mg | Freq: Once | INTRAMUSCULAR | Status: AC
  Administered 2020-12-02: 4 mg via INTRAVENOUS
  Filled 2020-12-02: qty 2

## 2020-12-02 NOTE — ED Provider Notes (Signed)
[facility] EMERGENCY DEPARTMENT Provider Note   CSN: [id number] Arrival date & time: 12/01/20  2247     History Chief Complaint  Patient presents with  . Covid+/Abdominal Pain/Emesis    Pamela Cox is a 80 y.o. female.  HPI Patient is a 80 year old female with past medical history significant for CML, CVA, DM, HTN, obesity, sleep apnea arthritis  Patient is presenting today with nausea and vomiting she states that her symptoms began Friday.  She states she has also had a dry cough but states no shortness of breath.  She denies any chest pain or lightheadedness or dizziness abdominal pain or shortness of breath. She states she feels well presently. She states that she did have some abdominal pain when she was vomiting but she states that has resolved.  She denies any diarrhea. She denies any fevers or chills. No other associated symptoms. No aggravating mitigating factors. She also denies any urinary symptoms such as frequency urgency or dysuria.  Patient states that she takes all of her medications as prescribed although she has not been able to take her hypertension medicine because she has been vomiting.     Past Medical History:  Diagnosis Date  . Arthritis   . CML (chronic myelocytic leukemia) ([place])   . CVA (cerebral vascular accident) ([place])   . Diabetes mellitus without complication ([place])   . GERD (gastroesophageal reflux disease)   . Hypertension   . Myelocytic leukemia, chronic ([place])   . Obesity   . Sleep apnea     Patient Active Problem List   Diagnosis Date Noted  . Allergic rhinitis due to pollen 08/23/2018  . DDD (degenerative disc disease), lumbar 08/23/2018  . Lumbar neuritis 08/23/2018  . 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  . GERD (gastroesophageal reflux disease) 08/23/2018  . Morbid obesity ([place]) 08/23/2018  . Obstructive sleep apnea 08/23/2018  . Intervertebral disc disorder with radiculopathy of lumbar region 08/23/2018  . CML  (chronic myelocytic leukemia) ([place]) 08/23/2018  . Hx of myocardial perfusion scan 08/23/2018  . Osteoarthritis 08/23/2018  . Degenerative arthritis of hip 08/23/2018  . Lumbar spondylosis 08/23/2018  . SOB (shortness of breath) 08/23/2018  . History of CVA (cerebrovascular accident) 08/23/2018  . Essential hypertension 08/23/2018  . Chronic right shoulder pain 08/23/2018  . Complete tear of right rotator cuff 08/23/2018  . Acute pain of left shoulder 08/23/2018  . On antineoplastic chemotherapy 08/23/2018  . Hyperlipidemia 08/23/2018  . Neurogenic claudication due to lumbar spinal stenosis 08/23/2018  . Vitamin D deficiency 08/23/2018    Past Surgical History:  Procedure Laterality Date  . APPENDECTOMY    . COLONOSCOPY    . COLONOSCOPY WITH PROPOFOL N/A 07/16/2019   Procedure: COLONOSCOPY WITH PROPOFOL;  Surgeon: Lollie Sails, MD;  Location: [facility] ENDOSCOPY;  Service: Endoscopy;  Laterality: N/A;  . ESOPHAGOGASTRODUODENOSCOPY (EGD) WITH PROPOFOL N/A 07/16/2019   Procedure: ESOPHAGOGASTRODUODENOSCOPY (EGD) WITH PROPOFOL;  Surgeon: Lollie Sails, MD;  Location: [facility] ENDOSCOPY;  Service: Endoscopy;  Laterality: N/A;  . TUBAL LIGATION       OB History   No obstetric history on file.     Family History  Problem Relation Age of Onset  . Breast cancer Neg Hx     Social History   Tobacco Use  . Smoking status: Never Smoker  . Smokeless tobacco: Never Used  Vaping Use  . Vaping Use: Never used  Substance Use Topics  . Alcohol use: No  . Drug use: Never    Home  Medications Prior to Admission medications   Medication Sig Start Date End Date Taking? Authorizing Provider  acetaminophen (TYLENOL) 500 MG tablet Take 500 mg by mouth every 6 (six) hours as needed for mild pain.   Yes [provider]  amLODipine (NORVASC) 10 MG tablet Take 10 mg by mouth daily.   Yes [provider]  atorvastatin (LIPITOR) 80 MG tablet Take 80 mg by mouth daily.   Yes  [provider]  dasatinib (SPRYCEL) 80 MG tablet Take 80 mg by mouth daily.   Yes [provider]  diclofenac (VOLTAREN) 50 MG EC tablet Take 1 tablet (50 mg total) by mouth 2 (two) times daily. Patient taking differently: Take 50 mg by mouth 2 (two) times daily as needed for mild pain. 09/16/15  Yes Melynda Ripple, MD  dipyridamole-aspirin (AGGRENOX) 200-25 MG 12hr capsule Take 1 capsule by mouth daily.   Yes [provider]  Fexofenadine HCl (ALLEGRA PO) Take 180 mg by mouth as needed (allergies).   Yes [provider]  fluticasone (FLONASE) 50 MCG/ACT nasal spray Place 2 sprays into both nostrils as needed for allergies or rhinitis.   Yes [provider]  gabapentin (NEURONTIN) 300 MG capsule Take 300 mg by mouth 2 (two) times daily.   Yes [provider]  labetalol (NORMODYNE) 300 MG tablet Take 300 mg by mouth 2 (two) times daily.   Yes [provider]  latanoprost (XALATAN) 0.005 % ophthalmic solution Place 1 drop into both eyes at bedtime.   Yes [provider]  losartan (COZAAR) 100 MG tablet Take 100 mg by mouth daily.   Yes [provider]  magnesium oxide (MAG-OX) 400 MG tablet Take 400 mg by mouth daily.   Yes [provider]  omeprazole (PRILOSEC) 40 MG capsule Take 40 mg by mouth daily.   Yes [provider]  ondansetron (ZOFRAN ODT) 4 MG disintegrating tablet Take 1 tablet (4 mg total) by mouth every 8 (eight) hours as needed for nausea or vomiting. 12/02/20  Yes Zackarie Chason S, PA  oxyCODONE (OXY IR/ROXICODONE) 5 MG immediate release tablet Take 10 mg by mouth every 6 (six) hours as needed for pain. 09/18/20  Yes [provider]  spironolactone (ALDACTONE) 25 MG tablet Take 25 mg by mouth daily.   Yes [provider]  traMADol (ULTRAM) 50 MG tablet Take 50 mg by mouth every 6 (six) hours as needed for moderate pain. 08/14/20  Yes [provider]     Allergies    Patient has no known allergies.  Review of Systems   Review of Systems  Constitutional: Negative for chills and fever.  HENT: Negative for congestion.   Eyes: Negative for pain.  Respiratory: Positive for cough. Negative for shortness of breath.   Cardiovascular: Negative for chest pain and leg swelling.  Gastrointestinal: Positive for abdominal pain, nausea and vomiting. Negative for diarrhea.  Genitourinary: Negative for dysuria.  Musculoskeletal: Negative for myalgias.  Skin: Negative for rash.  Neurological: Negative for dizziness and headaches.    Physical Exam Updated Vital Signs BP 130/90 (BP Location: Right Arm)   Pulse 84   Temp 98.7 F (37.1 C) (Oral)   Resp 16   Ht 5\' 6"  (1.676 m)   Wt 113.4 kg   SpO2 100%   BMI 40.35 kg/m   Physical Exam Vitals and nursing note reviewed.  Constitutional:      General: She is not in acute distress.    Comments: Morbidly obese 80 year old  female sitting in bed in no acute distress  HENT:     Head: Normocephalic and atraumatic.     Nose: Nose normal.     Mouth/Throat:     Mouth: Mucous membranes are moist.  Eyes:     General: No scleral icterus. Cardiovascular:     Rate and Rhythm: Normal rate and regular rhythm.     Pulses: Normal pulses.     Heart sounds: Normal heart sounds.  Pulmonary:     Effort: Pulmonary effort is normal. No respiratory distress.     Breath sounds: No wheezing.  Abdominal:     Palpations: Abdomen is soft.     Tenderness: There is no abdominal tenderness. There is no right CVA tenderness, left CVA tenderness, guarding or rebound.     Comments: No abdominal tenderness. Abdomen is obese. Pannus was lifted and palpated beneath. There is no intertriginous irritation or lesions. Abdomen is soft and nontender. No guarding or rebound.  Musculoskeletal:     Cervical back: Normal range of motion.     Right lower leg: No edema.     Left lower leg: No edema.  Skin:    General: Skin is  warm and dry.     Capillary Refill: Capillary refill takes less than 2 seconds.  Neurological:     Mental Status: She is alert. Mental status is at baseline.  Psychiatric:        Mood and Affect: Mood normal.        Behavior: Behavior normal.     ED Results / Procedures / Treatments   Labs (all labs ordered are listed, but only abnormal results are displayed) Labs Reviewed  COMPREHENSIVE METABOLIC PANEL - Abnormal; Notable for the following components:      Result Value   Sodium 134 (*)    Glucose, Bld 105 (*)    All other components within normal limits  URINALYSIS, ROUTINE W REFLEX MICROSCOPIC - Abnormal; Notable for the following components:   APPearance HAZY (*)    Hgb urine dipstick SMALL (*)    Protein, ur >=300 (*)    All other components within normal limits  LIPASE, BLOOD  CBC    EKG None  Radiology DG Chest Portable 1 View  Result Date: 12/01/2020 CLINICAL DATA:  COVID positive with cough. EXAM: PORTABLE CHEST 1 VIEW COMPARISON:  Chest radiograph October 09, 2009. FINDINGS: The heart size and mediastinal contours are within normal limits. Aortic atherosclerosis. Basilar and perihilar predominant interstitial opacities and bronchial wall thickening. Right reversed shoulder arthroplasty. IMPRESSION: Basilar and perihilar predominant interstitial opacities and bronchial wall thickening, consistent with atypical/viral infection such as COVID-19 pneumonia. Electronically Signed   By: Dahlia Bailiff MD   On: 12/01/2020 23:25    Procedures Procedures (including critical care time)  Medications Ordered in ED Medications  amLODipine (NORVASC) tablet 10 mg (10 mg Oral Given 12/02/20 1218)  labetalol (NORMODYNE) tablet 300 mg (300 mg Oral Given 12/02/20 1217)  ondansetron (ZOFRAN) injection 4 mg (4 mg Intravenous Given 12/02/20 1202)    ED Course  I have reviewed the triage vital signs and the nursing notes.  Pertinent labs & imaging results that were available during  my care of the patient were reviewed by me and considered in my medical decision making (see chart for details).    MDM Rules/Calculators/A&P                          Patient is known COVID-positive  80 year old female presented today with nausea vomiting and abdominal pain. She states all of this is resolved spontaneously after she was given Zofran by EMS. She has had no symptoms since that time. She has been in the ER waiting room for approximately 13 hours.  My physical exam is unremarkable. She is morbidly obese abdominal exam is benign however. She denies any pain at all and states that she has not had any pain during her ER visit. She does not feel nauseous but would like to have an additional dose of Zofran before discharge it has been approximately 10 hours since her last dose of Zofran. We will provide her with one IV dose.  Urinalysis with small hemoglobin and protein and hazy. She will follow-up with her PCP to review these results. CMP unremarkable CBC for leukocytosis or anemia lipase within normal limits doubt pancreatitis. Initially significantly hypertensive however this improved with her home BP medications. She is now tolerating p.o. without difficulty and will continue taking medications at home along with Zofran which is for her symptoms.  I discussed this case with my attending physician who cosigned this note including patient's presenting symptoms, physical exam, and planned diagnostics and interventions. Attending physician stated agreement with plan or made changes to plan which were implemented. Attending physician assessed patient at bedside.   The causes of generalized abdominal pain include but are not limited to AAA, mesenteric ischemia, appendicitis, diverticulitis, DKA, gastritis, gastroenteritis, AMI, nephrolithiasis, pancreatitis, peritonitis, adrenal insufficiency,lead poisoning, iron toxicity, intestinal ischemia, constipation, UTI,SBO/LBO, splenic rupture, biliary  disease, IBD, IBS, PUD, or hepatitis. Ectopic pregnancy, ovarian torsion, PID.  I have very low suspicion for AAA, mesenteric ischemia, SBO or LBO. Also very low suspicion for ectopic ovarian or PID given location of pain. It has been very generalized when she was having and she is having difficulty remembering the pain because it was mild and has resolved many hours ago. She understands return precautions.  Patient discharged with strict return precautions. I also discussed with daughter who is understanding of return precautions and plan to discharge home at this time with Zofran. She has had no episodes of emesis and is tolerating p.o. without difficulty she is ate and drank after antiemetics.  Final Clinical Impression(s) / ED Diagnoses Final diagnoses:  COVID-19  Non-intractable vomiting with nausea, unspecified vomiting type    Rx / DC Orders ED Discharge Orders         Ordered    ondansetron (ZOFRAN ODT) 4 MG disintegrating tablet  Every 8 hours PRN        12/02/20 1412           Tedd Sias, [place] 12/02/20 1614    Lacretia Leigh, MD 12/05/20 0840

## 2020-12-02 NOTE — ED Notes (Signed)
Rhonda (daughter#[phone]) called for an update on patient's status.  Thank you

## 2020-12-02 NOTE — ED Notes (Signed)
Pt sitting up and drinking water for PO challenge. Pt is tolerating well at this time.

## 2020-12-02 NOTE — Discharge Instructions (Signed)
As you tested positive for COVID-19 at home I recommend quarantining until your symptoms have completely resolved.  Please drink plenty of water.  Please use Zofran as needed for nausea.  Please follow-up with your primary care doctor and [facility] that I have given you information for.  Please use Tylenol or ibuprofen for pain.  You may use 600 mg ibuprofen every 6 hours or 1000 mg of Tylenol every 6 hours.  You may choose to alternate between the 2.  This would be most effective.  Not to exceed 4 g of Tylenol within 24 hours.  Not to exceed 3200 mg ibuprofen 24 hours.  Return to the emergency department for any new or concerning symptoms.

## 2020-12-03 ENCOUNTER — Encounter: Payer: Medicare HMO | Admitting: Physical Therapy

## 2020-12-08 ENCOUNTER — Encounter: Payer: Medicare HMO | Admitting: Physical Therapy

## 2020-12-10 ENCOUNTER — Other Ambulatory Visit: Payer: Self-pay

## 2020-12-10 ENCOUNTER — Ambulatory Visit: Payer: Medicare HMO | Admitting: Physical Therapy

## 2020-12-10 ENCOUNTER — Encounter: Payer: Self-pay | Admitting: Physical Therapy

## 2020-12-10 DIAGNOSIS — G8929 Other chronic pain: Secondary | ICD-10-CM

## 2020-12-10 DIAGNOSIS — M6281 Muscle weakness (generalized): Secondary | ICD-10-CM

## 2020-12-10 DIAGNOSIS — R293 Abnormal posture: Secondary | ICD-10-CM

## 2020-12-10 DIAGNOSIS — M25611 Stiffness of right shoulder, not elsewhere classified: Secondary | ICD-10-CM

## 2020-12-10 DIAGNOSIS — M25511 Pain in right shoulder: Secondary | ICD-10-CM | POA: Diagnosis not present

## 2020-12-10 NOTE — Therapy (Signed)
[facility] [address], [place], [place], [address] Phone: [phone]   Fax:  [phone]  Physical Therapy Treatment  Patient Details  Name: Pamela Cox MRN: [id number] Date of Birth: Apr 12, 1941 Referring Provider (PT): Ervin Knack Ewing, PA   Encounter Date: 12/10/2020   PT End of Session - 12/10/20 0851    Visit Number 11    Date for PT Re-Evaluation 01/07/21    Authorization Type [employer] Medicare    Progress Note Due on Visit 81    PT Start Time 0847    PT Stop Time 0927    PT Time Calculation (min) 40 min    Equipment Utilized During Treatment Gait belt    Activity Tolerance Patient tolerated treatment well    Behavior During Therapy [facility] for tasks assessed/performed           Past Medical History:  Diagnosis Date  . Arthritis   . CML (chronic myelocytic leukemia) ([place])   . CVA (cerebral vascular accident) ([place])   . Diabetes mellitus without complication ([place])   . GERD (gastroesophageal reflux disease)   . Hypertension   . Myelocytic leukemia, chronic ([place])   . Obesity   . Sleep apnea     Past Surgical History:  Procedure Laterality Date  . APPENDECTOMY    . COLONOSCOPY    . COLONOSCOPY WITH PROPOFOL N/A 07/16/2019   Procedure: COLONOSCOPY WITH PROPOFOL;  Surgeon: Lollie Sails, MD;  Location[facility] ENDOSCOPY;  Service: Endoscopy;  Laterality: N/A;  . ESOPHAGOGASTRODUODENOSCOPY (EGD) WITH PROPOFOL N/A 07/16/2019   Procedure: ESOPHAGOGASTRODUODENOSCOPY (EGD) WITH PROPOFOL;  Surgeon: Lollie Sails, MD;  Location: [facility] ENDOSCOPY;  Service: Endoscopy;  Laterality: N/A;  . TUBAL LIGATION      There were no vitals filed for this visit.   Subjective Assessment - 12/10/20 0852    Subjective I was sick with COVID last week but still kept the arm moving.  My pain is much better.    Pertinent History gentle functional IR behind back, NO UBE AT ANY PHASE PER PROTOCOL, Pt cleared to remove sling  for ADLs, wear sling at night and in community    Limitations Lifting;House hold activities;Other (comment)    Patient Stated Goals laundry, dishes, cooking    Currently in Pain? No/denies              [facility] PT Assessment - 12/10/20 0001      ROM / Strength   AROM / PROM / Strength AROM      AROM   Right Shoulder Flexion 110 Degrees    Right Shoulder External Rotation 35 Degrees                         [facility] Adult PT Treatment/Exercise - 12/10/20 0001      Shoulder Exercises: Seated   Extension AROM;Right;20 reps    Extension Limitations 2x10 with scap retraction cue    Row Strengthening;Both;20 reps;Theraband    Theraband Level (Shoulder Row) Level 2 (Red)    External Rotation AAROM;Right;20 reps    External Rotation Limitations dowel, hold x 5 sec for tissue stretch    Flexion AROM;10 reps    Flexion Limitations 2x5, PT VC not to trunk lean to Lt    Abduction AROM;Right;20 reps    ABduction Limitations elbow bent, 2x10      Shoulder Exercises: Pulleys   Flexion 3 minutes    Scaption 3 minutes  PT Short Term Goals - 11/04/20 1955      PT SHORT TERM GOAL #1   Title Pt will be ind with initial HEP following protocol for reverse TSA with pain not to exceed 3/10.    Status Achieved      PT SHORT TERM GOAL #2   Title Pt will report pain level </= 3/10 with incorporation of Rt UE for basic ADLs.    Status Achieved      PT SHORT TERM GOAL #3   Title Pt will achieve AA/ROM or A/ROM to at least 110 deg elevation and 30 deg ER.    Status Achieved      PT SHORT TERM GOAL #4   Title Pt will demo submax isometric of all heads of deltoid without pain.    Status Achieved             PT Long Term Goals - 10/21/20 1052      PT LONG TERM GOAL #1   Title Pt will achieve Rt shoulder A/ROM of 110+ elevation, 40 ER, and functional IR to at least L5 for improved reaching tasks and ADLs such as dressing and bathing.    Time 8     Period Weeks    Status New      PT LONG TERM GOAL #2   Title Pt will improve Rt functional shoulder strength for incorporation of Rt UE use for light household tasks such as cleaning, cooking, and laundry with 25lb lifting restriction.    Time 12    Period Weeks    Status New      PT LONG TERM GOAL #3   Title FOTO score improved from 68% limitation to 39%    Time 12    Period Weeks    Status New      PT LONG TERM GOAL #4   Title Pt will be able to return to household and short distance community ambulation with rollator with good stability of Rt UE and min/no pain in Rt shoulder    Time 12    Period Weeks    Status New                 Plan - 12/10/20 0910    Clinical Impression Statement Pt missed last week due to being sick.  She continues to be very weak and has limited mobilty.  She ambulates with hemi-walker for very short distances and close supervision/CGA for safety.  She is now able to perform A/ROM flexion to 110 and Rt shoulder abduciton with short lever arm to 90 deg with minor trunk lean to Lt and scapular hiking.  ER is most limited at 35 deg with signif stiff capsular end feel.  PT advanced HEP to include seated A/ROM for cardinal planes for strength and ROM, and encouraged longer stretch hold times seated ER with dowel for capsular stretch.    Comorbidities falls, CVA, CML, DM, obesity    Examination-Activity Limitations Bed Mobility;Transfers;Locomotion Level;Stand;Lift;Dressing;Carry;Bathing;Toileting    Examination-Participation Restrictions Community Activity;Cleaning;Meal Prep;Laundry;Shop    Stability/Clinical Decision Making Evolving/Moderate complexity    Rehab Potential Good    PT Frequency 3x / week    PT Duration 12 weeks    PT Treatment/Interventions ADLs/Self Care Home Management;Cryotherapy;Electrical Stimulation;Moist Heat;DME Instruction;Gait training;Therapeutic exercise;Therapeutic activities;Functional mobility training;Neuromuscular  re-education;Patient/family education;Manual techniques;Passive range of motion;Wheelchair mobility training    PT Next Visit Plan continue AA/ROM, A/ROM, manual techniques, scapular strength, pulleys    PT Home Exercise Plan Access Code: [email or web address]  Consulted and Agree with Plan of Care Patient           Patient will benefit from skilled therapeutic intervention in order to improve the following deficits and impairments:     Visit Diagnosis: Chronic right shoulder pain  Muscle weakness (generalized)  Abnormal posture  Stiffness of right shoulder, not elsewhere classified     Problem List Patient Active Problem List   Diagnosis Date Noted  . Allergic rhinitis due to pollen 08/23/2018  . DDD (degenerative disc disease), lumbar 08/23/2018  . Lumbar neuritis 08/23/2018  . 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  . GERD (gastroesophageal reflux disease) 08/23/2018  . Morbid obesity ([place]) 08/23/2018  . Obstructive sleep apnea 08/23/2018  . Intervertebral disc disorder with radiculopathy of lumbar region 08/23/2018  . CML (chronic myelocytic leukemia) ([place]) 08/23/2018  . Hx of myocardial perfusion scan 08/23/2018  . Osteoarthritis 08/23/2018  . Degenerative arthritis of hip 08/23/2018  . Lumbar spondylosis 08/23/2018  . SOB (shortness of breath) 08/23/2018  . History of CVA (cerebrovascular accident) 08/23/2018  . Essential hypertension 08/23/2018  . Chronic right shoulder pain 08/23/2018  . Complete tear of right rotator cuff 08/23/2018  . Acute pain of left shoulder 08/23/2018  . On antineoplastic chemotherapy 08/23/2018  . Hyperlipidemia 08/23/2018  . Neurogenic claudication due to lumbar spinal stenosis 08/23/2018  . Vitamin D deficiency 08/23/2018    Pamela Cox 12/10/2020, 9:21 AM  [facility] [address] [place], [place], [address] Phone: [phone]   Fax:  [phone]  Name: Pamela Cox MRN: [id number] Date of Birth: 1941/10/22

## 2020-12-11 ENCOUNTER — Encounter: Payer: Self-pay | Admitting: Physical Therapy

## 2020-12-11 ENCOUNTER — Ambulatory Visit: Payer: Medicare HMO | Admitting: Physical Therapy

## 2020-12-11 DIAGNOSIS — M25611 Stiffness of right shoulder, not elsewhere classified: Secondary | ICD-10-CM

## 2020-12-11 DIAGNOSIS — R293 Abnormal posture: Secondary | ICD-10-CM

## 2020-12-11 DIAGNOSIS — M6281 Muscle weakness (generalized): Secondary | ICD-10-CM

## 2020-12-11 DIAGNOSIS — M25511 Pain in right shoulder: Secondary | ICD-10-CM | POA: Diagnosis not present

## 2020-12-11 DIAGNOSIS — G8929 Other chronic pain: Secondary | ICD-10-CM

## 2020-12-11 NOTE — Patient Instructions (Signed)
Access Code: [email or web address] URL: [email or web address] Date: 12/11/2020 Prepared by: Ruben Im  Exercises Seated Shoulder Flexion Towel Slide at Table Top - 3 x daily - 7 x weekly - 1 sets - 10 reps Seated Shoulder Scaption Slide at Table Top with Forearm in Neutral - 3 x daily - 7 x weekly - 1 sets - 10 reps Supine Shoulder Flexion AAROM with Hands Clasped - 1 x daily - 7 x weekly - 1 sets - 10 reps Seated Elbow Flexion and Extension AROM - 3 x daily - 7 x weekly - 1 sets - 10 reps Wrist Circumduction AROM - 3 x daily - 7 x weekly - 1 sets - 10 reps Supine Shoulder Flexion Extension AAROM with Dowel - 1 x daily - 7 x weekly - 3 sets - 10 reps Supine Shoulder Protraction with Dowel - 1 x daily - 7 x weekly - 3 sets - 10 reps Supine Shoulder External Rotation with Dowel at 20 Degrees of Abduction - 1 x daily - 7 x weekly - 3 sets - 10 reps Seated Shoulder Flexion Full Range - 1 x daily - 7 x weekly - 2 sets - 10 reps Seated Shoulder Abduction with Bent Elbow - 1 x daily - 7 x weekly - 2 sets - 10 reps Seated Single Arm Shoulder Extension - 1 x daily - 7 x weekly - 2 sets - 10 reps Seated Shoulder External Rotation AAROM with Dowel - 1 x daily - 7 x weekly - 2 sets - 10 reps - 5 hold Seated Single Arm Shoulder Flexion with Dumbbell - 1 x daily - 7 x weekly - 1 sets - 10 reps

## 2020-12-11 NOTE — Therapy (Signed)
[facility] [address], [place], [place], [address] Phone: [phone]   Fax:  [phone]  Physical Therapy Treatment  Patient Details  Name: Pamela Cox MRN: [id number] Date of Birth: [birth year] Referring Provider (PT): Ervin Knack Ewing, PA   Encounter Date: 12/11/2020   PT End of Session - 12/11/20 0848    Visit Number 12    Date for PT Re-Evaluation 01/07/21    Authorization Type [employer]na Medicare    Progress Note Due on Visit 20    PT Start Time 0800    PT Stop Time 0841    PT Time Calculation (min) 41 min    Activity Tolerance Patient tolerated treatment well           Past Medical History:  Diagnosis Date  . Arthritis   . CML (chronic myelocytic leukemia) ([place])   . CVA (cerebral vascular accident) ([place])   . Diabetes mellitus without complication ([place])   . GERD (gastroesophageal reflux disease)   . Hypertension   . Myelocytic leukemia, chronic ([place])   . Obesity   . Sleep apnea     Past Surgical History:  Procedure Laterality Date  . APPENDECTOMY    . COLONOSCOPY    . COLONOSCOPY WITH PROPOFOL N/A 07/16/2019   Procedure: COLONOSCOPY WITH PROPOFOL;  Surgeon: Lollie Sails, MD;  Location: [facility] ENDOSCOPY;  Service: Endoscopy;  Laterality: N/A;  . ESOPHAGOGASTRODUODENOSCOPY (EGD) WITH PROPOFOL N/A 07/16/2019   Procedure: ESOPHAGOGASTRODUODENOSCOPY (EGD) WITH PROPOFOL;  Surgeon: Lollie Sails, MD;  Location: [facility] ENDOSCOPY;  Service: Endoscopy;  Laterality: N/A;  . TUBAL LIGATION      There were no vitals filed for this visit.   Subjective Assessment - 12/11/20 0758    Subjective Presents with 4 wheeled walker.  I walk bent over b/c it hurts my back.  [place] was my last day of quarantine after + for covid.  Just a little tired.  After PT I usually have to take pain medication about 2 hours later.    Pertinent History gentle functional IR behind back, NO UBE AT ANY PHASE PER PROTOCOL, Pt  cleared to remove sling for ADLs, wear sling at night and in community    Currently in Pain? No/denies    Pain Score 0-No pain    Pain Location Shoulder                             [facility] Adult PT Treatment/Exercise - 12/11/20 0001      Shoulder Exercises: Seated   Extension Strengthening;Theraband;Left;Right    Theraband Level (Shoulder Extension) Level 1 (Yellow)    Row Strengthening;Both;20 reps;Theraband    Theraband Level (Shoulder Row) Level 2 (Red)    External Rotation AAROM;Right;20 reps    External Rotation Limitations dowel, hold x 5 sec for tissue stretch    Flexion AAROM;Both;20 reps    Flexion Limitations cane/dowel assist    Abduction AROM;Right;20 reps    ABduction Limitations elbow bent, 2x10    Other Seated Exercises 1# weight to shoulder level 10x    Other Seated Exercises 1# bicep curls 10x      Shoulder Exercises: Pulleys   Flexion 3 minutes    Scaption 3 minutes      Shoulder Exercises: ROM/Strengthening   [place] seated hand at shoulder level on Ranger Rt UE circles 1' each way, seated flexion, scaption x1'  PT Education - 12/11/20 0848    Education Details seated shoulder flexion with soup can    Person(s) Educated Patient    Methods Explanation;Demonstration;Handout    Comprehension Returned demonstration;Verbalized understanding            PT Short Term Goals - 11/04/20 1955      PT SHORT TERM GOAL #1   Title Pt will be ind with initial HEP following protocol for reverse TSA with pain not to exceed 3/10.    Status Achieved      PT SHORT TERM GOAL #2   Title Pt will report pain level </= 3/10 with incorporation of Rt UE for basic ADLs.    Status Achieved      PT SHORT TERM GOAL #3   Title Pt will achieve AA/ROM or A/ROM to at least 110 deg elevation and 30 deg ER.    Status Achieved      PT SHORT TERM GOAL #4   Title Pt will demo submax isometric of all heads of deltoid without pain.    Status  Achieved             PT Long Term Goals - 10/21/20 1052      PT LONG TERM GOAL #1   Title Pt will achieve Rt shoulder A/ROM of 110+ elevation, 40 ER, and functional IR to at least L5 for improved reaching tasks and ADLs such as dressing and bathing.    Time 8    Period Weeks    Status New      PT LONG TERM GOAL #2   Title Pt will improve Rt functional shoulder strength for incorporation of Rt UE use for light household tasks such as cleaning, cooking, and laundry with 25lb lifting restriction.    Time 12    Period Weeks    Status New      PT LONG TERM GOAL #3   Title FOTO score improved from 68% limitation to 39%    Time 12    Period Weeks    Status New      PT LONG TERM GOAL #4   Title Pt will be able to return to household and short distance community ambulation with rollator with good stability of Rt UE and min/no pain in Rt shoulder    Time 12    Period Weeks    Status New                 Plan - 12/11/20 1017    Clinical Impression Statement The patient requires mod assist with sit to stand but ambulates 60 feet across the gym quickly using her 4 wheeled walker.  She is able to continue with ROM and strengthening seated against gravity as opposed to gravity assisted in lying positions.  Verbal cues to limit compensatory strategies for limited external rotation and elevation.  She denies pain during treatment session but states she typically has pain later in the day after PT.    Therapist also monitoring for excessive fatigue given her recent bout with covid.  No shortness of breath or coughing during session.    Personal Factors and Comorbidities Age;Comorbidity 1;Comorbidity 2;Comorbidity 3+;Time since onset of injury/illness/exacerbation    Comorbidities falls, CVA, CML, DM, obesity    Examination-Activity Limitations Bed Mobility;Transfers;Locomotion Level;Stand;Lift;Dressing;Carry;Bathing;Toileting    Examination-Participation Restrictions Community  Activity;Cleaning;Meal Prep;Laundry;Shop    Rehab Potential Good    PT Frequency 3x / week    PT Duration 12 weeks    PT Treatment/Interventions  ADLs/Self Care Home Management;Cryotherapy;Electrical Stimulation;Moist Heat;DME Instruction;Gait training;Therapeutic exercise;Therapeutic activities;Functional mobility training;Neuromuscular re-education;Patient/family education;Manual techniques;Passive range of motion;Wheelchair mobility training    PT Next Visit Plan continue AA/ROM, A/ROM, light strengthening per phase 4 of Dr. Rondall Allegra protocol;   manual techniques, scapular strength, pulleys    PT Home Exercise Plan Access Code: [email or web address]           Patient will benefit from skilled therapeutic intervention in order to improve the following deficits and impairments:  Abnormal gait,Decreased range of motion,Difficulty walking,Obesity,Decreased activity tolerance,Pain,Postural dysfunction,Decreased strength,Increased edema,Decreased balance,Hypomobility  Visit Diagnosis: Chronic right shoulder pain  Muscle weakness (generalized)  Abnormal posture  Stiffness of right shoulder, not elsewhere classified     Problem List Patient Active Problem List   Diagnosis Date Noted  . Allergic rhinitis due to pollen 08/23/2018  . DDD (degenerative disc disease), lumbar 08/23/2018  . Lumbar neuritis 08/23/2018  . 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  . GERD (gastroesophageal reflux disease) 08/23/2018  . Morbid obesity ([place]) 08/23/2018  . Obstructive sleep apnea 08/23/2018  . Intervertebral disc disorder with radiculopathy of lumbar region 08/23/2018  . CML (chronic myelocytic leukemia) ([place]) 08/23/2018  . Hx of myocardial perfusion scan 08/23/2018  . Osteoarthritis 08/23/2018  . Degenerative arthritis of hip 08/23/2018  . Lumbar spondylosis 08/23/2018  . SOB (shortness of breath) 08/23/2018  . History of CVA (cerebrovascular accident) 08/23/2018  . Essential hypertension 08/23/2018  . Chronic  right shoulder pain 08/23/2018  . Complete tear of right rotator cuff 08/23/2018  . Acute pain of left shoulder 08/23/2018  . On antineoplastic chemotherapy 08/23/2018  . Hyperlipidemia 08/23/2018  . Neurogenic claudication due to lumbar spinal stenosis 08/23/2018  . Vitamin D deficiency 08/23/2018   Ruben Im, PT 12/11/20 8:57 AM Phone: [phone] Fax: [phone]  Alvera Singh 12/11/2020, 8:57 AM  [facility] [address] [place], [place], [address] Phone: [phone]   Fax:  [phone]  Name: Pamela Cox MRN: [id number] Date of Birth: 07-15-1941

## 2020-12-15 ENCOUNTER — Ambulatory Visit: Payer: Medicare HMO | Admitting: Physical Therapy

## 2020-12-15 ENCOUNTER — Other Ambulatory Visit: Payer: Self-pay

## 2020-12-15 ENCOUNTER — Encounter: Payer: Self-pay | Admitting: Physical Therapy

## 2020-12-15 DIAGNOSIS — M25511 Pain in right shoulder: Secondary | ICD-10-CM | POA: Diagnosis not present

## 2020-12-15 DIAGNOSIS — M6281 Muscle weakness (generalized): Secondary | ICD-10-CM

## 2020-12-15 DIAGNOSIS — M25611 Stiffness of right shoulder, not elsewhere classified: Secondary | ICD-10-CM

## 2020-12-15 DIAGNOSIS — R293 Abnormal posture: Secondary | ICD-10-CM

## 2020-12-15 DIAGNOSIS — G8929 Other chronic pain: Secondary | ICD-10-CM

## 2020-12-15 NOTE — Therapy (Signed)
[facility] [address] [place], [place], [address] Phone: [phone]   Fax:  [phone]  Physical Therapy Treatment  Patient Details  Name: Pamela Cox MRN: [id number] Date of Birth: 02-28-1941 Referring Provider (PT): Ervin Knack Ewing, PA   Encounter Date: 12/15/2020   PT End of Session - 12/15/20 0850    Visit Number 13    Date for PT Re-Evaluation 01/07/21    Authorization Type [employer]    Progress Note Due on Visit 20    PT Start Time 0845    PT Stop Time 0930    PT Time Calculation (min) 45 min    Activity Tolerance Patient tolerated treatment well    Behavior During Therapy [facility] for tasks assessed/performed           Past Medical History:  Diagnosis Date  . Arthritis   . CML (chronic myelocytic leukemia) ([place])   . CVA (cerebral vascular accident) ([place])   . Diabetes mellitus without complication ([place])   . GERD (gastroesophageal reflux disease)   . Hypertension   . Myelocytic leukemia, chronic ([place])   . Obesity   . Sleep apnea     Past Surgical History:  Procedure Laterality Date  . APPENDECTOMY    . COLONOSCOPY    . COLONOSCOPY WITH PROPOFOL N/A 07/16/2019   Procedure: COLONOSCOPY WITH PROPOFOL;  Surgeon: Lollie Sails, MD;  Location: [facility] ENDOSCOPY;  Service: Endoscopy;  Laterality: N/A;  . ESOPHAGOGASTRODUODENOSCOPY (EGD) WITH PROPOFOL N/A 07/16/2019   Procedure: ESOPHAGOGASTRODUODENOSCOPY (EGD) WITH PROPOFOL;  Surgeon: Lollie Sails, MD;  Location: [facility] ENDOSCOPY;  Service: Endoscopy;  Laterality: N/A;  . TUBAL LIGATION      There were no vitals filed for this visit.   Subjective Assessment - 12/15/20 0848    Subjective 2/10 pain in the front of the shoulder.  I forgot to try adding the soup can in for strength but I've been working on my high five reaches overhead.    Pertinent History gentle functional IR behind back, NO UBE AT ANY PHASE PER PROTOCOL    Limitations  Lifting;House hold activities;Other (comment)    Patient Stated Goals laundry, dishes, cooking    Currently in Pain? Yes    Pain Score 2     Pain Location Shoulder    Pain Orientation Right;Anterior    Pain Descriptors / Indicators Sore    Pain Type Surgical pain    Pain Onset More than a month ago              [facility] PT Assessment - 12/15/20 0001      AROM   Overall AROM Comments functional IR Rt shoulder to mid-gluteal region    Right Shoulder Flexion 110 Degrees    Right Shoulder External Rotation 35 Degrees   AA/ROM     PROM   Right Shoulder Flexion 130 Degrees                         [facility] Adult PT Treatment/Exercise - 12/15/20 0001      Exercises   Exercises Shoulder      Shoulder Exercises: Seated   Extension Strengthening;20 reps;Theraband;Right    Theraband Level (Shoulder Extension) Level 2 (Red)    Row Strengthening;Both;Theraband;20 reps    Theraband Level (Shoulder Row) Level 3 (Green)    Row Limitations pause in retraction x 5 sec    External Rotation AAROM;Right;20 reps;Strengthening    Theraband Level (  Shoulder External Rotation) Level 1 (Yellow)    External Rotation Limitations dowel, hold x 5 sec for tissue stretch, then bil ER green band small range for isometrics    Flexion AROM;Right;20 reps    Other Seated Exercises backward shoulder rolls x 20 Rt    Other Seated Exercises scapular retraction x 15 reps hold 5 sec, Rt      Shoulder Exercises: Pulleys   Flexion 3 minutes      Manual Therapy   Manual Therapy Soft tissue mobilization    Soft tissue mobilization Rt: periscapular, pectorals, upper trap, deltoid                    PT Short Term Goals - 11/04/20 1955      PT SHORT TERM GOAL #1   Title Pt will be ind with initial HEP following protocol for reverse TSA with pain not to exceed 3/10.    Status Achieved      PT SHORT TERM GOAL #2   Title Pt will report pain level </= 3/10 with incorporation of Rt UE for  basic ADLs.    Status Achieved      PT SHORT TERM GOAL #3   Title Pt will achieve AA/ROM or A/ROM to at least 110 deg elevation and 30 deg ER.    Status Achieved      PT SHORT TERM GOAL #4   Title Pt will demo submax isometric of all heads of deltoid without pain.    Status Achieved             PT Long Term Goals - 12/15/20 0933      PT LONG TERM GOAL #1   Title Pt will achieve Rt shoulder A/ROM of 110+ elevation, 40 ER, and functional IR to at least L5 for improved reaching tasks and ADLs such as dressing and bathing.    Baseline met for flexion and functional IR    Status On-going      PT LONG TERM GOAL #2   Title Pt will improve Rt functional shoulder strength for incorporation of Rt UE use for light household tasks such as cleaning, cooking, and laundry with 25lb lifting restriction.    Status On-going      PT LONG TERM GOAL #3   Title FOTO score improved from 68% limitation to 39%    Status On-going      PT LONG TERM GOAL #4   Title Pt will be able to return to household and short distance community ambulation with rollator with good stability of Rt UE and min/no pain in Rt shoulder    Status Achieved                 Plan - 12/15/20 0923    Clinical Impression Statement Pt arrived with 2/10 pain in anterior shoulder.  She has tightness in anterior shoulder and pectorals with some tenderness.  PT spent more time today than previous visits with manual STM to improve resting position and mobility of Rt shoulder girdle with improved quality of AA/ROM and A/ROM as a result.  Pt is gaining improved awareness of scpaular retraction and activation of scapular stabilizers.  PT advanced resistance for seated row, ext and added resistance to small range ER.  Pt is now using rolling walker with very light weight bearing through bil UEs as she feels safer with this than hemiwalker.  Pt is able to achieve overhead reach to 110 deg with minor substitutions.  Continue along  POC  following protocol and working on range, strength, and quality of motion.    Comorbidities falls, CVA, CML, DM, obesity    PT Frequency 3x / week    PT Duration 12 weeks    PT Treatment/Interventions ADLs/Self Care Home Management;Cryotherapy;Electrical Stimulation;Moist Heat;DME Instruction;Gait training;Therapeutic exercise;Therapeutic activities;Functional mobility training;Neuromuscular re-education;Patient/family education;Manual techniques;Passive range of motion;Wheelchair mobility training    PT Next Visit Plan continue ROM and strength, quality of motion, STM to pectorals and scapular facil of retraction/depression    PT Home Exercise Plan Access Code: [email or web address]    Consulted and Agree with Plan of Care Patient           Patient will benefit from skilled therapeutic intervention in order to improve the following deficits and impairments:     Visit Diagnosis: Chronic right shoulder pain  Muscle weakness (generalized)  Abnormal posture  Stiffness of right shoulder, not elsewhere classified     Problem List Patient Active Problem List   Diagnosis Date Noted  . Allergic rhinitis due to pollen 08/23/2018  . DDD (degenerative disc disease), lumbar 08/23/2018  . Lumbar neuritis 08/23/2018  . 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  . GERD (gastroesophageal reflux disease) 08/23/2018  . Morbid obesity ([place]) 08/23/2018  . Obstructive sleep apnea 08/23/2018  . Intervertebral disc disorder with radiculopathy of lumbar region 08/23/2018  . CML (chronic myelocytic leukemia) ([place]) 08/23/2018  . Hx of myocardial perfusion scan 08/23/2018  . Osteoarthritis 08/23/2018  . Degenerative arthritis of hip 08/23/2018  . Lumbar spondylosis 08/23/2018  . SOB (shortness of breath) 08/23/2018  . History of CVA (cerebrovascular accident) 08/23/2018  . Essential hypertension 08/23/2018  . Chronic right shoulder pain 08/23/2018  . Complete tear of right rotator cuff 08/23/2018  . Acute pain of left  shoulder 08/23/2018  . On antineoplastic chemotherapy 08/23/2018  . Hyperlipidemia 08/23/2018  . Neurogenic claudication due to lumbar spinal stenosis 08/23/2018  . Vitamin D deficiency 08/23/2018    Baruch Merl, PT 12/15/20 9:34 AM   [facility] [address] [place], [place], [address] Phone: [phone]   Fax:  [phone]  Name: Pamela Cox MRN: [id number] Date of Birth: 16-Nov-1941

## 2020-12-17 ENCOUNTER — Ambulatory Visit: Payer: Medicare HMO | Admitting: Physical Therapy

## 2020-12-17 ENCOUNTER — Other Ambulatory Visit: Payer: Self-pay

## 2020-12-17 ENCOUNTER — Encounter: Payer: Self-pay | Admitting: Physical Therapy

## 2020-12-17 DIAGNOSIS — G8929 Other chronic pain: Secondary | ICD-10-CM

## 2020-12-17 DIAGNOSIS — M25611 Stiffness of right shoulder, not elsewhere classified: Secondary | ICD-10-CM

## 2020-12-17 DIAGNOSIS — R293 Abnormal posture: Secondary | ICD-10-CM

## 2020-12-17 DIAGNOSIS — M25511 Pain in right shoulder: Secondary | ICD-10-CM

## 2020-12-17 DIAGNOSIS — M6281 Muscle weakness (generalized): Secondary | ICD-10-CM

## 2020-12-17 NOTE — Therapy (Signed)
[facility] [address] [place], [place], [address] Phone: [phone]   Fax:  [phone]  Physical Therapy Treatment  Patient Details  Name: Pamela Cox MRN: [id number] Date of Birth: 1941-06-10 Referring Provider (PT): Ervin Knack Ewing, PA   Encounter Date: 12/17/2020   PT End of Session - 12/17/20 0926    Visit Number 14    Date for PT Re-Evaluation 01/07/21    Authorization Type [employer] Medicare    Progress Note Due on Visit 20    PT Start Time 0847    PT Stop Time 0928    PT Time Calculation (min) 41 min    Activity Tolerance Patient tolerated treatment well    Behavior During Therapy [facility] for tasks assessed/performed           Past Medical History:  Diagnosis Date  . Arthritis   . CML (chronic myelocytic leukemia) ([place])   . CVA (cerebral vascular accident) ([place])   . Diabetes mellitus without complication ([place])   . GERD (gastroesophageal reflux disease)   . Hypertension   . Myelocytic leukemia, chronic ([place])   . Obesity   . Sleep apnea     Past Surgical History:  Procedure Laterality Date  . APPENDECTOMY    . COLONOSCOPY    . COLONOSCOPY WITH PROPOFOL N/A 07/16/2019   Procedure: COLONOSCOPY WITH PROPOFOL;  Surgeon: Lollie Sails, MD;  Location: [facility] ENDOSCOPY;  Service: Endoscopy;  Laterality: N/A;  . ESOPHAGOGASTRODUODENOSCOPY (EGD) WITH PROPOFOL N/A 07/16/2019   Procedure: ESOPHAGOGASTRODUODENOSCOPY (EGD) WITH PROPOFOL;  Surgeon: Lollie Sails, MD;  Location: [facility] ENDOSCOPY;  Service: Endoscopy;  Laterality: N/A;  . TUBAL LIGATION      There were no vitals filed for this visit.   Subjective Assessment - 12/17/20 0901    Subjective I am working really hard on my shoulder exercises at home.  No pain.    Pertinent History gentle functional IR behind back, NO UBE AT ANY PHASE PER PROTOCOL    Patient Stated Goals laundry, dishes, cooking    Currently in Pain? No/denies               [facility] PT Assessment - 12/17/20 0001      AROM   Overall AROM Comments functional IR to mid gluteal region    Right Shoulder Flexion 110 Degrees    Right Shoulder External Rotation 35 Degrees      PROM   Right Shoulder Flexion 135 Degrees   AA/ROM     Strength   Overall Strength Comments Lt shoulder 4/5 with exception of abd 4-/5                         [facility] Adult PT Treatment/Exercise - 12/17/20 0001      Ambulation/Gait   Gait Comments Pt able to perform sit to stand with more ind today - supervision only, improved upright posture and control with gait noted today, some cardiovascular challenge with this since had covid      Exercises   Exercises Shoulder      Shoulder Exercises: Supine   Protraction Strengthening;Both;20 reps    Protraction Weight (lbs) 3    Protraction Limitations chest press with protraction reach holding dowel bil UEs    External Rotation Strengthening;Both;Theraband;20 reps    Theraband Level (Shoulder External Rotation) Level 1 (Yellow)    Flexion Strengthening;Right;Weights;15 reps    Shoulder Flexion Weight (lbs) 1  Shoulder Exercises: Seated   Row Strengthening;Both;20 reps;Theraband    Theraband Level (Shoulder Row) Level 3 (Green)    External Rotation AAROM;Right;20 reps;Strengthening    Theraband Level (Shoulder External Rotation) Level 1 (Yellow)    External Rotation Limitations dowel, hold x 5 sec for tissue stretch, then bil ER green band small range for isometrics    Flexion AROM;Both;Right;20 reps    Flexion Limitations seated bil with dowel, Rt UE A/ROM overhead high fives 2x10      Shoulder Exercises: Standing   Internal Rotation Left;AROM;15 reps      Shoulder Exercises: Pulleys   Flexion 3 minutes                    PT Short Term Goals - 11/04/20 1955      PT SHORT TERM GOAL #1   Title Pt will be ind with initial HEP following protocol for reverse TSA with pain not to exceed 3/10.     Status Achieved      PT SHORT TERM GOAL #2   Title Pt will report pain level </= 3/10 with incorporation of Rt UE for basic ADLs.    Status Achieved      PT SHORT TERM GOAL #3   Title Pt will achieve AA/ROM or A/ROM to at least 110 deg elevation and 30 deg ER.    Status Achieved      PT SHORT TERM GOAL #4   Title Pt will demo submax isometric of all heads of deltoid without pain.    Status Achieved             PT Long Term Goals - 12/15/20 0933      PT LONG TERM GOAL #1   Title Pt will achieve Rt shoulder A/ROM of 110+ elevation, 40 ER, and functional IR to at least L5 for improved reaching tasks and ADLs such as dressing and bathing.    Baseline met for flexion and functional IR    Status On-going      PT LONG TERM GOAL #2   Title Pt will improve Rt functional shoulder strength for incorporation of Rt UE use for light household tasks such as cleaning, cooking, and laundry with 25lb lifting restriction.    Status On-going      PT LONG TERM GOAL #3   Title FOTO score improved from 68% limitation to 39%    Status On-going      PT LONG TERM GOAL #4   Title Pt will be able to return to household and short distance community ambulation with rollator with good stability of Rt UE and min/no pain in Rt shoulder    Status Achieved                 Plan - 12/17/20 0926    Clinical Impression Statement Pt continues to make progress with ROM and strenght of Rt shoulder.  She is using Rt arm for dressing, ADLs, and folding clothes.  She is limited in standing activities due to back pain.  She demo'd much improved independence with sit to stand and controlled gait with rolling walker today.  She accomplished 135 deg elevation AA/ROM and 110 deg A/ROM.  She has funcitonal IR to mid-gluteal region.  ER is stiff and holding at 35 deg.  Shoulder strength ranges from 4-/5 to 4/5.  She has excellent pain control.  She demos much improved awareness and activation of scapular retractors and  depressors.  There is less substitution with  overhead ROM.  Continue along POC to continue progressing strength and ROM of Rt shoulder.    Comorbidities falls, CVA, CML, DM, obesity    Rehab Potential Good    PT Frequency 3x / week    PT Duration 12 weeks    PT Next Visit Plan continue ROM and strength, quality of motion, STM to pectorals and scapular facil of retraction/depression    PT Home Exercise Plan Access Code: [email or web address]    Consulted and Agree with Plan of Care Patient           Patient will benefit from skilled therapeutic intervention in order to improve the following deficits and impairments:     Visit Diagnosis: Chronic right shoulder pain  Muscle weakness (generalized)  Abnormal posture  Stiffness of right shoulder, not elsewhere classified     Problem List Patient Active Problem List   Diagnosis Date Noted  . Allergic rhinitis due to pollen 08/23/2018  . DDD (degenerative disc disease), lumbar 08/23/2018  . Lumbar neuritis 08/23/2018  . 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  . GERD (gastroesophageal reflux disease) 08/23/2018  . Morbid obesity ([place]) 08/23/2018  . Obstructive sleep apnea 08/23/2018  . Intervertebral disc disorder with radiculopathy of lumbar region 08/23/2018  . CML (chronic myelocytic leukemia) ([place]) 08/23/2018  . Hx of myocardial perfusion scan 08/23/2018  . Osteoarthritis 08/23/2018  . Degenerative arthritis of hip 08/23/2018  . Lumbar spondylosis 08/23/2018  . SOB (shortness of breath) 08/23/2018  . History of CVA (cerebrovascular accident) 08/23/2018  . Essential hypertension 08/23/2018  . Chronic right shoulder pain 08/23/2018  . Complete tear of right rotator cuff 08/23/2018  . Acute pain of left shoulder 08/23/2018  . On antineoplastic chemotherapy 08/23/2018  . Hyperlipidemia 08/23/2018  . Neurogenic claudication due to lumbar spinal stenosis 08/23/2018  . Vitamin D deficiency 08/23/2018    Baruch Merl, PT 12/17/20 9:33  AM   [facility] [address] [place], [place], [address] Phone: [phone]   Fax:  [phone]  Name: Pamela Cox MRN: [id number] Date of Birth: [birth year]

## 2020-12-23 ENCOUNTER — Encounter: Payer: Self-pay | Admitting: Physical Therapy

## 2020-12-23 ENCOUNTER — Ambulatory Visit: Payer: Medicare HMO | Attending: Physician Assistant | Admitting: Physical Therapy

## 2020-12-23 ENCOUNTER — Other Ambulatory Visit: Payer: Self-pay

## 2020-12-23 DIAGNOSIS — G8929 Other chronic pain: Secondary | ICD-10-CM | POA: Insufficient documentation

## 2020-12-23 DIAGNOSIS — M5441 Lumbago with sciatica, right side: Secondary | ICD-10-CM | POA: Diagnosis present

## 2020-12-23 DIAGNOSIS — M6281 Muscle weakness (generalized): Secondary | ICD-10-CM | POA: Diagnosis present

## 2020-12-23 DIAGNOSIS — R293 Abnormal posture: Secondary | ICD-10-CM | POA: Diagnosis present

## 2020-12-23 DIAGNOSIS — M5442 Lumbago with sciatica, left side: Secondary | ICD-10-CM | POA: Diagnosis present

## 2020-12-23 DIAGNOSIS — M25511 Pain in right shoulder: Secondary | ICD-10-CM | POA: Diagnosis not present

## 2020-12-23 DIAGNOSIS — M25611 Stiffness of right shoulder, not elsewhere classified: Secondary | ICD-10-CM | POA: Diagnosis present

## 2020-12-23 DIAGNOSIS — R262 Difficulty in walking, not elsewhere classified: Secondary | ICD-10-CM | POA: Diagnosis present

## 2020-12-23 NOTE — Therapy (Signed)
[facility] [address], [place], [place], [address] Phone: [phone]   Fax:  [phone]  Physical Therapy Treatment  Patient Details  Name: Pamela Cox MRN: [id number] Date of Birth: 1940/12/26 Referring Provider (PT): Ervin Knack Ewing, PA   Encounter Date: 12/23/2020   PT End of Session - 12/23/20 0844    Visit Number 15    Date for PT Re-Evaluation 01/07/21    Authorization Type [employer]na Medicare    Progress Note Due on Visit 21    PT Start Time 0844    PT Stop Time 0929    PT Time Calculation (min) 45 min    Activity Tolerance Patient tolerated treatment well    Behavior During Therapy [facility] for tasks assessed/performed           Past Medical History:  Diagnosis Date  . Arthritis   . CML (chronic myelocytic leukemia) ([place])   . CVA (cerebral vascular accident) ([place])   . Diabetes mellitus without complication ([place])   . GERD (gastroesophageal reflux disease)   . Hypertension   . Myelocytic leukemia, chronic ([place])   . Obesity   . Sleep apnea     Past Surgical History:  Procedure Laterality Date  . APPENDECTOMY    . COLONOSCOPY    . COLONOSCOPY WITH PROPOFOL N/A 07/16/2019   Procedure: COLONOSCOPY WITH PROPOFOL;  Surgeon: Lollie Sails, MD;  Location[facility] END[facility]COPY;  Service: Endoscopy;  Laterality: N/A;  . ESOPHAGOGASTRODUODENOSCOPY (EGD) WITH PROPOFOL N/A 07/16/2019   Procedure: ESOPHAGOGASTRODUODENOSCOPY (EGD) WITH PROPOFOL;  Surgeon: Lollie Sails, MD;  Location: [facility] ENDOSCOPY;  Service: Endoscopy;  Laterality: N/A;  . TUBAL LIGATION      There were no vitals filed for this visit.   Subjective Assessment - 12/23/20 0844    Subjective I keep working it everyday.    Pertinent History gentle functional IR behind back, NO UBE AT ANY PHASE PER PROTOCOL    Limitations Lifting;House hold activities;Other (comment)    Patient Stated Goals laundry, dishes, cooking    Currently in Pain?  No/denies                             [facility] Adult PT Treatment/Exercise - 12/23/20 0001      Shoulder Exercises: Supine   Protraction Strengthening;Both;20 reps    Protraction Weight (lbs) 3    Protraction Limitations chest press holding dowel bil UEs    External Rotation Right;20 reps;Weights    Theraband Level (Shoulder External Rotation) Level 1 (Yellow)    External Rotation Limitations cues for 90 deg elbow and correct plane    Flexion Strengthening;Right;Weights;15 reps    Shoulder Flexion Weight (lbs) 1      Shoulder Exercises: Seated   Elevation Both;10 reps    Row Strengthening;Both;20 reps;Theraband    Theraband Level (Shoulder Row) Level 3 (Green)    External Rotation AROM;AAROM;10 reps    External Rotation Limitations dowel, hold x 5 sec for tissue stretch, then bil ER green band small range for isometrics 10 sec hold x 5    Flexion AROM;Both;Right;20 reps      Shoulder Exercises: Sidelying   ABduction 20 reps;10 reps;Weights    ABduction Weight (lbs) 1    ABduction Limitations 10 no wt; 1# short arm x 10 and long arm  x 20      Shoulder Exercises: Standing   Internal Rotation Left;AROM;15 reps  Shoulder Exercises: Pulleys   Flexion 3 minutes    Scaption 3 minutes                    PT Short Term Goals - 11/04/20 1955      PT SHORT TERM GOAL #1   Title Pt will be ind with initial HEP following protocol for reverse TSA with pain not to exceed 3/10.    Status Achieved      PT SHORT TERM GOAL #2   Title Pt will report pain level </= 3/10 with incorporation of Rt UE for basic ADLs.    Status Achieved      PT SHORT TERM GOAL #3   Title Pt will achieve AA/ROM or A/ROM to at least 110 deg elevation and 30 deg ER.    Status Achieved      PT SHORT TERM GOAL #4   Title Pt will demo submax isometric of all heads of deltoid without pain.    Status Achieved             PT Long Term Goals - 12/15/20 0933      PT LONG TERM  GOAL #1   Title Pt will achieve Rt shoulder A/ROM of 110+ elevation, 40 ER, and functional IR to at least L5 for improved reaching tasks and ADLs such as dressing and bathing.    Baseline met for flexion and functional IR    Status On-going      PT LONG TERM GOAL #2   Title Pt will improve Rt functional shoulder strength for incorporation of Rt UE use for light household tasks such as cleaning, cooking, and laundry with 25lb lifting restriction.    Status On-going      PT LONG TERM GOAL #3   Title FOTO score improved from 68% limitation to 39%    Status On-going      PT LONG TERM GOAL #4   Title Pt will be able to return to household and short distance community ambulation with rollator with good stability of Rt UE and min/no pain in Rt shoulder    Status Achieved                 Plan - 12/23/20 0931    Clinical Impression Statement Patient did very well with therapy today. She does need continual cueing for proper form especially with ER, but was also good about self correcting posture with flexion activities. She was unable to maintain proper form with seated ER with yellow band so we did ER in supine with 1# weight. She did very well with this. Sit to stand required min A to CGA today. IR still difficult. PT encouraged focusing on maintaining good posture in with IR and with sitting in general.    Personal Factors and Comorbidities Age;Comorbidity 1;Comorbidity 2;Comorbidity 3+;Time since onset of injury/illness/exacerbation    Comorbidities falls, CVA, CML, DM, obesity    Examination-Activity Limitations Bed Mobility;Transfers;Locomotion Level;Stand;Lift;Dressing;Carry;Bathing;Toileting    Examination-Participation Restrictions Community Activity;Cleaning;Meal Prep;Laundry;Shop    PT Treatment/Interventions ADLs/Self Care Home Management;Cryotherapy;Electrical Stimulation;Moist Heat;DME Instruction;Gait training;Therapeutic exercise;Therapeutic activities;Functional mobility  training;Neuromuscular re-education;Patient/family education;Manual techniques;Passive range of motion;Wheelchair mobility training    PT Next Visit Plan continue ROM and strength, quality of motion, STM to pectorals and scapular facil of retraction/depression    PT Home Exercise Plan Access Code: [email or web address]           Patient will benefit from skilled therapeutic intervention in order to improve the following deficits and impairments:  Abnormal gait,Decreased range of motion,Difficulty walking,Obesity,Decreased activity tolerance,Pain,Postural dysfunction,Decreased strength,Increased edema,Decreased balance,Hypomobility  Visit Diagnosis: Chronic right shoulder pain  Muscle weakness (generalized)  Abnormal posture  Stiffness of right shoulder, not elsewhere classified  Difficulty in walking, not elsewhere classified  Chronic bilateral low back pain with bilateral sciatica     Problem List Patient Active Problem List   Diagnosis Date Noted  . Allergic rhinitis due to pollen 08/23/2018  . DDD (degenerative disc disease), lumbar 08/23/2018  . Lumbar neuritis 08/23/2018  . 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  . GERD (gastroesophageal reflux disease) 08/23/2018  . Morbid obesity ([place]) 08/23/2018  . Obstructive sleep apnea 08/23/2018  . Intervertebral disc disorder with radiculopathy of lumbar region 08/23/2018  . CML (chronic myelocytic leukemia) ([place]) 08/23/2018  . Hx of myocardial perfusion scan 08/23/2018  . Osteoarthritis 08/23/2018  . Degenerative arthritis of hip 08/23/2018  . Lumbar spondylosis 08/23/2018  . SOB (shortness of breath) 08/23/2018  . History of CVA (cerebrovascular accident) 08/23/2018  . Essential hypertension 08/23/2018  . Chronic right shoulder pain 08/23/2018  . Complete tear of right rotator cuff 08/23/2018  . Acute pain of left shoulder 08/23/2018  . On antineoplastic chemotherapy 08/23/2018  . Hyperlipidemia 08/23/2018  . Neurogenic claudication due to  lumbar spinal stenosis 08/23/2018  . Vitamin D deficiency 08/23/2018    Madelyn Flavors PT 12/23/2020, 9:39 AM  [facility] [address], [place], [place], [address] Phone: [phone]   Fax:  [phone]  Name: Pamela Cox MRN: [id number] Date of Birth: 11-Jan-1941

## 2020-12-26 ENCOUNTER — Encounter: Payer: Self-pay | Admitting: Physical Therapy

## 2020-12-26 ENCOUNTER — Other Ambulatory Visit: Payer: Self-pay

## 2020-12-26 ENCOUNTER — Ambulatory Visit: Payer: Medicare HMO | Admitting: Physical Therapy

## 2020-12-26 DIAGNOSIS — M6281 Muscle weakness (generalized): Secondary | ICD-10-CM

## 2020-12-26 DIAGNOSIS — G8929 Other chronic pain: Secondary | ICD-10-CM

## 2020-12-26 DIAGNOSIS — R293 Abnormal posture: Secondary | ICD-10-CM

## 2020-12-26 DIAGNOSIS — M25611 Stiffness of right shoulder, not elsewhere classified: Secondary | ICD-10-CM

## 2020-12-26 DIAGNOSIS — M25511 Pain in right shoulder: Secondary | ICD-10-CM | POA: Diagnosis not present

## 2020-12-26 NOTE — Therapy (Signed)
[facility] [address], [place], [place], [address] Phone: [phone]   Fax:  [phone]  Physical Therapy Treatment  Patient Details  Name: Pamela Cox MRN: [id number] Date of Birth: 07/29/1941 Referring Provider (PT): Ervin Knack Ewing, PA   Encounter Date: 12/26/2020   PT End of Session - 12/26/20 0936    Visit Number 16    Date for PT Re-Evaluation 01/07/21    Authorization Type Aetna Medicare    Progress Note Due on Visit 20    PT Start Time 0932    PT Stop Time 1016    PT Time Calculation (min) 44 min    Activity Tolerance Patient tolerated treatment well    Behavior During Therapy [facility] for tasks assessed/performed           Past Medical History:  Diagnosis Date  . Arthritis   . CML (chronic myelocytic leukemia) ([place])   . CVA (cerebral vascular accident) ([place])   . Diabetes mellitus without complication ([place])   . GERD (gastroesophageal reflux disease)   . Hypertension   . Myelocytic leukemia, chronic ([place])   . Obesity   . Sleep apnea     Past Surgical History:  Procedure Laterality Date  . APPENDECTOMY    . COLONOSCOPY    . COLONOSCOPY WITH PROPOFOL N/A 07/16/2019   Procedure: COLONOSCOPY WITH PROPOFOL;  Surgeon: Lollie Sails, MD;  Location: [facility] ENDOSCOPY;  Service: Endoscopy;  Laterality: N/A;  . ESOPHAGOGASTRODUODENOSCOPY (EGD) WITH PROPOFOL N/A 07/16/2019   Procedure: ESOPHAGOGASTRODUODENOSCOPY (EGD) WITH PROPOFOL;  Surgeon: Lollie Sails, MD;  Location: [facility] ENDOSCOPY;  Service: Endoscopy;  Laterality: N/A;  . TUBAL LIGATION      There were no vitals filed for this visit.   Subjective Assessment - 12/26/20 0935    Subjective Feeling pretty good, no pain. Did ok after last session.  I saw the MD and he wants me to do more PT to keep working on strengthening.    Pertinent History gentle functional IR behind back, NO UBE AT ANY PHASE PER PROTOCOL    Patient Stated Goals  laundry, dishes, cooking    Currently in Pain? No/denies                             [facility] Adult PT Treatment/Exercise - 12/26/20 0001      Exercises   Exercises Shoulder      Shoulder Exercises: Supine   Protraction Strengthening;Both;20 reps    Protraction Weight (lbs) 3    Protraction Limitations chest press holding dowel bil UEs    Flexion Strengthening;Right;Weights;15 reps    Shoulder Flexion Weight (lbs) 1    Other Supine Exercises ER stretch Rt UE holding 2lb weight elbow propped on large towel roll, 4x30 sec      Shoulder Exercises: Seated   Elevation Strengthening;AROM;Right;20 reps    Elevation Limitations 2x10, to approx 110 deg, PT VCs for eccentric control    Row Strengthening;Both;20 reps;Theraband    Theraband Level (Shoulder Row) Level 3 (Green);Level 4 (Blue)    Row Limitations 10x each green then blue, PT VCs to keep trunk still and isolate row      Shoulder Exercises: Sidelying   External Rotation Strengthening;Right;10 reps    External Rotation Weight (lbs) 2    External Rotation Limitations 2x5, TC and VC for form, small range, towel roll under elbow    ABduction Strengthening;Right;20 reps;Weights;AROM    ABduction  Weight (lbs) 1    ABduction Limitations 10 no wt; 1# 10x long arm x10, 2lb short arm      Shoulder Exercises: Standing   Internal Rotation Limitations using a sheet to stretch 3x10      Shoulder Exercises: Pulleys   Flexion 3 minutes    ABduction 3 minutes   more toward abd than scaption                   PT Short Term Goals - 11/04/20 1955      PT SHORT TERM GOAL #1   Title Pt will be ind with initial HEP following protocol for reverse TSA with pain not to exceed 3/10.    Status Achieved      PT SHORT TERM GOAL #2   Title Pt will report pain level </= 3/10 with incorporation of Rt UE for basic ADLs.    Status Achieved      PT SHORT TERM GOAL #3   Title Pt will achieve AA/ROM or A/ROM to at least 110  deg elevation and 30 deg ER.    Status Achieved      PT SHORT TERM GOAL #4   Title Pt will demo submax isometric of all heads of deltoid without pain.    Status Achieved             PT Long Term Goals - 12/15/20 0933      PT LONG TERM GOAL #1   Title Pt will achieve Rt shoulder A/ROM of 110+ elevation, 40 ER, and functional IR to at least L5 for improved reaching tasks and ADLs such as dressing and bathing.    Baseline met for flexion and functional IR    Status On-going      PT LONG TERM GOAL #2   Title Pt will improve Rt functional shoulder strength for incorporation of Rt UE use for light household tasks such as cleaning, cooking, and laundry with 25lb lifting restriction.    Status On-going      PT LONG TERM GOAL #3   Title FOTO score improved from 68% limitation to 39%    Status On-going      PT LONG TERM GOAL #4   Title Pt will be able to return to household and short distance community ambulation with rollator with good stability of Rt UE and min/no pain in Rt shoulder    Status Achieved                 Plan - 12/26/20 1003    Clinical Impression Statement Pt continues to work very hard in therapy.  She continues to be limited in ER and IR ROM but responds well to gentle stretching.  ER improved signif with addition of supine stretch holding 2lb weight in Rt UE today.  She is able to use 1-3lb weights for strengthening of all muscle groups for Rt shoulder.  A/ROM elevation against gravity is 110 deg.  Pt saw MD who gave a new PT Rx to continue with skilled PT for ongoing gains for ROM, strength and functional use of Rt UE.  Pt needs heavy cueing for ER ther ex for form and activation of ERs.  Overall mobility has improved to supervision with gait with RW and CGA for sit to stands within session.    Personal Factors and Comorbidities Age;Comorbidity 1;Comorbidity 2;Comorbidity 3+;Time since onset of injury/illness/exacerbation    Comorbidities falls, CVA, CML, DM,  obesity  Patient will benefit from skilled therapeutic intervention in order to improve the following deficits and impairments:     Visit Diagnosis: Chronic right shoulder pain  Muscle weakness (generalized)  Abnormal posture  Stiffness of right shoulder, not elsewhere classified     Problem List Patient Active Problem List   Diagnosis Date Noted  . Allergic rhinitis due to pollen 08/23/2018  . DDD (degenerative disc disease), lumbar 08/23/2018  . Lumbar neuritis 08/23/2018  . 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  . GERD (gastroesophageal reflux disease) 08/23/2018  . Morbid obesity ([place]) 08/23/2018  . Obstructive sleep apnea 08/23/2018  . Intervertebral disc disorder with radiculopathy of lumbar region 08/23/2018  . CML (chronic myelocytic leukemia) ([place]) 08/23/2018  . Hx of myocardial perfusion scan 08/23/2018  . Osteoarthritis 08/23/2018  . Degenerative arthritis of hip 08/23/2018  . Lumbar spondylosis 08/23/2018  . SOB (shortness of breath) 08/23/2018  . History of CVA (cerebrovascular accident) 08/23/2018  . Essential hypertension 08/23/2018  . Chronic right shoulder pain 08/23/2018  . Complete tear of right rotator cuff 08/23/2018  . Acute pain of left shoulder 08/23/2018  . On antineoplastic chemotherapy 08/23/2018  . Hyperlipidemia 08/23/2018  . Neurogenic claudication due to lumbar spinal stenosis 08/23/2018  . Vitamin D deficiency 08/23/2018    [place] 12/26/2020, 12:06 PM  [facility] [address], [place], [place], [address] Phone: [phone]   Fax:  [phone]  Name: Pamela Cox MRN: [id number] Date of Birth: 1941/07/19

## 2020-12-30 ENCOUNTER — Ambulatory Visit: Payer: Medicare HMO | Admitting: Physical Therapy

## 2020-12-30 ENCOUNTER — Other Ambulatory Visit: Payer: Self-pay

## 2020-12-30 ENCOUNTER — Encounter: Payer: Self-pay | Admitting: Physical Therapy

## 2020-12-30 DIAGNOSIS — M25511 Pain in right shoulder: Secondary | ICD-10-CM | POA: Diagnosis not present

## 2020-12-30 DIAGNOSIS — M25611 Stiffness of right shoulder, not elsewhere classified: Secondary | ICD-10-CM

## 2020-12-30 DIAGNOSIS — G8929 Other chronic pain: Secondary | ICD-10-CM

## 2020-12-30 DIAGNOSIS — R293 Abnormal posture: Secondary | ICD-10-CM

## 2020-12-30 DIAGNOSIS — M6281 Muscle weakness (generalized): Secondary | ICD-10-CM

## 2020-12-30 NOTE — Therapy (Signed)
[facility] [address], [place], [place], [address] Phone: [phone]   Fax:  [phone]  Physical Therapy Treatment  Patient Details  Name: Pamela Cox MRN: [id number] Date of Birth: 17-Jun-1941 Referring Provider (PT): Ervin Knack Ewing, PA   Encounter Date: 12/30/2020   PT End of Session - 12/30/20 1235    Visit Number 17    Date for PT Re-Evaluation 01/07/21    Authorization Type Aetna Medicare    Progress Note Due on Visit 20    PT Start Time 1230    PT Stop Time 1314    PT Time Calculation (min) 44 min    Activity Tolerance Patient tolerated treatment well    Behavior During Therapy [facility] for tasks assessed/performed           Past Medical History:  Diagnosis Date  . Arthritis   . CML (chronic myelocytic leukemia) ([place])   . CVA (cerebral vascular accident) ([place])   . Diabetes mellitus without complication ([place])   . GERD (gastroesophageal reflux disease)   . Hypertension   . Myelocytic leukemia, chronic ([place])   . Obesity   . Sleep apnea     Past Surgical History:  Procedure Laterality Date  . APPENDECTOMY    . COLONOSCOPY    . COLONOSCOPY WITH PROPOFOL N/A 07/16/2019   Procedure: COLONOSCOPY WITH PROPOFOL;  Surgeon: Lollie Sails, MD;  Location: [facility] ENDOSCOPY;  Service: Endoscopy;  Laterality: N/A;  . ESOPHAGOGASTRODUODENOSCOPY (EGD) WITH PROPOFOL N/A 07/16/2019   Procedure: ESOPHAGOGASTRODUODENOSCOPY (EGD) WITH PROPOFOL;  Surgeon: Lollie Sails, MD;  Location: [facility] ENDOSCOPY;  Service: Endoscopy;  Laterality: N/A;  . TUBAL LIGATION      There were no vitals filed for this visit.   Subjective Assessment - 12/30/20 1234    Subjective Is it ok to use heat?  I've used it a few times and it feels so good.  I have done well after sessions.  My reaching behind my back is getting better.    Pertinent History gentle functional IR behind back, NO UBE AT ANY PHASE PER PROTOCOL    Limitations  Lifting;House hold activities;Other (comment)    Patient Stated Goals laundry, dishes, cooking    Currently in Pain? No/denies                             [facility] Adult PT Treatment/Exercise - 12/30/20 0001      Lumbar Exercises: Aerobic   Nustep L1 x 4' end of session, PT present to monitor      Shoulder Exercises: Supine   Protraction Strengthening;20 reps    Protraction Weight (lbs) 3      Shoulder Exercises: Seated   Elevation Strengthening;AROM;Right;20 reps    Elevation Limitations 2x10, to approx 110 deg, PT VCs for eccentric control    Row Strengthening;Both;20 reps;Theraband    Theraband Level (Shoulder Row) Level 4 (Blue)    Row Limitations 2x10    External Rotation Strengthening;Both;10 reps    External Rotation Limitations 5 sec holds spreading green loop band, towel rolls inside elbows for form    Flexion AROM;Strengthening;Right;20 reps    Flexion Weight (lbs) 1    Flexion Limitations 10x A/ROM to 120 deg, 1lb 1x5 then 1x10 to 90 deg    Other Seated Exercises elbow flexion Rt 2x10 3lb      Shoulder Exercises: Sidelying   ABduction Strengthening;Right;20 reps;Weights    ABduction Weight (lbs)  2    ABduction Limitations 2x10      Shoulder Exercises: Standing   Internal Rotation Limitations using a sheet to stretch 20 reps, then 3x10      Shoulder Exercises: Pulleys   Flexion 2 minutes    ABduction 1 minute      Shoulder Exercises: ROM/Strengthening   Proximal Shoulder Strengthening, Supine 2lb circles 2x10 each way at 90 deg                    PT Short Term Goals - 11/04/20 1955      PT SHORT TERM GOAL #1   Title Pt will be ind with initial HEP following protocol for reverse TSA with pain not to exceed 3/10.    Status Achieved      PT SHORT TERM GOAL #2   Title Pt will report pain level </= 3/10 with incorporation of Rt UE for basic ADLs.    Status Achieved      PT SHORT TERM GOAL #3   Title Pt will achieve AA/ROM or A/ROM  to at least 110 deg elevation and 30 deg ER.    Status Achieved      PT SHORT TERM GOAL #4   Title Pt will demo submax isometric of all heads of deltoid without pain.    Status Achieved             PT Long Term Goals - 12/30/20 1316      PT LONG TERM GOAL #1   Title Pt will achieve Rt shoulder A/ROM of 110+ elevation, 40 ER, and functional IR to at least L5 for improved reaching tasks and ADLs such as dressing and bathing.    Baseline met for flexion and functional IR, good progress on ER    Status On-going      PT LONG TERM GOAL #2   Title Pt will improve Rt functional shoulder strength for incorporation of Rt UE use for light household tasks such as cleaning, cooking, and laundry with 25lb lifting restriction.    Status On-going      PT LONG TERM GOAL #3   Title FOTO score improved from 68% limitation to 39%    Status On-going      PT LONG TERM GOAL #4   Title Pt will be able to return to household and short distance community ambulation with rollator with good stability of Rt UE and min/no pain in Rt shoulder    Status Achieved                 Plan - 12/30/20 1309    Clinical Impression Statement Pt continues to make gains in both A/ROM and strength of Rt shoulder.  Active elevation reaches 120 deg, improved by 10 deg today.  Pt was able to better recruit her Rt shoulder ERs in sitting today with newfound ER range of motion.  Passive gentle stretching is helping IR and ER ROM.  Pt able to increase both weight resistance and reps today.  She does not have any increased symptoms between visits to date since increasing strength focus.  She was able to do NuStep end of session and demo'd CGA for transfers today.  Gait is close supervision only, using RW.  Continue along POC.    Comorbidities falls, CVA, CML, DM, obesity    PT Frequency 3x / week    PT Duration 12 weeks    PT Treatment/Interventions ADLs/Self Care Home Management;Cryotherapy;Electrical Stimulation;Moist  Heat;DME Instruction;Gait training;Therapeutic exercise;Therapeutic  activities;Functional mobility training;Neuromuscular re-education;Patient/family education;Manual techniques;Passive range of motion;Wheelchair mobility training    PT Next Visit Plan continue Rt shoulder strength, ROM, NuStep, gentle IR/ER stretching    PT Home Exercise Plan Access Code: [email or web address]    Consulted and Agree with Plan of Care Patient           Patient will benefit from skilled therapeutic intervention in order to improve the following deficits and impairments:     Visit Diagnosis: Chronic right shoulder pain  Muscle weakness (generalized)  Abnormal posture  Stiffness of right shoulder, not elsewhere classified     Problem List Patient Active Problem List   Diagnosis Date Noted  . Allergic rhinitis due to pollen 08/23/2018  . DDD (degenerative disc disease), lumbar 08/23/2018  . Lumbar neuritis 08/23/2018  . 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  . GERD (gastroesophageal reflux disease) 08/23/2018  . Morbid obesity ([place]) 08/23/2018  . Obstructive sleep apnea 08/23/2018  . Intervertebral disc disorder with radiculopathy of lumbar region 08/23/2018  . CML (chronic myelocytic leukemia) ([place]) 08/23/2018  . Hx of myocardial perfusion scan 08/23/2018  . Osteoarthritis 08/23/2018  . Degenerative arthritis of hip 08/23/2018  . Lumbar spondylosis 08/23/2018  . SOB (shortness of breath) 08/23/2018  . History of CVA (cerebrovascular accident) 08/23/2018  . Essential hypertension 08/23/2018  . Chronic right shoulder pain 08/23/2018  . Complete tear of right rotator cuff 08/23/2018  . Acute pain of left shoulder 08/23/2018  . On antineoplastic chemotherapy 08/23/2018  . Hyperlipidemia 08/23/2018  . Neurogenic claudication due to lumbar spinal stenosis 08/23/2018  . Vitamin D deficiency 08/23/2018    Pamela Cox 12/30/2020, 1:17 PM  [facility] [address] [place], [place], [address] Phone: [phone]   Fax:  [phone]  Name: Pamela Cox MRN: [id number] Date of Birth: [birth year]

## 2021-01-02 ENCOUNTER — Ambulatory Visit: Payer: Medicare HMO | Admitting: Physical Therapy

## 2021-01-02 ENCOUNTER — Encounter: Payer: Self-pay | Admitting: Physical Therapy

## 2021-01-02 ENCOUNTER — Other Ambulatory Visit: Payer: Self-pay

## 2021-01-02 DIAGNOSIS — M6281 Muscle weakness (generalized): Secondary | ICD-10-CM

## 2021-01-02 DIAGNOSIS — M25611 Stiffness of right shoulder, not elsewhere classified: Secondary | ICD-10-CM

## 2021-01-02 DIAGNOSIS — M25511 Pain in right shoulder: Secondary | ICD-10-CM | POA: Diagnosis not present

## 2021-01-02 DIAGNOSIS — G8929 Other chronic pain: Secondary | ICD-10-CM

## 2021-01-02 DIAGNOSIS — R293 Abnormal posture: Secondary | ICD-10-CM

## 2021-01-02 NOTE — Therapy (Signed)
[facility] [address], [place], [place], [address] Phone: [phone]   Fax:  [phone]  Physical Therapy Treatment  Patient Details  Name: Pamela Cox MRN: [id number] Date of Birth: 04-09-1941 Referring Provider (PT): Ervin Knack Ewing, PA   Encounter Date: 01/02/2021   PT End of Session - 01/02/21 0758    Visit Number 18    Date for PT Re-Evaluation 01/07/21    Authorization Type [employer]    Progress Note Due on Visit 20    PT Start Time 0800    PT Stop Time 0842    PT Time Calculation (min) 42 min    Activity Tolerance Patient tolerated treatment well    Behavior During Therapy [facility] for tasks assessed/performed           Past Medical History:  Diagnosis Date  . Arthritis   . CML (chronic myelocytic leukemia) ([place])   . CVA (cerebral vascular accident) ([place])   . Diabetes mellitus without complication ([place])   . GERD (gastroesophageal reflux disease)   . Hypertension   . Myelocytic leukemia, chronic ([place])   . Obesity   . Sleep apnea     Past Surgical History:  Procedure Laterality Date  . APPENDECTOMY    . COLONOSCOPY    . COLONOSCOPY WITH PROPOFOL N/A 07/16/2019   Procedure: COLONOSCOPY WITH PROPOFOL;  Surgeon: Lollie Sails, MD;  Location: [facility] ENDOSCOPY;  Service: Endoscopy;  Laterality: N/A;  . ESOPHAGOGASTRODUODENOSCOPY (EGD) WITH PROPOFOL N/A 07/16/2019   Procedure: ESOPHAGOGASTRODUODENOSCOPY (EGD) WITH PROPOFOL;  Surgeon: Lollie Sails, MD;  Location: [facility] ENDOSCOPY;  Service: Endoscopy;  Laterality: N/A;  . TUBAL LIGATION      There were no vitals filed for this visit.   Subjective Assessment - 01/02/21 0806    Subjective Feeling good.  No pain.  My back is better than earlier this week too.    Pertinent History gentle functional IR behind back, NO UBE AT ANY PHASE PER PROTOCOL    Limitations Lifting;House hold activities;Other (comment)    Patient Stated Goals laundry,  dishes, cooking    Currently in Pain? No/denies                             [facility] Adult PT Treatment/Exercise - 01/02/21 0001      Therapeutic Activites    Therapeutic Activities Other Therapeutic Activities    Other Therapeutic Activities cone stacking to overhead shelf 2x5 Rt UE      Exercises   Exercises Shoulder      Shoulder Exercises: Supine   Protraction Strengthening;20 reps    Protraction Weight (lbs) 2    Protraction Limitations flexion to 90 then 5 punches, lower with control, 4 cycles      Shoulder Exercises: Seated   Elevation Strengthening;Right;Weights;20 reps    Elevation Limitations alt w/ 1lb weight every 5 reps, 4x5 reps      Shoulder Exercises: Sidelying   External Rotation Strengthening;Right;20 reps;Weights    External Rotation Weight (lbs) 1    External Rotation Limitations 2x10 PT TCs for proper form, small range given limited available ROM    ABduction Strengthening;Right;Weights;20 reps   2 sets, 1x20 with each weight   ABduction Weight (lbs) 1, 2, 3    ABduction Limitations 1x10 each weight, VCs and counting aloud by PT for eccentric control over 3 sec, concentric over 1 sec      Shoulder Exercises: Standing  Row Strengthening;Both;20 reps;Theraband    Theraband Level (Shoulder Row) Level 3 (Green)    Row Limitations 2x10, seated break to work seated ther ex in between sets for LBP relief      Shoulder Exercises: Pulleys   Flexion 3 minutes    ABduction 3 minutes      Shoulder Exercises: ROM/Strengthening   Wall Wash 4x5 circles alt each way Rt UE at 90 deg                    PT Short Term Goals - 11/04/20 1955      PT SHORT TERM GOAL #1   Title Pt will be ind with initial HEP following protocol for reverse TSA with pain not to exceed 3/10.    Status Achieved      PT SHORT TERM GOAL #2   Title Pt will report pain level </= 3/10 with incorporation of Rt UE for basic ADLs.    Status Achieved      PT SHORT  TERM GOAL #3   Title Pt will achieve AA/ROM or A/ROM to at least 110 deg elevation and 30 deg ER.    Status Achieved      PT SHORT TERM GOAL #4   Title Pt will demo submax isometric of all heads of deltoid without pain.    Status Achieved             PT Long Term Goals - 12/30/20 1316      PT LONG TERM GOAL #1   Title Pt will achieve Rt shoulder A/ROM of 110+ elevation, 40 ER, and functional IR to at least L5 for improved reaching tasks and ADLs such as dressing and bathing.    Baseline met for flexion and functional IR, good progress on ER    Status On-going      PT LONG TERM GOAL #2   Title Pt will improve Rt functional shoulder strength for incorporation of Rt UE use for light household tasks such as cleaning, cooking, and laundry with 25lb lifting restriction.    Status On-going      PT LONG TERM GOAL #3   Title FOTO score improved from 68% limitation to 39%    Status On-going      PT LONG TERM GOAL #4   Title Pt will be able to return to household and short distance community ambulation with rollator with good stability of Rt UE and min/no pain in Rt shoulder    Status Achieved                 Plan - 01/02/21 0841    Clinical Impression Statement Pt had less back pain today and was able to tolerate short duration standing ther ex for shoulder today.  She demo'd minimal shoulder hiking compensation today with overhead cone stacking today.  She has much improved Rt shoulder ER which allows for improved ER muscle recruitment with SL ther ex.  She is tolerating increased reps and weights with flexion and abd against gravity.  She needed some TC and VC for eccentric control and form for first few reps but then demo'd good ind with each exercise.  ERO next visit.    Comorbidities falls, CVA, CML, DM, obesity    PT Next Visit Plan ERO, continue Rt shoulder strength, ROM, NuStep, gentle IR/ER stretching    PT Home Exercise Plan Access Code: [email or web address]    Consulted and Agree  with Plan of Care Patient  Patient will benefit from skilled therapeutic intervention in order to improve the following deficits and impairments:     Visit Diagnosis: Chronic right shoulder pain  Muscle weakness (generalized)  Abnormal posture  Stiffness of right shoulder, not elsewhere classified     Problem List Patient Active Problem List   Diagnosis Date Noted  . Allergic rhinitis due to pollen 08/23/2018  . DDD (degenerative disc disease), lumbar 08/23/2018  . Lumbar neuritis 08/23/2018  . 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  . GERD (gastroesophageal reflux disease) 08/23/2018  . Morbid obesity ([place]) 08/23/2018  . Obstructive sleep apnea 08/23/2018  . Intervertebral disc disorder with radiculopathy of lumbar region 08/23/2018  . CML (chronic myelocytic leukemia) ([place]) 08/23/2018  . Hx of myocardial perfusion scan 08/23/2018  . Osteoarthritis 08/23/2018  . Degenerative arthritis of hip 08/23/2018  . Lumbar spondylosis 08/23/2018  . SOB (shortness of breath) 08/23/2018  . History of CVA (cerebrovascular accident) 08/23/2018  . Essential hypertension 08/23/2018  . Chronic right shoulder pain 08/23/2018  . Complete tear of right rotator cuff 08/23/2018  . Acute pain of left shoulder 08/23/2018  . On antineoplastic chemotherapy 08/23/2018  . Hyperlipidemia 08/23/2018  . Neurogenic claudication due to lumbar spinal stenosis 08/23/2018  . Vitamin D deficiency 08/23/2018    Alene Mires Pamela Cox 01/02/2021, 8:43 AM  [facility] [address] [place], [place], [address] Phone: [phone]   Fax:  [phone]  Name: Pamela Cox MRN: [id number] Date of Birth: [birth year]

## 2021-01-06 ENCOUNTER — Encounter: Payer: Self-pay | Admitting: Physical Therapy

## 2021-01-06 ENCOUNTER — Ambulatory Visit: Payer: Medicare HMO | Admitting: Physical Therapy

## 2021-01-06 ENCOUNTER — Other Ambulatory Visit: Payer: Self-pay

## 2021-01-06 DIAGNOSIS — R262 Difficulty in walking, not elsewhere classified: Secondary | ICD-10-CM

## 2021-01-06 DIAGNOSIS — R293 Abnormal posture: Secondary | ICD-10-CM

## 2021-01-06 DIAGNOSIS — G8929 Other chronic pain: Secondary | ICD-10-CM

## 2021-01-06 DIAGNOSIS — M6281 Muscle weakness (generalized): Secondary | ICD-10-CM

## 2021-01-06 DIAGNOSIS — M25511 Pain in right shoulder: Secondary | ICD-10-CM | POA: Diagnosis not present

## 2021-01-06 DIAGNOSIS — M25611 Stiffness of right shoulder, not elsewhere classified: Secondary | ICD-10-CM

## 2021-01-06 NOTE — Therapy (Signed)
[facility] [address] [place], [place], [address] Phone: [phone]   Fax:  [phone]  Physical Therapy Treatment  Patient Details  Name: Pamela Cox MRN: [id number] Date of Birth: Dec 30, 1940 Referring Provider (PT): Ervin Knack Ewing, PA   Encounter Date: 01/06/2021   PT End of Session - 01/06/21 1058    Visit Number 19    Date for PT Re-Evaluation 03/03/21    Authorization Type [employer]na Medicare    Progress Note Due on Visit 20    PT Start Time 1100    PT Stop Time 1145    PT Time Calculation (min) 45 min    Activity Tolerance Patient tolerated treatment well    Behavior During Therapy [facility] for tasks assessed/performed           Past Medical History:  Diagnosis Date  . Arthritis   . CML (chronic myelocytic leukemia) ([place])   . CVA (cerebral vascular accident) (Pamela Cox)   . Diabetes mellitus without complication (Pamela Cox)   . GERD (gastroesophageal reflux disease)   . Hypertension   . Myelocytic leukemia, chronic (Pamela Cox)   . Obesity   . Sleep apnea     Past Surgical History:  Procedure Laterality Date  . APPENDECTOMY    . COLONOSCOPY    . COLONOSCOPY WITH PROPOFOL N/A 07/16/2019   Procedure: COLONOSCOPY WITH PROPOFOL;  Surgeon: Lollie Sails, MD;  Location[facility] ENDOSCOPY;  Service: Endoscopy;  Laterality: N/A;  . ESOPHAGOGASTRODUODENOSCOPY (EGD) WITH PROPOFOL N/A 07/16/2019   Procedure: ESOPHAGOGASTRODUODENOSCOPY (EGD) WITH PROPOFOL;  Surgeon: Lollie Sails, MD;  Location: [facility] ENDOSCOPY;  Service: Endoscopy;  Laterality: N/A;  . TUBAL LIGATION      There were no vitals filed for this visit.   Subjective Assessment - 01/06/21 1107    Subjective Pt reports 60% improvement overall in Rt shoulder with surgery and PT.  Pt has intermittent pain in Rt shoulder pain at night but is mostly painfree during the day.  Pt is using her Rt arm for ADLs and light household tasks.    Pertinent History gentle  functional IR behind back, NO UBE AT ANY PHASE PER PROTOCOL    Limitations Lifting;House hold activities;Other (comment)    Patient Stated Goals laundry, dishes, cooking    Currently in Pain? No/denies              [facility] PT Assessment - 01/06/21 0001      Assessment   Medical Diagnosis s/p Right reverse TSA    Referring Provider (PT) Ervin Knack Ewing, PA    Onset Date/Surgical Date 09/17/20    Prior Therapy not for shoulder      Observation/Other Assessments   Focus on Therapeutic Outcomes (FOTO)  Improved from 32% to 61%      AROM   Overall AROM Comments IR to iliac crest using pulleys (AA/ROM)    Right Shoulder Flexion 125 Degrees    Right Shoulder ABduction 90 Degrees    Right Shoulder External Rotation 45 Degrees      Strength   Strength Assessment Site Shoulder    Right/Left Shoulder Right    Right Shoulder Flexion 4/5    Right Shoulder Extension 4/5    Right Shoulder ABduction 4-/5    Right Shoulder Internal Rotation 4/5    Right Shoulder External Rotation 4-/5                         [facility] Adult PT  Treatment/Exercise - 01/06/21 0001      Shoulder Exercises: Supine   Flexion Strengthening;Both;20 reps;Weights    Shoulder Flexion Weight (lbs) 2      Shoulder Exercises: Seated   External Rotation AROM;10 reps    External Rotation Limitations hand reach with wide elbow to touch back of head x 15 reps      Shoulder Exercises: Sidelying   External Rotation Strengthening;Right;10 reps;Weights    External Rotation Weight (lbs) 1    ABduction Strengthening;Right;10 reps;Weights    ABduction Weight (lbs) 3      Shoulder Exercises: Pulleys   Flexion 3 minutes                    PT Short Term Goals - 01/06/21 1059      PT SHORT TERM GOAL #1   Title Pt will be ind with initial HEP following protocol for reverse TSA with pain not to exceed 3/10.    Status Achieved      PT SHORT TERM GOAL #2   Title Pt will report pain level </= 3/10  with incorporation of Rt UE for basic ADLs.    Status Achieved      PT SHORT TERM GOAL #3   Title Pt will achieve AA/ROM or A/ROM to at least 110 deg elevation and 30 deg ER.    Status Achieved      PT SHORT TERM GOAL #4   Title Pt will demo submax isometric of all heads of deltoid without pain.    Status Achieved             PT Long Term Goals - 01/06/21 1059      PT LONG TERM GOAL #1   Title Pt will achieve Rt shoulder A/ROM of 110+ elevation, 40 ER, and functional IR to at least L5 for improved reaching tasks and ADLs such as dressing and bathing.      PT LONG TERM GOAL #2   Title Pt will improve Rt functional shoulder strength for incorporation of Rt UE use for light household tasks such as cleaning, cooking, and laundry with 25lb lifting restriction.    Time 6    Period Weeks    Status On-going    Target Date 02/17/21      PT LONG TERM GOAL #3   Title FOTO score improved from 32% limitation to 50% to demo improved functional use of Rt shoulder    Baseline 61%    Status Achieved      PT LONG TERM GOAL #4   Title Pt will be able to return to household and short distance community ambulation with rollator with good stability of Rt UE and min/no pain in Rt shoulder    Status Achieved                 Plan - 01/06/21 1110    Clinical Impression Statement Pt is making ongoing progress toward LTGs following Rt reverse total shoulder replacement.  She made slow initial gains following 1 month in SNF immediately post-op.  She was non-ambulatory and max assist of 2 for transfers when she started OPPT.  She is now min assist to no assist with sit to stand and ambulates safely using RW.  She is using Rt UE throuhgout the day for ADLs and light household tasks like laundry.  She has some mild pain in Rt anterior shoulder at night but is generally painfree during the day.  She is compliant with HEP.  A/ROM of Rt shoulder continues to improve with elevation of 125 deg with min  substitution and no pain.  ER has improved to 45 deg in supine but Pt has great difficulty activating and ranging into ER in upright postures.  IR AA/ROM is to iliac crest.  She will benefit from continued skilled PT to work on furthering ROM, strength and endurance to gain optimal functional levels of use of Rt UE post-operatively.    Personal Factors and Comorbidities Age;Comorbidity 1;Comorbidity 2;Comorbidity 3+;Time since onset of injury/illness/exacerbation    Comorbidities falls, CVA, CML, DM, obesity    Examination-Activity Limitations Bed Mobility;Transfers;Locomotion Level;Stand;Lift;Dressing;Carry;Bathing;Toileting    Examination-Participation Restrictions Community Activity;Cleaning;Meal Prep;Laundry;Shop    Rehab Potential Good    PT Frequency 2x / week    PT Duration 6 weeks    PT Treatment/Interventions ADLs/Self Care Home Management;Cryotherapy;Electrical Stimulation;Moist Heat;DME Instruction;Gait training;Therapeutic exercise;Therapeutic activities;Functional mobility training;Neuromuscular re-education;Patient/family education;Manual techniques;Passive range of motion;Wheelchair mobility training    PT Next Visit Plan continue functional ER, IR, flexion for strength, ROM and endurance, lifting as able    PT Home Exercise Plan Access Code: [email or web address]    Consulted and Agree with Plan of Care Patient           Patient will benefit from skilled therapeutic intervention in order to improve the following deficits and impairments:  Abnormal gait,Decreased range of motion,Difficulty walking,Obesity,Decreased activity tolerance,Pain,Postural dysfunction,Decreased strength,Increased edema,Decreased balance,Hypomobility  Visit Diagnosis: Chronic right shoulder pain - Plan: PT plan of care cert/re-cert  Muscle weakness (generalized) - Plan: PT plan of care cert/re-cert  Abnormal posture - Plan: PT plan of care cert/re-cert  Stiffness of right shoulder, not elsewhere classified - Plan:  PT plan of care cert/re-cert  Difficulty in walking, not elsewhere classified - Plan: PT plan of care cert/re-cert     Problem List Patient Active Problem List   Diagnosis Date Noted  . Allergic rhinitis due to pollen 08/23/2018  . DDD (degenerative disc disease), lumbar 08/23/2018  . Lumbar neuritis 08/23/2018  . 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  . GERD (gastroesophageal reflux disease) 08/23/2018  . Morbid obesity ([place]) 08/23/2018  . Obstructive sleep apnea 08/23/2018  . Intervertebral disc disorder with radiculopathy of lumbar region 08/23/2018  . CML (chronic myelocytic leukemia) ([place]) 08/23/2018  . Hx of myocardial perfusion scan 08/23/2018  . Osteoarthritis 08/23/2018  . Degenerative arthritis of hip 08/23/2018  . Lumbar spondylosis 08/23/2018  . SOB (shortness of breath) 08/23/2018  . History of CVA (cerebrovascular accident) 08/23/2018  . Essential hypertension 08/23/2018  . Chronic right shoulder pain 08/23/2018  . Complete tear of right rotator cuff 08/23/2018  . Acute pain of left shoulder 08/23/2018  . On antineoplastic chemotherapy 08/23/2018  . Hyperlipidemia 08/23/2018  . Neurogenic claudication due to lumbar spinal stenosis 08/23/2018  . Vitamin D deficiency 08/23/2018    Alene Mires Burna Atlas 01/06/2021, 12:36 PM  [facility] [address] [place], [place], [address] Phone: [phone]   Fax:  [phone]  Name: Pollyann Roa MRN: [id number] Date of Birth: August 09, 1941

## 2021-01-14 ENCOUNTER — Other Ambulatory Visit: Payer: Self-pay

## 2021-01-14 ENCOUNTER — Encounter: Payer: Self-pay | Admitting: Physical Therapy

## 2021-01-14 ENCOUNTER — Ambulatory Visit: Payer: Medicare HMO | Admitting: Physical Therapy

## 2021-01-14 DIAGNOSIS — M25611 Stiffness of right shoulder, not elsewhere classified: Secondary | ICD-10-CM

## 2021-01-14 DIAGNOSIS — M25511 Pain in right shoulder: Secondary | ICD-10-CM

## 2021-01-14 DIAGNOSIS — R293 Abnormal posture: Secondary | ICD-10-CM

## 2021-01-14 DIAGNOSIS — G8929 Other chronic pain: Secondary | ICD-10-CM

## 2021-01-14 DIAGNOSIS — M6281 Muscle weakness (generalized): Secondary | ICD-10-CM

## 2021-01-14 NOTE — Therapy (Addendum)
[facility] [address] [place], [place], [address] Phone: [phone]   Fax:  [phone]  Physical Therapy Treatment  Patient Details  Name: Pamela Cox MRN: [id number] Date of Birth: [birth year] Referring Provider (PT): Ervin Knack Ewing, PA  Progress Note Reporting Period 12/10/20 to 01/14/21  See note below for Objective Data and Assessment of Progress/Goals.      Encounter Date: 01/14/2021   PT End of Session - 01/14/21 1356    Visit Number 20    Date for PT Re-Evaluation 03/03/21    Authorization Type A[employer]na Medicare    Progress Note Due on Visit 20    PT Start Time 1355    PT Stop Time 1440    PT Time Calculation (min) 45 min    Activity Tolerance Patient tolerated treatment well    Behavior During Therapy WFL for tasks assessed/performed           Past Medical History:  Diagnosis Date  . Arthritis   . CML (chronic myelocytic leukemia) ([place])   . CVA (cerebral vascular accident) ([place])   . Diabetes mellitus without complication ([place])   . GERD (gastroesophageal reflux disease)   . Hypertension   . Myelocytic leukemia, chronic ([place])   . Obesity   . Sleep apnea     Past Surgical History:  Procedure Laterality Date  . APPENDECTOMY    . COLONOSCOPY    . COLONOSCOPY WITH PROPOFOL N/A 07/16/2019   Procedure: COLONOSCOPY WITH PROPOFOL;  Surgeon: Lollie Sails, MD;  Location: [facility] ENDOSCOPY;  Service: Endoscopy;  Laterality: N/A;  . ESOPHAGOGASTRODUODENOSCOPY (EGD) WITH PROPOFOL N/A 07/16/2019   Procedure: ESOPHAGOGASTRODUODENOSCOPY (EGD) WITH PROPOFOL;  Surgeon: Lollie Sails, MD;  Location: [facility] ENDOSCOPY;  Service: Endoscopy;  Laterality: N/A;  . TUBAL LIGATION      There were no vitals filed for this visit.   Subjective Assessment - 01/14/21 1358    Subjective No pain, shoulder doing well.    Patient is accompained by: Family member    Currently in Pain? No/denies    Multiple Pain  Sites No              [facility] PT Assessment - 01/14/21 0001      Assessment   Medical Diagnosis s/p Right reverse TSA    Referring Provider (PT) Ervin Knack Ewing, PA    Onset Date/Surgical Date 09/17/20      Observation/Other Assessments   Focus on Therapeutic Outcomes (FOTO)  Improved from 32% to 61%      AROM   Right Shoulder Flexion 125 Degrees    Right Shoulder ABduction 90 Degrees    Right Shoulder Internal Rotation --   Reaches to iliac crest via pulleys   Right Shoulder External Rotation 45 Degrees   Can touch her bun with elbow out                        [facility] Adult PT Treatment/Exercise - 01/14/21 0001      Shoulder Exercises: Supine   Flexion Strengthening;Both;20 reps;Weights   3x10   Shoulder Flexion Weight (lbs) 2      Shoulder Exercises: Seated   External Rotation AROM;Right;20 reps    External Rotation Limitations hand reach with wide elbow to touch back of head x 15 reps    Flexion --   punches forward with min PTA support 3x10   Other Seated Exercises 1 1/2 wt on cane; pretend to put  cane "on the shelf" 10x2    Other Seated Exercises 1/2 jumping jacks 2x10      Shoulder Exercises: Sidelying   External Rotation Strengthening;Right;20 reps;Weights   2x10   External Rotation Weight (lbs) 1    External Rotation Limitations PTA to asssit    ABduction Strengthening;Right;20 reps;Weights   2x10   ABduction Weight (lbs) 3      Shoulder Exercises: Standing   Row --   Green seated 2x10, blue 2x10 seated Vc for control     Shoulder Exercises: Pulleys   Flexion 3 minutes    Scaption 3 minutes      Manual Therapy   Soft tissue mobilization addaday assit to RT perscapular musculature and Rt upper arm post TE                    PT Short Term Goals - 01/06/21 1059      PT SHORT TERM GOAL #1   Title Pt will be ind with initial HEP following protocol for reverse TSA with pain not to exceed 3/10.    Status Achieved      PT SHORT TERM  GOAL #2   Title Pt will report pain level </= 3/10 with incorporation of Rt UE for basic ADLs.    Status Achieved      PT SHORT TERM GOAL #3   Title Pt will achieve AA/ROM or A/ROM to at least 110 deg elevation and 30 deg ER.    Status Achieved      PT SHORT TERM GOAL #4   Title Pt will demo submax isometric of all heads of deltoid without pain.    Status Achieved             PT Long Term Goals - 01/14/21 1441      PT LONG TERM GOAL #1   Title Pt will achieve Rt shoulder A/ROM of 110+ elevation, 40 ER, and functional IR to at least L5 for improved reaching tasks and ADLs such as dressing and bathing.    Baseline met for flexion and functional IR, good progress on ER    Time 8    Period Weeks    Status Partially Met      PT LONG TERM GOAL #2   Title Pt will improve Rt functional shoulder strength for incorporation of Rt UE use for light household tasks such as cleaning, cooking, and laundry with 25lb lifting restriction.    Time 6    Period Weeks    Status On-going   Has difficulty dressing herself independently     PT LONG TERM GOAL #3   Title FOTO score improved from 32% limitation to 50% to demo improved functional use of Rt shoulder    Baseline 61%    Time 12    Period Weeks    Status Achieved      PT LONG TERM GOAL #4   Title Pt will be able to return to household and short distance community ambulation with rollator with good stability of Rt UE and min/no pain in Rt shoulder    Time 12    Period Weeks    Status Achieved                 Plan - 01/14/21 1357    Clinical Impression Statement Pt arrives painfree in her shoulder. Pt participated in Rt shoulder strengthening exercises in a variety of positions. Pt's AROM much improved overall, essentially the same as last measurement. Pt  reports she has great difficulty dressing herself but feels bad asking for too much help from family. Pt had no pain with theraputic exercise and only required min A to get out  the chair from PTA 1x, all other transfers were independent.    Personal Factors and Comorbidities Age;Comorbidity 1;Comorbidity 2;Comorbidity 3+;Time since onset of injury/illness/exacerbation    Comorbidities falls, CVA, CML, DM, obesity    Examination-Activity Limitations Bed Mobility;Transfers;Locomotion Level;Stand;Lift;Dressing;Carry;Bathing;Toileting    Examination-Participation Restrictions Community Activity;Cleaning;Meal Prep;Laundry;Shop    Stability/Clinical Decision Making Evolving/Moderate complexity    Rehab Potential Good    PT Frequency 2x / week    PT Duration 6 weeks    PT Treatment/Interventions ADLs/Self Care Home Management;Cryotherapy;Electrical Stimulation;Moist Heat;DME Instruction;Gait training;Therapeutic exercise;Therapeutic activities;Functional mobility training;Neuromuscular re-education;Patient/family education;Manual techniques;Passive range of motion;Wheelchair mobility training    PT Next Visit Plan continue functional ER, IR, flexion for strength, ROM and endurance, lifting as able    PT Home Exercise Plan Access Code: [email or web address]    Consulted and Agree with Plan of Care Patient           Patient will benefit from skilled therapeutic intervention in order to improve the following deficits and impairments:  Abnormal gait,Decreased range of motion,Difficulty walking,Obesity,Decreased activity tolerance,Pain,Postural dysfunction,Decreased strength,Increased edema,Decreased balance,Hypomobility  Visit Diagnosis: Chronic right shoulder pain  Muscle weakness (generalized)  Abnormal posture  Stiffness of right shoulder, not elsewhere classified     Problem List Patient Active Problem List   Diagnosis Date Noted  . Allergic rhinitis due to pollen 08/23/2018  . DDD (degenerative disc disease), lumbar 08/23/2018  . Lumbar neuritis 08/23/2018  . 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  . GERD (gastroesophageal reflux disease) 08/23/2018  . Morbid obesity ([place])  08/23/2018  . Obstructive sleep apnea 08/23/2018  . Intervertebral disc disorder with radiculopathy of lumbar region 08/23/2018  . CML (chronic myelocytic leukemia) ([place]) 08/23/2018  . Hx of myocardial perfusion scan 08/23/2018  . Osteoarthritis 08/23/2018  . Degenerative arthritis of hip 08/23/2018  . Lumbar spondylosis 08/23/2018  . SOB (shortness of breath) 08/23/2018  . History of CVA (cerebrovascular accident) 08/23/2018  . Essential hypertension 08/23/2018  . Chronic right shoulder pain 08/23/2018  . Complete tear of right rotator cuff 08/23/2018  . Acute pain of left shoulder 08/23/2018  . On antineoplastic chemotherapy 08/23/2018  . Hyperlipidemia 08/23/2018  . Neurogenic claudication due to lumbar spinal stenosis 08/23/2018  . Vitamin D deficiency 08/23/2018    COCHRAN,JENNIFER, PTA 01/14/2021, 2:46 PM  Baruch Merl, PT 01/19/21 4:38 PM   [facility] [address], [place], [place], [address] Phone: [phone]   Fax:  [phone]  Name: Heela Heishman MRN: [id number] Date of Birth: 1941-05-31

## 2021-01-21 ENCOUNTER — Ambulatory Visit: Payer: Medicare HMO | Attending: Physician Assistant | Admitting: Physical Therapy

## 2021-01-21 ENCOUNTER — Other Ambulatory Visit: Payer: Self-pay

## 2021-01-21 ENCOUNTER — Encounter: Payer: Self-pay | Admitting: Physical Therapy

## 2021-01-21 DIAGNOSIS — G8929 Other chronic pain: Secondary | ICD-10-CM | POA: Insufficient documentation

## 2021-01-21 DIAGNOSIS — M6281 Muscle weakness (generalized): Secondary | ICD-10-CM | POA: Insufficient documentation

## 2021-01-21 DIAGNOSIS — R293 Abnormal posture: Secondary | ICD-10-CM | POA: Insufficient documentation

## 2021-01-21 DIAGNOSIS — M25611 Stiffness of right shoulder, not elsewhere classified: Secondary | ICD-10-CM | POA: Insufficient documentation

## 2021-01-21 DIAGNOSIS — M25511 Pain in right shoulder: Secondary | ICD-10-CM | POA: Insufficient documentation

## 2021-01-21 NOTE — Therapy (Signed)
[facility] [address], [place], [place], [address] Phone: [phone]   Fax:  [phone]  Physical Therapy Treatment  Patient Details  Name: Pamela Cox MRN: [id number] Date of Birth: 02-Jan-1941 Referring Provider (PT): Ervin Knack Ewing, PA   Encounter Date: 01/21/2021   PT End of Session - 01/21/21 0849    Visit Number 21    Date for PT Re-Evaluation 03/03/21    Authorization Type Aetna Medicare    Progress Note Due on Visit 30    PT Start Time [phone]    PT Stop Time 0927    PT Time Calculation (min) 44 min    Activity Tolerance Patient tolerated treatment well    Behavior During Therapy [facility] for tasks assessed/performed           Past Medical History:  Diagnosis Date  . Arthritis   . CML (chronic myelocytic leukemia) ([place])   . CVA (cerebral vascular accident) ([place])   . Diabetes mellitus without complication ([place])   . GERD (gastroesophageal reflux disease)   . Hypertension   . Myelocytic leukemia, chronic ([place])   . Obesity   . Sleep apnea     Past Surgical History:  Procedure Laterality Date  . APPENDECTOMY    . COLONOSCOPY    . COLONOSCOPY WITH PROPOFOL N/A 07/16/2019   Procedure: COLONOSCOPY WITH PROPOFOL;  Surgeon: Lollie Sails, MD;  Location: [facility] ENDOSCOPY;  Service: Endoscopy;  Laterality: N/A;  . ESOPHAGOGASTRODUODENOSCOPY (EGD) WITH PROPOFOL N/A 07/16/2019   Procedure: ESOPHAGOGASTRODUODENOSCOPY (EGD) WITH PROPOFOL;  Surgeon: Lollie Sails, MD;  Location: [facility] ENDOSCOPY;  Service: Endoscopy;  Laterality: N/A;  . TUBAL LIGATION      There were no vitals filed for this visit.   Subjective Assessment - 01/21/21 0848    Subjective The hardest thing is raising my Rt arm overhead b/c it hurts my back.    Pertinent History gentle functional IR behind back, NO UBE AT ANY PHASE PER PROTOCOL    Limitations Lifting;House hold activities;Other (comment)    Patient Stated Goals laundry,  dishes, cooking    Currently in Pain? No/denies    Aggravating Factors  raising my arm overhead - hurts my back                             [facility] Adult PT Treatment/Exercise - 01/21/21 0001      Therapeutic Activites    Therapeutic Activities Other Therapeutic Activities    Other Therapeutic Activities reach Rt hand to tap overhead shelf 2x10, limited by back pain      Exercises   Exercises Shoulder;Elbow      Lumbar Exercises: Aerobic   Nustep L1 x 8' PT present to monitor and increase resistance to L2 at 4'      Shoulder Exercises: Seated   Row Strengthening;Both;Theraband;20 reps    Theraband Level (Shoulder Row) Level 4 (Blue)    Row Limitations chair + black pad    External Rotation AROM;Right;20 reps    External Rotation Limitations arm propped on cart, elbow 90 deg, lift forearm like "waving to someone", 20 reps touch back of head wide elbow path for functional ER    Flexion AROM;Strengthening;Right;Weights;10 reps;5 reps    Flexion Weight (lbs) 1    Abduction Right;AROM;20 reps    ABduction Limitations elbow 90 deg, lift off cart    Other Seated Exercises jumping jacks to 90 deg x 20 bil  UEs      Shoulder Exercises: Standing   Other Standing Exercises AA/ROM IR PT assist 10x5 sec holds      Shoulder Exercises: Pulleys   Flexion 3 minutes    ABduction 2 minutes                    PT Short Term Goals - 01/06/21 1059      PT SHORT TERM GOAL #1   Title Pt will be ind with initial HEP following protocol for reverse TSA with pain not to exceed 3/10.    Status Achieved      PT SHORT TERM GOAL #2   Title Pt will report pain level </= 3/10 with incorporation of Rt UE for basic ADLs.    Status Achieved      PT SHORT TERM GOAL #3   Title Pt will achieve AA/ROM or A/ROM to at least 110 deg elevation and 30 deg ER.    Status Achieved      PT SHORT TERM GOAL #4   Title Pt will demo submax isometric of all heads of deltoid without pain.     Status Achieved             PT Long Term Goals - 01/14/21 1441      PT LONG TERM GOAL #1   Title Pt will achieve Rt shoulder A/ROM of 110+ elevation, 40 ER, and functional IR to at least L5 for improved reaching tasks and ADLs such as dressing and bathing.    Baseline met for flexion and functional IR, good progress on ER    Time 8    Period Weeks    Status Partially Met      PT LONG TERM GOAL #2   Title Pt will improve Rt functional shoulder strength for incorporation of Rt UE use for light household tasks such as cleaning, cooking, and laundry with 25lb lifting restriction.    Time 6    Period Weeks    Status On-going   Has difficulty dressing herself independently     PT LONG TERM GOAL #3   Title FOTO score improved from 32% limitation to 50% to demo improved functional use of Rt shoulder    Baseline 61%    Time 12    Period Weeks    Status Achieved      PT LONG TERM GOAL #4   Title Pt will be able to return to household and short distance community ambulation with rollator with good stability of Rt UE and min/no pain in Rt shoulder    Time 12    Period Weeks    Status Achieved                 Plan - 01/21/21 0927    Clinical Impression Statement Pt arrives painfree.  She is most limited in overhead reaching seocndary to back pain with this but is able to achieve reaching to 125 deg.  She uses momentum as she fatigues.    She is working on abd and ER ROM and strength in gravity.  She continues to be safe and ind with transfers and gait.  She performed 8' on NuStep today for endurnace and ROM.  Continue along POC.    Comorbidities falls, CVA, CML, DM, obesity    PT Next Visit Plan continue functional ER, IR, flexion for strength, ROM and endurance, lifting as able    PT Home Exercise Plan Access Code: [email or web address]    [email or web address]  and Agree with Plan of Care Patient           Patient will benefit from skilled therapeutic intervention in order to improve the following  deficits and impairments:     Visit Diagnosis: Chronic right shoulder pain  Muscle weakness (generalized)  Abnormal posture  Stiffness of right shoulder, not elsewhere classified     Problem List Patient Active Problem List   Diagnosis Date Noted  . Allergic rhinitis due to pollen 08/23/2018  . DDD (degenerative disc disease), lumbar 08/23/2018  . Lumbar neuritis 08/23/2018  . 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  . GERD (gastroesophageal reflux disease) 08/23/2018  . Morbid obesity ([place]) 08/23/2018  . Obstructive sleep apnea 08/23/2018  . Intervertebral disc disorder with radiculopathy of lumbar region 08/23/2018  . CML (chronic myelocytic leukemia) ([place]) 08/23/2018  . Hx of myocardial perfusion scan 08/23/2018  . Osteoarthritis 08/23/2018  . Degenerative arthritis of hip 08/23/2018  . Lumbar spondylosis 08/23/2018  . SOB (shortness of breath) 08/23/2018  . History of CVA (cerebrovascular accident) 08/23/2018  . Essential hypertension 08/23/2018  . Chronic right shoulder pain 08/23/2018  . Complete tear of right rotator cuff 08/23/2018  . Acute pain of left shoulder 08/23/2018  . On antineoplastic chemotherapy 08/23/2018  . Hyperlipidemia 08/23/2018  . Neurogenic claudication due to lumbar spinal stenosis 08/23/2018  . Vitamin D deficiency 08/23/2018    Alene Mires Weltha Cathy 01/21/2021, 9:30 AM  [facility] [address], [place], [place], [address] Phone: [phone]   Fax:  [phone]  Name: Brittny Spangle MRN: [id number] Date of Birth: [birth year]

## 2021-01-23 ENCOUNTER — Ambulatory Visit: Payer: Medicare HMO | Admitting: Physical Therapy

## 2021-01-23 ENCOUNTER — Encounter: Payer: Self-pay | Admitting: Physical Therapy

## 2021-01-23 ENCOUNTER — Other Ambulatory Visit: Payer: Self-pay

## 2021-01-23 DIAGNOSIS — M6281 Muscle weakness (generalized): Secondary | ICD-10-CM

## 2021-01-23 DIAGNOSIS — G8929 Other chronic pain: Secondary | ICD-10-CM

## 2021-01-23 DIAGNOSIS — R293 Abnormal posture: Secondary | ICD-10-CM

## 2021-01-23 DIAGNOSIS — M25511 Pain in right shoulder: Secondary | ICD-10-CM | POA: Diagnosis not present

## 2021-01-23 DIAGNOSIS — M25611 Stiffness of right shoulder, not elsewhere classified: Secondary | ICD-10-CM

## 2021-01-23 NOTE — Therapy (Signed)
[facility] [address] [place], [place], [address] Phone: [phone]   Fax:  [phone]  Physical Therapy Treatment  Patient Details  Name: Pamela Cox MRN: [id number] Date of Birth: November 06, 1941 Referring Provider (PT): Ervin Knack Ewing, PA   Encounter Date: 01/23/2021   PT End of Session - 01/23/21 0814    Visit Number 22    Date for PT Re-Evaluation 02/17/21    Authorization Type Aetna Medicare    Progress Note Due on Visit 14    PT Start Time 0810   Pt late   PT Stop Time 0845    PT Time Calculation (min) 35 min    Activity Tolerance Patient tolerated treatment well    Behavior During Therapy [facility] for tasks assessed/performed           Past Medical History:  Diagnosis Date  . Arthritis   . CML (chronic myelocytic leukemia) ([place])   . CVA (cerebral vascular accident) ([place])   . Diabetes mellitus without complication ([place])   . GERD (gastroesophageal reflux disease)   . Hypertension   . Myelocytic leukemia, chronic ([place])   . Obesity   . Sleep apnea     Past Surgical History:  Procedure Laterality Date  . APPENDECTOMY    . COLONOSCOPY    . COLONOSCOPY WITH PROPOFOL N/A 07/16/2019   Procedure: COLONOSCOPY WITH PROPOFOL;  Surgeon: Lollie Sails, MD;  Location: [facility] ENDOSCOPY;  Service: Endoscopy;  Laterality: N/A;  . ESOPHAGOGASTRODUODENOSCOPY (EGD) WITH PROPOFOL N/A 07/16/2019   Procedure: ESOPHAGOGASTRODUODENOSCOPY (EGD) WITH PROPOFOL;  Surgeon: Lollie Sails, MD;  Location: [facility] ENDOSCOPY;  Service: Endoscopy;  Laterality: N/A;  . TUBAL LIGATION      There were no vitals filed for this visit.   Subjective Assessment - 01/23/21 0814    Subjective My low back isn't bothering me as much today.  Shoulder is doing well - no pain.  The biggest problem I have is reaching up b/c it hurts my back.    Pertinent History gentle functional IR behind back, NO UBE AT ANY PHASE PER PROTOCOL     Limitations Lifting;House hold activities;Other (comment)    Patient Stated Goals laundry, dishes, cooking    Currently in Pain? No/denies              [facility] PT Assessment - 01/23/21 0001      PROM   Right Shoulder Flexion 150 Degrees    Right Shoulder Internal Rotation 47 Degrees    Right Shoulder External Rotation 42 Degrees                         [facility] Adult PT Treatment/Exercise - 01/23/21 0001      Exercises   Exercises Shoulder;Elbow      Shoulder Exercises: Supine   External Rotation Limitations passive stretch in supine/scaption 3x30 sec    Flexion AAROM;5 reps    Flexion Limitations dowel, hold 10 sec for stretch      Shoulder Exercises: ROM/Strengthening   Nustep L1 x 8' PT present to discuss progress and functional limitations      Manual Therapy   Manual Therapy Soft tissue mobilization    Soft tissue mobilization pectorals and scar massage on Rt, Rt intrascapular and upper trap deep tissue massage                    PT Short Term Goals - 01/06/21 1059  PT SHORT TERM GOAL #1   Title Pt will be ind with initial HEP following protocol for reverse TSA with pain not to exceed 3/10.    Status Achieved      PT SHORT TERM GOAL #2   Title Pt will report pain level </= 3/10 with incorporation of Rt UE for basic ADLs.    Status Achieved      PT SHORT TERM GOAL #3   Title Pt will achieve AA/ROM or A/ROM to at least 110 deg elevation and 30 deg ER.    Status Achieved      PT SHORT TERM GOAL #4   Title Pt will demo submax isometric of all heads of deltoid without pain.    Status Achieved             PT Long Term Goals - 01/14/21 1441      PT LONG TERM GOAL #1   Title Pt will achieve Rt shoulder A/ROM of 110+ elevation, 40 ER, and functional IR to at least L5 for improved reaching tasks and ADLs such as dressing and bathing.    Baseline met for flexion and functional IR, good progress on ER    Time 8    Period Weeks     Status Partially Met      PT LONG TERM GOAL #2   Title Pt will improve Rt functional shoulder strength for incorporation of Rt UE use for light household tasks such as cleaning, cooking, and laundry with 25lb lifting restriction.    Time 6    Period Weeks    Status On-going   Has difficulty dressing herself independently     PT LONG TERM GOAL #3   Title FOTO score improved from 32% limitation to 50% to demo improved functional use of Rt shoulder    Baseline 61%    Time 12    Period Weeks    Status Achieved      PT LONG TERM GOAL #4   Title Pt will be able to return to household and short distance community ambulation with rollator with good stability of Rt UE and min/no pain in Rt shoulder    Time 12    Period Weeks    Status Achieved                 Plan - 01/23/21 0820    Clinical Impression Statement Pt arrives with improved LBP since last visit but does report ongoing limitation of overhead reaching of Rt UE secondary to back pain with this.  She continues to have some mild tension into ER and flexion so session focused on passive stretching and STM to allow greater ease of ROM with active use of Rt UE.  Tenderness is present in Rt pectorals and upper trap.  She has P/ROM measuring on Rt shoulder 150 deg elevation, 42 ER and 47 IR without pain.  A/ROM against gravity elevation is 125 with some LBP and mild substitution, likely secondary to tightness and weakness combination.  Continue along POC with discussion of d/c possible by end of March.    Comorbidities falls, CVA, CML, DM, obesity    PT Frequency 2x / week    PT Duration 6 weeks    PT Treatment/Interventions ADLs/Self Care Home Management;Cryotherapy;Electrical Stimulation;Moist Heat;DME Instruction;Gait training;Therapeutic exercise;Therapeutic activities;Functional mobility training;Neuromuscular re-education;Patient/family education;Manual techniques;Passive range of motion;Wheelchair mobility training    PT Next  Visit Plan continue functional ER, IR, flexion for strength, ROM and endurance, lifting as able  PT Home Exercise Plan Access Code: [email or web address]           Patient will benefit from skilled therapeutic intervention in order to improve the following deficits and impairments:     Visit Diagnosis: Chronic right shoulder pain  Muscle weakness (generalized)  Abnormal posture  Stiffness of right shoulder, not elsewhere classified     Problem List Patient Active Problem List   Diagnosis Date Noted  . Allergic rhinitis due to pollen 08/23/2018  . DDD (degenerative disc disease), lumbar 08/23/2018  . Lumbar neuritis 08/23/2018  . 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  . GERD (gastroesophageal reflux disease) 08/23/2018  . Morbid obesity ([place]) 08/23/2018  . Obstructive sleep apnea 08/23/2018  . Intervertebral disc disorder with radiculopathy of lumbar region 08/23/2018  . CML (chronic myelocytic leukemia) ([place]) 08/23/2018  . Hx of myocardial perfusion scan 08/23/2018  . Osteoarthritis 08/23/2018  . Degenerative arthritis of hip 08/23/2018  . Lumbar spondylosis 08/23/2018  . SOB (shortness of breath) 08/23/2018  . History of CVA (cerebrovascular accident) 08/23/2018  . Essential hypertension 08/23/2018  . Chronic right shoulder pain 08/23/2018  . Complete tear of right rotator cuff 08/23/2018  . Acute pain of left shoulder 08/23/2018  . On antineoplastic chemotherapy 08/23/2018  . Hyperlipidemia 08/23/2018  . Neurogenic claudication due to lumbar spinal stenosis 08/23/2018  . Vitamin D deficiency 08/23/2018    Pamela Cox 01/23/2021, 8:46 AM  [facility] [address] [place], [place], [address] Phone: [phone]   Fax:  [phone]  Name: Pamela Cox MRN: [id number] Date of Birth: April 24, 1941

## 2021-01-26 ENCOUNTER — Ambulatory Visit: Payer: Medicare HMO | Admitting: Physical Therapy

## 2021-01-26 ENCOUNTER — Encounter: Payer: Self-pay | Admitting: Physical Therapy

## 2021-01-26 ENCOUNTER — Other Ambulatory Visit: Payer: Self-pay

## 2021-01-26 DIAGNOSIS — R293 Abnormal posture: Secondary | ICD-10-CM

## 2021-01-26 DIAGNOSIS — M25511 Pain in right shoulder: Secondary | ICD-10-CM

## 2021-01-26 DIAGNOSIS — G8929 Other chronic pain: Secondary | ICD-10-CM

## 2021-01-26 DIAGNOSIS — M25611 Stiffness of right shoulder, not elsewhere classified: Secondary | ICD-10-CM

## 2021-01-26 DIAGNOSIS — M6281 Muscle weakness (generalized): Secondary | ICD-10-CM

## 2021-01-26 IMAGING — CT CT CERVICAL SPINE W/O CM
5 of 8 series · 16 of 36 positions shown, 17 images · non-contrast
Comparison: Cervical spine radiograph dated 11/17/2015.

CLINICAL DATA: 78-year-old female with fall.

EXAM:
CT CERVICAL SPINE WITHOUT CONTRAST
TECHNIQUE: Multidetector CT imaging of the cervical spine was performed without
intravenous contrast. Multiplanar CT image reconstructions were also
generated.

[Series 4: c spine soft · axial · 0.38mm/px · z∈[-200,-146]mm · 2 of 83 slices shown (1 of 2)]
[im 28/83  soft-tissue]
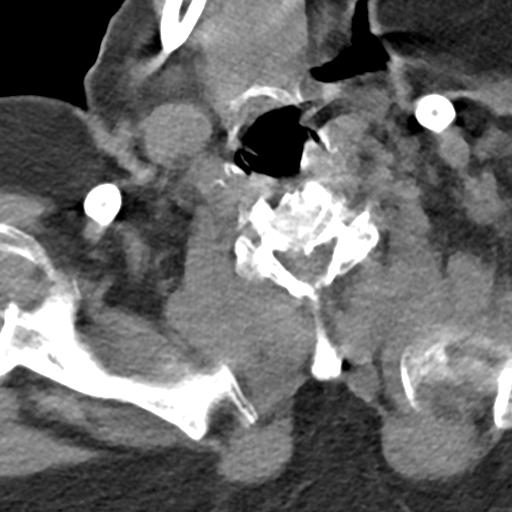
[im 55/83  soft-tissue]
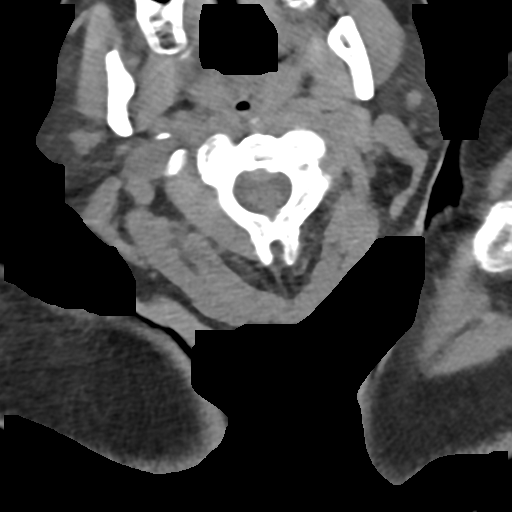

[Series 4: c spine soft · axial · 0.38mm/px · z∈[-200,-146]mm · 2 of 83 slices shown (2 of 2)]
[im 28/83  soft-tissue]
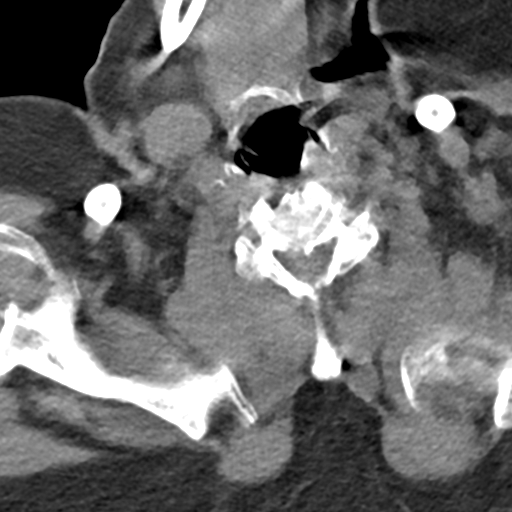
[im 55/83  soft-tissue]
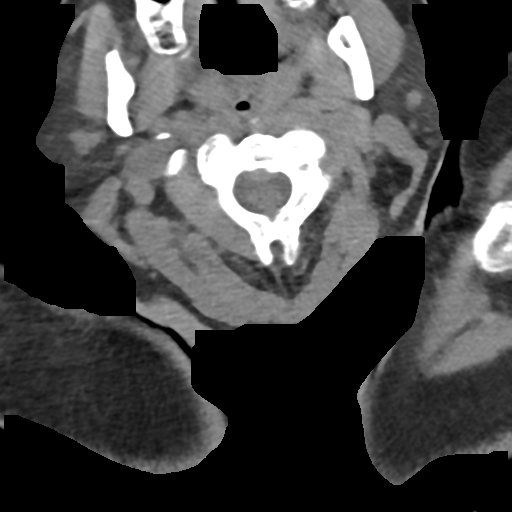

[Series 7: sag bone · sagittal · 0.28mm/px · 6 of 89 slices shown]
[im 15/89  bone]
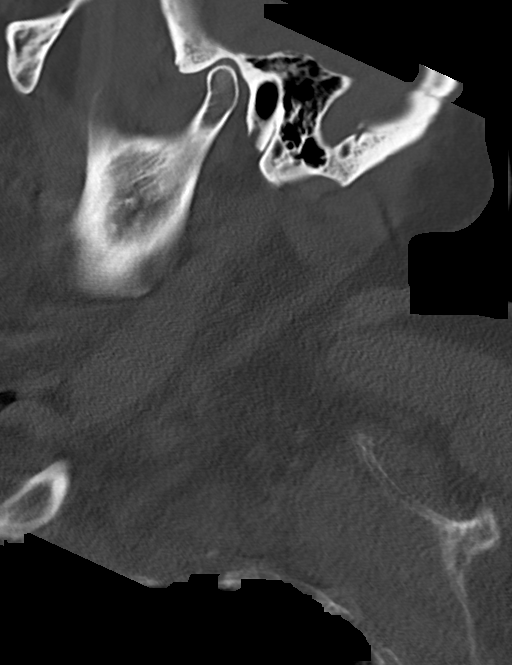
[im 30/89  bone]
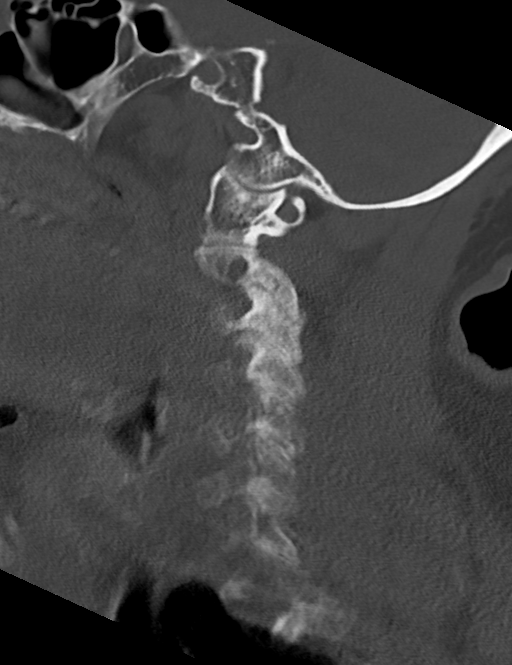
[im 33/89  soft-tissue]
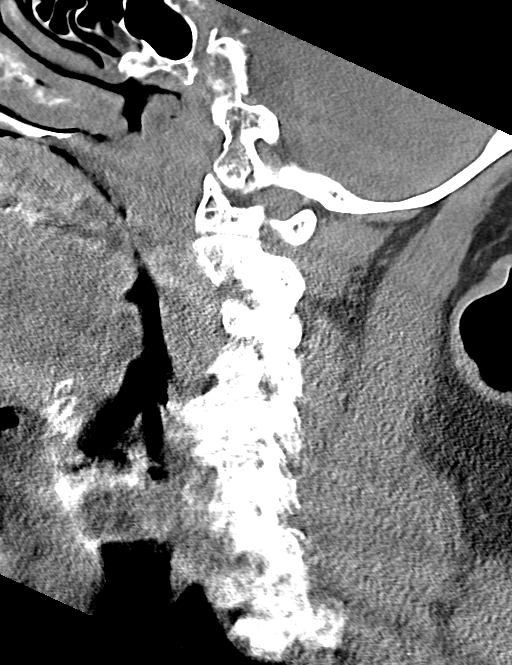
[im 45/89  bone]
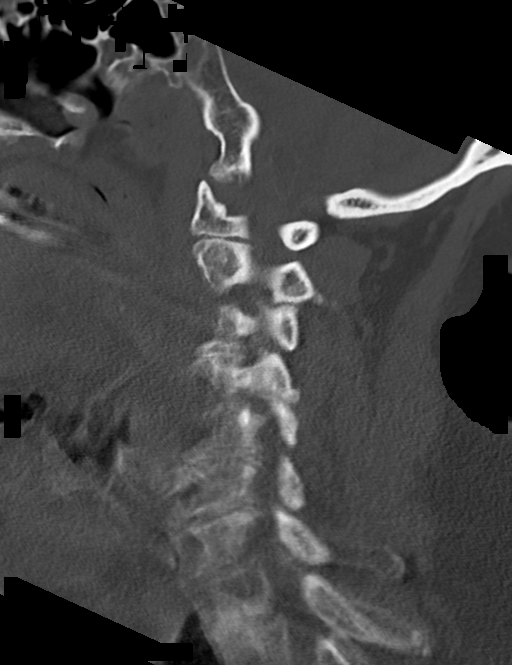
[im 59/89  bone]
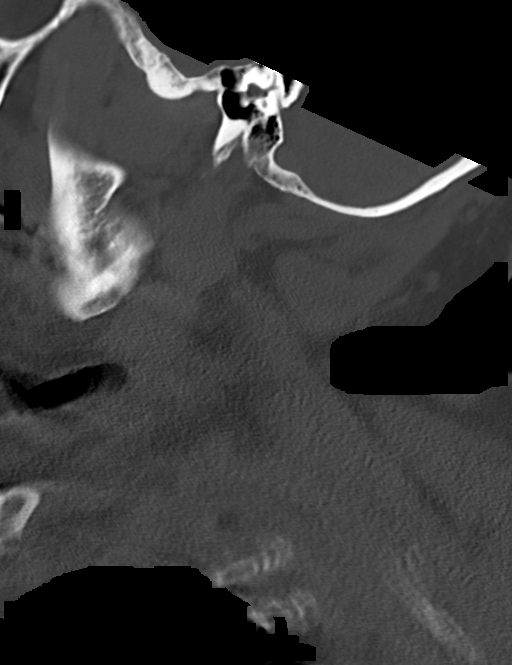
[im 74/89  bone]
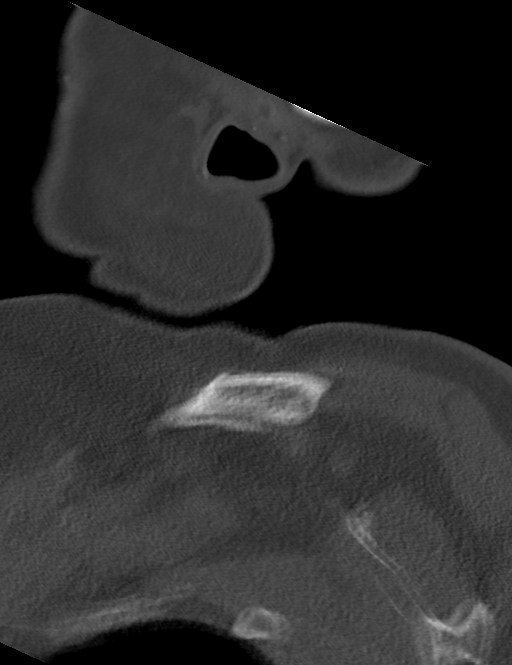

[Series 8: cor bone · coronal · 0.32mm/px · 3 of 73 slices shown]
[im 3/73  bone]
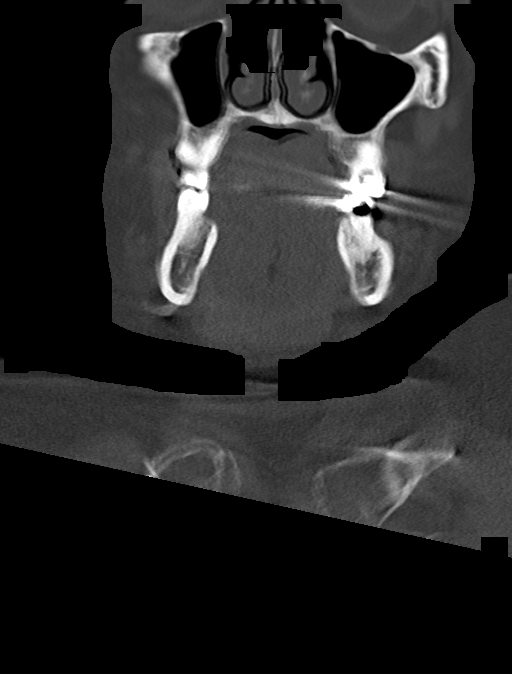
[im 37/73  bone]
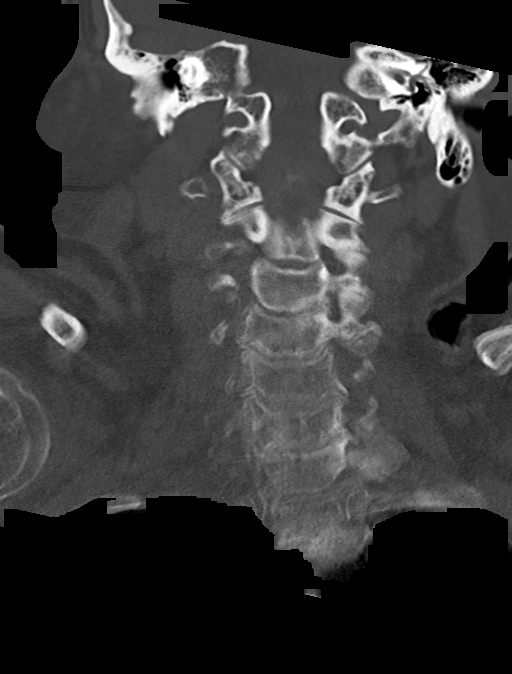
[im 71/73  bone]
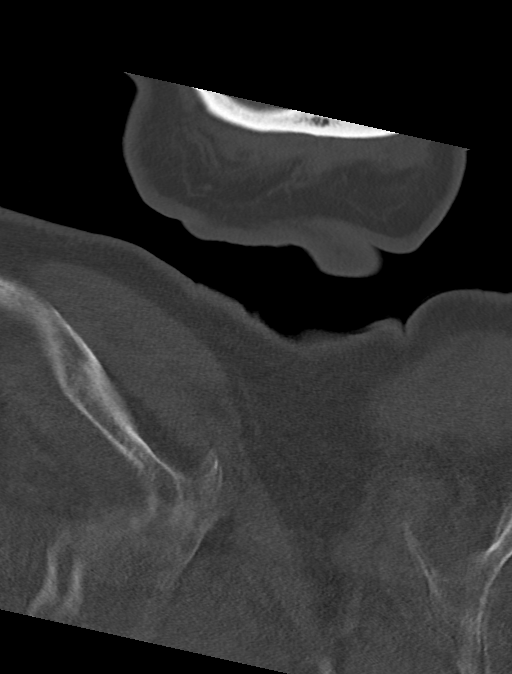

[Series 10: orthogonal axials · axial · 0.21mm/px · z∈[-257,-141]mm · 3 of 78 slices shown, 4 images]
[im 1/78  soft-tissue]
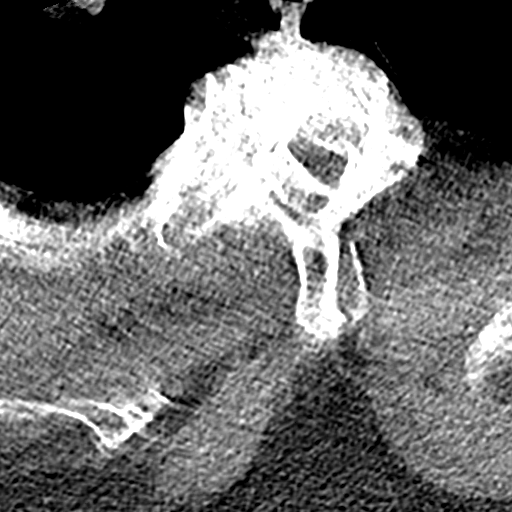
[im 1/78  bone]
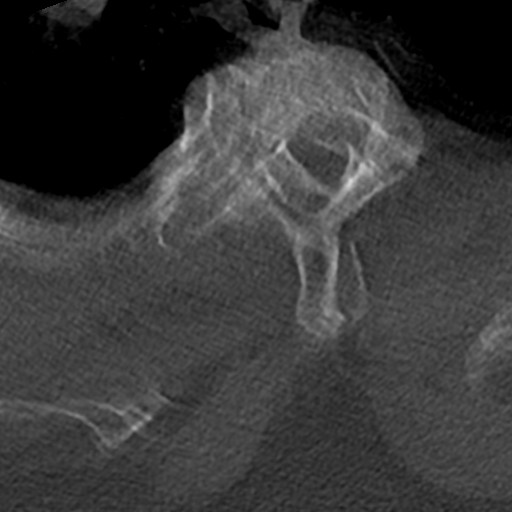
[im 39/78  bone]
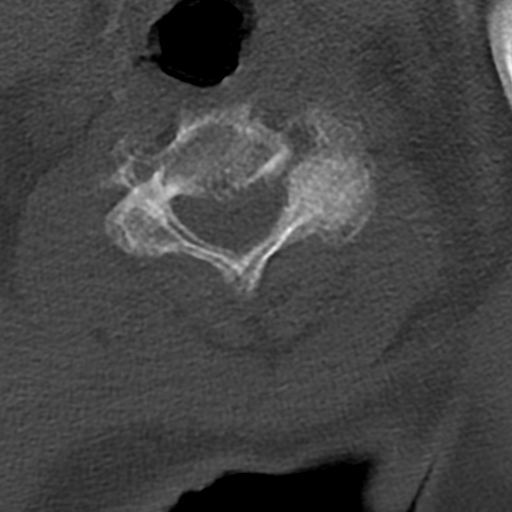
[im 78/78  bone]
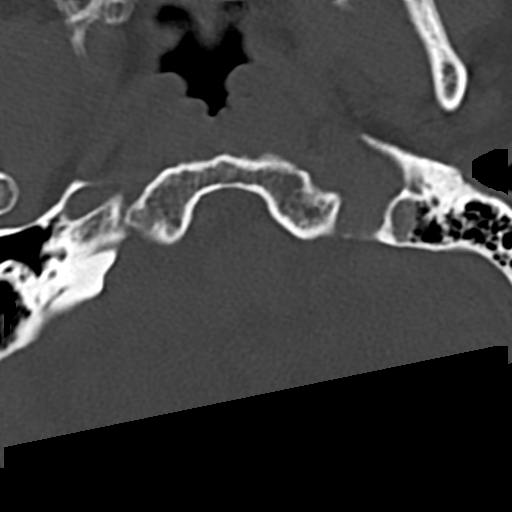

[16 of 36 positions shown; findings below may reference images not displayed]

FINDINGS: Evaluation of this exam is limited due to motion artifact.

Alignment: No acute subluxation. There is straightening of normal
cervical lordosis which may be positional or due to muscle spasm or
secondary to degenerative changes.

Skull base and vertebrae: No definite acute fracture. Evaluation
however is very limited due to severe motion. If there is high
clinical concern for acute fracture repeat CT is recommended.

Soft tissues and spinal canal: No prevertebral fluid or swelling. No
visible canal hematoma.

Disc levels: Multilevel degenerative changes with endplate
irregularity and disc space narrowing and spurring.

Upper chest: Negative.

Other: None
IMPRESSION: Very limited study due to severe motion artifact. No definite
acute/traumatic cervical spine pathology. If there is high clinical
concern for acute fracture repeat CT is recommended.

## 2021-01-26 NOTE — Therapy (Signed)
[facility] [address] [place], [place], [address] Phone: [phone]   Fax:  [phone]  Physical Therapy Treatment  Patient Details  Name: Pamela Cox MRN: [id number] Date of Birth: Apr 05, 1941 Referring Provider (PT): Ervin Knack Ewing, PA   Encounter Date: 01/26/2021   PT End of Session - 01/26/21 0853    Visit Number 23    Date for PT Re-Evaluation 02/17/21    Authorization Type [employer] Medicare    Progress Note Due on Visit 30    PT Start Time 0845    PT Stop Time 0932    PT Time Calculation (min) 47 min    Activity Tolerance Patient tolerated treatment well    Behavior During Therapy [facility] for tasks assessed/performed           Past Medical History:  Diagnosis Date  . Arthritis   . CML (chronic myelocytic leukemia) ([place])   . CVA (cerebral vascular accident) ([place])   . Diabetes mellitus without complication ([place])   . GERD (gastroesophageal reflux disease)   . Hypertension   . Myelocytic leukemia, chronic ([place])   . Obesity   . Sleep apnea     Past Surgical History:  Procedure Laterality Date  . APPENDECTOMY    . COLONOSCOPY    . COLONOSCOPY WITH PROPOFOL N/A 07/16/2019   Procedure: COLONOSCOPY WITH PROPOFOL;  Surgeon: Lollie Sails, MD;  Location[facility] ENDOSCOPY;  Service: Endoscopy;  Laterality: N/A;  . ESOPHAGOGASTRODUODENOSCOPY (EGD) WITH PROPOFOL N/A 07/16/2019   Procedure: ESOPHAGOGASTRODUODENOSCOPY (EGD) WITH PROPOFOL;  Surgeon: Lollie Sails, MD;  Location: [facility] ENDOSCOPY;  Service: Endoscopy;  Laterality: N/A;  . TUBAL LIGATION      There were no vitals filed for this visit.   Subjective Assessment - 01/26/21 0851    Subjective Shoulder is about the same.   Not bothering me much or keeping me from doing what I need to do during the day.  I am getting less back pain with Rt shoulder reaching    Pertinent History gentle functional IR behind back, NO UBE AT ANY PHASE PER PROTOCOL     Patient Stated Goals laundry, dishes, cooking    Currently in Pain? No/denies                             [facility] Adult PT Treatment/Exercise - 01/26/21 0001      Shoulder Exercises: Seated   Elevation Strengthening;AROM;Right;Weights;5 reps    Elevation Limitations Rt UE reach shoulder height,then overhead at 125 deg, alt, A/ROM then 1lb, then 2lb    Extension Strengthening;Both;20 reps;Theraband    Theraband Level (Shoulder Extension) Level 2 (Red)    Extension Limitations slow eccentric VC by PT    Row Strengthening;Both;20 reps;Theraband    Theraband Level (Shoulder Row) Level 4 (Blue)    External Rotation Strengthening;Right;Theraband;10 reps    External Rotation Limitations PT with TC throughout for form and proper activation      Shoulder Exercises: Sidelying   External Rotation Strengthening;Right;Weights;20 reps    External Rotation Weight (lbs) 1    External Rotation Limitations PT assist for form    ABduction Strengthening;Right;Weights;20 reps    ABduction Weight (lbs) 3    ABduction Limitations 2x10, 2lb then 3lb      Shoulder Exercises: Standing   Flexion AAROM;Right;20 reps    Flexion Limitations wall slides      Shoulder Exercises: ROM/Strengthening   Nustep L2 x  8' PT present to discuss status and plan for today                    PT Short Term Goals - 01/06/21 1059      PT SHORT TERM GOAL #1   Title Pt will be ind with initial HEP following protocol for reverse TSA with pain not to exceed 3/10.    Status Achieved      PT SHORT TERM GOAL #2   Title Pt will report pain level </= 3/10 with incorporation of Rt UE for basic ADLs.    Status Achieved      PT SHORT TERM GOAL #3   Title Pt will achieve AA/ROM or A/ROM to at least 110 deg elevation and 30 deg ER.    Status Achieved      PT SHORT TERM GOAL #4   Title Pt will demo submax isometric of all heads of deltoid without pain.    Status Achieved             PT Long  Term Goals - 01/14/21 1441      PT LONG TERM GOAL #1   Title Pt will achieve Rt shoulder A/ROM of 110+ elevation, 40 ER, and functional IR to at least L5 for improved reaching tasks and ADLs such as dressing and bathing.    Baseline met for flexion and functional IR, good progress on ER    Time 8    Period Weeks    Status Partially Met      PT LONG TERM GOAL #2   Title Pt will improve Rt functional shoulder strength for incorporation of Rt UE use for light household tasks such as cleaning, cooking, and laundry with 25lb lifting restriction.    Time 6    Period Weeks    Status On-going   Has difficulty dressing herself independently     PT LONG TERM GOAL #3   Title FOTO score improved from 32% limitation to 50% to demo improved functional use of Rt shoulder    Baseline 61%    Time 12    Period Weeks    Status Achieved      PT LONG TERM GOAL #4   Title Pt will be able to return to household and short distance community ambulation with rollator with good stability of Rt UE and min/no pain in Rt shoulder    Time 12    Period Weeks    Status Achieved                 Plan - 01/26/21 0936    Clinical Impression Statement Pt has gained expected ROM for reverse total shoulder and has min pain with daily use.  She is sometimes limited by back pain with use but not Rt shoulder pain.  She continues to need some TC for ER, abd, and elevation overhead reaching for form and proper recruitment of target muscle.  PT sessions now focusing on functional strength within available range.  PT and Pt and Pt's daughter discussed likelihood of d/c end of this month/cert period.  Continue maximizing functional strength for remaining visits.    Comorbidities falls, CVA, CML, DM, obesity    Rehab Potential Good    PT Frequency 2x / week    PT Duration 6 weeks    PT Treatment/Interventions ADLs/Self Care Home Management;Cryotherapy;Electrical Stimulation;Moist Heat;DME Instruction;Gait  training;Therapeutic exercise;Therapeutic activities;Functional mobility training;Neuromuscular re-education;Patient/family education;Manual techniques;Passive range of motion;Wheelchair mobility training    PT  Next Visit Plan continue functional ER, IR, flexion for strength, ROM and endurance, lifting as able    PT Home Exercise Plan Access Code: [email or web address]    Consulted and Agree with Plan of Care Patient;Family member/caregiver    Family Member Consulted Pt's daughter           Patient will benefit from skilled therapeutic intervention in order to improve the following deficits and impairments:     Visit Diagnosis: Chronic right shoulder pain  Muscle weakness (generalized)  Abnormal posture  Stiffness of right shoulder, not elsewhere classified     Problem List Patient Active Problem List   Diagnosis Date Noted  . Allergic rhinitis due to pollen 08/23/2018  . DDD (degenerative disc disease), lumbar 08/23/2018  . Lumbar neuritis 08/23/2018  . 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  . GERD (gastroesophageal reflux disease) 08/23/2018  . Morbid obesity ([place]) 08/23/2018  . Obstructive sleep apnea 08/23/2018  . Intervertebral disc disorder with radiculopathy of lumbar region 08/23/2018  . CML (chronic myelocytic leukemia) ([place]) 08/23/2018  . Hx of myocardial perfusion scan 08/23/2018  . Osteoarthritis 08/23/2018  . Degenerative arthritis of hip 08/23/2018  . Lumbar spondylosis 08/23/2018  . SOB (shortness of breath) 08/23/2018  . History of CVA (cerebrovascular accident) 08/23/2018  . Essential hypertension 08/23/2018  . Chronic right shoulder pain 08/23/2018  . Complete tear of right rotator cuff 08/23/2018  . Acute pain of left shoulder 08/23/2018  . On antineoplastic chemotherapy 08/23/2018  . Hyperlipidemia 08/23/2018  . Neurogenic claudication due to lumbar spinal stenosis 08/23/2018  . Vitamin D deficiency 08/23/2018    Alene Mires Jaylyn Booher 01/26/2021, 9:39 AM  [facility] [address] [place], [place], [address] Phone: [phone]   Fax:  [phone]  Name: Pamela Cox MRN: [id number] Date of Birth: 13-Jun-1941

## 2021-01-27 IMAGING — DX DG SHOULDER 1V*R*
1 series · 1 of 1 positions shown · non-contrast
Comparison: Prior radiograph from 03/03/2011.

CLINICAL DATA: Follow-up examination status post reduction.

EXAM:
RIGHT SHOULDER - 1 VIEW

[shoulder]
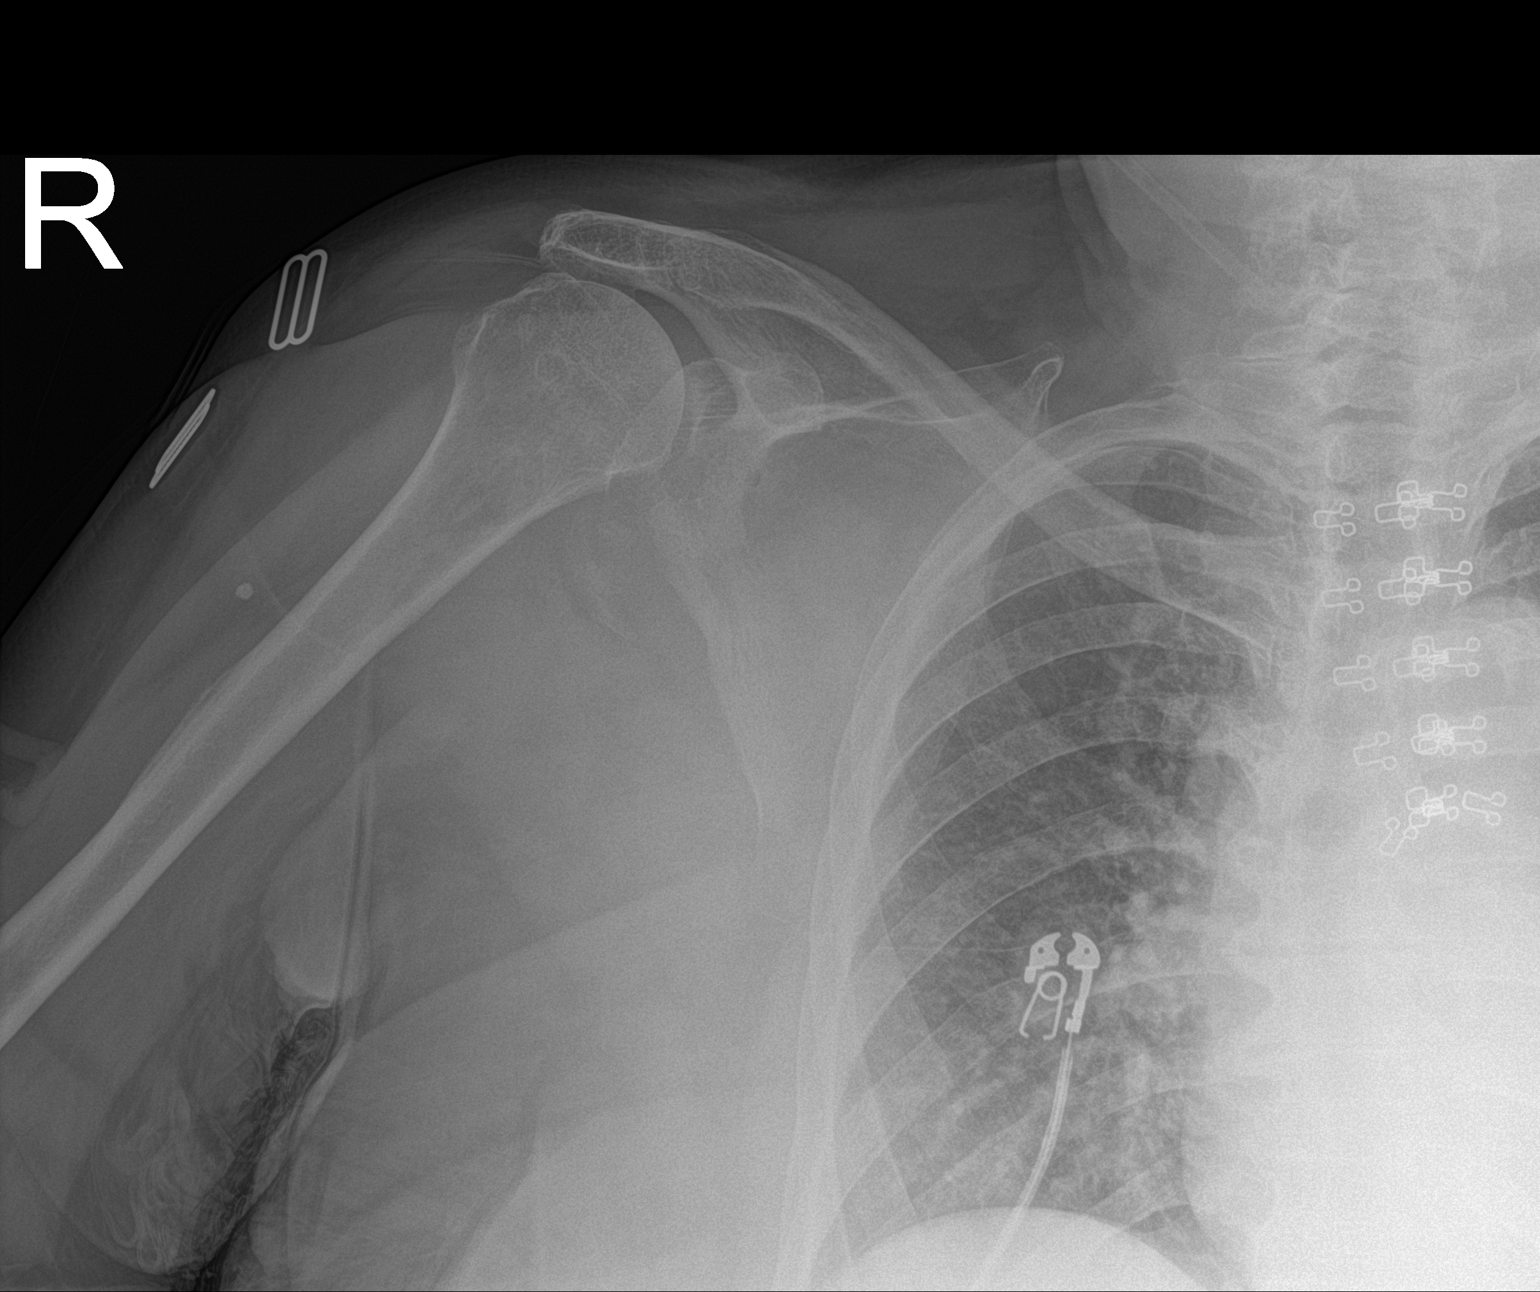

[1 of 1 positions shown; findings below may reference images not displayed]

FINDINGS: Right humeral head in normal anatomic alignment with the glenoid. AC
joint approximated. Probable Rathva and Hill-Sachs deformities
again noted. No other visible fracture. Small soft tissue
calcification involving the proximal soft tissues of the right upper
arm noted, of doubtful significance. Visualized soft tissues
otherwise unremarkable. Mild vascular congestion noted within the
visualized right lung.
IMPRESSION: 1. Right humeral head in normal anatomic alignment with the glenoid
status post reduction.
2. Probable Rathva and Hill-Sachs deformities.

## 2021-01-30 ENCOUNTER — Ambulatory Visit: Payer: Medicare HMO | Admitting: Physical Therapy

## 2021-01-30 ENCOUNTER — Encounter: Payer: Self-pay | Admitting: Physical Therapy

## 2021-01-30 ENCOUNTER — Other Ambulatory Visit: Payer: Self-pay

## 2021-01-30 DIAGNOSIS — R293 Abnormal posture: Secondary | ICD-10-CM

## 2021-01-30 DIAGNOSIS — G8929 Other chronic pain: Secondary | ICD-10-CM

## 2021-01-30 DIAGNOSIS — M25511 Pain in right shoulder: Secondary | ICD-10-CM | POA: Diagnosis not present

## 2021-01-30 DIAGNOSIS — M25611 Stiffness of right shoulder, not elsewhere classified: Secondary | ICD-10-CM

## 2021-01-30 DIAGNOSIS — M6281 Muscle weakness (generalized): Secondary | ICD-10-CM

## 2021-01-30 NOTE — Therapy (Signed)
[facility] [address], [place], [place], [address] Phone: [phone]   Fax:  [phone]  Physical Therapy Treatment  Patient Details  Name: Pamela Cox MRN: [id number] Date of Birth: March 02, 1941 Referring Provider (PT): Ervin Knack Ewing, PA   Encounter Date: 01/30/2021   PT End of Session - 01/30/21 0844    Visit Number 24    Date for PT Re-Evaluation 02/17/21    Authorization Type [employer] Medicare    Progress Note Due on Visit 30    PT Start Time 0840    PT Stop Time 0925    PT Time Calculation (min) 45 min    Activity Tolerance Patient tolerated treatment well    Behavior During Therapy [facility] for tasks assessed/performed           Past Medical History:  Diagnosis Date  . Arthritis   . CML (chronic myelocytic leukemia) ([place])   . CVA (cerebral vascular accident) ([place])   . Diabetes mellitus without complication ([place])   . GERD (gastroesophageal reflux disease)   . Hypertension   . Myelocytic leukemia, chronic ([place])   . Obesity   . Sleep apnea     Past Surgical History:  Procedure Laterality Date  . APPENDECTOMY    . COLONOSCOPY    . COLONOSCOPY WITH PROPOFOL N/A 07/16/2019   Procedure: COLONOSCOPY WITH PROPOFOL;  Surgeon: Lollie Sails, MD;  Location: [facility] ENDOSCOPY;  Service: Endoscopy;  Laterality: N/A;  . ESOPHAGOGASTRODUODENOSCOPY (EGD) WITH PROPOFOL N/A 07/16/2019   Procedure: ESOPHAGOGASTRODUODENOSCOPY (EGD) WITH PROPOFOL;  Surgeon: Lollie Sails, MD;  Location: [facility] ENDOSCOPY;  Service: Endoscopy;  Laterality: N/A;  . TUBAL LIGATION      There were no vitals filed for this visit.   Subjective Assessment - 01/30/21 0843    Subjective I continue to do the shoulder exercises that I can remember every day.  My back is feeling good today but some Lt leg pain.    Pertinent History gentle functional IR behind back, NO UBE AT ANY PHASE PER PROTOCOL    Limitations Lifting;House hold  activities;Other (comment)    Patient Stated Goals laundry, dishes, cooking    Currently in Pain? No/denies                             [facility] Adult PT Treatment/Exercise - 01/30/21 0001      Exercises   Exercises Shoulder;Elbow      Shoulder Exercises: Supine   Protraction Strengthening;Right;Weights;20 reps    Protraction Weight (lbs) 3    Protraction Limitations 2x10    Horizontal ABduction Strengthening;Both;Theraband;15 reps    Theraband Level (Shoulder Horizontal ABduction) Level 3 (Green)    Horizontal ABduction Limitations red too easy so moved to green    Flexion Strengthening;Right;Weights;20 reps    Shoulder Flexion Weight (lbs) 3    Flexion Limitations 2x10    Diagonals Strengthening;Right;15 reps;Theraband    Theraband Level (Shoulder Diagonals) Level 3 (Green)    Diagonals Limitations draw the sword      Shoulder Exercises: Sidelying   ABduction Strengthening;Right;20 reps;Weights    ABduction Weight (lbs) 3    ABduction Limitations 2x10      Shoulder Exercises: ROM/Strengthening   Nustep L2 x 8' PT present to review LTGs and discuss HEP progression    Proximal Shoulder Strengthening, Supine 3lb circles 2x10 each way      Shoulder Exercises: Power Development worker, community 20 reps  Extension Limitations Rt, 15lb, PT cued "reach for floor" to keep from hiking shoulder    Row 20 reps   bil   Row Limitations 20lb, PT cued pause in row, slow eccentric, avoid trunk leaning back/forth with row                    PT Short Term Goals - 01/06/21 1059      PT SHORT TERM GOAL #1   Title Pt will be ind with initial HEP following protocol for reverse TSA with pain not to exceed 3/10.    Status Achieved      PT SHORT TERM GOAL #2   Title Pt will report pain level </= 3/10 with incorporation of Rt UE for basic ADLs.    Status Achieved      PT SHORT TERM GOAL #3   Title Pt will achieve AA/ROM or A/ROM to at least 110 deg elevation and 30 deg ER.     Status Achieved      PT SHORT TERM GOAL #4   Title Pt will demo submax isometric of all heads of deltoid without pain.    Status Achieved             PT Long Term Goals - 01/30/21 0844      PT LONG TERM GOAL #1   Title Pt will achieve Rt shoulder A/ROM of 110+ elevation, 40 ER, and functional IR to at least L5 for improved reaching tasks and ADLs such as dressing and bathing.    Status Achieved      PT LONG TERM GOAL #2   Title Pt will improve Rt functional shoulder strength for incorporation of Rt UE use for light household tasks such as cleaning, cooking, and laundry with 25lb lifting restriction.    Baseline working on progressing strength      PT LONG TERM GOAL #3   Title FOTO score improved from 32% limitation to 50% to demo improved functional use of Rt shoulder    Baseline 61%    Status Achieved      PT LONG TERM GOAL #4   Title Pt will be able to return to household and short distance community ambulation with rollator with good stability of Rt UE and min/no pain in Rt shoulder    Status Achieved                 Plan - 01/30/21 0847    Clinical Impression Statement Pt reports shoulder pulleys have been delivered for use as part of HEP.  PT updated HEP to reflect updated strengthening exercises that have been well tolerated over past 1-2 weeks and reviewed with Pt.  She was able to perform rows at 20lb today using cable pulleys with need for cueing initially to isolate scapular retraction and avoid trunk motion into ext.  PT cued pausing at end range contractions today to avoid momentum, emphasize control,  and allow for Pt to have time to become more familiar with targeted muscles.  Pt demos consistent work rate through session using increased resistance bands and 3lb weight progression from last week.  She reports she can do most everything she wants to during the day using her Rt UE.  She will likely be ready for d/c to HEP end of next week as long as she is ind  with HEP.  She still needs intermittent cueing for form at this time.    Comorbidities falls, CVA, CML, DM, obesity  PT Next Visit Plan does Pt have questions from HEP, continue strength within available ROM, using green/blue bands and 3lb weight, add supine draw the sword to HEP    PT Home Exercise Plan Access Code: [email or web address]    Consulted and Agree with Plan of Care Patient;Family member/caregiver    Family Member Consulted Pt's daughter           Patient will benefit from skilled therapeutic intervention in order to improve the following deficits and impairments:     Visit Diagnosis: Chronic right shoulder pain  Muscle weakness (generalized)  Abnormal posture  Stiffness of right shoulder, not elsewhere classified     Problem List Patient Active Problem List   Diagnosis Date Noted  . Allergic rhinitis due to pollen 08/23/2018  . DDD (degenerative disc disease), lumbar 08/23/2018  . Lumbar neuritis 08/23/2018  . 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  . GERD (gastroesophageal reflux disease) 08/23/2018  . Morbid obesity ([place]) 08/23/2018  . Obstructive sleep apnea 08/23/2018  . Intervertebral disc disorder with radiculopathy of lumbar region 08/23/2018  . CML (chronic myelocytic leukemia) ([place]) 08/23/2018  . Hx of myocardial perfusion scan 08/23/2018  . Osteoarthritis 08/23/2018  . Degenerative arthritis of hip 08/23/2018  . Lumbar spondylosis 08/23/2018  . SOB (shortness of breath) 08/23/2018  . History of CVA (cerebrovascular accident) 08/23/2018  . Essential hypertension 08/23/2018  . Chronic right shoulder pain 08/23/2018  . Complete tear of right rotator cuff 08/23/2018  . Acute pain of left shoulder 08/23/2018  . On antineoplastic chemotherapy 08/23/2018  . Hyperlipidemia 08/23/2018  . Neurogenic claudication due to lumbar spinal stenosis 08/23/2018  . Vitamin D deficiency 08/23/2018    Baruch Merl, PT 01/30/21 9:26 AM   [facility] [address] [place], [place], [address] Phone: [phone]   Fax:  [phone]  Name: Pamela Cox MRN: [id number] Date of Birth: Aug 09, 1941

## 2021-02-04 ENCOUNTER — Other Ambulatory Visit: Payer: Self-pay

## 2021-02-04 ENCOUNTER — Encounter: Payer: Self-pay | Admitting: Physical Therapy

## 2021-02-04 ENCOUNTER — Ambulatory Visit: Payer: Medicare HMO | Admitting: Physical Therapy

## 2021-02-04 DIAGNOSIS — G8929 Other chronic pain: Secondary | ICD-10-CM

## 2021-02-04 DIAGNOSIS — R293 Abnormal posture: Secondary | ICD-10-CM

## 2021-02-04 DIAGNOSIS — M6281 Muscle weakness (generalized): Secondary | ICD-10-CM

## 2021-02-04 DIAGNOSIS — M25611 Stiffness of right shoulder, not elsewhere classified: Secondary | ICD-10-CM

## 2021-02-04 DIAGNOSIS — M25511 Pain in right shoulder: Secondary | ICD-10-CM | POA: Diagnosis not present

## 2021-02-04 NOTE — Therapy (Signed)
[facility] [address], [place], [place], [address] Phone: [phone]   Fax:  [phone]  Physical Therapy Treatment  Patient Details  Name: Pamela Cox MRN: [id number] Date of Birth: 03/19/1941 Referring Provider (PT): Ervin Knack Ewing, PA   Encounter Date: 02/04/2021   PT End of Session - 02/04/21 0851    Visit Number 25    Date for PT Re-Evaluation 02/17/21    Authorization Type [employer] Medicare    Progress Note Due on Visit 72    PT Start Time 0845    PT Stop Time 0928    PT Time Calculation (min) 43 min    Activity Tolerance Patient tolerated treatment well    Behavior During Therapy [facility] for tasks assessed/performed           Past Medical History:  Diagnosis Date  . Arthritis   . CML (chronic myelocytic leukemia) ([place])   . CVA (cerebral vascular accident) ([place])   . Diabetes mellitus without complication ([place])   . GERD (gastroesophageal reflux disease)   . Hypertension   . Myelocytic leukemia, chronic ([place])   . Obesity   . Sleep apnea     Past Surgical History:  Procedure Laterality Date  . APPENDECTOMY    . COLONOSCOPY    . COLONOSCOPY WITH PROPOFOL N/A 07/16/2019   Procedure: COLONOSCOPY WITH PROPOFOL;  Surgeon: Lollie Sails, MD;  Location: [facility] ENDOSCOPY;  Service: Endoscopy;  Laterality: N/A;  . ESOPHAGOGASTRODUODENOSCOPY (EGD) WITH PROPOFOL N/A 07/16/2019   Procedure: ESOPHAGOGASTRODUODENOSCOPY (EGD) WITH PROPOFOL;  Surgeon: Lollie Sails, MD;  Location: [facility] ENDOSCOPY;  Service: Endoscopy;  Laterality: N/A;  . TUBAL LIGATION      There were no vitals filed for this visit.   Subjective Assessment - 02/04/21 0850    Subjective Shoulder is feeling good, pain goes through it now and then but that's to be expected.  I tried all the updated exercises and don't have questions.    Pertinent History gentle functional IR behind back, NO UBE AT ANY PHASE PER PROTOCOL    Patient Stated  Goals laundry, dishes, cooking    Currently in Pain? No/denies                             [facility] Adult PT Treatment/Exercise - 02/04/21 0001      Lumbar Exercises: Aerobic   Nustep L2 x 8' end of session PT present to monitor increased resistance      Shoulder Exercises: Supine   Horizontal ABduction Strengthening;Theraband;20 reps    Theraband Level (Shoulder Horizontal ABduction) Level 3 (Green)    Flexion Strengthening;Weights;Right    Shoulder Flexion Weight (lbs) 3    Flexion Limitations 2x10    Diagonals Strengthening;Right;Theraband;10 reps    Theraband Level (Shoulder Diagonals) Level 3 (Green)    Diagonals Limitations draw the sword      Shoulder Exercises: Seated   Extension Strengthening;Both;20 reps;Theraband    Theraband Level (Shoulder Extension) Level 4 (Blue)    Extension Limitations 2x10    Row Strengthening;Theraband;20 reps;Both    Theraband Level (Shoulder Row) Level 4 (Blue)    Row Limitations 2x10    Flexion AROM;Strengthening;10 reps    Flexion Limitations hold 5 sec, slow eccentric lowering for control    Abduction AROM;10 reps    ABduction Limitations wide arc to 90, then touch back of head for functional ER, VC for wide arc and slow eccentric lowering  Shoulder Exercises: Pulleys   Flexion 2 minutes    Flexion Limitations mostly A/ROM on Rt VC by PT                    PT Short Term Goals - 01/06/21 1059      PT SHORT TERM GOAL #1   Title Pt will be ind with initial HEP following protocol for reverse TSA with pain not to exceed 3/10.    Status Achieved      PT SHORT TERM GOAL #2   Title Pt will report pain level </= 3/10 with incorporation of Rt UE for basic ADLs.    Status Achieved      PT SHORT TERM GOAL #3   Title Pt will achieve AA/ROM or A/ROM to at least 110 deg elevation and 30 deg ER.    Status Achieved      PT SHORT TERM GOAL #4   Title Pt will demo submax isometric of all heads of deltoid without  pain.    Status Achieved             PT Long Term Goals - 01/30/21 0844      PT LONG TERM GOAL #1   Title Pt will achieve Rt shoulder A/ROM of 110+ elevation, 40 ER, and functional IR to at least L5 for improved reaching tasks and ADLs such as dressing and bathing.    Status Achieved      PT LONG TERM GOAL #2   Title Pt will improve Rt functional shoulder strength for incorporation of Rt UE use for light household tasks such as cleaning, cooking, and laundry with 25lb lifting restriction.    Baseline working on progressing strength      PT LONG TERM GOAL #3   Title FOTO score improved from 32% limitation to 50% to demo improved functional use of Rt shoulder    Baseline 61%    Status Achieved      PT LONG TERM GOAL #4   Title Pt will be able to return to household and short distance community ambulation with rollator with good stability of Rt UE and min/no pain in Rt shoulder    Status Achieved                 Plan - 02/04/21 0915    Clinical Impression Statement Pt reports good transtion into updated HEP last week without questions.  She is having minimal occassional pain that doesn't last with daily tasks.  Her active motion against gravity is much improved for both range and quality for flexion, abduction and ER.  She needed less cueing during ther ex today showing increased muscle memory and mechanics.  Continue along POC with anticipated d/c end of next week.    Comorbidities falls, CVA, CML, DM, obesity    PT Next Visit Plan continue working on strength within available range, emphasis on diagonals    PT Home Exercise Plan Access Code: [email or web address]    Consulted and Agree with Plan of Care Patient           Patient will benefit from skilled therapeutic intervention in order to improve the following deficits and impairments:     Visit Diagnosis: Muscle weakness (generalized)  Chronic right shoulder pain  Abnormal posture  Stiffness of right shoulder, not  elsewhere classified     Problem List Patient Active Problem List   Diagnosis Date Noted  . Allergic rhinitis due to pollen 08/23/2018  . DDD (degenerative disc  disease), lumbar 08/23/2018  . Lumbar neuritis 08/23/2018  . 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  . GERD (gastroesophageal reflux disease) 08/23/2018  . Morbid obesity ([place]) 08/23/2018  . Obstructive sleep apnea 08/23/2018  . Intervertebral disc disorder with radiculopathy of lumbar region 08/23/2018  . CML (chronic myelocytic leukemia) ([place]) 08/23/2018  . Hx of myocardial perfusion scan 08/23/2018  . Osteoarthritis 08/23/2018  . Degenerative arthritis of hip 08/23/2018  . Lumbar spondylosis 08/23/2018  . SOB (shortness of breath) 08/23/2018  . History of CVA (cerebrovascular accident) 08/23/2018  . Essential hypertension 08/23/2018  . Chronic right shoulder pain 08/23/2018  . Complete tear of right rotator cuff 08/23/2018  . Acute pain of left shoulder 08/23/2018  . On antineoplastic chemotherapy 08/23/2018  . Hyperlipidemia 08/23/2018  . Neurogenic claudication due to lumbar spinal stenosis 08/23/2018  . Vitamin D deficiency 08/23/2018    Baruch Merl, PT 02/04/21 11:25 AM   [facility] [address], [place], [place], [address] Phone: [phone]   Fax:  [phone]  Name: Sulema Braid MRN: [id number] Date of Birth: 02-02-1941

## 2021-02-06 ENCOUNTER — Ambulatory Visit: Payer: Medicare HMO | Admitting: Physical Therapy

## 2021-02-06 ENCOUNTER — Encounter: Payer: Self-pay | Admitting: Physical Therapy

## 2021-02-06 ENCOUNTER — Other Ambulatory Visit: Payer: Self-pay

## 2021-02-06 DIAGNOSIS — M6281 Muscle weakness (generalized): Secondary | ICD-10-CM

## 2021-02-06 DIAGNOSIS — M25511 Pain in right shoulder: Secondary | ICD-10-CM

## 2021-02-06 DIAGNOSIS — G8929 Other chronic pain: Secondary | ICD-10-CM

## 2021-02-06 DIAGNOSIS — M25611 Stiffness of right shoulder, not elsewhere classified: Secondary | ICD-10-CM

## 2021-02-06 DIAGNOSIS — R293 Abnormal posture: Secondary | ICD-10-CM

## 2021-02-06 NOTE — Therapy (Signed)
[facility] [address] [place], [place], [address] Phone: [phone]   Fax:  [phone]  Physical Therapy Treatment  Patient Details  Name: Pamela Cox MRN: [id number] Date of Birth: February 17, 1941 Referring Provider (PT): Ervin Knack Ewing, PA   Encounter Date: 02/06/2021   PT End of Session - 02/06/21 0911    Visit Number 26    Date for PT Re-Evaluation 02/17/21    Authorization Type [employer]na Medicare    Progress Note Due on Visit 30    PT Start Time 0845    PT Stop Time 0930    PT Time Calculation (min) 45 min    Activity Tolerance Patient tolerated treatment well    Behavior During Therapy [facility] for tasks assessed/performed           Past Medical History:  Diagnosis Date  . Arthritis   . CML (chronic myelocytic leukemia) ([place])   . CVA (cerebral vascular accident) ([place])   . Diabetes mellitus without complication ([place])   . GERD (gastroesophageal reflux disease)   . Hypertension   . Myelocytic leukemia, chronic ([place])   . Obesity   . Sleep apnea     Past Surgical History:  Procedure Laterality Date  . APPENDECTOMY    . COLONOSCOPY    . COLONOSCOPY WITH PROPOFOL N/A 07/16/2019   Procedure: COLONOSCOPY WITH PROPOFOL;  Surgeon: Lollie Sails, MD;  Location[facility] ENDOSCOPY;  Service: Endoscopy;  Laterality: N/A;  . ESOPHAGOGASTRODUODENOSCOPY (EGD) WITH PROPOFOL N/A 07/16/2019   Procedure: ESOPHAGOGASTRODUODENOSCOPY (EGD) WITH PROPOFOL;  Surgeon: Lollie Sails, MD;  Location: [facility] ENDOSCOPY;  Service: Endoscopy;  Laterality: N/A;  . TUBAL LIGATION      There were no vitals filed for this visit.   Subjective Assessment - 02/06/21 0854    Subjective doing well, no HEP questions    Pertinent History gentle functional IR behind back, NO UBE AT ANY PHASE PER PROTOCOL    Limitations Lifting;House hold activities;Other (comment)    Patient Stated Goals laundry, dishes, cooking    Currently in Pain?  No/denies                             [facility] Adult PT Treatment/Exercise - 02/06/21 0001      Therapeutic Activites    Therapeutic Activities Other Therapeutic Activities    Other Therapeutic Activities 5 cone transfer x 2 rounds to shelf at shoulder height and overhead shelf each      Exercises   Exercises Shoulder;Elbow      Shoulder Exercises: Supine   Horizontal ABduction Strengthening;Theraband;10 reps    Theraband Level (Shoulder Horizontal ABduction) Level 3 (Green)    Flexion Strengthening;Weights;Right    Shoulder Flexion Weight (lbs) 3    Flexion Limitations 1x10    Diagonals Strengthening;Right;Theraband;10 reps    Theraband Level (Shoulder Diagonals) Level 3 (Green)    Diagonals Limitations draw the sword      Shoulder Exercises: Seated   Extension Strengthening;Both;20 reps;Theraband    Theraband Level (Shoulder Extension) Level 4 (Blue)    Extension Limitations 2x10    Row Strengthening;Theraband;20 reps;Both    Theraband Level (Shoulder Row) Level 4 (Blue)    Row Limitations 1x20    Flexion AROM;Strengthening;10 reps    Flexion Limitations hold 5 sec, slow eccentric lowering for control    Abduction AROM;10 reps    ABduction Limitations wide arc to 90, then touch back of head for functional  ER, VC for wide arc and slow eccentric lowering      Shoulder Exercises: Sidelying   ABduction Strengthening    ABduction Weight (lbs) 3    ABduction Limitations 1x10      Shoulder Exercises: Pulleys   Flexion 3 minutes    Flexion Limitations VC 75% A/ROM use of Rt UE    ABduction 2 minutes    ABduction Limitations VC 75% A/ROM use of Rt UE      Shoulder Exercises: ROM/Strengthening   Nustep L2x8' PT present to monitor, end of session                    PT Short Term Goals - 01/06/21 1059      PT SHORT TERM GOAL #1   Title Pt will be ind with initial HEP following protocol for reverse TSA with pain not to exceed 3/10.    Status  Achieved      PT SHORT TERM GOAL #2   Title Pt will report pain level </= 3/10 with incorporation of Rt UE for basic ADLs.    Status Achieved      PT SHORT TERM GOAL #3   Title Pt will achieve AA/ROM or A/ROM to at least 110 deg elevation and 30 deg ER.    Status Achieved      PT SHORT TERM GOAL #4   Title Pt will demo submax isometric of all heads of deltoid without pain.    Status Achieved             PT Long Term Goals - 01/30/21 0844      PT LONG TERM GOAL #1   Title Pt will achieve Rt shoulder A/ROM of 110+ elevation, 40 ER, and functional IR to at least L5 for improved reaching tasks and ADLs such as dressing and bathing.    Status Achieved      PT LONG TERM GOAL #2   Title Pt will improve Rt functional shoulder strength for incorporation of Rt UE use for light household tasks such as cleaning, cooking, and laundry with 25lb lifting restriction.    Baseline working on progressing strength      PT LONG TERM GOAL #3   Title FOTO score improved from 32% limitation to 50% to demo improved functional use of Rt shoulder    Baseline 61%    Status Achieved      PT LONG TERM GOAL #4   Title Pt will be able to return to household and short distance community ambulation with rollator with good stability of Rt UE and min/no pain in Rt shoulder    Status Achieved                 Plan - 02/06/21 0922    Clinical Impression Statement Pt was limited in standing cone transfer by back pain/LE fatigue today but demo'd excellent overhead mechanics with Rt shoulder during reaching task 5 cones x 2 rounds each to shoulder height and overhead shelf.  She continues to need intermittent VC to avoid trunk motion with seated ther ex to isolate shoulder motion and target muscle strengthening.  She has become more independent with her weights/bands in supine and SL needing very little cueing.  She is able to work straight through sets of varied muscle group exercises with little need for  rest breaks as compared to previous weeks of visits.  She will be ready for d/c next week.    Comorbidities falls, CVA, CML, DM,  obesity    PT Next Visit Plan continue working on strength within available range, emphasis on diagonals    PT Home Exercise Plan Access Code: [email or web address]           Patient will benefit from skilled therapeutic intervention in order to improve the following deficits and impairments:     Visit Diagnosis: Muscle weakness (generalized)  Chronic right shoulder pain  Abnormal posture  Stiffness of right shoulder, not elsewhere classified     Problem List Patient Active Problem List   Diagnosis Date Noted  . Allergic rhinitis due to pollen 08/23/2018  . DDD (degenerative disc disease), lumbar 08/23/2018  . Lumbar neuritis 08/23/2018  . 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  . GERD (gastroesophageal reflux disease) 08/23/2018  . Morbid obesity ([place]) 08/23/2018  . Obstructive sleep apnea 08/23/2018  . Intervertebral disc disorder with radiculopathy of lumbar region 08/23/2018  . CML (chronic myelocytic leukemia) ([place]) 08/23/2018  . Hx of myocardial perfusion scan 08/23/2018  . Osteoarthritis 08/23/2018  . Degenerative arthritis of hip 08/23/2018  . Lumbar spondylosis 08/23/2018  . SOB (shortness of breath) 08/23/2018  . History of CVA (cerebrovascular accident) 08/23/2018  . Essential hypertension 08/23/2018  . Chronic right shoulder pain 08/23/2018  . Complete tear of right rotator cuff 08/23/2018  . Acute pain of left shoulder 08/23/2018  . On antineoplastic chemotherapy 08/23/2018  . Hyperlipidemia 08/23/2018  . Neurogenic claudication due to lumbar spinal stenosis 08/23/2018  . Vitamin D deficiency 08/23/2018    Baruch Merl, PT 02/06/21 9:28 AM   [facility] [address], [place], [place], [address] Phone: [phone]   Fax:  [phone]  Name: Pamela Cox MRN:  [id number] Date of Birth: 10-12-1941

## 2021-02-11 ENCOUNTER — Encounter: Payer: Self-pay | Admitting: Physical Therapy

## 2021-02-11 ENCOUNTER — Ambulatory Visit: Payer: Medicare HMO | Admitting: Physical Therapy

## 2021-02-11 ENCOUNTER — Other Ambulatory Visit: Payer: Self-pay

## 2021-02-11 DIAGNOSIS — M25511 Pain in right shoulder: Secondary | ICD-10-CM

## 2021-02-11 DIAGNOSIS — G8929 Other chronic pain: Secondary | ICD-10-CM

## 2021-02-11 DIAGNOSIS — M6281 Muscle weakness (generalized): Secondary | ICD-10-CM

## 2021-02-11 DIAGNOSIS — R293 Abnormal posture: Secondary | ICD-10-CM

## 2021-02-11 DIAGNOSIS — M25611 Stiffness of right shoulder, not elsewhere classified: Secondary | ICD-10-CM

## 2021-02-11 NOTE — Therapy (Signed)
[facility] [address] [place], [place], [address] Phone: [phone]   Fax:  [phone]  Physical Therapy Treatment  Patient Details  Name: Pamela Cox MRN: [id number] Date of Birth: [birth year] Referring Provider (PT): Ervin Knack Ewing, PA   Encounter Date: 02/11/2021   PT End of Session - 02/11/21 0836    Visit Number 27    Date for PT Re-Evaluation 02/17/21    Authorization Type [employer]na Medicare    Progress Note Due on Visit 30    PT Start Time [phone]    PT Stop Time 0923    PT Time Calculation (min) 45 min    Activity Tolerance Patient tolerated treatment well    Behavior During Therapy [facility] for tasks assessed/performed           Past Medical History:  Diagnosis Date  . Arthritis   . CML (chronic myelocytic leukemia) ([place])   . CVA (cerebral vascular accident) ([place])   . Diabetes mellitus without complication ([place])   . GERD (gastroesophageal reflux disease)   . Hypertension   . Myelocytic leukemia, chronic ([place])   . Obesity   . Sleep apnea     Past Surgical History:  Procedure Laterality Date  . APPENDECTOMY    . COLONOSCOPY    . COLONOSCOPY WITH PROPOFOL N/A 07/16/2019   Procedure: COLONOSCOPY WITH PROPOFOL;  Surgeon: Lollie Sails, MD;  Location: [facility] ENDOSCOPY;  Service: Endoscopy;  Laterality: N/A;  . ESOPHAGOGASTRODUODENOSCOPY (EGD) WITH PROPOFOL N/A 07/16/2019   Procedure: ESOPHAGOGASTRODUODENOSCOPY (EGD) WITH PROPOFOL;  Surgeon: Lollie Sails, MD;  Location: [facility] ENDOSCOPY;  Service: Endoscopy;  Laterality: N/A;  . TUBAL LIGATION      There were no vitals filed for this visit.   Subjective Assessment - 02/11/21 0836    Subjective HEP going well.  I keep it moving throughout the day.  No pain.  So thankful for my progress.    Pertinent History gentle functional IR behind back, NO UBE AT ANY PHASE PER PROTOCOL    Limitations Lifting;House hold activities;Other (comment)     Patient Stated Goals laundry, dishes, cooking    Currently in Pain? No/denies              [facility] PT Assessment - 02/11/21 0001      Observation/Other Assessments   Focus on Therapeutic Outcomes (FOTO)  63%, another improvement by 2% (initial score 32%)      ROM / Strength   AROM / PROM / Strength Strength      AROM   Right Shoulder Flexion 155 Degrees      PROM   Right Shoulder Flexion 158 Degrees    Right Shoulder Internal Rotation 50 Degrees    Right Shoulder External Rotation 45 Degrees      Strength   Strength Assessment Site Shoulder    Right/Left Shoulder Right    Right Shoulder Flexion 4+/5    Right Shoulder Extension 4+/5    Right Shoulder ABduction 4+/5    Right Shoulder Internal Rotation 4+/5    Right Shoulder External Rotation 4/5    Right Shoulder Horizontal ABduction 4+/5                         [facility] Adult PT Treatment/Exercise - 02/11/21 0001      Exercises   Exercises Shoulder;Elbow      Shoulder Exercises: Supine   Horizontal ABduction Strengthening;Both;20 reps;Theraband    Theraband Level (Shoulder  Horizontal ABduction) Level 3 (Green);Level 4 (Blue)    Horizontal ABduction Limitations progressed to blue at rep 5 due to too easy with green    Flexion Strengthening    Shoulder Flexion Weight (lbs) 3    Flexion Limitations small arcs of motion 90deg to 120deg dowel +3lb ankle weight 20 reps    Diagonals Strengthening;Right;20 reps    Theraband Level (Shoulder Diagonals) Level 3 (Green)    Diagonals Limitations draw the sword      Shoulder Exercises: Sidelying   External Rotation Strengthening;Right;Weights;20 reps    External Rotation Limitations 1    ABduction Strengthening;Right;20 reps;Weights    ABduction Weight (lbs) 3    ABduction Limitations 2x10      Shoulder Exercises: ROM/Strengthening   Nustep L3 x 8' PT present to discuss progress and plan for HEP after D/C    Other ROM/Strengthening Exercises supine series: 3lb  elbow flexion, chest press to 5x5 circles, then 5 serratus punches x 3 cycles, Rt UE                    PT Short Term Goals - 01/06/21 1059      PT SHORT TERM GOAL #1   Title Pt will be ind with initial HEP following protocol for reverse TSA with pain not to exceed 3/10.    Status Achieved      PT SHORT TERM GOAL #2   Title Pt will report pain level </= 3/10 with incorporation of Rt UE for basic ADLs.    Status Achieved      PT SHORT TERM GOAL #3   Title Pt will achieve AA/ROM or A/ROM to at least 110 deg elevation and 30 deg ER.    Status Achieved      PT SHORT TERM GOAL #4   Title Pt will demo submax isometric of all heads of deltoid without pain.    Status Achieved             PT Long Term Goals - 01/30/21 0844      PT LONG TERM GOAL #1   Title Pt will achieve Rt shoulder A/ROM of 110+ elevation, 40 ER, and functional IR to at least L5 for improved reaching tasks and ADLs such as dressing and bathing.    Status Achieved      PT LONG TERM GOAL #2   Title Pt will improve Rt functional shoulder strength for incorporation of Rt UE use for light household tasks such as cleaning, cooking, and laundry with 25lb lifting restriction.    Baseline working on progressing strength      PT LONG TERM GOAL #3   Title FOTO score improved from 32% limitation to 50% to demo improved functional use of Rt shoulder    Baseline 61%    Status Achieved      PT LONG TERM GOAL #4   Title Pt will be able to return to household and short distance community ambulation with rollator with good stability of Rt UE and min/no pain in Rt shoulder    Status Achieved                 Plan - 02/11/21 0843    Clinical Impression Statement Pt continues to have painfree functional ROM of Rt shoulder within limits of reverse total shoulder hardware.  Biggest limitation is funtional IR (to gluteal fold or mid-buttock when stretched out) but she has support of family members at home for any  behind the back  activity she cannot perform (i.e. dressing).  FOTO score improved another 2 points from 61% to 63% demo'ing another improvement in function.  Rt shoulder strength has improved to 4+/5 for all muscles but Rt shoulder ER is 4/5.  Pt progressed to blue band for supine horiz abduction today and consistently uses 3lb weight for most ther ex, performing more reps than in the past.  She uses Rt arm for function without limitations during the day for tasks like laundry and light ADLs.  She has been compliant with updated HEP.  Her endurance and gait have improved significantly.  She is transfering ind and no longer needs supervision for safety with gait with RW.  She participated actively on NuStep level 3 x 8 min today without SOB.  She needs less cueing for form with all ther ex and will be ready for d/c next visit.    Comorbidities falls, CVA, CML, DM, obesity    PT Frequency 2x / week    PT Duration 6 weeks    PT Treatment/Interventions ADLs/Self Care Home Management;Cryotherapy;Electrical Stimulation;Moist Heat;DME Instruction;Gait training;Therapeutic exercise;Therapeutic activities;Functional mobility training;Neuromuscular re-education;Patient/family education;Manual techniques;Passive range of motion;Wheelchair mobility training    PT Next Visit Plan review bands, d/c to HEP next time    PT Home Exercise Plan Access Code: [email or web address]    Consulted and Agree with Plan of Care Patient           Patient will benefit from skilled therapeutic intervention in order to improve the following deficits and impairments:     Visit Diagnosis: Muscle weakness (generalized)  Chronic right shoulder pain  Abnormal posture  Stiffness of right shoulder, not elsewhere classified     Problem List Patient Active Problem List   Diagnosis Date Noted  . Allergic rhinitis due to pollen 08/23/2018  . DDD (degenerative disc disease), lumbar 08/23/2018  . Lumbar neuritis 08/23/2018  . Dysthymic  disorder 08/23/2018  . GERD (gastroesophageal reflux disease) 08/23/2018  . Morbid obesity ([place]) 08/23/2018  . Obstructive sleep apnea 08/23/2018  . Intervertebral disc disorder with radiculopathy of lumbar region 08/23/2018  . CML (chronic myelocytic leukemia) ([place]) 08/23/2018  . Hx of myocardial perfusion scan 08/23/2018  . Osteoarthritis 08/23/2018  . Degenerative arthritis of hip 08/23/2018  . Lumbar spondylosis 08/23/2018  . SOB (shortness of breath) 08/23/2018  . History of CVA (cerebrovascular accident) 08/23/2018  . Essential hypertension 08/23/2018  . Chronic right shoulder pain 08/23/2018  . Complete tear of right rotator cuff 08/23/2018  . Acute pain of left shoulder 08/23/2018  . On antineoplastic chemotherapy 08/23/2018  . Hyperlipidemia 08/23/2018  . Neurogenic claudication due to lumbar spinal stenosis 08/23/2018  . Vitamin D deficiency 08/23/2018    Baruch Merl, PT 02/11/21 9:23 AM   [facility] [address], [place], [place], [address] Phone: [phone]   Fax:  [phone]  Name: Pamela Cox MRN: [id number] Date of Birth: [birth year]

## 2021-02-13 ENCOUNTER — Encounter: Payer: Self-pay | Admitting: Physical Therapy

## 2021-02-13 ENCOUNTER — Ambulatory Visit: Payer: Medicare HMO | Admitting: Physical Therapy

## 2021-02-13 ENCOUNTER — Other Ambulatory Visit: Payer: Self-pay

## 2021-02-13 DIAGNOSIS — R293 Abnormal posture: Secondary | ICD-10-CM

## 2021-02-13 DIAGNOSIS — G8929 Other chronic pain: Secondary | ICD-10-CM

## 2021-02-13 DIAGNOSIS — M6281 Muscle weakness (generalized): Secondary | ICD-10-CM

## 2021-02-13 DIAGNOSIS — M25511 Pain in right shoulder: Secondary | ICD-10-CM | POA: Diagnosis not present

## 2021-02-13 DIAGNOSIS — M25611 Stiffness of right shoulder, not elsewhere classified: Secondary | ICD-10-CM

## 2021-02-13 NOTE — Therapy (Signed)
[facility] [address], [address] [place], [place], [address] Phone: [phone]   Fax:  [phone]  Physical Therapy Treatment  Patient Details  Name: Pamela Cox MRN: [id number] Date of Birth: 03/12/1941 Referring Provider (PT): Neil Robert Ewing, PA   Encounter Date: 02/13/2021   PT End of Session - 02/13/21 0857    Visit Number 28    Date for PT Re-Evaluation 02/17/21    Authorization Type Aetna Medicare    Progress Note Due on Visit 30    PT Start Time 0847    PT Stop Time 0928    PT Time Calculation (min) 41 min    Activity Tolerance Patient tolerated treatment well    Behavior During Therapy WFL for tasks assessed/performed           Past Medical History:  Diagnosis Date  . Arthritis   . CML (chronic myelocytic leukemia) (HCC)   . CVA (cerebral vascular accident) (HCC)   . Diabetes mellitus without complication (HCC)   . GERD (gastroesophageal reflux disease)   . Hypertension   . Myelocytic leukemia, chronic (HCC)   . Obesity   . Sleep apnea     Past Surgical History:  Procedure Laterality Date  . APPENDECTOMY    . COLONOSCOPY    . COLONOSCOPY WITH PROPOFOL N/A 07/16/2019   Procedure: COLONOSCOPY WITH PROPOFOL;  Surgeon: Skulskie, Martin U, MD;  Location: [facility] ENDOSCOPY;  Service: Endoscopy;  Laterality: N/A;  . ESOPHAGOGASTRODUODENOSCOPY (EGD) WITH PROPOFOL N/A 07/16/2019   Procedure: ESOPHAGOGASTRODUODENOSCOPY (EGD) WITH PROPOFOL;  Surgeon: Skulskie, Martin U, MD;  Location: [facility] ENDOSCOPY;  Service: Endoscopy;  Laterality: N/A;  . TUBAL LIGATION      There were no vitals filed for this visit.   Subjective Assessment - 02/13/21 0857    Subjective Feeling good and ready to be on my own.  No questions about HEP.    Pertinent History gentle functional IR behind back, NO UBE AT ANY PHASE PER PROTOCOL    Limitations Lifting;House hold activities;Other (comment)              [facility] PT Assessment  - 02/13/21 0001      Assessment   Medical Diagnosis s/p Right reverse TSA    Referring Provider (PT) Neil Robert Ewing, PA    Onset Date/Surgical Date 09/17/20    Next MD Visit 10/27/20    Prior Therapy not for shoulder      Observation/Other Assessments   Focus on Therapeutic Outcomes (FOTO)  63%, another improvement by 2% (initial score 32%)      ROM / Strength   AROM / PROM / Strength AROM;Strength      AROM   Right Shoulder Flexion 155 Degrees    Right Shoulder ABduction 120 Degrees      PROM   Right Shoulder Flexion 158 Degrees    Right Shoulder Internal Rotation 70 Degrees   measured arm in scaption in supine   Right Shoulder External Rotation 45 Degrees   measured arm in scaption in supine     Strength   Right Shoulder Flexion 4+/5    Right Shoulder Extension 4+/5    Right Shoulder ABduction 4+/5    Right Shoulder Internal Rotation 4+/5    Right Shoulder External Rotation 4/5    Right Shoulder Horizontal ABduction 4+/5                         [facility] Adult PT Treatment/Exercise -   02/13/21 0001      Self-Care   Self-Care Other Self-Care Comments    Other Self-Care Comments  verbal review of HEP and importance of compliance to prevent from losing range      Exercises   Exercises Shoulder      Shoulder Exercises: Seated   Extension Strengthening;Both;20 reps;Theraband    Theraband Level (Shoulder Extension) Level 4 (Blue)    Corporate treasurer;Theraband;20 reps;Both    Theraband Level (Shoulder Row) Level 4 (Blue)    External Rotation AAROM;Right;10 reps    External Rotation Limitations 5 sec holds with dowel for ER end range stretch      Shoulder Exercises: Pulleys   Flexion 2 minutes    Flexion Limitations slow with stretch x 5" each rep    Scaption 2 minutes    Scaption Limitations ROM only    ABduction 2 minutes    ABduction Limitations ROM only      Shoulder Exercises: ROM/Strengthening   Nustep L3 x 10' PT present to review goals and  discuss HEP plan for d/c today      Manual Therapy   Manual Therapy Soft tissue mobilization    Soft tissue mobilization Rt upper trap and anterior shoulder/pecs Addaday assisted massage                    PT Short Term Goals - 01/06/21 1059      PT SHORT TERM GOAL #1   Title Pt will be ind with initial HEP following protocol for reverse TSA with pain not to exceed 3/10.    Status Achieved      PT SHORT TERM GOAL #2   Title Pt will report pain level </= 3/10 with incorporation of Rt UE for basic ADLs.    Status Achieved      PT SHORT TERM GOAL #3   Title Pt will achieve AA/ROM or A/ROM to at least 110 deg elevation and 30 deg ER.    Status Achieved      PT SHORT TERM GOAL #4   Title Pt will demo submax isometric of all heads of deltoid without pain.    Status Achieved             PT Long Term Goals - 02/13/21 0858      PT LONG TERM GOAL #1   Title Pt will achieve Rt shoulder A/ROM of 110+ elevation, 40 ER, and functional IR to at least L5 for improved reaching tasks and ADLs such as dressing and bathing.    Status Achieved      PT LONG TERM GOAL #2   Title Pt will improve Rt functional shoulder strength for incorporation of Rt UE use for light household tasks such as cleaning, cooking, and laundry with 25lb lifting restriction.    Status Achieved      PT LONG TERM GOAL #3   Title FOTO score improved from 32% limitation to 50% to demo improved functional use of Rt shoulder    Baseline 61%    Status Achieved      PT LONG TERM GOAL #4   Title Pt will be able to return to household and short distance community ambulation with rollator with good stability of Rt UE and min/no pain in Rt shoulder    Status Achieved                 Plan - 02/13/21 0908    Clinical Impression Statement Pt has met all LTGs and uses  Rt UE for light household tasks and ADLs with independence and without pain.  She is compliant with HEP and has full available expected ROM  within limits of reverse total shoulder replacement.  She is ind with transfers and safe with ambulation using RW.  She is ready for d/c to HEP.    Comorbidities falls, CVA, CML, DM, obesity    PT Next Visit Plan d/c to HEP    PT Home Exercise Plan Access Code: [email or web address]    Consulted and Agree with Plan of Care Patient           Patient will benefit from skilled therapeutic intervention in order to improve the following deficits and impairments:     Visit Diagnosis: Muscle weakness (generalized)  Chronic right shoulder pain  Abnormal posture  Stiffness of right shoulder, not elsewhere classified     Problem List Patient Active Problem List   Diagnosis Date Noted  . Allergic rhinitis due to pollen 08/23/2018  . DDD (degenerative disc disease), lumbar 08/23/2018  . Lumbar neuritis 08/23/2018  . 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  . GERD (gastroesophageal reflux disease) 08/23/2018  . Morbid obesity ([place]) 08/23/2018  . Obstructive sleep apnea 08/23/2018  . Intervertebral disc disorder with radiculopathy of lumbar region 08/23/2018  . CML (chronic myelocytic leukemia) ([place]) 08/23/2018  . Hx of myocardial perfusion scan 08/23/2018  . Osteoarthritis 08/23/2018  . Degenerative arthritis of hip 08/23/2018  . Lumbar spondylosis 08/23/2018  . SOB (shortness of breath) 08/23/2018  . History of CVA (cerebrovascular accident) 08/23/2018  . Essential hypertension 08/23/2018  . Chronic right shoulder pain 08/23/2018  . Complete tear of right rotator cuff 08/23/2018  . Acute pain of left shoulder 08/23/2018  . On antineoplastic chemotherapy 08/23/2018  . Hyperlipidemia 08/23/2018  . Neurogenic claudication due to lumbar spinal stenosis 08/23/2018  . Vitamin D deficiency 08/23/2018    PHYSICAL THERAPY DISCHARGE SUMMARY  Visits from Start of Care: 28  Current functional level related to goals / functional outcomes: See above   Remaining deficits: See above   Education /  Equipment: HEP Plan: Patient agrees to discharge.  Patient goals were met. Patient is being discharged due to meeting the stated rehab goals.  ?????        Baruch Merl, PT 02/13/21 9:28 AM   [facility] [address], [place], [place], [address] Phone: [phone]   Fax:  [phone]  Name: Roselina Burgueno MRN: [id number] Date of Birth: Jan 04, 1941

## 2021-02-18 ENCOUNTER — Encounter: Payer: Medicare HMO | Admitting: Physical Therapy

## 2021-02-20 ENCOUNTER — Encounter: Payer: Medicare HMO | Admitting: Physical Therapy

## 2021-02-25 ENCOUNTER — Encounter: Payer: Medicare HMO | Admitting: Physical Therapy

## 2021-02-27 ENCOUNTER — Encounter: Payer: Medicare HMO | Admitting: Physical Therapy

## 2021-03-04 ENCOUNTER — Encounter: Payer: Medicare HMO | Admitting: Physical Therapy

## 2021-03-06 ENCOUNTER — Encounter: Payer: Medicare HMO | Admitting: Physical Therapy

## 2021-03-09 ENCOUNTER — Encounter: Payer: Medicare HMO | Admitting: Physical Therapy

## 2021-06-05 ENCOUNTER — Other Ambulatory Visit: Payer: Self-pay | Admitting: Otolaryngology

## 2021-06-05 DIAGNOSIS — E041 Nontoxic single thyroid nodule: Secondary | ICD-10-CM

## 2021-06-24 ENCOUNTER — Ambulatory Visit: Payer: Medicare HMO | Attending: Otolaryngology

## 2021-07-14 ENCOUNTER — Other Ambulatory Visit: Payer: Self-pay

## 2021-07-14 ENCOUNTER — Ambulatory Visit
Admission: RE | Admit: 2021-07-14 | Discharge: 2021-07-14 | Disposition: A | Discharge status: Home / Self Care | Payer: Medicare HMO | Source: Ambulatory Visit | Attending: Otolaryngology | Admitting: Otolaryngology

## 2021-07-14 DIAGNOSIS — E041 Nontoxic single thyroid nodule: Secondary | ICD-10-CM | POA: Insufficient documentation

## 2021-07-15 ENCOUNTER — Other Ambulatory Visit: Payer: Self-pay | Admitting: Otolaryngology

## 2021-07-15 DIAGNOSIS — E041 Nontoxic single thyroid nodule: Secondary | ICD-10-CM

## 2021-08-31 ENCOUNTER — Encounter: Payer: Self-pay | Admitting: Rehabilitative and Restorative Service Providers"

## 2021-08-31 ENCOUNTER — Other Ambulatory Visit: Payer: Self-pay

## 2021-08-31 ENCOUNTER — Ambulatory Visit: Payer: Medicare HMO | Attending: Orthopedic Surgery | Admitting: Rehabilitative and Restorative Service Providers"

## 2021-08-31 DIAGNOSIS — M25561 Pain in right knee: Secondary | ICD-10-CM | POA: Diagnosis not present

## 2021-08-31 DIAGNOSIS — G8929 Other chronic pain: Secondary | ICD-10-CM | POA: Insufficient documentation

## 2021-08-31 DIAGNOSIS — R262 Difficulty in walking, not elsewhere classified: Secondary | ICD-10-CM | POA: Diagnosis not present

## 2021-08-31 DIAGNOSIS — M1711 Unilateral primary osteoarthritis, right knee: Secondary | ICD-10-CM | POA: Insufficient documentation

## 2021-08-31 DIAGNOSIS — M1712 Unilateral primary osteoarthritis, left knee: Secondary | ICD-10-CM | POA: Diagnosis not present

## 2021-08-31 DIAGNOSIS — M25562 Pain in left knee: Secondary | ICD-10-CM | POA: Diagnosis not present

## 2021-08-31 DIAGNOSIS — M6281 Muscle weakness (generalized): Secondary | ICD-10-CM | POA: Insufficient documentation

## 2021-08-31 NOTE — Patient Instructions (Signed)
Access Code: [email or web address] URL: [email or web address] Date: 08/31/2021 Prepared by: Shelby Dubin Tenille Morrill  Exercises Seated Long Arc Quad - 2 x daily - 7 x weekly - 2 sets - 10 reps Seated March - 2 x daily - 7 x weekly - 2 sets - 10 reps Seated Hip Abduction - 2 x daily - 7 x weekly - 2 sets - 10 reps

## 2021-08-31 NOTE — Therapy (Signed)
[facility] @ [place], [place], [address] Phone: [phone]   Fax:  [phone]  Physical Therapy Evaluation  Patient Details  Name: Pamela Cox MRN: [id number] Date of Birth: [birth year] Referring Provider (PT): Dr Kizzie Bane   Encounter Date: 08/31/2021   PT End of Session - 08/31/21 1327     Visit Number 1    Date for PT Re-Evaluation 10/23/21    Authorization Type Aetna Medicare    PT Start Time 1100    PT Stop Time 1140    PT Time Calculation (min) 40 min    Activity Tolerance Patient tolerated treatment well    Behavior During Therapy [facility] for tasks assessed/performed             Past Medical History:  Diagnosis Date   Arthritis    CML (chronic myelocytic leukemia) ([place])    CVA (cerebral vascular accident) ([place])    Diabetes mellitus without complication ([place])    GERD (gastroesophageal reflux disease)    Hypertension    Myelocytic leukemia, chronic ([place])    Obesity    Sleep apnea     Past Surgical History:  Procedure Laterality Date   APPENDECTOMY     COLONOSCOPY     COLONOSCOPY WITH PROPOFOL N/A 07/16/2019   Procedure: COLONOSCOPY WITH PROPOFOL;  Surgeon: Lollie Sails, MD;  Location: [facility] ENDOSCOPY;  Service: Endoscopy;  Laterality: N/A;   ESOPHAGOGASTRODUODENOSCOPY (EGD) WITH PROPOFOL N/A 07/16/2019   Procedure: ESOPHAGOGASTRODUODENOSCOPY (EGD) WITH PROPOFOL;  Surgeon: Lollie Sails, MD;  Location: [facility] ENDOSCOPY;  Service: Endoscopy;  Laterality: N/A;   TUBAL LIGATION      There were no vitals filed for this visit.    Subjective Assessment - 08/31/21 1149     Subjective Pt reports a longstanding history of B knee pain.  She states that her last steroid injection was approx 2 months ago. She reports that the injections do help for a time.  Pt is hoping to avoid surgery as she recently had R shoulder replacement.    Pertinent History R Total Sh Arthroplasty  09/17/20, CML, HTN, GERD, CVA, spinal stenosis, neurogenic claudication    Limitations Walking;Standing    How long can you stand comfortably? 5 min    How long can you walk comfortably? 10 min with RW    Diagnostic tests radiographs reveal OA in B knees    Patient Stated Goals To be stronger and be able to walk better.    Currently in Pain? Yes    Pain Score 5     Pain Location Knee    Pain Orientation Left;Right   L knee is worse than R   Pain Descriptors / Indicators Dull;Tiring    Pain Type Chronic pain    Pain Onset More than a month ago    Pain Frequency Intermittent    Multiple Pain Sites Yes    Pain Score 7    Pain Location Back    Pain Orientation Lower    Pain Descriptors / Indicators Tiring;Sore    Pain Type Chronic pain    Pain Radiating Towards radiating into R hip    Pain Onset More than a month ago                [facility] PT Assessment - 08/31/21 0001       Assessment   Medical Diagnosis OA B knees    Referring Provider (PT) Dr Kizzie Bane  Hand Dominance Right    Next MD Visit as needed    Prior Therapy yes for various reasons      Precautions   Precautions Fall      Restrictions   Weight Bearing Restrictions No      Balance Screen   Has the patient fallen in the past 6 months No    Has the patient had a decrease in activity level because of a fear of falling?  Yes    Is the patient reluctant to leave their home because of a fear of falling?  Yes      [place];Other relatives    Type of [place] Access Level entry    [place] Two level;Able to live on main level with bedroom/bathroom    [facility] - 2 wheels;Wheelchair - manual      Prior Function   Level of Independence Independent with household mobility with device    Vocation Retired    Leisure housework, playing with grandson      Cognition   Overall Cognitive Status Within  Functional Limits for tasks assessed      Observation/Other Assessments   Focus on Therapeutic Outcomes (FOTO)  46%   Predicted 50%.     ROM / Strength   AROM / PROM / Strength Strength      Strength   Overall Strength Comments B hip and knee strength of 4/5 grossly throughout      Palpation   Patella mobility limited, L more limited than R    Palpation comment crepitation noted B knees with ROM      Transfers   Five time sit to stand comments  24.7 sec with limited UE use required.      Standardized Balance Assessment   Standardized Balance Assessment Timed Up and Go Test      Timed Up and Go Test   TUG Normal TUG    Normal TUG (seconds) 31.6   with RW                       Objective measurements completed on examination: See above findings.       [facility] Adult PT Treatment/Exercise - 08/31/21 0001       Exercises   Exercises Knee/Hip                     PT Education - 08/31/21 1216     Education Details Pt provided with HEP    Person(s) Educated Patient    Methods Explanation;Demonstration;Handout    Comprehension Verbalized understanding              PT Short Term Goals - 08/31/21 1337       PT SHORT TERM GOAL #1   Title Pt will be independent with initial HEP.    Time 2    Period Weeks    Status New               PT Long Term Goals - 08/31/21 1337       PT LONG TERM GOAL #1   Title Pt will be independent with advanced HEP.    Time 8    Period Weeks    Status New      PT LONG TERM GOAL #2   Title Pt will increase BLE strength to at least 4+/5 to allow her  to perform functional activities.    Time 8    Period Weeks    Status New      PT LONG TERM GOAL #3   Title FOTO score will increase to 50% to demo improved functional use of B knees.    Time 8    Period Weeks    Status New      PT LONG TERM GOAL #4   Title Pt will be able to return to household and short distance community ambulation with rollator  with good stability of Rt UE and min/no pain in Rt shoulder    Time 8    Period Weeks                    Plan - 08/31/21 1329     Clinical Impression Statement Pt is a 80 y.o. female referred from Dr Kizzie Bane secondary to B knee OA. Her PLOF was able to ambulate around the neighborhood with her rollator a couple times per week and perform light household chores and cleaning.  She currently presents with increased B knee pain, muscle weakness, difficulty walking, and increased time to perform functional activities. Pt states that she had a fall approx a year ago and still has some pain from that.  Pt is hoping to hold off knee arthroplasty as long as possible, as she just had her R shoulder arthroplasty 08/2020. Pt would benefit from skilled PT to address her functional impairments to allow her to be safer and more independent in her home and with community activities.    Personal Factors and Comorbidities Age    Examination-Activity Limitations Stairs;Continence    Examination-Participation Restrictions Community Activity    Stability/Clinical Decision Making Stable/Uncomplicated    Clinical Decision Making Low    Rehab Potential Good    PT Frequency 2x / week    PT Duration 8 weeks    PT Treatment/Interventions ADLs/Self Care Home Management;Cryotherapy;Electrical Stimulation;Iontophoresis 4mg /ml Dexamethasone;Moist Heat;Ultrasound;Gait training;Stair training;Therapeutic activities;Functional mobility training;Therapeutic exercise;Balance training;Patient/family education;Manual techniques;Passive range of motion;Dry needling;Taping;Vasopneumatic Device;Joint Manipulations    PT Next Visit Plan assess and progress HEP. strengthening    PT Home Exercise Plan Access Code: [email or web address]    Consulted and Agree with Plan of Care Patient             Patient will benefit from skilled therapeutic intervention in order to improve the following deficits and impairments:  Difficulty  walking, Increased muscle spasms, Obesity, Increased edema, Decreased activity tolerance, Decreased strength, Pain  Visit Diagnosis: Chronic pain of left knee - Plan: PT plan of care cert/re-cert  Chronic pain of right knee - Plan: PT plan of care cert/re-cert  Muscle weakness (generalized) - Plan: PT plan of care cert/re-cert  Difficulty in walking, not elsewhere classified - Plan: PT plan of care cert/re-cert     Problem List Patient Active Problem List   Diagnosis Date Noted   Allergic rhinitis due to pollen 08/23/2018   DDD (degenerative disc disease), lumbar 08/23/2018   Lumbar neuritis 08/23/2018   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   GERD (gastroesophageal reflux disease) 08/23/2018   Morbid obesity ([place]) 08/23/2018   Obstructive sleep apnea 08/23/2018   Intervertebral disc disorder with radiculopathy of lumbar region 08/23/2018   CML (chronic myelocytic leukemia) ([place]) 08/23/2018   Hx of myocardial perfusion scan 08/23/2018   Osteoarthritis 08/23/2018   Degenerative arthritis of hip 08/23/2018   Lumbar spondylosis 08/23/2018   SOB (shortness of breath) 08/23/2018   History of CVA (cerebrovascular accident)  08/23/2018   Essential hypertension 08/23/2018   Chronic right shoulder pain 08/23/2018   Complete tear of right rotator cuff 08/23/2018   Acute pain of left shoulder 08/23/2018   On antineoplastic chemotherapy 08/23/2018   Hyperlipidemia 08/23/2018   Neurogenic claudication due to lumbar spinal stenosis 08/23/2018   Vitamin D deficiency 08/23/2018    Juel Burrow, PT, DPT 08/31/2021, 1:41 PM  [facility] @ [place], [place], [address] Phone: [phone]   Fax:  [phone]  Name: Pamela Cox MRN: [id number] Date of Birth: 03-Apr-1941

## 2021-09-03 ENCOUNTER — Other Ambulatory Visit: Payer: Self-pay

## 2021-09-03 ENCOUNTER — Encounter: Payer: Self-pay | Admitting: Rehabilitative and Restorative Service Providers"

## 2021-09-03 ENCOUNTER — Ambulatory Visit: Payer: Medicare HMO | Admitting: Rehabilitative and Restorative Service Providers"

## 2021-09-03 DIAGNOSIS — M25561 Pain in right knee: Secondary | ICD-10-CM

## 2021-09-03 DIAGNOSIS — M25511 Pain in right shoulder: Secondary | ICD-10-CM

## 2021-09-03 DIAGNOSIS — G8929 Other chronic pain: Secondary | ICD-10-CM

## 2021-09-03 DIAGNOSIS — R262 Difficulty in walking, not elsewhere classified: Secondary | ICD-10-CM

## 2021-09-03 DIAGNOSIS — M25562 Pain in left knee: Secondary | ICD-10-CM

## 2021-09-03 DIAGNOSIS — M6281 Muscle weakness (generalized): Secondary | ICD-10-CM

## 2021-09-03 NOTE — Therapy (Signed)
[facility] @ [place], [place], [address] Phone: [phone]   Fax:  [phone]  Physical Therapy Treatment  Patient Details  Name: Pamela Cox MRN: [id number] Date of Birth: 11/19/1941 Referring Provider (PT): Dr Kizzie Bane   Encounter Date: 09/03/2021   PT End of Session - 09/03/21 0804     Visit Number 2    Date for PT Re-Evaluation 10/23/21    Authorization Type Aetna Medicare    PT Start Time 0800    PT Stop Time 0840    PT Time Calculation (min) 40 min    Activity Tolerance Patient tolerated treatment well    Behavior During Therapy [facility] for tasks assessed/performed             Past Medical History:  Diagnosis Date   Arthritis    CML (chronic myelocytic leukemia) ([place])    CVA (cerebral vascular accident) ([place])    Diabetes mellitus without complication ([place])    GERD (gastroesophageal reflux disease)    Hypertension    Myelocytic leukemia, chronic ([place])    Obesity    Sleep apnea     Past Surgical History:  Procedure Laterality Date   APPENDECTOMY     COLONOSCOPY     COLONOSCOPY WITH PROPOFOL N/A 07/16/2019   Procedure: COLONOSCOPY WITH PROPOFOL;  Surgeon: Lollie Sails, MD;  Location: [facility] ENDOSCOPY;  Service: Endoscopy;  Laterality: N/A;   ESOPHAGOGASTRODUODENOSCOPY (EGD) WITH PROPOFOL N/A 07/16/2019   Procedure: ESOPHAGOGASTRODUODENOSCOPY (EGD) WITH PROPOFOL;  Surgeon: Lollie Sails, MD;  Location: [facility] ENDOSCOPY;  Service: Endoscopy;  Laterality: N/A;   TUBAL LIGATION      There were no vitals filed for this visit.   Subjective Assessment - 09/03/21 0803     Subjective I am having a good day.    Currently in Pain? No/denies                               [facility] Adult PT Treatment/Exercise - 09/03/21 0001       Knee/Hip Exercises: Aerobic   Nustep L4 x6 min      Knee/Hip Exercises: Standing   Hip Flexion Stengthening;Both;1 set;10  reps;Knee straight    Hip Abduction Stengthening;Both;2 sets;10 reps;Knee straight    Other Standing Knee Exercises Alt toe taps to 4" step 2x10      Knee/Hip Exercises: Seated   Long Arc Quad Strengthening;Both;2 sets;10 reps    Long Arc Quad Limitations 2.5#    Clamshell with TheraBand Red   2x10   Marching Strengthening;Both;2 sets;10 reps    Marching Limitations 2.5#    Hamstring Curl Strengthening;Both;2 sets;10 reps    Hamstring Limitations red tband    Sit to [employer] 10 reps;with UE support                       PT Short Term Goals - 09/03/21 0843       PT SHORT TERM GOAL #1   Title Pt will be independent with initial HEP.    Status Partially Met               PT Long Term Goals - 08/31/21 1337       PT LONG TERM GOAL #1   Title Pt will be independent with advanced HEP.    Time 8    Period Weeks    Status New  PT LONG TERM GOAL #2   Title Pt will increase BLE strength to at least 4+/5 to allow her to perform functional activities.    Time 8    Period Weeks    Status New      PT LONG TERM GOAL #3   Title FOTO score will increase to 50% to demo improved functional use of B knees.    Time 8    Period Weeks    Status New      PT LONG TERM GOAL #4   Title Pt will be able to return to household and short distance community ambulation with rollator with good stability of Rt UE and min/no pain in Rt shoulder    Time 8    Period Weeks                   Plan - 09/03/21 0840     Clinical Impression Statement Ms Grumbine tolerated ther ex without increased pain.  Pt requires UE support with all standing ther ex secondary to decreased balance. She had some fatigue during ther ex and required brief seated recovery periods. Pt requires some cuing to slow down for improved eccentric muscle control.    PT Treatment/Interventions ADLs/Self Care Home Management;Cryotherapy;Electrical Stimulation;Iontophoresis 4mg /ml Dexamethasone;Moist  Heat;Ultrasound;Gait training;Stair training;Therapeutic activities;Functional mobility training;Therapeutic exercise;Balance training;Patient/family education;Manual techniques;Passive range of motion;Dry needling;Taping;Vasopneumatic Device;Joint Manipulations    PT Next Visit Plan assess and progress HEP. strengthening    Consulted and Agree with Plan of Care Patient             Patient will benefit from skilled therapeutic intervention in order to improve the following deficits and impairments:  Difficulty walking, Increased muscle spasms, Obesity, Increased edema, Decreased activity tolerance, Decreased strength, Pain  Visit Diagnosis: Chronic pain of left knee  Chronic pain of right knee  Muscle weakness (generalized)  Difficulty in walking, not elsewhere classified  Chronic right shoulder pain     Problem List Patient Active Problem List   Diagnosis Date Noted   Allergic rhinitis due to pollen 08/23/2018   DDD (degenerative disc disease), lumbar 08/23/2018   Lumbar neuritis 08/23/2018   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   GERD (gastroesophageal reflux disease) 08/23/2018   Morbid obesity ([place]) 08/23/2018   Obstructive sleep apnea 08/23/2018   Intervertebral disc disorder with radiculopathy of lumbar region 08/23/2018   CML (chronic myelocytic leukemia) ([place]) 08/23/2018   Hx of myocardial perfusion scan 08/23/2018   Osteoarthritis 08/23/2018   Degenerative arthritis of hip 08/23/2018   Lumbar spondylosis 08/23/2018   SOB (shortness of breath) 08/23/2018   History of CVA (cerebrovascular accident) 08/23/2018   Essential hypertension 08/23/2018   Chronic right shoulder pain 08/23/2018   Complete tear of right rotator cuff 08/23/2018   Acute pain of left shoulder 08/23/2018   On antineoplastic chemotherapy 08/23/2018   Hyperlipidemia 08/23/2018   Neurogenic claudication due to lumbar spinal stenosis 08/23/2018   Vitamin D deficiency 08/23/2018    Juel Burrow,  PT, DPT 09/03/2021, 8:46 AM  [facility] @ [place], [place], [address] Phone: [phone]   Fax:  [phone]  Name: Brynja Marker MRN: [id number] Date of Birth: [birth year]

## 2021-09-07 ENCOUNTER — Ambulatory Visit: Payer: Medicare HMO | Admitting: Rehabilitative and Restorative Service Providers"

## 2021-09-07 ENCOUNTER — Encounter: Payer: Self-pay | Admitting: Rehabilitative and Restorative Service Providers"

## 2021-09-07 ENCOUNTER — Other Ambulatory Visit: Payer: Self-pay

## 2021-09-07 DIAGNOSIS — M6281 Muscle weakness (generalized): Secondary | ICD-10-CM

## 2021-09-07 DIAGNOSIS — R262 Difficulty in walking, not elsewhere classified: Secondary | ICD-10-CM

## 2021-09-07 DIAGNOSIS — G8929 Other chronic pain: Secondary | ICD-10-CM | POA: Diagnosis not present

## 2021-09-07 DIAGNOSIS — M25562 Pain in left knee: Secondary | ICD-10-CM

## 2021-09-07 NOTE — Therapy (Signed)
[facility] @ [place], [place], [address] Phone: [phone]   Fax:  [phone]  Physical Therapy Treatment  Patient Details  Name: Pamela Cox MRN: [id number] Date of Birth: July 25, 1941 Referring Provider (PT): Dr Kizzie Bane   Encounter Date: 09/07/2021   PT End of Session - 09/07/21 0800     Visit Number 3    Date for PT Re-Evaluation 10/23/21    Authorization Type [employer] Medicare    PT Start Time 0800    PT Stop Time 0840    PT Time Calculation (min) 40 min    Activity Tolerance Patient tolerated treatment well    Behavior During Therapy [facility] for tasks assessed/performed             Past Medical History:  Diagnosis Date   Arthritis    CML (chronic myelocytic leukemia) ([place])    CVA (cerebral vascular accident) ([place])    Diabetes mellitus without complication ([place])    GERD (gastroesophageal reflux disease)    Hypertension    Myelocytic leukemia, chronic ([place])    Obesity    Sleep apnea     Past Surgical History:  Procedure Laterality Date   APPENDECTOMY     COLONOSCOPY     COLONOSCOPY WITH PROPOFOL N/A 07/16/2019   Procedure: COLONOSCOPY WITH PROPOFOL;  Surgeon: Lollie Sails, MD;  Location: [facility] ENDOSCOPY;  Service: Endoscopy;  Laterality: N/A;   ESOPHAGOGASTRODUODENOSCOPY (EGD) WITH PROPOFOL N/A 07/16/2019   Procedure: ESOPHAGOGASTRODUODENOSCOPY (EGD) WITH PROPOFOL;  Surgeon: Lollie Sails, MD;  Location: [facility] ENDOSCOPY;  Service: Endoscopy;  Laterality: N/A;   TUBAL LIGATION      There were no vitals filed for this visit.   Subjective Assessment - 09/07/21 0759     Subjective I am doing great.  I had a little dizziness, but I am okay.    Patient Stated Goals To be stronger and be able to walk better.    Currently in Pain? No/denies                               [facility] Adult PT Treatment/Exercise - 09/07/21 0001       Transfers   Five time sit  to stand comments  23.3 sec with limited use of UE      Ambulation/Gait   Gait Comments Amb around PT clinic with RW and slow gait velocity, no complaints of increased pain.      Standardized Balance Assessment   Standardized Balance Assessment Timed Up and Go Test      Timed Up and Go Test   TUG Normal TUG    Normal TUG (seconds) 28.1   with RW     Knee/Hip Exercises: Aerobic   Nustep L5 x6 min      Knee/Hip Exercises: Standing   Hip Flexion Stengthening;Both;1 set;10 reps;Knee straight    Hip Abduction Stengthening;Both;1 set;10 reps    Other Standing Knee Exercises Alt toe taps to 6" step x10      Knee/Hip Exercises: Seated   Long Arc Quad Strengthening;Both;2 sets;10 reps    Long Arc Quad Weight 3 lbs.    Clamshell with TheraBand Red   2x10   Marching Strengthening;Both;2 sets;10 reps    Marching Limitations 3#    Hamstring Curl Strengthening;Both;2 sets;10 reps    Hamstring Limitations red tband  PT Short Term Goals - 09/07/21 0834       PT SHORT TERM GOAL #1   Title Pt will be independent with initial HEP.    Status Achieved               PT Long Term Goals - 09/07/21 0835       PT LONG TERM GOAL #1   Title Pt will be independent with advanced HEP.    Status On-going      PT LONG TERM GOAL #2   Title Pt will increase BLE strength to at least 4+/5 to allow her to perform functional activities.    Status On-going      PT LONG TERM GOAL #3   Title FOTO score will increase to 50% to demo improved functional use of B knees.    Status On-going      PT LONG TERM GOAL #4   Title Pt will be able to return to household and short distance community ambulation with rollator with good stability of Rt UE and min/no pain in Rt shoulder    Status On-going                   Plan - 09/07/21 0832     Clinical Impression Statement Pamela Cox is progressing with goal related activities. She has improved with her time on 5  times sit to/from stand. She requires cuing on NuStep for a slower pace, as she was going too fast initially and causing increased fatigue. Pt continues to require UE support with standing ther ex for increased stability.    PT Treatment/Interventions ADLs/Self Care Home Management;Cryotherapy;Electrical Stimulation;Iontophoresis 4mg /ml Dexamethasone;Moist Heat;Ultrasound;Gait training;Stair training;Therapeutic activities;Functional mobility training;Therapeutic exercise;Balance training;Patient/family education;Manual techniques;Passive range of motion;Dry needling;Taping;Vasopneumatic Device;Joint Manipulations    PT Next Visit Plan assess and progress HEP. strengthening    Consulted and Agree with Plan of Care Patient             Patient will benefit from skilled therapeutic intervention in order to improve the following deficits and impairments:  Difficulty walking, Increased muscle spasms, Obesity, Increased edema, Decreased activity tolerance, Decreased strength, Pain  Visit Diagnosis: Chronic pain of left knee  Chronic pain of right knee  Muscle weakness (generalized)  Difficulty in walking, not elsewhere classified     Problem List Patient Active Problem List   Diagnosis Date Noted   Allergic rhinitis due to pollen 08/23/2018   DDD (degenerative disc disease), lumbar 08/23/2018   Lumbar neuritis 08/23/2018   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   GERD (gastroesophageal reflux disease) 08/23/2018   Morbid obesity ([place]) 08/23/2018   Obstructive sleep apnea 08/23/2018   Intervertebral disc disorder with radiculopathy of lumbar region 08/23/2018   CML (chronic myelocytic leukemia) ([place]) 08/23/2018   Hx of myocardial perfusion scan 08/23/2018   Osteoarthritis 08/23/2018   Degenerative arthritis of hip 08/23/2018   Lumbar spondylosis 08/23/2018   SOB (shortness of breath) 08/23/2018   History of CVA (cerebrovascular accident) 08/23/2018   Essential hypertension 08/23/2018    Chronic right shoulder pain 08/23/2018   Complete tear of right rotator cuff 08/23/2018   Acute pain of left shoulder 08/23/2018   On antineoplastic chemotherapy 08/23/2018   Hyperlipidemia 08/23/2018   Neurogenic claudication due to lumbar spinal stenosis 08/23/2018   Vitamin D deficiency 08/23/2018    Pamela Burrow, PT, DPT 09/07/2021, 8:43 AM  [facility] @ [place], [place], [address] Phone: [phone]   Fax:  [phone]  Name: Pamela Cox MRN: [id number] Date of Birth: 11/30/1940

## 2021-09-10 ENCOUNTER — Encounter: Payer: Self-pay | Admitting: Rehabilitative and Restorative Service Providers"

## 2021-09-10 ENCOUNTER — Ambulatory Visit: Payer: Medicare HMO | Admitting: Rehabilitative and Restorative Service Providers"

## 2021-09-10 ENCOUNTER — Other Ambulatory Visit: Payer: Self-pay

## 2021-09-10 DIAGNOSIS — M25561 Pain in right knee: Secondary | ICD-10-CM

## 2021-09-10 DIAGNOSIS — G8929 Other chronic pain: Secondary | ICD-10-CM | POA: Diagnosis not present

## 2021-09-10 DIAGNOSIS — R262 Difficulty in walking, not elsewhere classified: Secondary | ICD-10-CM

## 2021-09-10 DIAGNOSIS — M6281 Muscle weakness (generalized): Secondary | ICD-10-CM

## 2021-09-10 NOTE — Therapy (Signed)
[facility] @ [place], [place], [address] Phone: [phone]   Fax:  [phone]  Physical Therapy Treatment  Patient Details  Name: Pamela Cox MRN: [id number] Date of Birth: 01-02-1941 Referring Provider (PT): Dr Kizzie Bane   Encounter Date: 09/10/2021   PT End of Session - 09/10/21 0801     Visit Number 4    Date for PT Re-Evaluation 10/23/21    Authorization Type A[employer]na Medicare    PT Start Time [phone]    PT Stop Time 0840    PT Time Calculation (min) 44 min    Activity Tolerance Patient tolerated treatment well    Behavior During Therapy [facility] for tasks assessed/performed             Past Medical History:  Diagnosis Date   Arthritis    CML (chronic myelocytic leukemia) ([place])    CVA (cerebral vascular accident) ([place])    Diabetes mellitus without complication ([place])    GERD (gastroesophageal reflux disease)    Hypertension    Myelocytic leukemia, chronic ([place])    Obesity    Sleep apnea     Past Surgical History:  Procedure Laterality Date   APPENDECTOMY     COLONOSCOPY     COLONOSCOPY WITH PROPOFOL N/A 07/16/2019   Procedure: COLONOSCOPY WITH PROPOFOL;  Surgeon: Lollie Sails, MD;  Location: [facility] ENDOSCOPY;  Service: Endoscopy;  Laterality: N/A;   ESOPHAGOGASTRODUODENOSCOPY (EGD) WITH PROPOFOL N/A 07/16/2019   Procedure: ESOPHAGOGASTRODUODENOSCOPY (EGD) WITH PROPOFOL;  Surgeon: Lollie Sails, MD;  Location: [facility] ENDOSCOPY;  Service: Endoscopy;  Laterality: N/A;   TUBAL LIGATION      There were no vitals filed for this visit.   Subjective Assessment - 09/10/21 0800     Subjective I am feling great.    Pertinent History R Total Sh Arthroplasty 09/17/20, CML, HTN, GERD, CVA, spinal stenosis, neurogenic claudication    Patient Stated Goals To be stronger and be able to walk better.    Currently in Pain? No/denies                               [facility]  Adult PT Treatment/Exercise - 09/10/21 0001       Transfers   Transfer Cueing Pt required max cuing when going from stand to sit to back up close to the chair where she feels her legs touching prior to reaching back to sit.      Knee/Hip Exercises: Aerobic   Nustep L5 x6 min      Knee/Hip Exercises: Standing   Knee Flexion Strengthening;Both;1 set;10 reps    Knee Flexion Limitations marching and HS curls 3# each    Hip Flexion Stengthening;Both;1 set;10 reps;Knee straight    Hip Flexion Limitations 3#    Hip Abduction Stengthening;Both;1 set;10 reps    Abduction Limitations 3#    Forward Step Up Both;1 set;10 reps;Hand Hold: 2;Step Height: 6"      Knee/Hip Exercises: Seated   Long Arc Quad Strengthening;Both;2 sets;10 reps   cuing for slower/more controlled motion and full ROM   Long Arc Quad Weight 3 lbs.    Clamshell with TheraBand Red   2x10   Marching Strengthening;Both;2 sets;10 reps    Marching Limitations 3#    Hamstring Curl Strengthening;Both;2 sets;10 reps    Hamstring Limitations red tband    Sit to Sand 10 reps;with UE support  PT Short Term Goals - 09/07/21 0834       PT SHORT TERM GOAL #1   Title Pt will be independent with initial HEP.    Status Achieved               PT Long Term Goals - 09/07/21 0835       PT LONG TERM GOAL #1   Title Pt will be independent with advanced HEP.    Status On-going      PT LONG TERM GOAL #2   Title Pt will increase BLE strength to at least 4+/5 to allow her to perform functional activities.    Status On-going      PT LONG TERM GOAL #3   Title FOTO score will increase to 50% to demo improved functional use of B knees.    Status On-going      PT LONG TERM GOAL #4   Title Pt will be able to return to household and short distance community ambulation with rollator with good stability of Rt UE and min/no pain in Rt shoulder    Status On-going                   Plan -  09/10/21 0840     Clinical Impression Statement [name] tolerated session well. She is progressing towards increased strength, but continues to require recovery periods between standing exercises. Pt  requires cuing today for increased safety with stand to sit transfers to ensure that she gets close enough to seating surface to feel it on her legs before initaiting transfer. Pt requires heavy reliance on UE with step up exercise.    PT Treatment/Interventions ADLs/Self Care Home Management;Cryotherapy;Electrical Stimulation;Iontophoresis 4mg /ml Dexamethasone;Moist Heat;Ultrasound;Gait training;Stair training;Therapeutic activities;Functional mobility training;Therapeutic exercise;Balance training;Patient/family education;Manual techniques;Passive range of motion;Dry needling;Taping;Vasopneumatic Device;Joint Manipulations    PT Next Visit Plan assess and progress HEP. strengthening    Consulted and Agree with Plan of Care Patient             Patient will benefit from skilled therapeutic intervention in order to improve the following deficits and impairments:  Difficulty walking, Increased muscle spasms, Obesity, Increased edema, Decreased activity tolerance, Decreased strength, Pain  Visit Diagnosis: Chronic pain of left knee  Chronic pain of right knee  Muscle weakness (generalized)  Difficulty in walking, not elsewhere classified  Chronic right shoulder pain     Problem List Patient Active Problem List   Diagnosis Date Noted   Allergic rhinitis due to pollen 08/23/2018   DDD (degenerative disc disease), lumbar 08/23/2018   Lumbar neuritis 08/23/2018   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   GERD (gastroesophageal reflux disease) 08/23/2018   Morbid obesity ([place]) 08/23/2018   Obstructive sleep apnea 08/23/2018   Intervertebral disc disorder with radiculopathy of lumbar region 08/23/2018   CML (chronic myelocytic leukemia) ([place]) 08/23/2018   Hx of myocardial perfusion scan  08/23/2018   Osteoarthritis 08/23/2018   Degenerative arthritis of hip 08/23/2018   Lumbar spondylosis 08/23/2018   SOB (shortness of breath) 08/23/2018   History of CVA (cerebrovascular accident) 08/23/2018   Essential hypertension 08/23/2018   Chronic right shoulder pain 08/23/2018   Complete tear of right rotator cuff 08/23/2018   Acute pain of left shoulder 08/23/2018   On antineoplastic chemotherapy 08/23/2018   Hyperlipidemia 08/23/2018   Neurogenic claudication due to lumbar spinal stenosis 08/23/2018   Vitamin D deficiency 08/23/2018    Juel Burrow, PT, DPT 09/10/2021, 8:50 AM  [facility] @  [place], [place], [address] Phone: [phone]   Fax:  [phone]  Name: Andreyah Natividad MRN: [id number] Date of Birth: 12-30-1940

## 2021-09-14 ENCOUNTER — Encounter: Payer: Self-pay | Admitting: Physical Therapy

## 2021-09-14 ENCOUNTER — Other Ambulatory Visit: Payer: Self-pay

## 2021-09-14 ENCOUNTER — Ambulatory Visit: Payer: Medicare HMO | Admitting: Physical Therapy

## 2021-09-14 DIAGNOSIS — M25562 Pain in left knee: Secondary | ICD-10-CM

## 2021-09-14 DIAGNOSIS — G8929 Other chronic pain: Secondary | ICD-10-CM

## 2021-09-14 DIAGNOSIS — R293 Abnormal posture: Secondary | ICD-10-CM

## 2021-09-14 DIAGNOSIS — R262 Difficulty in walking, not elsewhere classified: Secondary | ICD-10-CM

## 2021-09-14 DIAGNOSIS — M6281 Muscle weakness (generalized): Secondary | ICD-10-CM

## 2021-09-14 NOTE — Therapy (Signed)
[facility] @ [place], [place], [address] Phone: [phone]   Fax:  [phone]  Physical Therapy Treatment  Patient Details  Name: Pamela Cox MRN: [id number] Date of Birth: [birth year] Referring Provider (PT): Dr Kizzie Bane   Encounter Date: 09/14/2021   PT End of Session - 09/14/21 1357     Visit Number 5    Date for PT Re-Evaluation 10/23/21    Authorization Type Aetna Medicare    PT Start Time 1357    PT Stop Time 1440    PT Time Calculation (min) 43 min    Activity Tolerance Patient tolerated treatment well    Behavior During Therapy [facility] for tasks assessed/performed             Past Medical History:  Diagnosis Date   Arthritis    CML (chronic myelocytic leukemia) ([place])    CVA (cerebral vascular accident) ([place])    Diabetes mellitus without complication ([place])    GERD (gastroesophageal reflux disease)    Hypertension    Myelocytic leukemia, chronic ([place])    Obesity    Sleep apnea     Past Surgical History:  Procedure Laterality Date   APPENDECTOMY     COLONOSCOPY     COLONOSCOPY WITH PROPOFOL N/A 07/16/2019   Procedure: COLONOSCOPY WITH PROPOFOL;  Surgeon: Lollie Sails, MD;  Location: [facility] ENDOSCOPY;  Service: Endoscopy;  Laterality: N/A;   ESOPHAGOGASTRODUODENOSCOPY (EGD) WITH PROPOFOL N/A 07/16/2019   Procedure: ESOPHAGOGASTRODUODENOSCOPY (EGD) WITH PROPOFOL;  Surgeon: Lollie Sails, MD;  Location: [facility] ENDOSCOPY;  Service: Endoscopy;  Laterality: N/A;   TUBAL LIGATION      There were no vitals filed for this visit.                      [facility] Adult PT Treatment/Exercise - 09/14/21 0001       Knee/Hip Exercises: Aerobic   Nustep L5 x 8 min with PTA present to discuss status      Knee/Hip Exercises: Standing   Knee Flexion Strengthening;Both;2 sets;10 reps   3#   Hip Flexion Stengthening;Both;10 reps;Knee bent;2 sets    Hip Flexion Limitations  3#    Hip Abduction Stengthening;Both;2 sets;10 reps;Knee straight    Abduction Limitations 3#    Forward Step Up Both;1 set;10 reps;Hand Hold: 2;Step Height: 6"    Gait Training 3 min walk with RW 2x slight SOB at conclusion      Knee/Hip Exercises: Seated   Long Arc Quad Strengthening;Both;2 sets;15 reps;Weights    Long Arc Quad Weight 3 lbs.    Clamshell with TheraBand --   yellow loop 20x   Sit to Sand 10 reps;with UE support   1 UE only                      PT Short Term Goals - 09/07/21 0834       PT SHORT TERM GOAL #1   Title Pt will be independent with initial HEP.    Status Achieved               PT Long Term Goals - 09/07/21 0835       PT LONG TERM GOAL #1   Title Pt will be independent with advanced HEP.    Status On-going      PT LONG TERM GOAL #2   Title Pt will increase BLE strength to at least 4+/5 to allow her  to perform functional activities.    Status On-going      PT LONG TERM GOAL #3   Title FOTO score will increase to 50% to demo improved functional use of B knees.    Status On-going      PT LONG TERM GOAL #4   Title Pt will be able to return to household and short distance community ambulation with rollator with good stability of Rt UE and min/no pain in Rt shoulder    Status On-going                   Plan - 09/14/21 1440     Clinical Impression Statement Pt arrives pain free and feeling "good." Pt was able to ambulate for 3 min 2x with approx2 min rest break inbetween the bouts. PTA kept loads the same as pt said she was a bit sore after using them for the first time. Mild fatigue with treatment.    Personal Factors and Comorbidities Age    Examination-Activity Limitations Stairs;Continence    Examination-Participation Restrictions Community Activity    Stability/Clinical Decision Making Stable/Uncomplicated    Rehab Potential Good    PT Frequency 2x / week    PT Duration 8 weeks    PT Treatment/Interventions  ADLs/Self Care Home Management;Cryotherapy;Electrical Stimulation;Iontophoresis 4mg /ml Dexamethasone;Moist Heat;Ultrasound;Gait training;Stair training;Therapeutic activities;Functional mobility training;Therapeutic exercise;Balance training;Patient/family education;Manual techniques;Passive range of motion;Dry needling;Taping;Vasopneumatic Device;Joint Manipulations    PT Next Visit Plan assess and progress HEP. strengthening    PT Home Exercise Plan Access Code: [email or web address]    Consulted and Agree with Plan of Care Patient             Patient will benefit from skilled therapeutic intervention in order to improve the following deficits and impairments:  Difficulty walking, Increased muscle spasms, Obesity, Increased edema, Decreased activity tolerance, Decreased strength, Pain  Visit Diagnosis: Chronic pain of left knee  Chronic pain of right knee  Muscle weakness (generalized)  Difficulty in walking, not elsewhere classified  Abnormal posture     Problem List Patient Active Problem List   Diagnosis Date Noted   Allergic rhinitis due to pollen 08/23/2018   DDD (degenerative disc disease), lumbar 08/23/2018   Lumbar neuritis 08/23/2018   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   GERD (gastroesophageal reflux disease) 08/23/2018   Morbid obesity ([place]) 08/23/2018   Obstructive sleep apnea 08/23/2018   Intervertebral disc disorder with radiculopathy of lumbar region 08/23/2018   CML (chronic myelocytic leukemia) ([place]) 08/23/2018   Hx of myocardial perfusion scan 08/23/2018   Osteoarthritis 08/23/2018   Degenerative arthritis of hip 08/23/2018   Lumbar spondylosis 08/23/2018   SOB (shortness of breath) 08/23/2018   History of CVA (cerebrovascular accident) 08/23/2018   Essential hypertension 08/23/2018   Chronic right shoulder pain 08/23/2018   Complete tear of right rotator cuff 08/23/2018   Acute pain of left shoulder 08/23/2018   On antineoplastic chemotherapy 08/23/2018    Hyperlipidemia 08/23/2018   Neurogenic claudication due to lumbar spinal stenosis 08/23/2018   Vitamin D deficiency 08/23/2018    Kaiesha Tonner, PTA 09/14/2021, 2:44 PM  [facility] @ [place], [place], [address] Phone: [phone]   Fax:  [phone]  Name: Pamela Cox MRN: [id number] Date of Birth: 1941-05-08

## 2021-09-17 ENCOUNTER — Ambulatory Visit: Payer: Medicare HMO | Admitting: Rehabilitative and Restorative Service Providers"

## 2021-09-17 ENCOUNTER — Encounter: Payer: Self-pay | Admitting: Rehabilitative and Restorative Service Providers"

## 2021-09-17 ENCOUNTER — Other Ambulatory Visit: Payer: Self-pay

## 2021-09-17 DIAGNOSIS — M25561 Pain in right knee: Secondary | ICD-10-CM

## 2021-09-17 DIAGNOSIS — G8929 Other chronic pain: Secondary | ICD-10-CM

## 2021-09-17 DIAGNOSIS — R262 Difficulty in walking, not elsewhere classified: Secondary | ICD-10-CM

## 2021-09-17 DIAGNOSIS — M6281 Muscle weakness (generalized): Secondary | ICD-10-CM

## 2021-09-17 DIAGNOSIS — R293 Abnormal posture: Secondary | ICD-10-CM

## 2021-09-17 NOTE — Therapy (Signed)
[facility] @ [place], [place], [address] Phone: [phone]   Fax:  [phone]  Physical Therapy Treatment  Patient Details  Name: Pamela Cox MRN: [id number] Date of Birth: 27-Nov-1940 Referring Provider (PT): Dr Kizzie Bane   Encounter Date: 09/17/2021   PT End of Session - 09/17/21 0817     Visit Number 6    Date for PT Re-Evaluation 10/23/21    Authorization Type [employer]na Medicare    PT Start Time 0807    PT Stop Time 0845    PT Time Calculation (min) 38 min    Activity Tolerance Patient tolerated treatment well    Behavior During Therapy [facility] for tasks assessed/performed             Past Medical History:  Diagnosis Date   Arthritis    CML (chronic myelocytic leukemia) ([place])    CVA (cerebral vascular accident) ([place])    Diabetes mellitus without complication ([place])    GERD (gastroesophageal reflux disease)    Hypertension    Myelocytic leukemia, chronic ([place])    Obesity    Sleep apnea     Past Surgical History:  Procedure Laterality Date   APPENDECTOMY     COLONOSCOPY     COLONOSCOPY WITH PROPOFOL N/A 07/16/2019   Procedure: COLONOSCOPY WITH PROPOFOL;  Surgeon: Lollie Sails, MD;  Location: [facility] ENDOSCOPY;  Service: Endoscopy;  Laterality: N/A;   ESOPHAGOGASTRODUODENOSCOPY (EGD) WITH PROPOFOL N/A 07/16/2019   Procedure: ESOPHAGOGASTRODUODENOSCOPY (EGD) WITH PROPOFOL;  Surgeon: Lollie Sails, MD;  Location: [facility] ENDOSCOPY;  Service: Endoscopy;  Laterality: N/A;   TUBAL LIGATION      There were no vitals filed for this visit.   Subjective Assessment - 09/17/21 0817     Subjective I was really sore after last time.    Pertinent History R Total Sh Arthroplasty 09/17/20, CML, HTN, GERD, CVA, spinal stenosis, neurogenic claudication    Patient Stated Goals To be stronger and be able to walk better.    Currently in Pain? No/denies                                [facility] Adult PT Treatment/Exercise - 09/17/21 0001       Standardized Balance Assessment   Standardized Balance Assessment Timed Up and Go Test      Timed Up and Go Test   TUG Normal TUG    Normal TUG (seconds) 19.4   with RW     Knee/Hip Exercises: Aerobic   Nustep L5 x6 min      Knee/Hip Exercises: Standing   Knee Flexion Strengthening;Both;2 sets;10 reps    Knee Flexion Limitations marching and HS curls 3# each    Hip Flexion Stengthening;Both;2 sets;10 reps;Knee straight    Hip Flexion Limitations 3#    Hip Abduction Stengthening;Both;2 sets;10 reps;Knee straight   cuing to maintain proper neutral alignment and not hip ER   Abduction Limitations 3#    Forward Step Up Both;1 set;10 reps;Hand Hold: 2;Step Height: 6"    Gait Training 3 min walk test:  490 ft with RW      Knee/Hip Exercises: Seated   Long Arc Quad Strengthening;Both;2 sets;15 reps;Weights    Long Arc Quad Weight 3 lbs.    Sit to Sand 10 reps;with UE support   cuing to only utilize one UE  PT Short Term Goals - 09/07/21 0834       PT SHORT TERM GOAL #1   Title Pt will be independent with initial HEP.    Status Achieved               PT Long Term Goals - 09/17/21 0858       PT LONG TERM GOAL #1   Title Pt will be independent with advanced HEP.    Status On-going      PT LONG TERM GOAL #2   Title Pt will increase BLE strength to at least 4+/5 to allow her to perform functional activities.    Status On-going      PT LONG TERM GOAL #3   Title FOTO score will increase to 50% to demo improved functional use of B knees.    Status On-going      PT LONG TERM GOAL #4   Title Pt will be able to return to household and short distance community ambulation with rollator with good stability of Rt UE and min/no pain in Rt shoulder    Status On-going                   Plan - 09/17/21 0852     Clinical Impression Statement  Ms Lichty continues to progress towards goal related activities.  She was able to ambulate 490 ft with 3 minute walk test and has improved her time on TUG.  Pt continues to require cuing for safety throughout session.  Pt with some fatigue following session.    PT Treatment/Interventions ADLs/Self Care Home Management;Cryotherapy;Electrical Stimulation;Iontophoresis 4mg /ml Dexamethasone;Moist Heat;Ultrasound;Gait training;Stair training;Therapeutic activities;Functional mobility training;Therapeutic exercise;Balance training;Patient/family education;Manual techniques;Passive range of motion;Dry needling;Taping;Vasopneumatic Device;Joint Manipulations    PT Next Visit Plan assess and progress HEP. strengthening    Consulted and Agree with Plan of Care Patient             Patient will benefit from skilled therapeutic intervention in order to improve the following deficits and impairments:  Difficulty walking, Increased muscle spasms, Obesity, Increased edema, Decreased activity tolerance, Decreased strength, Pain  Visit Diagnosis: Chronic pain of left knee  Chronic pain of right knee  Muscle weakness (generalized)  Difficulty in walking, not elsewhere classified  Abnormal posture     Problem List Patient Active Problem List   Diagnosis Date Noted   Allergic rhinitis due to pollen 08/23/2018   DDD (degenerative disc disease), lumbar 08/23/2018   Lumbar neuritis 08/23/2018   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   GERD (gastroesophageal reflux disease) 08/23/2018   Morbid obesity ([place]) 08/23/2018   Obstructive sleep apnea 08/23/2018   Intervertebral disc disorder with radiculopathy of lumbar region 08/23/2018   CML (chronic myelocytic leukemia) ([place]) 08/23/2018   Hx of myocardial perfusion scan 08/23/2018   Osteoarthritis 08/23/2018   Degenerative arthritis of hip 08/23/2018   Lumbar spondylosis 08/23/2018   SOB (shortness of breath) 08/23/2018   History of CVA (cerebrovascular  accident) 08/23/2018   Essential hypertension 08/23/2018   Chronic right shoulder pain 08/23/2018   Complete tear of right rotator cuff 08/23/2018   Acute pain of left shoulder 08/23/2018   On antineoplastic chemotherapy 08/23/2018   Hyperlipidemia 08/23/2018   Neurogenic claudication due to lumbar spinal stenosis 08/23/2018   Vitamin D deficiency 08/23/2018    Juel Burrow, PT, DPT 09/17/2021, 9:00 AM  [facility] @ [place], [place], [address] Phone: [phone]   Fax:  [phone]  Name: Pamela  Deerica Cox MRN: [id number] Date of Birth: 1941/11/18

## 2021-09-21 ENCOUNTER — Encounter: Payer: Medicare HMO | Admitting: Physical Therapy

## 2021-09-24 ENCOUNTER — Encounter: Payer: Self-pay | Admitting: Rehabilitative and Restorative Service Providers"

## 2021-09-24 ENCOUNTER — Other Ambulatory Visit: Payer: Self-pay

## 2021-09-24 ENCOUNTER — Ambulatory Visit: Payer: Medicare HMO | Attending: Orthopedic Surgery | Admitting: Rehabilitative and Restorative Service Providers"

## 2021-09-24 DIAGNOSIS — M5442 Lumbago with sciatica, left side: Secondary | ICD-10-CM | POA: Insufficient documentation

## 2021-09-24 DIAGNOSIS — G8929 Other chronic pain: Secondary | ICD-10-CM | POA: Diagnosis present

## 2021-09-24 DIAGNOSIS — M25562 Pain in left knee: Secondary | ICD-10-CM | POA: Diagnosis present

## 2021-09-24 DIAGNOSIS — M25561 Pain in right knee: Secondary | ICD-10-CM | POA: Diagnosis present

## 2021-09-24 DIAGNOSIS — M6281 Muscle weakness (generalized): Secondary | ICD-10-CM | POA: Diagnosis present

## 2021-09-24 DIAGNOSIS — M5441 Lumbago with sciatica, right side: Secondary | ICD-10-CM | POA: Diagnosis present

## 2021-09-24 DIAGNOSIS — R293 Abnormal posture: Secondary | ICD-10-CM | POA: Diagnosis present

## 2021-09-24 DIAGNOSIS — R262 Difficulty in walking, not elsewhere classified: Secondary | ICD-10-CM | POA: Diagnosis present

## 2021-09-24 NOTE — Therapy (Signed)
[facility] @ [place], [place], [address] Phone: [phone]   Fax:  [phone]  Physical Therapy Treatment  Patient Details  Name: Pamela Cox MRN: [id number] Date of Birth: 1941/11/21 Referring Provider (PT): Dr Kizzie Bane   Encounter Date: 09/24/2021   PT End of Session - 09/24/21 0813     Visit Number 7    Date for PT Re-Evaluation 10/23/21    Authorization Type Aetna Medicare    PT Start Time 0802    PT Stop Time 0842    PT Time Calculation (min) 40 min    Activity Tolerance Patient tolerated treatment well    Behavior During Therapy [facility] for tasks assessed/performed             Past Medical History:  Diagnosis Date   Arthritis    CML (chronic myelocytic leukemia) ([place])    CVA (cerebral vascular accident) ([place])    Diabetes mellitus without complication ([place])    GERD (gastroesophageal reflux disease)    Hypertension    Myelocytic leukemia, chronic ([place])    Obesity    Sleep apnea     Past Surgical History:  Procedure Laterality Date   APPENDECTOMY     COLONOSCOPY     COLONOSCOPY WITH PROPOFOL N/A 07/16/2019   Procedure: COLONOSCOPY WITH PROPOFOL;  Surgeon: Lollie Sails, MD;  Location: [facility] ENDOSCOPY;  Service: Endoscopy;  Laterality: N/A;   ESOPHAGOGASTRODUODENOSCOPY (EGD) WITH PROPOFOL N/A 07/16/2019   Procedure: ESOPHAGOGASTRODUODENOSCOPY (EGD) WITH PROPOFOL;  Surgeon: Lollie Sails, MD;  Location: [facility] ENDOSCOPY;  Service: Endoscopy;  Laterality: N/A;   TUBAL LIGATION      There were no vitals filed for this visit.   Subjective Assessment - 09/24/21 0813     Subjective I was hurting last night, but I feel good today.    Currently in Pain? No/denies                               [facility] Adult PT Treatment/Exercise - 09/24/21 0001       Transfers   Five time sit to stand comments  23.3 sec with UE use      Knee/Hip Exercises: Aerobic   Nustep  L5 x6 min   404 steps     Knee/Hip Exercises: Standing   Heel Raises Both    Knee Flexion Strengthening;Both;2 sets;10 reps    Knee Flexion Limitations marching and HS curls 3# each    Hip Flexion Stengthening;Both;2 sets;10 reps;Knee straight    Hip Flexion Limitations 3#    Hip Abduction Stengthening;Both;2 sets;10 reps;Knee straight    Abduction Limitations 3#    Forward Step Up Both;1 set;10 reps;Hand Hold: 2;Step Height: 6"      Knee/Hip Exercises: Seated   Long Arc Quad Strengthening;Both;2 sets;15 reps;Weights    Long Arc Quad Weight 3 lbs.    Sit to Sand 10 reps;with UE support                       PT Short Term Goals - 09/07/21 0834       PT SHORT TERM GOAL #1   Title Pt will be independent with initial HEP.    Status Achieved               PT Long Term Goals - 09/24/21 0855       PT LONG TERM GOAL #1  Title Pt will be independent with advanced HEP.    Status Partially Met      PT LONG TERM GOAL #2   Title Pt will increase BLE strength to at least 4+/5 to allow her to perform functional activities.    Status On-going      PT LONG TERM GOAL #3   Title FOTO score will increase to 50% to demo improved functional use of B knees.    Status On-going      PT LONG TERM GOAL #4   Title Pt will be able to return to household and short distance community ambulation with rollator with good stability of Rt UE and min/no pain in Rt shoulder    Status On-going                   Plan - 09/24/21 0852     Clinical Impression Statement Ms Cham continues to make progress towards goals. Pt has maintained a stable time with times sit to/from stand, but requires encouragement for increased speed. Pt with increased fatigue with standing exercises, but able to complete with minimal seated recovery period in between sets. Pt requires heavy reliance on UE during step up exercise.    PT Treatment/Interventions ADLs/Self Care Home  Management;Cryotherapy;Electrical Stimulation;Iontophoresis 4mg /ml Dexamethasone;Moist Heat;Ultrasound;Gait training;Stair training;Therapeutic activities;Functional mobility training;Therapeutic exercise;Balance training;Patient/family education;Manual techniques;Passive range of motion;Dry needling;Taping;Vasopneumatic Device;Joint Manipulations    PT Next Visit Plan assess and progress HEP. strengthening, ambulation    Consulted and Agree with Plan of Care Patient             Patient will benefit from skilled therapeutic intervention in order to improve the following deficits and impairments:  Difficulty walking, Increased muscle spasms, Obesity, Increased edema, Decreased activity tolerance, Decreased strength, Pain  Visit Diagnosis: Chronic pain of left knee  Chronic pain of right knee  Muscle weakness (generalized)  Difficulty in walking, not elsewhere classified     Problem List Patient Active Problem List   Diagnosis Date Noted   Allergic rhinitis due to pollen 08/23/2018   DDD (degenerative disc disease), lumbar 08/23/2018   Lumbar neuritis 08/23/2018   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   GERD (gastroesophageal reflux disease) 08/23/2018   Morbid obesity ([place]) 08/23/2018   Obstructive sleep apnea 08/23/2018   Intervertebral disc disorder with radiculopathy of lumbar region 08/23/2018   CML (chronic myelocytic leukemia) ([place]) 08/23/2018   Hx of myocardial perfusion scan 08/23/2018   Osteoarthritis 08/23/2018   Degenerative arthritis of hip 08/23/2018   Lumbar spondylosis 08/23/2018   SOB (shortness of breath) 08/23/2018   History of CVA (cerebrovascular accident) 08/23/2018   Essential hypertension 08/23/2018   Chronic right shoulder pain 08/23/2018   Complete tear of right rotator cuff 08/23/2018   Acute pain of left shoulder 08/23/2018   On antineoplastic chemotherapy 08/23/2018   Hyperlipidemia 08/23/2018   Neurogenic claudication due to lumbar spinal stenosis  08/23/2018   Vitamin D deficiency 08/23/2018    Juel Burrow, PT 09/24/2021, 9:17 AM  [facility] @ [place], [place], [address] Phone: [phone]   Fax:  [phone]  Name: Pamela Cox MRN: [id number] Date of Birth: 19-Mar-1941

## 2021-09-28 ENCOUNTER — Other Ambulatory Visit: Payer: Self-pay

## 2021-09-28 ENCOUNTER — Encounter: Payer: Self-pay | Admitting: Physical Therapy

## 2021-09-28 ENCOUNTER — Ambulatory Visit: Payer: Medicare HMO | Admitting: Physical Therapy

## 2021-09-28 DIAGNOSIS — R262 Difficulty in walking, not elsewhere classified: Secondary | ICD-10-CM

## 2021-09-28 DIAGNOSIS — M25562 Pain in left knee: Secondary | ICD-10-CM | POA: Diagnosis not present

## 2021-09-28 DIAGNOSIS — G8929 Other chronic pain: Secondary | ICD-10-CM

## 2021-09-28 DIAGNOSIS — R293 Abnormal posture: Secondary | ICD-10-CM

## 2021-09-28 DIAGNOSIS — M6281 Muscle weakness (generalized): Secondary | ICD-10-CM

## 2021-09-28 NOTE — Therapy (Signed)
[facility] @ [place], [place], [address] Phone: [phone]   Fax:  [phone]  Physical Therapy Treatment  Patient Details  Name: Pamela Cox MRN: [id number] Date of Birth: September 11, 1941 Referring Provider (PT): Dr Kizzie Bane   Encounter Date: 09/28/2021   PT End of Session - 09/28/21 1225     Visit Number 8    Date for PT Re-Evaluation 10/23/21    Authorization Type Aetna Medicare    PT Start Time 1224    PT Stop Time 1305    PT Time Calculation (min) 41 min    Activity Tolerance Patient tolerated treatment well    Behavior During Therapy [facility] for tasks assessed/performed             Past Medical History:  Diagnosis Date   Arthritis    CML (chronic myelocytic leukemia) ([place])    CVA (cerebral vascular accident) ([place])    Diabetes mellitus without complication ([place])    GERD (gastroesophageal reflux disease)    Hypertension    Myelocytic leukemia, chronic ([place])    Obesity    Sleep apnea     Past Surgical History:  Procedure Laterality Date   APPENDECTOMY     COLONOSCOPY     COLONOSCOPY WITH PROPOFOL N/A 07/16/2019   Procedure: COLONOSCOPY WITH PROPOFOL;  Surgeon: Lollie Sails, MD;  Location: [facility] ENDOSCOPY;  Service: Endoscopy;  Laterality: N/A;   ESOPHAGOGASTRODUODENOSCOPY (EGD) WITH PROPOFOL N/A 07/16/2019   Procedure: ESOPHAGOGASTRODUODENOSCOPY (EGD) WITH PROPOFOL;  Surgeon: Lollie Sails, MD;  Location: [facility] ENDOSCOPY;  Service: Endoscopy;  Laterality: N/A;   TUBAL LIGATION      There were no vitals filed for this visit.   Subjective Assessment - 09/28/21 1227     Subjective I am doing good.    Pertinent History R Total Sh Arthroplasty 09/17/20, CML, HTN, GERD, CVA, spinal stenosis, neurogenic claudication    Currently in Pain? No/denies                               [facility] Adult PT Treatment/Exercise - 09/28/21 0001       Knee/Hip Exercises:  Aerobic   Nustep L3 x7 min   404 steps     Knee/Hip Exercises: Standing   Heel Raises Both;1 set;10 reps    Hip Flexion Stengthening;Both;2 sets;10 reps;Knee straight    Hip Flexion Limitations 3#    Hip Abduction Stengthening;Both;2 sets;10 reps;Knee straight    Abduction Limitations 3#    Hip Extension AROM;Stengthening;Both;1 set;10 reps;Knee straight    Extension Limitations 3#    Forward Step Up Both;1 set;10 reps;Hand Hold: 2;Step Height: 6"    Gait Training Full lap of building 2x with seated rest break in between    Other Standing Knee Exercises Standing alt toe taps 10x Bil, Bil UE support      Knee/Hip Exercises: Seated   Long Arc Quad Strengthening;Both;2 sets;15 reps;Weights    Long Arc Quad Weight 3 lbs.                       PT Short Term Goals - 09/07/21 0834       PT SHORT TERM GOAL #1   Title Pt will be independent with initial HEP.    Status Achieved               PT Long Term Goals - 09/24/21 1324  PT LONG TERM GOAL #1   Title Pt will be independent with advanced HEP.    Status Partially Met      PT LONG TERM GOAL #2   Title Pt will increase BLE strength to at least 4+/5 to allow her to perform functional activities.    Status On-going      PT LONG TERM GOAL #3   Title FOTO score will increase to 50% to demo improved functional use of B knees.    Status On-going      PT LONG TERM GOAL #4   Title Pt will be able to return to household and short distance community ambulation with rollator with good stability of Rt UE and min/no pain in Rt shoulder    Status On-going                   Plan - 09/28/21 1226     Clinical Impression Statement Pt reports not being as sore after her last session, especially her knees. In general she continues to report better mobility. Pt ambulated the entire building 2x with 1 min seated rest today, min fatigue.    Personal Factors and Comorbidities Age    Examination-Activity  Limitations Stairs;Continence    Examination-Participation Restrictions Community Activity    Stability/Clinical Decision Making Stable/Uncomplicated    Rehab Potential Good    PT Frequency 2x / week    PT Duration 8 weeks    PT Treatment/Interventions ADLs/Self Care Home Management;Cryotherapy;Electrical Stimulation;Iontophoresis 4mg /ml Dexamethasone;Moist Heat;Ultrasound;Gait training;Stair training;Therapeutic activities;Functional mobility training;Therapeutic exercise;Balance training;Patient/family education;Manual techniques;Passive range of motion;Dry needling;Taping;Vasopneumatic Device;Joint Manipulations    PT Next Visit Plan assess and progress HEP. strengthening, ambulation    PT Home Exercise Plan Access Code: [email or web address]    [email or web address] and Agree with Plan of Care Patient             Patient will benefit from skilled therapeutic intervention in order to improve the following deficits and impairments:  Difficulty walking, Increased muscle spasms, Obesity, Increased edema, Decreased activity tolerance, Decreased strength, Pain  Visit Diagnosis: Chronic pain of left knee  Chronic pain of right knee  Muscle weakness (generalized)  Difficulty in walking, not elsewhere classified  Abnormal posture     Problem List Patient Active Problem List   Diagnosis Date Noted   Allergic rhinitis due to pollen 08/23/2018   DDD (degenerative disc disease), lumbar 08/23/2018   Lumbar neuritis 08/23/2018   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   GERD (gastroesophageal reflux disease) 08/23/2018   Morbid obesity ([place]) 08/23/2018   Obstructive sleep apnea 08/23/2018   Intervertebral disc disorder with radiculopathy of lumbar region 08/23/2018   CML (chronic myelocytic leukemia) ([place]) 08/23/2018   Hx of myocardial perfusion scan 08/23/2018   Osteoarthritis 08/23/2018   Degenerative arthritis of hip 08/23/2018   Lumbar spondylosis 08/23/2018   SOB (shortness of breath) 08/23/2018   History  of CVA (cerebrovascular accident) 08/23/2018   Essential hypertension 08/23/2018   Chronic right shoulder pain 08/23/2018   Complete tear of right rotator cuff 08/23/2018   Acute pain of left shoulder 08/23/2018   On antineoplastic chemotherapy 08/23/2018   Hyperlipidemia 08/23/2018   Neurogenic claudication due to lumbar spinal stenosis 08/23/2018   Vitamin D deficiency 08/23/2018    Eurydice Calixto, PTA 09/28/2021, 1:08 PM  [facility] @ [place], [place], [address] Phone: [phone]   Fax:  [phone]  Name: Pamela Cox MRN: [id number] Date of Birth: [birth year]

## 2021-10-01 ENCOUNTER — Other Ambulatory Visit: Payer: Self-pay

## 2021-10-01 ENCOUNTER — Encounter: Payer: Self-pay | Admitting: Rehabilitative and Restorative Service Providers"

## 2021-10-01 ENCOUNTER — Ambulatory Visit: Payer: Medicare HMO | Admitting: Rehabilitative and Restorative Service Providers"

## 2021-10-01 DIAGNOSIS — M25561 Pain in right knee: Secondary | ICD-10-CM

## 2021-10-01 DIAGNOSIS — M6281 Muscle weakness (generalized): Secondary | ICD-10-CM

## 2021-10-01 DIAGNOSIS — R262 Difficulty in walking, not elsewhere classified: Secondary | ICD-10-CM

## 2021-10-01 DIAGNOSIS — M25562 Pain in left knee: Secondary | ICD-10-CM | POA: Diagnosis not present

## 2021-10-01 DIAGNOSIS — G8929 Other chronic pain: Secondary | ICD-10-CM

## 2021-10-01 NOTE — Therapy (Signed)
[place] @ [address] [place], [place], [address] Phone: [phone]   Fax:  [phone]  Physical Therapy Treatment  Patient Details  Name: Pamela Cox MRN: [id number] Date of Birth: [birth year] Referring Provider (PT): Dr Kizzie Bane   Encounter Date: 10/01/2021   PT End of Session - 10/01/21 0802     Visit Number 9    Date for PT Re-Evaluation 10/23/21    Authorization Type [employer] Medicare    PT Start Time 0759    PT Stop Time 0844    PT Time Calculation (min) 45 min    Activity Tolerance Patient tolerated treatment well    Behavior During Therapy [facility] for tasks assessed/performed             Past Medical History:  Diagnosis Date   Arthritis    CML (chronic myelocytic leukemia) ([place])    CVA (cerebral vascular accident) ([place])    Diabetes mellitus without complication ([place])    GERD (gastroesophageal reflux disease)    Hypertension    Myelocytic leukemia, chronic ([place])    Obesity    Sleep apnea     Past Surgical History:  Procedure Laterality Date   APPENDECTOMY     COLONOSCOPY     COLONOSCOPY WITH PROPOFOL N/A 07/16/2019   Procedure: COLONOSCOPY WITH PROPOFOL;  Surgeon: Lollie Sails, MD;  Location: [facility] ENDOSCOPY;  Service: Endoscopy;  Laterality: N/A;   ESOPHAGOGASTRODUODENOSCOPY (EGD) WITH PROPOFOL N/A 07/16/2019   Procedure: ESOPHAGOGASTRODUODENOSCOPY (EGD) WITH PROPOFOL;  Surgeon: Lollie Sails, MD;  Location: [facility] ENDOSCOPY;  Service: Endoscopy;  Laterality: N/A;   TUBAL LIGATION      There were no vitals filed for this visit.   Subjective Assessment - 10/01/21 0802     Subjective I am not having any pain.    Patient Stated Goals To be stronger and be able to walk better.    Currently in Pain? No/denies                               [facility] Adult PT Treatment/Exercise - 10/01/21 0001       Timed Up and Go Test   TUG Normal TUG    Normal TUG (seconds)  18.8   with RW     High Level Balance   High Level Balance Comments Standing on blue foam performing ball toss, standing on blue foam performing shoulder flex and shoulder abd with 2# 2x10      Knee/Hip Exercises: Aerobic   Nustep L5 x6 min with PT present to discuss progress and provide oversight   408 steps     Knee/Hip Exercises: Standing   Heel Raises Both;1 set;10 reps    Hip Flexion Stengthening;Both;1 set;10 reps    Hip Flexion Limitations 3#    Hip Abduction Stengthening;Both;1 set;10 reps    Abduction Limitations 3#    Hip Extension Stengthening;Both;1 set;10 reps    Extension Limitations 3#    Lateral Step Up Both;1 set;10 reps;Hand Hold: 2;Step Height: 6"    Forward Step Up Both;1 set;10 reps;Hand Hold: 2;Step Height: 6"    Functional Squat 1 set;10 reps    Functional Squat Limitations in parallel bars    Gait Training Ambulation down PT hall and back with RW and 3# on each ankle      Knee/Hip Exercises: Seated   Long Arc Quad Strengthening;Both;2 sets;10 reps    Long [employer]  Weight 3 lbs.    Marching Strengthening;Both;2 sets;10 reps    Marching Limitations 3#    Abduction/Adduction  Strengthening;Both;2 sets;10 reps    Abd/Adduction Limitations hip abduct scissors with 3#                       PT Short Term Goals - 09/07/21 0834       PT SHORT TERM GOAL #1   Title Pt will be independent with initial HEP.    Status Achieved               PT Long Term Goals - 10/01/21 0856       PT LONG TERM GOAL #1   Title Pt will be independent with advanced HEP.    Status Partially Met      PT LONG TERM GOAL #2   Title Pt will increase BLE strength to at least 4+/5 to allow her to perform functional activities.    Status On-going      PT LONG TERM GOAL #4   Title Pt will be able to return to household and short distance community ambulation with rollator with good stability of Rt UE and min/no pain in Rt shoulder    Status On-going                    Plan - 10/01/21 0853     Clinical Impression Statement Ms Salminen tolerated session well.  She has demonstrated a decreased time on  her TUG with RW.  Added lateral step ups today and pt continues to have heavy reliance on UE.  Progressed standing balance today standing on blue foam and performing activites with UE.  Pt requires encouragement and cuing for upright posture, but able to complete without a loss of balance.    PT Treatment/Interventions ADLs/Self Care Home Management;Cryotherapy;Electrical Stimulation;Iontophoresis 88m/ml Dexamethasone;Moist Heat;Ultrasound;Gait training;Stair training;Therapeutic activities;Functional mobility training;Therapeutic exercise;Balance training;Patient/family education;Manual techniques;Passive range of motion;Dry needling;Taping;Vasopneumatic Device;Joint Manipulations    PT Next Visit Plan assess and progress HEP. strengthening, ambulation    Consulted and Agree with Plan of Care Patient             Patient will benefit from skilled therapeutic intervention in order to improve the following deficits and impairments:  Difficulty walking, Increased muscle spasms, Obesity, Increased edema, Decreased activity tolerance, Decreased strength, Pain  Visit Diagnosis: Chronic pain of left knee  Chronic pain of right knee  Muscle weakness (generalized)  Difficulty in walking, not elsewhere classified     Problem List Patient Active Problem List   Diagnosis Date Noted   Allergic rhinitis due to pollen 08/23/2018   DDD (degenerative disc disease), lumbar 08/23/2018   Lumbar neuritis 08/23/2018   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   GERD (gastroesophageal reflux disease) 08/23/2018   Morbid obesity ([place] 08/23/2018   Obstructive sleep apnea 08/23/2018   Intervertebral disc disorder with radiculopathy of lumbar region 08/23/2018   C[place] (ch[place] myelocytic [place]) [place] 08/23/2018   Hx of myocardial perfusion scan 08/23/2018    Osteoarthritis 08/23/2018   Degenerative arthritis of hip 08/23/2018   Lumbar spondylosis 08/23/2018   SOB (shortness of breath) 08/23/2018   History of CVA (cerebrovascular accident) 08/23/2018   Essential hypertension 08/23/2018   Chronic right shoulder pain 08/23/2018   Complete tear of right rotator cuff 08/23/2018   Acute pain of left shoulder 08/23/2018   On antineoplastic chemotherapy 08/23/2018   Hyperlipidemia 08/23/2018   Neurogenic claudication due to lumbar spinal stenosis 08/23/2018  Vitamin D deficiency 08/23/2018    Juel Burrow, PT, DPT 10/01/2021, 8:57 AM  [place] @ [place], [place], [address] Phone: [phone]   Fax:  [phone]  Name: Emmanuel Ercole MRN: [id number] Date of Birth: [birth year]

## 2021-10-05 ENCOUNTER — Other Ambulatory Visit: Payer: Self-pay

## 2021-10-05 ENCOUNTER — Encounter: Payer: Self-pay | Admitting: Rehabilitative and Restorative Service Providers"

## 2021-10-05 ENCOUNTER — Ambulatory Visit: Payer: Medicare HMO | Admitting: Rehabilitative and Restorative Service Providers"

## 2021-10-05 DIAGNOSIS — R262 Difficulty in walking, not elsewhere classified: Secondary | ICD-10-CM

## 2021-10-05 DIAGNOSIS — R293 Abnormal posture: Secondary | ICD-10-CM

## 2021-10-05 DIAGNOSIS — G8929 Other chronic pain: Secondary | ICD-10-CM

## 2021-10-05 DIAGNOSIS — M25562 Pain in left knee: Secondary | ICD-10-CM | POA: Diagnosis not present

## 2021-10-05 DIAGNOSIS — M6281 Muscle weakness (generalized): Secondary | ICD-10-CM

## 2021-10-05 NOTE — Therapy (Signed)
[facility] @ [place] [address] [place], [place], [address] Phone: [phone]   Fax:  [phone]  Physical Therapy Treatment  Patient Details  Name: Pamela Cox MRN: [id number] Date of Birth: October 19, 1941 Referring Provider (PT): Dr Guerry Bruin   Encounter Date: 10/05/2021  Progress Note  See note below for Objective Data and Assessment of Progress/Goals.       PT End of Session - 10/05/21 0806     Visit Number 10    Date for PT Re-Evaluation 10/23/21    Authorization Type Aetna Medicare    PT Start Time 0800    PT Stop Time 0840    PT Time Calculation (min) 40 min    Activity Tolerance Patient tolerated treatment well    Behavior During Therapy WFL for tasks assessed/performed             Past Medical History:  Diagnosis Date   Arthritis    CML (chronic myelocytic leukemia) ([facility])    CVA (cerebral vascular accident) ([facility])    Diabetes mellitus without complication ([facility])    GERD (gastroesophageal reflux disease)    Hypertension    Myelocytic leukemia, chronic ([facility])    Obesity    Sleep apnea     Past Surgical History:  Procedure Laterality Date   APPENDECTOMY     COLONOSCOPY     COLONOSCOPY WITH PROPOFOL N/A 07/16/2019   Procedure: COLONOSCOPY WITH PROPOFOL;  Surgeon: Christena Deem, MD;  Location: [facility] ENDOSCOPY;  Service: Endoscopy;  Laterality: N/A;   ESOPHAGOGASTRODUODENOSCOPY (EGD) WITH PROPOFOL N/A 07/16/2019   Procedure: ESOPHAGOGASTRODUODENOSCOPY (EGD) WITH PROPOFOL;  Surgeon: Christena Deem, MD;  Location: [facility] ENDOSCOPY;  Service: Endoscopy;  Laterality: N/A;   TUBAL LIGATION      There were no vitals filed for this visit.   Subjective Assessment - 10/05/21 0805     Subjective Pt reports that she is doing okay.    Patient Stated Goals To be stronger and be able to walk better.    Currently in Pain? No/denies                [facility] PT Assessment - 10/05/21 0001        Observation/Other Assessments   Focus on Therapeutic Outcomes (FOTO)  50%                           [facility] Adult PT Treatment/Exercise - 10/05/21 0001       Transfers   Five time sit to stand comments  17.5 sec with unilateral UE use      Ambulation/Gait   Gait Comments Ambulation around PT clinic with 3# on B ankles with RW.      Knee/Hip Exercises: Aerobic   Nustep L5 x6 min with PT present to discuss progress and provide oversight   464 steps     Knee/Hip Exercises: Standing   Heel Raises Both;1 set;10 reps    Heel Raises Limitations 3#    Hip Flexion Stengthening;Both;1 set;10 reps    Hip Flexion Limitations 3#    Hip Abduction Stengthening;Both;1 set;10 reps    Abduction Limitations 3#    Hip Extension Stengthening;Both;1 set;10 reps    Extension Limitations 3#    Forward Step Up Both;1 set;10 reps;Hand Hold: 2;Step Height: 6"    Functional Squat 1 set;10 reps    Functional Squat Limitations in parallel bars      Knee/Hip Exercises: Seated  Long Arc [employer] Strengthening;Both;2 sets;10 reps    Long Arc [employer] 3 lbs.                       PT Short Term Goals - 09/07/21 0834       PT SHORT TERM GOAL #1   Title Pt will be independent with initial HEP.    Status Achieved               PT Long Term Goals - 10/05/21 0848       PT LONG TERM GOAL #1   Title Pt will be independent with advanced HEP.    Status Partially Met      PT LONG TERM GOAL #2   Title Pt will increase BLE strength to at least 4+/5 to allow her to perform functional activities.    Status On-going      PT LONG TERM GOAL #3   Title FOTO score will increase to 50% to demo improved functional use of B knees.    Status Achieved      PT LONG TERM GOAL #4   Title Pt will be able to return to household and short distance community ambulation with rollator with good stability of Rt UE and min/no pain in Rt shoulder    Status On-going                    Plan - 10/05/21 0844     Clinical Impression Statement Ms Bourn continues to progress well and has improved on her FOTO score and her 5 times sit to/from stand. Pt continues to require heavy reliance on UE for functional squat and step up exercise. Pt able to more self cue herself to slow down with ther ex for increased muscle contractions. Pt with some decreased eccentric muscle control noted with stand to sit to lower height chair.    PT Treatment/Interventions ADLs/Self Care Home Management;Cryotherapy;Electrical Stimulation;Iontophoresis 4mg /ml Dexamethasone;Moist Heat;Ultrasound;Gait training;Stair training;Therapeutic activities;Functional mobility training;Therapeutic exercise;Balance training;Patient/family education;Manual techniques;Passive range of motion;Dry needling;Taping;Vasopneumatic Device;Joint Manipulations    PT Next Visit Plan assess and progress HEP. strengthening, ambulation    Consulted and Agree with Plan of Care Patient             Patient will benefit from skilled therapeutic intervention in order to improve the following deficits and impairments:  Difficulty walking, Increased muscle spasms, Obesity, Increased edema, Decreased activity tolerance, Decreased strength, Pain  Visit Diagnosis: Chronic pain of left knee  Chronic pain of right knee  Muscle weakness (generalized)  Difficulty in walking, not elsewhere classified  Abnormal posture     Problem List Patient Active Problem List   Diagnosis Date Noted   Allergic rhinitis due to pollen 08/23/2018   DDD (degenerative disc disease), lumbar 08/23/2018   Lumbar neuritis 08/23/2018   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   GERD (gastroesophageal reflux disease) 08/23/2018   Morbid obesity ([place]) 08/23/2018   Obstructive sleep apnea 08/23/2018   Intervertebral disc disorder with radiculopathy of lumbar region 08/23/2018   CML (chronic myelocytic leukemia) ([place]) 08/23/2018   Hx of myocardial  perfusion scan 08/23/2018   Osteoarthritis 08/23/2018   Degenerative arthritis of hip 08/23/2018   Lumbar spondylosis 08/23/2018   SOB (shortness of breath) 08/23/2018   History of CVA (cerebrovascular accident) 08/23/2018   Essential hypertension 08/23/2018   Chronic right shoulder pain 08/23/2018   Complete tear of right rotator cuff 08/23/2018   Acute pain of left shoulder 08/23/2018  On antineoplastic chemotherapy 08/23/2018   Hyperlipidemia 08/23/2018   Neurogenic claudication due to lumbar spinal stenosis 08/23/2018   Vitamin D deficiency 08/23/2018    Juel Burrow, PT, DPT 10/05/2021, 8:55 AM  [place] @ [place], [place], [address] Phone: [phone]   Fax:  [phone]  Name: Pamela Cox MRN: [id number] Date of Birth: Sep 13, 1941

## 2021-10-08 ENCOUNTER — Other Ambulatory Visit: Payer: Self-pay

## 2021-10-08 ENCOUNTER — Ambulatory Visit: Payer: Medicare HMO | Admitting: Rehabilitative and Restorative Service Providers"

## 2021-10-08 ENCOUNTER — Encounter: Payer: Self-pay | Admitting: Rehabilitative and Restorative Service Providers"

## 2021-10-08 DIAGNOSIS — M6281 Muscle weakness (generalized): Secondary | ICD-10-CM

## 2021-10-08 DIAGNOSIS — R262 Difficulty in walking, not elsewhere classified: Secondary | ICD-10-CM

## 2021-10-08 DIAGNOSIS — M25561 Pain in right knee: Secondary | ICD-10-CM

## 2021-10-08 DIAGNOSIS — M25562 Pain in left knee: Secondary | ICD-10-CM

## 2021-10-08 NOTE — Therapy (Signed)
[place] @ [place], [place], [address] Phone: [phone]   Fax:  [phone]  Physical Therapy Treatment  Patient Details  Name: Pamela Cox MRN: [id number] Date of Birth: Jul 14, 1941 Referring Provider (PT): Dr Kizzie Bane   Encounter Date: 10/08/2021   PT End of Session - 10/08/21 0804     Visit Number 11    Date for PT Re-Evaluation 10/23/21    Authorization Type A[employer]na Medicare    Progress Note Due on Visit 6    PT Start Time 0800    PT Stop Time 0840    PT Time Calculation (min) 40 min    Activity Tolerance Patient tolerated treatment well    Behavior During Therapy [facility] for tasks assessed/performed             Past Medical History:  Diagnosis Date   Arthritis    CML (chronic myelocytic leukemia) ([place])    CVA (cerebral vascular accident) ([place])    Diabetes mellitus without complication ([place])    GERD (gastroesophageal reflux disease)    Hypertension    Myelocytic leukemia, chronic ([place])    Obesity    Sleep apnea     Past Surgical History:  Procedure Laterality Date   APPENDECTOMY     COLONOSCOPY     COLONOSCOPY WITH PROPOFOL N/A 07/16/2019   Procedure: COLONOSCOPY WITH PROPOFOL;  Surgeon: Lollie Sails, MD;  Location: [facility] ENDOSCOPY;  Service: Endoscopy;  Laterality: N/A;   ESOPHAGOGASTRODUODENOSCOPY (EGD) WITH PROPOFOL N/A 07/16/2019   Procedure: ESOPHAGOGASTRODUODENOSCOPY (EGD) WITH PROPOFOL;  Surgeon: Lollie Sails, MD;  Location: [facility] ENDOSCOPY;  Service: Endoscopy;  Laterality: N/A;   TUBAL LIGATION      There were no vitals filed for this visit.   Subjective Assessment - 10/08/21 0803     Subjective My back hurts today.    Patient Stated Goals To be stronger and be able to walk better.    Currently in Pain? Yes    Pain Score 6     Pain Location Back    Pain Orientation Lower    Pain Descriptors / Indicators Sore    Pain Type Chronic pain                                [facility] Adult PT Treatment/Exercise - 10/08/21 0001       High Level Balance   High Level Balance Comments standing on blue foam performing shoulder horizontal abd with red tband 2x10   pt requires cuing for technique for horizontal abduction     Knee/Hip Exercises: Aerobic   Nustep L5 x6 min with PT present to discuss progress and provide oversight   422 steps     Knee/Hip Exercises: Standing   Heel Raises Both;1 set;10 reps    Heel Raises Limitations 3#    Hip Flexion Stengthening;Both;1 set;10 reps    Hip Flexion Limitations 3#    Hip Abduction Stengthening;Both;1 set;10 reps    Abduction Limitations 3#    Hip Extension Stengthening;Both;1 set;10 reps    Extension Limitations 3#    Functional Squat 1 set;10 reps    Functional Squat Limitations in parallel bars    Gait Training Ambulation down PT hall and back with RW and 3# on each ankle    Other Standing Knee Exercises Standing alt LE toe tap with 3# 2x10   unilateral UE support  Knee/Hip Exercises: Seated   Long Arc Quad Strengthening;Both;2 sets;10 reps    Long Arc Quad Weight 3 lbs.    Marching Strengthening;Both;2 sets;10 reps    Marching Limitations 3#    Abduction/Adduction  Strengthening;Both;2 sets;10 reps    Abd/Adduction Limitations hip abduct scissors with 3#    Sit to Sand 10 reps;with UE support                       PT Short Term Goals - 09/07/21 0834       PT SHORT TERM GOAL #1   Title Pt will be independent with initial HEP.    Status Achieved               PT Long Term Goals - 10/05/21 0848       PT LONG TERM GOAL #1   Title Pt will be independent with advanced HEP.    Status Partially Met      PT LONG TERM GOAL #2   Title Pt will increase BLE strength to at least 4+/5 to allow her to perform functional activities.    Status On-going      PT LONG TERM GOAL #3   Title FOTO score will increase to 50% to demo improved functional use of  B knees.    Status Achieved      PT LONG TERM GOAL #4   Title Pt will be able to return to household and short distance community ambulation with rollator with good stability of Rt UE and min/no pain in Rt shoulder    Status On-going                   Plan - 10/08/21 0849     Clinical Impression Statement Pamela Cox tolerated session well, despite having increased back pain. During session, she states that she was having less back pain as she worked out.  Pt with difficulty with alt LE toe tap with only unilateral support and required CGA.  With standing on blue foam, pt with difficulty performing shoulder horizontal abduction with red theraband and requires cuing for technique. Pt reports that standing hip exercises are getting easier.    PT Treatment/Interventions ADLs/Self Care Home Management;Cryotherapy;Electrical Stimulation;Iontophoresis 55m/ml Dexamethasone;Moist Heat;Ultrasound;Gait training;Stair training;Therapeutic activities;Functional mobility training;Therapeutic exercise;Balance training;Patient/family education;Manual techniques;Passive range of motion;Dry needling;Taping;Vasopneumatic Device;Joint Manipulations    PT Next Visit Plan assess and progress HEP. strengthening, ambulation    Consulted and Agree with Plan of Care Patient             Patient will benefit from skilled therapeutic intervention in order to improve the following deficits and impairments:  Difficulty walking, Increased muscle spasms, Obesity, Increased edema, Decreased activity tolerance, Decreased strength, Pain  Visit Diagnosis: Chronic pain of left knee  Chronic pain of right knee  Muscle weakness (generalized)  Difficulty in walking, not elsewhere classified     Problem List Patient Active Problem List   Diagnosis Date Noted   Allergic rhinitis due to pollen 08/23/2018   DDD (degenerative disc disease), lumbar 08/23/2018   Lumbar neuritis 08/23/2018   Dysthymic disorder  08/23/2018   GERD (gastroesophageal reflux disease) 08/23/2018   Morbid obesity ([place] 08/23/2018   Obstructive sleep apnea 08/23/2018   Intervertebral disc disorder with radiculopathy of lumbar region 08/23/2018   CML (chronic myelocytic leukemia) ([place] 08/23/2018   Hx of myocardial perfusion scan 08/23/2018   Osteoarthritis 08/23/2018   Degenerative arthritis of hip 08/23/2018   Lumbar spondylosis 08/23/2018  SOB (shortness of breath) 08/23/2018   History of CVA (cerebrovascular accident) 08/23/2018   Essential hypertension 08/23/2018   Chronic right shoulder pain 08/23/2018   Complete tear of right rotator cuff 08/23/2018   Acute pain of left shoulder 08/23/2018   On antineoplastic chemotherapy 08/23/2018   Hyperlipidemia 08/23/2018   Neurogenic claudication due to lumbar spinal stenosis 08/23/2018   Vitamin D deficiency 08/23/2018    Juel Burrow, PT, DPT 10/08/2021, 8:55 AM  [place] @ [place], [place], [address] Phone: [phone]   Fax:  [phone]  Name: Pamela Cox MRN: [id number] Date of Birth: 10-01-1941

## 2021-10-12 ENCOUNTER — Encounter: Payer: Self-pay | Admitting: Physical Therapy

## 2021-10-12 ENCOUNTER — Ambulatory Visit: Payer: Medicare HMO | Admitting: Physical Therapy

## 2021-10-12 ENCOUNTER — Other Ambulatory Visit: Payer: Self-pay

## 2021-10-12 DIAGNOSIS — R262 Difficulty in walking, not elsewhere classified: Secondary | ICD-10-CM

## 2021-10-12 DIAGNOSIS — M25562 Pain in left knee: Secondary | ICD-10-CM | POA: Diagnosis not present

## 2021-10-12 DIAGNOSIS — R293 Abnormal posture: Secondary | ICD-10-CM

## 2021-10-12 DIAGNOSIS — G8929 Other chronic pain: Secondary | ICD-10-CM

## 2021-10-12 DIAGNOSIS — M6281 Muscle weakness (generalized): Secondary | ICD-10-CM

## 2021-10-12 NOTE — Therapy (Signed)
[place] @ [address] [place], [place], [address] Phone: [phone]   Fax:  [phone]  Physical Therapy Treatment  Patient Details  Name: Pamela Cox MRN: [id number] Date of Birth: June 13, 1941 Referring Provider (PT): Dr Kizzie Bane   Encounter Date: 10/12/2021   PT End of Session - 10/12/21 0806     Visit Number 12    Date for PT Re-Evaluation 10/23/21    Authorization Type [employer] Medicare    Progress Note Due on Visit 20    PT Start Time 0805    PT Stop Time 0843    PT Time Calculation (min) 38 min    Activity Tolerance Patient tolerated treatment well    Behavior During Therapy [facility] for tasks assessed/performed             Past Medical History:  Diagnosis Date   Arthritis    CML (chronic myelocytic leukemia) ([place])    CVA (cerebral vascular accident) ([place])    Diabetes mellitus without complication ([place])    GERD (gastroesophageal reflux disease)    Hypertension    Myelocytic leukemia, chronic ([place])    Obesity    Sleep apnea     Past Surgical History:  Procedure Laterality Date   APPENDECTOMY     COLONOSCOPY     COLONOSCOPY WITH PROPOFOL N/A 07/16/2019   Procedure: COLONOSCOPY WITH PROPOFOL;  Surgeon: Lollie Sails, MD;  Location: [facility] ENDOSCOPY;  Service: Endoscopy;  Laterality: N/A;   ESOPHAGOGASTRODUODENOSCOPY (EGD) WITH PROPOFOL N/A 07/16/2019   Procedure: ESOPHAGOGASTRODUODENOSCOPY (EGD) WITH PROPOFOL;  Surgeon: Lollie Sails, MD;  Location: [facility] ENDOSCOPY;  Service: Endoscopy;  Laterality: N/A;   TUBAL LIGATION      There were no vitals filed for this visit.   Subjective Assessment - 10/12/21 0807     Subjective Low back is a little sore this AM.    Pertinent History R Total Sh Arthroplasty 09/17/20, CML, HTN, GERD, CVA, spinal stenosis, neurogenic claudication    Currently in Pain? Yes    Pain Score 4     Pain Location Back    Pain Orientation Lower    Aggravating Factors   Not sure    Pain Relieving Factors Heat, rets, sitting down                               [facility] Adult PT Treatment/Exercise - 10/12/21 0001       Knee/Hip Exercises: Aerobic   Nustep L3 x 10 min with PTA present to discuss status      Knee/Hip Exercises: Standing   Abduction Limitations red band at ankles 2x10    Gait Training Ambulation down PT hall and back with RW and 3# on each ankle      Knee/Hip Exercises: Seated   Long Arc Quad Strengthening;Both;2 sets;10 reps    Long Arc Quad Weight 3 lbs.   red band 2x10 gave for HEP   Ball Squeeze 5 sec hold 2x10    Sit to Sand --   2x5: started with some UE then no UE                      PT Short Term Goals - 09/07/21 0834       PT SHORT TERM GOAL #1   Title Pt will be independent with initial HEP.    Status Achieved  PT Long Term Goals - 10/05/21 0848       PT LONG TERM GOAL #1   Title Pt will be independent with advanced HEP.    Status Partially Met      PT LONG TERM GOAL #2   Title Pt will increase BLE strength to at least 4+/5 to allow her to perform functional activities.    Status On-going      PT LONG TERM GOAL #3   Title FOTO score will increase to 50% to demo improved functional use of B knees.    Status Achieved      PT LONG TERM GOAL #4   Title Pt will be able to return to household and short distance community ambulation with rollator with good stability of Rt UE and min/no pain in Rt shoulder    Status On-going                   Plan - 10/12/21 2423     Clinical Impression Statement Pt arrives with mild low back pain this AM. This did not interfere with exercises. PTA had pt don & doof ankle weights to see if she could do this independently on her own at home. Pt could not tighten the ankle weights by herself but she could get a red band on independently. PT Agave her red band to take home to add resistance to her leg extensions at home.     Personal Factors and Comorbidities Age    Examination-Activity Limitations Stairs;Continence    Examination-Participation Restrictions Community Activity    Stability/Clinical Decision Making Stable/Uncomplicated    Rehab Potential Good    PT Frequency 2x / week    PT Duration 8 weeks    PT Treatment/Interventions ADLs/Self Care Home Management;Cryotherapy;Electrical Stimulation;Iontophoresis 4mg /ml Dexamethasone;Moist Heat;Ultrasound;Gait training;Stair training;Therapeutic activities;Functional mobility training;Therapeutic exercise;Balance training;Patient/family education;Manual techniques;Passive range of motion;Dry needling;Taping;Vasopneumatic Device;Joint Manipulations    PT Next Visit Plan assess and progress HEP. strengthening, ambulation    PT Home Exercise Plan Access Code: [email or web address]    Consulted and Agree with Plan of Care Patient             Patient will benefit from skilled therapeutic intervention in order to improve the following deficits and impairments:  Difficulty walking, Increased muscle spasms, Obesity, Increased edema, Decreased activity tolerance, Decreased strength, Pain  Visit Diagnosis: Chronic pain of left knee  Chronic pain of right knee  Muscle weakness (generalized)  Difficulty in walking, not elsewhere classified  Abnormal posture     Problem List Patient Active Problem List   Diagnosis Date Noted   Allergic rhinitis due to pollen 08/23/2018   DDD (degenerative disc disease), lumbar 08/23/2018   Lumbar neuritis 08/23/2018   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   GERD (gastroesophageal reflux disease) 08/23/2018   Morbid obesity ([place]) 08/23/2018   Obstructive sleep apnea 08/23/2018   Intervertebral disc disorder with radiculopathy of lumbar region 08/23/2018   CML (chronic myelocytic leukemia) ([place]) 08/23/2018   Hx of myocardial perfusion scan 08/23/2018   Osteoarthritis 08/23/2018   Degenerative arthritis of hip 08/23/2018   Lumbar spondylosis  08/23/2018   SOB (shortness of breath) 08/23/2018   History of CVA (cerebrovascular accident) 08/23/2018   Essential hypertension 08/23/2018   Chronic right shoulder pain 08/23/2018   Complete tear of right rotator cuff 08/23/2018   Acute pain of left shoulder 08/23/2018   On antineoplastic chemotherapy 08/23/2018   Hyperlipidemia 08/23/2018   Neurogenic claudication due to lumbar spinal stenosis 08/23/2018   Vitamin D  deficiency 08/23/2018    Kayci [place], PTA 10/12/2021, 8:44 AM  [place] @ [address] [place], [place], [address] Phone: [phone]   Fax:  [phone]  Name: Pamela Cox MRN: [id number] Date of Birth: 21-Nov-1941

## 2021-10-19 ENCOUNTER — Encounter: Payer: Self-pay | Admitting: Rehabilitative and Restorative Service Providers"

## 2021-10-19 ENCOUNTER — Other Ambulatory Visit: Payer: Self-pay

## 2021-10-19 ENCOUNTER — Ambulatory Visit: Payer: Medicare HMO | Admitting: Rehabilitative and Restorative Service Providers"

## 2021-10-19 DIAGNOSIS — M25562 Pain in left knee: Secondary | ICD-10-CM

## 2021-10-19 DIAGNOSIS — M5442 Lumbago with sciatica, left side: Secondary | ICD-10-CM

## 2021-10-19 DIAGNOSIS — M25561 Pain in right knee: Secondary | ICD-10-CM

## 2021-10-19 DIAGNOSIS — M6281 Muscle weakness (generalized): Secondary | ICD-10-CM

## 2021-10-19 DIAGNOSIS — R262 Difficulty in walking, not elsewhere classified: Secondary | ICD-10-CM

## 2021-10-19 NOTE — Therapy (Signed)
[place] @ [address] [place], [place], [address] Phone: [phone]   Fax:  [phone]  Physical Therapy Treatment  Patient Details  Name: Pamela Cox MRN: [id number] Date of Birth: 06-12-1941 Referring Provider (PT): Dr Kizzie Bane   Encounter Date: 10/19/2021   PT End of Session - 10/19/21 0809     Visit Number 13    Date for PT Re-Evaluation 10/23/21    Authorization Type A[employer]na Medicare    Progress Note Due on Visit 81    PT Start Time 0800    PT Stop Time 0840    PT Time Calculation (min) 40 min    Activity Tolerance Patient tolerated treatment well    Behavior During Therapy [facility] for tasks assessed/performed             Past Medical History:  Diagnosis Date   Arthritis    CML (chronic myelocytic leukemia) ([place])    CVA (cerebral vascular accident) ([place])    Diabetes mellitus without complication ([place])    GERD (gastroesophageal reflux disease)    Hypertension    Myelocytic leukemia, chronic ([place])    Obesity    Sleep apnea     Past Surgical History:  Procedure Laterality Date   APPENDECTOMY     COLONOSCOPY     COLONOSCOPY WITH PROPOFOL N/A 07/16/2019   Procedure: COLONOSCOPY WITH PROPOFOL;  Surgeon: Lollie Sails, MD;  Location: [facility] ENDOSCOPY;  Service: Endoscopy;  Laterality: N/A;   ESOPHAGOGASTRODUODENOSCOPY (EGD) WITH PROPOFOL N/A 07/16/2019   Procedure: ESOPHAGOGASTRODUODENOSCOPY (EGD) WITH PROPOFOL;  Surgeon: Lollie Sails, MD;  Location: [facility] ENDOSCOPY;  Service: Endoscopy;  Laterality: N/A;   TUBAL LIGATION      There were no vitals filed for this visit.   Subjective Assessment - 10/19/21 0807     Subjective I didn't think that I was going to be able to come in this morning.  Pt reports that she is having some increased pain.    Patient Stated Goals To be stronger and be able to walk better.    Currently in Pain? Yes    Pain Score 6     Pain Location Back    Pain  Orientation Lower                               [facility] Adult PT Treatment/Exercise - 10/19/21 0001       Transfers   Five time sit to stand comments  15.9 sec with unilateral UE use      Knee/Hip Exercises: Aerobic   Nustep L3 x 10 min with PT present to discuss status      Knee/Hip Exercises: Standing   Hip Flexion Stengthening;Both;1 set;10 reps;Knee straight    Hip Flexion Limitations yellow tband    Hip Abduction Stengthening;Both;1 set;10 reps    Abduction Limitations yellow tband    Hip Extension Stengthening;Both;1 set;10 reps;Knee straight    Extension Limitations yellow tband      Knee/Hip Exercises: Seated   Long Arc Quad Strengthening;Both;2 sets;10 reps    Long Arc Quad Weight 3 lbs.    Abduction/Adduction  Strengthening;Both;2 sets;10 reps    Abd/Adduction Limitations hip abduct scissors with 3#                       PT Short Term Goals - 09/07/21 0834       PT SHORT TERM GOAL #  1   Title Pt will be independent with initial HEP.    Status Achieved               PT Long Term Goals - 10/19/21 0844       PT LONG TERM GOAL #1   Title Pt will be independent with advanced HEP.    Status Achieved      PT LONG TERM GOAL #2   Title Pt will increase BLE strength to at least 4+/5 to allow her to perform functional activities.    Status Partially Met      PT LONG TERM GOAL #3   Title FOTO score will increase to 50% to demo improved functional use of B knees.    Status Achieved      PT LONG TERM GOAL #4   Title Pt will be able to return to household and short distance community ambulation with rollator with good stability of Rt UE and min/no pain in Rt shoulder    Status Partially Met                   Plan - 10/19/21 0836     Clinical Impression Statement Pt continues to progress towards goal related activities and will be ready for discharge next visit, as scheduled. PT provided pt with a yellow theraband for  standing hip exercises and updated HEP. Pt continues to be compliant with HEP and continues to improve her 5 times sit to/from stand assessment.    PT Treatment/Interventions ADLs/Self Care Home Management;Cryotherapy;Electrical Stimulation;Iontophoresis 62m/ml Dexamethasone;Moist Heat;Ultrasound;Gait training;Stair training;Therapeutic activities;Functional mobility training;Therapeutic exercise;Balance training;Patient/family education;Manual techniques;Passive range of motion;Dry needling;Taping;Vasopneumatic Device;Joint Manipulations    PT Next Visit Plan Reassess and discharge if goals met.    PT Home Exercise Plan Access Code: [email or web address]   Consulted and Agree with Plan of Care Patient             Patient will benefit from skilled therapeutic intervention in order to improve the following deficits and impairments:  Difficulty walking, Increased muscle spasms, Obesity, Increased edema, Decreased activity tolerance, Decreased strength, Pain  Visit Diagnosis: Chronic pain of left knee  Chronic pain of right knee  Muscle weakness (generalized)  Difficulty in walking, not elsewhere classified  Chronic bilateral low back pain with bilateral sciatica     Problem List Patient Active Problem List   Diagnosis Date Noted   Allergic rhinitis due to pollen 08/23/2018   DDD (degenerative disc disease), lumbar 08/23/2018   Lumbar neuritis 08/23/2018   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   GERD (gastroesophageal reflux disease) 08/23/2018   Morbid obesity ([place] 08/23/2018   Obstructive sleep apnea 08/23/2018   Intervertebral disc disorder with radiculopathy of lumbar region 08/23/2018   CML (chronic myelocytic leukemia) ([place] 08/23/2018   Hx of myocardial perfusion scan 08/23/2018   Osteoarthritis 08/23/2018   Degenerative arthritis of hip 08/23/2018   Lumbar spondylosis 08/23/2018   SOB (shortness of breath) 08/23/2018   History of CVA (cerebrovascular accident) 08/23/2018   Essential  hypertension 08/23/2018   Chronic right shoulder pain 08/23/2018   Complete tear of right rotator cuff 08/23/2018   Acute pain of left shoulder 08/23/2018   On antineoplastic chemotherapy 08/23/2018   Hyperlipidemia 08/23/2018   Neurogenic claudication due to lumbar spinal stenosis 08/23/2018   Vitamin D deficiency 08/23/2018    SJuel Burrow PT, DPT 10/19/2021, 8:45 AM  [place] [place] [address]Phone: [phone]  Fax:  [phone]  Name: Pamela Cox MRN: [id number] Date of Birth: 1941/11/08

## 2021-10-22 ENCOUNTER — Encounter: Payer: Self-pay | Admitting: Rehabilitative and Restorative Service Providers"

## 2021-10-22 ENCOUNTER — Other Ambulatory Visit: Payer: Self-pay

## 2021-10-22 ENCOUNTER — Ambulatory Visit: Payer: Medicare HMO | Attending: Orthopedic Surgery | Admitting: Rehabilitative and Restorative Service Providers"

## 2021-10-22 DIAGNOSIS — M25562 Pain in left knee: Secondary | ICD-10-CM | POA: Insufficient documentation

## 2021-10-22 DIAGNOSIS — M25561 Pain in right knee: Secondary | ICD-10-CM | POA: Diagnosis present

## 2021-10-22 DIAGNOSIS — M6281 Muscle weakness (generalized): Secondary | ICD-10-CM | POA: Diagnosis present

## 2021-10-22 DIAGNOSIS — R262 Difficulty in walking, not elsewhere classified: Secondary | ICD-10-CM

## 2021-10-22 DIAGNOSIS — G8929 Other chronic pain: Secondary | ICD-10-CM

## 2021-10-22 NOTE — Therapy (Signed)
[place] @ [address] [place], [place], [address] Phone: [phone]   Fax:  [phone]  Physical Therapy Treatment and Discharge Summary  Patient Details  Name: Pamela Cox MRN: [id number] Date of Birth: [birth year] Referring Provider (PT): Dr Kizzie Bane   Encounter Date: 10/22/2021   PT End of Session - 10/22/21 1236     Visit Number 14    Date for PT Re-Evaluation 10/23/21    Authorization Type Aetna Medicare    Progress Note Due on Visit 55    PT Start Time 1230    PT Stop Time 1310    PT Time Calculation (min) 40 min    Activity Tolerance Patient tolerated treatment well    Behavior During Therapy [facility] for tasks assessed/performed             Past Medical History:  Diagnosis Date   Arthritis    CML (chronic myelocytic leukemia) ([place])    CVA (cerebral vascular accident) ([place])    Diabetes mellitus without complication ([place])    GERD (gastroesophageal reflux disease)    Hypertension    Myelocytic leukemia, chronic ([place])    Obesity    Sleep apnea     Past Surgical History:  Procedure Laterality Date   APPENDECTOMY     COLONOSCOPY     COLONOSCOPY WITH PROPOFOL N/A 07/16/2019   Procedure: COLONOSCOPY WITH PROPOFOL;  Surgeon: Lollie Sails, MD;  Location[facility] ENDOSCOPY;  Service: Endoscopy;  Laterality: N/A;   ESOPHAGOGASTRODUODENOSCOPY (EGD) WITH PROPOFOL N/A 07/16/2019   Procedure: ESOPHAGOGASTRODUODENOSCOPY (EGD) WITH PROPOFOL;  Surgeon: Lollie Sails, MD;  Location: [facility] ENDOSCOPY;  Service: Endoscopy;  Laterality: N/A;   TUBAL LIGATION      There were no vitals filed for this visit.   Subjective Assessment - 10/22/21 1235     Subjective Pt reports that her back is sore    Pertinent History R Total Sh Arthroplasty 09/17/20, CML, HTN, GERD, CVA, spinal stenosis, neurogenic claudication    Patient Stated Goals To be stronger and be able to walk better.    Currently in Pain? Yes     Pain Score 5     Pain Location Back    Pain Orientation Lower    Pain Descriptors / Indicators Sore                [facility] PT Assessment - 10/22/21 0001       Assessment   Medical Diagnosis OA B knees    Referring Provider (PT) Dr Kizzie Bane      Prior Function   Level of Independence Independent with household mobility with device    Vocation Retired    Leisure housework, playing with grandson      Strength   Overall Strength Comments BLE strength of at least 4+/5                           [facility] Adult PT Treatment/Exercise - 10/22/21 0001       Knee/Hip Exercises: Aerobic   Nustep L5 x6 min with PT present to discuss progress.   449 steps     Knee/Hip Exercises: Standing   Gait Training 4 minute walk test: 484 ft with RW      Knee/Hip Exercises: Seated   Long Arc Quad Strengthening;Both;2 sets;10 reps   cuing to slow down   [employer] Weight 3 lbs.    Marching Strengthening;Both;2 sets;10 reps  cuing to slow down   Marching Limitations 3#                       PT Short Term Goals - 09/07/21 0834       PT SHORT TERM GOAL #1   Title Pt will be independent with initial HEP.    Status Achieved               PT Long Term Goals - 10/22/21 1307       PT LONG TERM GOAL #1   Title Pt will be independent with advanced HEP.    Status Achieved      PT LONG TERM GOAL #2   Title Pt will increase BLE strength to at least 4+/5 to allow her to perform functional activities.    Status Achieved      PT LONG TERM GOAL #3   Title FOTO score will increase to 50% to demo improved functional use of B knees.    Status Achieved      PT LONG TERM GOAL #4   Title Pt will be able to return to household and short distance community ambulation with rollator with good stability of Rt UE and min/no pain in Rt shoulder    Status Achieved                   Plan - 10/22/21 1303     Clinical Impression Statement Pt has made  great progress with goal related activities and has met all goals.  Pt has HEP and additionally emailed exercises to pt to allow daughter to pull them up for videos, as well. Pt is compliant with HEP.  Pt reports since starting PT she feels that her knees are 80% better than when she started. Pt has improved on her strength and is able to ambulate incrased distances. Pt is ready for discharge from outpatient PT today to continue with HEP.    PT Treatment/Interventions ADLs/Self Care Home Management;Cryotherapy;Electrical Stimulation;Iontophoresis 4mg /ml Dexamethasone;Moist Heat;Ultrasound;Gait training;Stair training;Therapeutic activities;Functional mobility training;Therapeutic exercise;Balance training;Patient/family education;Manual techniques;Passive range of motion;Dry needling;Taping;Vasopneumatic Device;Joint Manipulations    PT Next Visit Plan outpatient PT discharged    PT Home Exercise Plan Access Code: [email or web address]    Consulted and Agree with Plan of Care Patient             Patient will benefit from skilled therapeutic intervention in order to improve the following deficits and impairments:  Difficulty walking, Increased muscle spasms, Obesity, Increased edema, Decreased activity tolerance, Decreased strength, Pain  Visit Diagnosis: Chronic pain of left knee  Chronic pain of right knee  Muscle weakness (generalized)  Difficulty in walking, not elsewhere classified     Problem List Patient Active Problem List   Diagnosis Date Noted   Allergic rhinitis due to pollen 08/23/2018   DDD (degenerative disc disease), lumbar 08/23/2018   Lumbar neuritis 08/23/2018   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   GERD (gastroesophageal reflux disease) 08/23/2018   Morbid obesity ([place]) 08/23/2018   Obstructive sleep apnea 08/23/2018   Intervertebral disc disorder with radiculopathy of lumbar region 08/23/2018   CML (chronic myelocytic leukemia) ([place]) 08/23/2018   Hx of myocardial perfusion scan  08/23/2018   Osteoarthritis 08/23/2018   Degenerative arthritis of hip 08/23/2018   Lumbar spondylosis 08/23/2018   SOB (shortness of breath) 08/23/2018   History of CVA (cerebrovascular accident) 08/23/2018   Essential hypertension 08/23/2018   Chronic right shoulder pain 08/23/2018   Complete tear of right  rotator cuff 08/23/2018   Acute pain of left shoulder 08/23/2018   On antineoplastic chemotherapy 08/23/2018   Hyperlipidemia 08/23/2018   Neurogenic claudication due to lumbar spinal stenosis 08/23/2018   Vitamin D deficiency 08/23/2018   PHYSICAL THERAPY DISCHARGE SUMMARY   Patient agrees to discharge. Patient goals were met. Patient is being discharged due to meeting the stated rehab goals.   Juel Burrow, PT 10/22/2021, 1:26 PM  [place] @ [place], [place], [address] Phone: [phone]   Fax:  [phone]  Name: Pamela Cox MRN: [id number] Date of Birth: 12-24-1940

## 2022-04-02 NOTE — Progress Notes (Signed)
GYNECOLOGY  VISIT ?  ?HPI: ?81 y.o.   Widowed  [place] American  female   ?No obstetric history on file. with No LMP recorded. Patient is postmenopausal.   ?here for bump on side of vulva that can get irritated and bothersome. At times can get some fluid out that is clear.   ? ?Her daughter is present for the visit today.  ? ?Has had some lumps for "a while."  ?They can flare up.  ?When she mashes them, they produce some liquid.  ?A little sore, but not really painful. ?No fevers.  ? ?She had some of the lumps lanced in the past.  ? ?No hx of abnormal paps.  ? ?Denies vaginal bleeding.  ? ?Had a tubal ligation.  ? ?GYNECOLOGIC HISTORY: ?No LMP recorded. Patient is postmenopausal. ?Contraception: PM ?Menopausal hormone therapy: none ?Last mammogram: 06/20/2019- neg birads 1 ?Last pap smear: unsure ?Not sexually active.  Husband passed 12 years ago.  ?       ?OB History   ? ? Gravida  ?5  ? Para  ?3  ? Term  ?3  ? Preterm  ?   ? AB  ?2  ? Living  ?3  ?  ? ? SAB  ?   ? IAB  ?   ? Ectopic  ?   ? Multiple  ?   ? Live Births  ?   ?   ?  ?  ?    ? ?Patient Active Problem List  ? Diagnosis Date Noted  ? Allergic rhinitis due to pollen 08/23/2018  ? DDD (degenerative disc disease), lumbar 08/23/2018  ? Lumbar neuritis 08/23/2018  ? 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  ? GERD (gastroesophageal reflux disease) 08/23/2018  ? Morbid obesity ([place]) 08/23/2018  ? Obstructive sleep apnea 08/23/2018  ? Intervertebral disc disorder with radiculopathy of lumbar region 08/23/2018  ? 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 Hx of myocardial perfusion scan 08/23/2018  ? Osteoarthritis 08/23/2018  ? Degenerative arthritis of hip 08/23/2018  ? Lumbar spondylosis 08/23/2018  ? SOB (shortness of breath) 08/23/2018  ? History of CVA (cerebrovascular accident) 08/23/2018  ? Essential hypertension 08/23/2018  ? Chronic right shoulder pain 08/23/2018  ? Complete tear of right rotator cuff 08/23/2018  ? Acute pain of left shoulder 08/23/2018   ? On antineoplastic chemotherapy 08/23/2018  ? Hyperlipidemia 08/23/2018  ? Neurogenic claudication due to lumbar spinal stenosis 08/23/2018  ? Vitamin D deficiency 08/23/2018  ? ? ?Past Medical History:  ?Diagnosis Date  ? Arthritis   ? CML (chronic myelocytic leukemia) ([place])   ? CVA (cerebral vascular accident) [facility])   ? Diabetes mellitus without complication ([place])   ? GERD (gastroesophageal reflux disease)   ? Hypertension   ? Myelocytic leukemia, chronic ([place])   ? Obesity   ? Sleep apnea   ? ? ?Past Surgical History:  ?Procedure Laterality Date  ? APPENDECTOMY    ? COLONOSCOPY    ? COLONOSCOPY WITH PROPOFOL N/A 07/16/2019  ? Procedure: COLONOSCOPY WITH PROPOFOL;  Surgeon: Lollie Sails, MD;  Location: [facility] ENDOSCOPY;  Service: Endoscopy;  Laterality: N/A;  ? ESOPHAGOGASTRODUODENOSCOPY (EGD) WITH PROPOFOL N/A 07/16/2019  ? Procedure: ESOPHAGOGASTRODUODENOSCOPY (EGD) WITH PROPOFOL;  Surgeon: Lollie Sails, MD;  Location: [facility] ENDOSCOPY;  Service: Endoscopy;  Laterality: N/A;  ? TOTAL SHOULDER REPLACEMENT Right 2021  ? TUBAL LIGATION    ? ? ?Current Outpatient Medications  ?Medication Sig Dispense Refill  ? acetaminophen (TYLENOL) 500 MG tablet Take 500 mg  by mouth every 6 (six) hours as needed for mild pain.    ? amLODipine (NORVASC) 10 MG tablet Take 10 mg by mouth daily.    ? atorvastatin (LIPITOR) 80 MG tablet Take 80 mg by mouth daily.    ? Calcium Carb-Cholecalciferol 600-10 MG-MCG TABS Take 1 tablet by mouth daily.    ? chlorthalidone (HYGROTON) 25 MG tablet chlorthalidone 25 mg tablet    ? Cholecalciferol 25 MCG (1000 UT) capsule Take by mouth.    ? cyanocobalamin 1000 MCG tablet Take by mouth.    ? dasatinib (SPRYCEL) 80 MG tablet Take 80 mg by mouth daily.    ? diclofenac (CATAFLAM) 50 MG tablet diclofenac potassium 50 mg tablet    ? diclofenac (VOLTAREN) 50 MG EC tablet Take 1 tablet (50 mg total) by mouth 2 (two) times daily. (Patient taking differently: Take 50 mg by mouth 2 (two) times  daily as needed for mild pain.) 20 tablet 0  ? diclofenac Sodium (VOLTAREN) 1 % GEL diclofenac 1 % topical gel    ? dipyridamole-aspirin (AGGRENOX) 200-25 MG 12hr capsule Take 1 capsule by mouth daily.    ? doxycycline (VIBRAMYCIN) 50 MG capsule doxycycline hyclate 50 mg capsule    ? Fexofenadine HCl (ALLEGRA PO) Take 180 mg by mouth as needed (allergies).    ? fluticasone (FLONASE) 50 MCG/ACT nasal spray Place 2 sprays into both nostrils as needed for allergies or rhinitis.    ? gabapentin (NEURONTIN) 300 MG capsule Take 300 mg by mouth 2 (two) times daily.    ? labetalol (NORMODYNE) 300 MG tablet Take 300 mg by mouth 2 (two) times daily.    ? latanoprost (XALATAN) 0.005 % ophthalmic solution Apply to eye.    ? losartan (COZAAR) 100 MG tablet Take 100 mg by mouth daily.    ? magnesium oxide (MAG-OX) 400 MG tablet Take 400 mg by mouth daily.    ? meclizine (ANTIVERT) 12.5 MG tablet Take by mouth.    ? mometasone (ELOCON) 0.1 % lotion APPLY 2 TO 4 DROPS TOPICALLY INTO EACH EAR EVERY DAY AT BEDTIME AS NEEDED FOR DRY SKIN/IRRITATION    ? omeprazole (PRILOSEC) 40 MG capsule Take 40 mg by mouth daily.    ? ondansetron (ZOFRAN) 4 MG tablet ondansetron HCl 4 mg tablet    ? polyethylene glycol powder (GLYCOLAX/MIRALAX) 17 GM/SCOOP powder Take by mouth.    ? spironolactone (ALDACTONE) 25 MG tablet Take 25 mg by mouth daily.    ? ?No current facility-administered medications for this visit.  ?  ? ?ALLERGIES: Patient has no known allergies. ? ?Family History  ?Problem Relation Age of Onset  ? Hypertension Mother   ? Diabetes Mother   ? Pancreatic cancer Sister   ? Breast cancer Neg Hx   ? ? ?Social History  ? ?Socioeconomic History  ? Marital status: Widowed  ?  Spouse name: Not on file  ? Number of children: Not on file  ? Years of education: Not on file  ? Highest education level: Not on file  ?Occupational History  ? Not on file  ?Tobacco Use  ? Smoking status: Never  ? Smokeless tobacco: Never  ?Vaping Use  ? Vaping Use:  Never used  ?Substance and Sexual Activity  ? Alcohol use: No  ? Drug use: Never  ? Sexual activity: Not Currently  ?  Birth control/protection: Post-menopausal  ?Other Topics Concern  ? Not on file  ?Social History Narrative  ? Not on file  ? ?  Social Determinants of Health  ? ?Financial Resource Strain: Not on file  ?Food Insecurity: Not on file  ?Transportation Needs: Not on file  ?Physical Activity: Not on file  ?Stress: Not on file  ?Social Connections: Not on file  ?Intimate Partner Violence: Not on file  ? ? ?Review of Systems  See HPI.  ? ?PHYSICAL EXAMINATION:   ? ?BP 118/68   Pulse 63   SpO2 94%     ?General appearance: alert, cooperative and appears stated age ?Head: Normocephalic, without obvious abnormality, atraumatic ?Neck: no adenopathy, supple, symmetrical, trachea midline and thyroid normal to inspection and palpation ?Lungs: clear to auscultation bilaterally ?Heart: regular rate and rhythm ?Abdomen: large pannus.  Abdomen is soft, non-tender, no masses,  no organomegaly ?Skin: Skin color, texture, turgor normal. No rashes or lesions ?No abnormal inguinal nodes palpated ? ?Pelvic: External genitalia:  2 sebaceous cysts, right labia majora with slightly soft 4 mm sebaceous cyst and left labia majora with 4 mm firm sebaceous cyst. ?             Urethra:  normal appearing urethra with no masses, tenderness or lesions ?             Bartholins and Skenes: normal    ?             Vagina: normal appearing vagina with normal color and discharge, no lesions ?             Cervix: no lesions ?               ?Bimanual Exam:  Uterus:  normal size, contour, position, consistency, mobility, non-tender ?             Adnexa: no mass, fullness, tenderness ?             Rectal exam: yes.  Confirms. ?             Anus:  normal sphincter tone, no lesions ? ?Chaperone was present for exam:  Santiago Glad, CMA ? ?ASSESSMENT ? ?Sebaceous cysts of the vulva.  ?Nothing really drainable.  ? ?PLAN ? ?Reassurance and education given  regarding sebaceous cysts.  ?I recommend a warm compresses as needed.  ?No abx indicated.  ?Avoid trying to drain them on her own.  ?Excision of the entire cyst and sac would be needed for definitive treatm

## 2022-04-05 ENCOUNTER — Encounter: Payer: Self-pay | Admitting: Obstetrics and Gynecology

## 2022-04-05 ENCOUNTER — Ambulatory Visit (INDEPENDENT_AMBULATORY_CARE_PROVIDER_SITE_OTHER): Payer: Medicare HMO | Admitting: Obstetrics and Gynecology

## 2022-04-05 VITALS — BP 118/68 | HR 63

## 2022-04-05 DIAGNOSIS — L723 Sebaceous cyst: Secondary | ICD-10-CM

## 2022-04-05 NOTE — Patient Instructions (Signed)
Epidermoid Cyst  An epidermoid cyst, also called an epidermal cyst, is a small lump under your skin. The cyst contains a substance called keratin. Do not try to pop or open the cyst yourself. What are the causes? A blocked hair follicle. A hair that curls and re-enters the skin instead of growing straight out of the skin. A blocked pore. Irritated skin. An injury to the skin. Certain conditions that are passed along from parent to child. Human papillomavirus (HPV). This happens rarely when cysts occur on the bottom of the feet. Long-term sun damage to the skin. What increases the risk? Having acne. Being female. Having an injury to the skin. Being past puberty. Having certain conditions caused by genes (genetic disorder) What are the signs or symptoms? These cysts are usually harmless, but they can get infected. Symptoms of infection may include: Redness. Inflammation. Tenderness. Warmth. Fever. A bad-smelling substance that drains from the cyst. Pus that drains from the cyst. How is this treated? In many cases, epidermoid cysts go away on their own without treatment. If a cyst becomes infected, treatment may include: Opening and draining the cyst, done by a doctor. After draining, you may need minor surgery to remove the rest of the cyst. Antibiotic medicine. Shots of medicines (steroids) that help to reduce inflammation. Surgery to remove the cyst. Surgery may be done if the cyst: Becomes large. Bothers you. Has a chance of turning into cancer. Do not try to open a cyst yourself. Follow these instructions at home: Medicines Take over-the-counter and prescription medicines as told by your doctor. If you were prescribed an antibiotic medicine, take it as told by your doctor. Do not stop taking it even if you start to feel better. General instructions Keep the area around your cyst clean and dry. Wear loose, dry clothing. Avoid touching your cyst. Check your cyst every day  for signs of infection. Check for: Redness, swelling, or pain. Fluid or blood. Warmth. Pus or a bad smell. Keep all follow-up visits. How is this prevented? Wear clean, dry, clothing. Avoid wearing tight clothing. Keep your skin clean and dry. Take showers or baths every day. Contact a doctor if: Your cyst has symptoms of infection. Your condition does not improve or gets worse. You have a cyst that looks different from other cysts you have had. You have a fever. Get help right away if: Redness spreads from the cyst into the area close by. Summary An epidermoid cyst is a small lump under your skin. If a cyst becomes infected, treatment may include surgery to open and drain the cyst, or to remove it. Take over-the-counter and prescription medicines only as told by your doctor. Contact a doctor if your condition is not improving or is getting worse. Keep all follow-up visits. This information is not intended to replace advice given to you by your health care provider. Make sure you discuss any questions you have with your health care provider. Document Revised: 02/13/2020 Document Reviewed: 02/13/2020 Elsevier Patient Education  2023 Elsevier Inc.  

## 2022-04-22 NOTE — Therapy (Signed)
OUTPATIENT SPEECH LANGUAGE PATHOLOGY EVALUATION   Patient Name: Pamela Cox MRN: [id number] DOB:[birth year], 81 y.o., female Today's Date: 04/22/2022  PCP: Elza Rafter, MD  REFERRING PROVIDER: Kalman Drape, DO    Documentation being sent to PCP:   Elza Rafter, [address] [place]   [place], [place] [address]   [phone] (Work)   [phone] (Fax)      Past Medical History:  Diagnosis Date   Arthritis    CML (chronic myelocytic leukemia) ([place])    CVA (cerebral vascular accident) ([place])    Diabetes mellitus without complication ([place])    GERD (gastroesophageal reflux disease)    Hypertension    Myelocytic leukemia, chronic ([place])    Obesity    Sleep apnea    Past Surgical History:  Procedure Laterality Date   APPENDECTOMY     COLONOSCOPY     COLONOSCOPY WITH PROPOFOL N/A 07/16/2019   Procedure: COLONOSCOPY WITH PROPOFOL;  Surgeon: Lollie Sails, MD;  Location[facility] ENDOSCOPY;  Service: Endoscopy;  Laterality: N/A;   ESOPHAGOGASTRODUODENOSCOPY (EGD) WITH PROPOFOL N/A 07/16/2019   Procedure: ESOPHAGOGASTRODUODENOSCOPY (EGD) WITH PROPOFOL;  Surgeon: Lollie Sails, MD;  Location: [facility] ENDOSCOPY;  Service: Endoscopy;  Laterality: N/A;   TOTAL SHOULDER REPLACEMENT Right 2021   TUBAL LIGATION     Patient Active Problem List   Diagnosis Date Noted   Allergic rhinitis due to pollen 08/23/2018   DDD (degenerative disc disease), lumbar 08/23/2018   Lumbar neuritis 08/23/2018   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   GERD (gastroesophageal reflux disease) 08/23/2018   Morbid obesity ([place]) 08/23/2018   Obstructive sleep apnea 08/23/2018   Intervertebral disc disorder with radiculopathy of lumbar region 08/23/2018   CML (chronic myelocytic leukemia) ([place]) 08/23/2018   Hx of myocardial perfusion scan 08/23/2018   Osteoarthritis 08/23/2018   Degenerative arthritis of hip 08/23/2018   Lumbar spondylosis 08/23/2018   SOB (shortness of breath) 08/23/2018    History of CVA (cerebrovascular accident) 08/23/2018   Essential hypertension 08/23/2018   Chronic right shoulder pain 08/23/2018   Complete tear of right rotator cuff 08/23/2018   Acute pain of left shoulder 08/23/2018   On antineoplastic chemotherapy 08/23/2018   Hyperlipidemia 08/23/2018   Neurogenic claudication due to lumbar spinal stenosis 08/23/2018   Vitamin D deficiency 08/23/2018    ONSET DATE: 04-13-22   REFERRING DIAG: Lacunar infarct, acute   THERAPY DIAG:  No diagnosis found.  Rationale for Evaluation and Treatment Rehabilitation  SUBJECTIVE:   SUBJECTIVE STATEMENT: "I know something was happening but I didn't think it was a stroke." Pt accompanied by: self  PERTINENT HISTORY:  prior R MCA infarct (no reported residual deficits), peripheral arterial disease, GERD, and HTN who presented for evaluation of dysarthria and L sided facial droop and was found to have a right lacunar stroke. Per consult note by Dr. Tawanna Solo, "The patient presents to the ED with her daughter. They state that the patient had dysarthria upon waking up this morning. Her daughter spoke to her at around 7:30 am before leaving for work and noticed that her voice was slurred. The patient thought it was because she was just waking up and was still groggy. Later in the day, her daughter saw the patient eating food and noticed that her left face was drooping. The patient also reported mild sensory change around the left corner of her mouth.   PAIN:  Are you having pain? Yes: NPRS scale: 7/10 Pain location: lower back Pain description: sore Aggravating factors: sitting and standing  Relieving factors: meds   FALLS: Has patient fallen in last 6 months?  No  LIVING ENVIRONMENT: Lives with: lives with their daughter Lives in: House/apartment  PLOF:  Level of assistance: Independent with ADLs but gets help with some household tasks (daughter writes their shopping list, etc) Employment: Retired - pt  worked as a daytime adult caregiver.   PATIENT GOALS  "Slowing down more and speaking more plainly"  OBJECTIVE:   DIAGNOSTIC FINDINGS:  MR without contrast (04/13/22) Acute, small lacunar infarct in posterior limb of the right internal capsule. Sequelae of remote left parietal lobe infarct.  CT Brain without contrast (04/13/22) Unchanged background of chronic microvascular ischemic white matter changes and scattered remote small vessel infarcts.  COGNITION: Overall cognitive status: Within functional limits for tasks assessed  COGNITIVE COMMUNICATION Following directions: Follows multi-step commands inconsistently (SLP asked pt to write name, address, and date of birth and she wrote name and address). Pt may need cognitive/memory assessment at later date. Auditory comprehension: WFL - SLP and pt held simple-mod complex conversation during evaluation today and it appeared pt understood SLP 100% Verbal expression: WFL Functional communication: WFL  ORAL MOTOR EXAMINATION Facial : Symmetry impaired: Impaired left Lingual: Symmetry Impaired: Impaired left, Strength Impaired: Impaired left, Comment: Lingual ROM impaired to rt cheek; coordination in alternate motion (labial margin to margin impaired, and in diadochokinetic word "buttercup" x10)  Velum: WFL Mandible: WFL Cough: WFL Voice: WFL  MOTOR SPEECH: Overall motor speech: impaired Level of impairment: Phrase, sentence, and conversation Respiration: thoracic breathing Phonation: normal Resonance: WFL Articulation: Impaired: phrase, sentence, and conversation Intelligibility: Intelligibility reduced in conversation 85-90% Motor planning: Impaired: aware and groping for words Interfering components: premorbid status (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 SLP educated pt and daughter Hildred Laser how to have pt say these phrases. Pt may have had some s/sx of verbal apraxia with her HEP, and limited overt s/sx verbal apraxia in conversation today. SLP questions if pt's baseline or if this is new since latest CVA. Pt will need oral strengthening exercises/HEP. Pt uses phone for calling only.   PATIENT EDUCATION: Education details: HEP procedure, ratilonale Person educated: Patient and Child(ren) Education method: Explanation, Demonstration, Verbal cues, and Handouts Education comprehension: verbalized understanding, returned demonstration, verbal cues required, and needs further education     GOALS: Goals reviewed with patient? Yes  SHORT TERM GOALS: Target date: 05/28/2022    Pt will perform HEP for dysarthria compensation (overarticulation) with rare min A in 3 sessions Baseline: Goal status: INITIAL  2.  Pt will perform HEP for dysarthria (oral motor strength) with rare min A in 3 sessions Baseline:  Goal status: INITIAL  3.  Pt will engage in 5 minutes simple conversation with 95%+ intelligibility in 2 sessions Baseline:  Goal status: INITIAL  4.  Pt will complete speech PROM in first two therapy sessions Baseline:  Goal status: INITIAL   LONG TERM GOALS: Target date: 07/22/2022    Pt will perform HEP for dysarthria compensation (overarticulation) with modified independence or SBA in 3 sessions Baseline:  Goal status: INITIAL  2.  Pt will perform HEP for dysarthria (oral motor strength) with modified independence or SBA in 3 sessions Baseline:  Goal status: INITIAL  3.  Pt will engage in 15 minutes  simple-mod complex conversation with 95%+ intelligibility with compensations in 3 sessions Baseline:  Goal status: INITIAL  4.  Pt will improve score on speech PROM taken in her last two therapy sessions Baseline:  Goal status:  INITIAL   ASSESSMENT:  CLINICAL IMPRESSION: Patient is a 81 y.o. female who was seen today for assessment of dysarthria. Pt with possible memory deficit - may not be new as of most recent CVA; further assessment may be needed. Pt was approx 85-90% intelligible during conversation which occurred today during evaluation. When asked to repeat, Stormee slowed and exaggerated her speech to improve intelligibility to >95%. No spontaneous slowing or exaggerating of subsequent speech when SLP asked pt to repeat when needed for 2 minutes. 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  OBJECTIVE IMPAIRMENTS include memory, apraxia, and dysarthria. These impairments are limiting patient from household responsibilities and effectively communicating at home and in community. Factors affecting potential to achieve goals and functional outcome are previous level of function.. Patient will benefit from skilled SLP services to address above impairments and improve overall function.  REHAB POTENTIAL: Good  PLAN: SLP FREQUENCY: 2x/week  SLP DURATION: 12 weeks  PLANNED INTERVENTIONS: Environmental controls, Cueing hierachy, Cognitive reorganization, Internal/external aids, Oral motor exercises, Functional tasks, Multimodal communication approach, SLP instruction and feedback, Compensatory strategies, and Patient/family education    [facility], [place] 04/22/2022, 5:07 PM

## 2022-04-23 ENCOUNTER — Ambulatory Visit: Payer: Medicare HMO | Attending: Neurology

## 2022-04-23 DIAGNOSIS — R262 Difficulty in walking, not elsewhere classified: Secondary | ICD-10-CM | POA: Diagnosis present

## 2022-04-23 DIAGNOSIS — M6281 Muscle weakness (generalized): Secondary | ICD-10-CM | POA: Insufficient documentation

## 2022-04-23 DIAGNOSIS — G8929 Other chronic pain: Secondary | ICD-10-CM | POA: Diagnosis present

## 2022-04-23 DIAGNOSIS — M5442 Lumbago with sciatica, left side: Secondary | ICD-10-CM | POA: Diagnosis present

## 2022-04-23 DIAGNOSIS — R482 Apraxia: Secondary | ICD-10-CM | POA: Insufficient documentation

## 2022-04-23 DIAGNOSIS — R2681 Unsteadiness on feet: Secondary | ICD-10-CM | POA: Insufficient documentation

## 2022-04-23 DIAGNOSIS — R471 Dysarthria and anarthria: Secondary | ICD-10-CM | POA: Insufficient documentation

## 2022-04-23 DIAGNOSIS — R278 Other lack of coordination: Secondary | ICD-10-CM | POA: Diagnosis present

## 2022-04-23 DIAGNOSIS — R41841 Cognitive communication deficit: Secondary | ICD-10-CM | POA: Insufficient documentation

## 2022-04-23 DIAGNOSIS — I69354 Hemiplegia and hemiparesis following cerebral infarction affecting left non-dominant side: Secondary | ICD-10-CM | POA: Insufficient documentation

## 2022-04-23 DIAGNOSIS — M5441 Lumbago with sciatica, right side: Secondary | ICD-10-CM | POA: Insufficient documentation

## 2022-04-23 NOTE — Patient Instructions (Addendum)
OPEN YOUR MOUTH and talk plain!  Repeat each 2x, 2x/day  Red leather, yellow leather Buckle that bracketSLP  Flash message Five valve levers Three free throws [place] A calendar of [place], [place]  Give me five flapjacks  Great scott!  [place] Take the tackle box [employer] copper coffee pot Four floors to cover [employer] ball Pick up [employer] into bed Call the cat "Buttercup" Give the game to [place]

## 2022-04-26 NOTE — Therapy (Signed)
OUTPATIENT PHYSICAL THERAPY NEURO EVALUATION   Patient Name: Pamela Cox MRN: [id number] DOB:Jan 17, 1941, 81 y.o., female Today's Date: 04/28/2022   PCP: [facility] REFERRING PROVIDER: Kalman Drape, DO    PT End of Session - 04/28/22 1650     Visit Number 1    Number of Visits 13    Date for PT Re-Evaluation 06/09/22    Authorization Type [employer]    PT Start Time 1537    PT Stop Time 3875    PT Time Calculation (min) 37 min    Equipment Utilized During Treatment Gait belt    Activity Tolerance Patient tolerated treatment well    Behavior During Therapy WFL for tasks assessed/performed             Past Medical History:  Diagnosis Date   Arthritis    CML (chronic myelocytic leukemia) ([place])    CVA (cerebral vascular accident) ([place])    Diabetes mellitus without complication ([place])    GERD (gastroesophageal reflux disease)    Hypertension    Myelocytic leukemia, chronic ([place])    Obesity    Sleep apnea    Past Surgical History:  Procedure Laterality Date   APPENDECTOMY     COLONOSCOPY     COLONOSCOPY WITH PROPOFOL N/A 07/16/2019   Procedure: COLONOSCOPY WITH PROPOFOL;  Surgeon: Lollie Sails, MD;  Location: [facility] ENDOSCOPY;  Service: Endoscopy;  Laterality: N/A;   ESOPHAGOGASTRODUODENOSCOPY (EGD) WITH PROPOFOL N/A 07/16/2019   Procedure: ESOPHAGOGASTRODUODENOSCOPY (EGD) WITH PROPOFOL;  Surgeon: Lollie Sails, MD;  Location[facility] ENDOSCOPY;  Service: Endoscopy;  Laterality: N/A;   TOTAL SHOULDER REPLACEMENT Right 2021   TUBAL LIGATION     Patient Active Problem List   Diagnosis Date Noted   Allergic rhinitis due to pollen 08/23/2018   DDD (degenerative disc disease), lumbar 08/23/2018   Lumbar neuritis 08/23/2018   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   GERD (gastroesophageal reflux disease) 08/23/2018   Morbid obesity ([place]) 08/23/2018   Obstructive sleep apnea 08/23/2018   Intervertebral disc disorder with radiculopathy of lumbar  region 08/23/2018   CML (chronic myelocytic leukemia) ([place]) 08/23/2018   Hx of myocardial perfusion scan 08/23/2018   Osteoarthritis 08/23/2018   Degenerative arthritis of hip 08/23/2018   Lumbar spondylosis 08/23/2018   SOB (shortness of breath) 08/23/2018   History of CVA (cerebrovascular accident) 08/23/2018   Essential hypertension 08/23/2018   Chronic right shoulder pain 08/23/2018   Complete tear of right rotator cuff 08/23/2018   Acute pain of left shoulder 08/23/2018   On antineoplastic chemotherapy 08/23/2018   Hyperlipidemia 08/23/2018   Neurogenic claudication due to lumbar spinal stenosis 08/23/2018   Vitamin D deficiency 08/23/2018    ONSET DATE: 04/13/22  REFERRING DIAG: I63.81 (ICD-10-CM) - Other cerebral infarction due to occlusion or stenosis of small artery   THERAPY DIAG:  Muscle weakness (generalized)  Difficulty in walking, not elsewhere classified  Unsteadiness on feet  Rationale for Evaluation and Treatment Rehabilitation  SUBJECTIVE:  SUBJECTIVE STATEMENT: Patient confirms that she had a stroke last month which affected the L arm and leg. Notes weakness but denies N/T. Also reports a prior strokes that also affected the L UE/LE. Reports that imaging identified a stroke sometime before 2010, and another one in 2010. Was walking [facility] RW at [facility], sometimes without. Denies dizziness but does report that she take Meclizine for dizziness PRN.  Pt accompanied by: self  PERTINENT HISTORY: Chronic Myelocytic leukemia, prior MCA CVA (no residual deficits) and TIA, peripheral arterial disease, GERD, HTN, DMII, R TSA 2021  PAIN:  Are you having pain? Yes: NPRS scale: 0/10 Pain location: LB Pain description: sore Aggravating factors: in the AM Relieving factors:  Gabapentin  PRECAUTIONS: Fall  WEIGHT BEARING RESTRICTIONS No  FALLS: Has patient fallen in last 6 months? No  LIVING ENVIRONMENT: Lives with: lives with their daughter; daughter helps with getting in/out of shower, have cleaners come in every 2 weeks Lives in: House/apartment Stairs: Yes: Internal: 14 steps; on left going up and External: 0 steps; none Has following equipment at home: Single point cane, Walker - 2 wheeled, Environmental consultant - 4 wheeled, Manufacturing engineer; owns a lift chair  PLOF: Independent with basic ADLs  PATIENT GOALS work on strengthening and speech  OBJECTIVE:   DIAGNOSTIC FINDINGS: MR without contrast (04/13/22) Acute, small lacunar infarct in posterior limb of the right internal capsule. Sequelae of remote left parietal lobe infarct.  CT Brain without contrast (04/13/22) Unchanged background of chronic microvascular ischemic white matter changes and scattered remote small vessel infarcts.  COGNITION: Overall cognitive status: No family/caregiver present to determine baseline cognitive functioning   SENSATION: WFL  COORDINATION: Alt pronation/supination slightly dysmetric on L; B heel to shin limited by strength/ROM   MUSCLE TONE: normal in B LEs  POSTURE: rounded shoulders, forward head, increased thoracic kyphosis, and posterior pelvic tilt  LOWER EXTREMITY ROM:     Active  Right Eval Left Eval  Hip flexion    Hip extension    Hip abduction    Hip adduction    Hip internal rotation    Hip external rotation    Knee flexion    Knee extension    Ankle dorsiflexion 10 5  Ankle plantarflexion    Ankle inversion    Ankle eversion     (Blank rows = not tested)  LOWER EXTREMITY MMT:    MMT (in sitting) Right Eval Left Eval  Hip flexion 4+ 4+  Hip extension    Hip abduction 4 4-  Hip adduction 4 4  Hip internal rotation    Hip external rotation    Knee flexion 4 4  Knee extension 4+ 4  Ankle dorsiflexion 4 4-  Ankle plantarflexion 4 4  Ankle  inversion    Ankle eversion    (Blank rows = not tested)   GAIT: Gait pattern: step to pattern, step through pattern, decreased step length- Right, decreased stance time- Left, and trunk flexed Assistive device utilized: Environmental consultant - 2 wheeled Level of assistance: Modified independence   FUNCTIONAL TESTs:  5 times sit to stand: 17.36 sec with B UE support from 21 inch seat height Timed up and go (TUG): 27/77 sec with RW; imbalance and trouble staying within confines of walker during turns  PATIENT SURVEYS:  FOTO [phone]    PATIENT EDUCATION: Education details: prognosis, POC, HEP Person educated: Patient Education method: Explanation, Demonstration, Tactile cues, Verbal cues, and Handouts Education comprehension: verbalized understanding   HOME EXERCISE PROGRAM: Access Code:  [email or web address] URL: [email or web address] Date: 04/28/2022 Prepared by: [facility]  Exercises - Heel Toe Raises with Counter Support  - 1 x daily - 5 x weekly - 2 sets - 10 reps - Sit to Stand with Counter Support  - 1 x daily - 5 x weekly - 2 sets - 10 reps - Side Stepping with Counter Support  - 1 x daily - 5 x weekly - 2 sets - 10 reps - Standing Gastroc Stretch at Counter  - 1 x daily - 5 x weekly - 2 sets - 30 sec hold    GOALS: Goals reviewed with patient? Yes  SHORT TERM GOALS: Target date: 05/19/2022  Patient to be independent with initial HEP. Baseline: HEP initiated Goal status: INITIAL    LONG TERM GOALS: Target date: 06/09/2022  Patient to be independent with advanced HEP. Baseline: Not yet initiated  Goal status: INITIAL  Patient to demonstrate B LE strength >/=4+/5.  Baseline: See above Goal status: INITIAL  Patient to demonstrate at least 10 deg L ankle dorsiflexion AROM.Marland Kitchen  Baseline: 5 deg Goal status: INITIAL  Patient to demonstrate alternating reciprocal pattern when ascending and descending stairs with good stability and 1  handrail as needed.   Baseline: NT Goal status: INITIAL  Patient to complete TUG in <14 sec with LRAD in order to decrease risk of falls.   Baseline: 27.77 sec with RW Goal status: INITIAL  Patient to demonstrate 5xSTS test in <15 sec in order to decrease risk of falls.  Baseline: 17.36 sec with B UE support from 21 inch seat height Goal status: INITIAL  Patient to score at least 45/56 on Berg in order to decrease risk of falls.  Baseline: NT Goal status: INITIAL   ASSESSMENT:  CLINICAL IMPRESSION:  Patient is a 81 y/o F presenting to OPPT with c/o L hemi-body weakness s/p R lacunar stroke on 04/13/22. Patient reports previous strokes which have also affected the L side. Was using RW at [facility] and continues to do so. Patient today presenting with mild L hand dysmetria, rounded posture, limited L>R ankle DF AROM, decreased L>R LE strength, gait deviations, and imbalance. Patient scored on TUG and 5xSTS indicates an increased risk of falls. Patient was educated on gentle strengthening and balance HEP and reported understanding. Would benefit from skilled PT services 2 x/week for 6 weeks to address aforementioned impairments in order to optimize level of function.     OBJECTIVE IMPAIRMENTS Abnormal gait, decreased balance, decreased coordination, decreased endurance, decreased mobility, difficulty walking, decreased ROM, decreased strength, decreased safety awareness, impaired flexibility, improper body mechanics, postural dysfunction, and pain.   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   REHAB POTENTIAL: Good  CLINICAL DECISION MAKING: Evolving/moderate complexity  EVALUATION COMPLEXITY: Moderate  PLAN: PT FREQUENCY: 2x/week  PT DURATION: 6 weeks  PLANNED INTERVENTIONS: Therapeutic exercises, Therapeutic activity, Neuromuscular re-education, Balance training, Gait training, Patient/Family education, Joint mobilization, Stair training, Vestibular training, Canalith repositioning, DME instructions, Aquatic Therapy, Dry Needling, Electrical stimulation, Cryotherapy, Moist heat, Taping, Manual therapy, and Re-evaluation  PLAN FOR  NEXT SESSION: assess Berg, stair navigation, progress LE strength and balance    Janene Harvey, PT, DPT 04/28/22 4:57 PM  [facility] at [facility] [place], [place], [place] [address] Phone # [phone] Fax # [phone]

## 2022-04-28 ENCOUNTER — Encounter: Payer: Self-pay | Admitting: Speech Pathology

## 2022-04-28 ENCOUNTER — Ambulatory Visit: Payer: Medicare HMO | Admitting: Physical Therapy

## 2022-04-28 ENCOUNTER — Ambulatory Visit: Payer: Medicare HMO | Admitting: Speech Pathology

## 2022-04-28 ENCOUNTER — Encounter: Payer: Self-pay | Admitting: Physical Therapy

## 2022-04-28 ENCOUNTER — Ambulatory Visit: Payer: Medicare HMO | Admitting: Occupational Therapy

## 2022-04-28 DIAGNOSIS — M6281 Muscle weakness (generalized): Secondary | ICD-10-CM

## 2022-04-28 DIAGNOSIS — R471 Dysarthria and anarthria: Secondary | ICD-10-CM | POA: Diagnosis not present

## 2022-04-28 DIAGNOSIS — R2681 Unsteadiness on feet: Secondary | ICD-10-CM

## 2022-04-28 DIAGNOSIS — R278 Other lack of coordination: Secondary | ICD-10-CM

## 2022-04-28 DIAGNOSIS — I69354 Hemiplegia and hemiparesis following cerebral infarction affecting left non-dominant side: Secondary | ICD-10-CM

## 2022-04-28 DIAGNOSIS — R262 Difficulty in walking, not elsewhere classified: Secondary | ICD-10-CM

## 2022-04-28 NOTE — Therapy (Signed)
OUTPATIENT SPEECH LANGUAGE PATHOLOGY TREATMENT NOTE   Patient Name: Pamela Cox MRN: [id number] DOB:July 21, 1941, 81 y.o., female Today's Date: 04/28/2022  PCP: Loann Quill, MD REFERRING PROVIDER: Jerold Coombe, DO  END OF SESSION:   End of Session - 04/28/22 1329     Visit Number 2    Number of Visits 23    Date for SLP Re-Evaluation 07/22/22    SLP Start Time 1315    SLP Stop Time  39    SLP Time Calculation (min) 40 min    Activity Tolerance Patient tolerated treatment well             Past Medical History:  Diagnosis Date   Arthritis    CML (chronic myelocytic leukemia) ([place])    CVA (cerebral vascular accident) ([place])    Diabetes mellitus without complication ([place])    GERD (gastroesophageal reflux disease)    Hypertension    Myelocytic leukemia, chronic ([place])    Obesity    Sleep apnea    Past Surgical History:  Procedure Laterality Date   APPENDECTOMY     COLONOSCOPY     COLONOSCOPY WITH PROPOFOL N/A 07/16/2019   Procedure: COLONOSCOPY WITH PROPOFOL;  Surgeon: Lollie Sails, MD;  Location: [facility] ENDOSCOPY;  Service: Endoscopy;  Laterality: N/A;   ESOPHAGOGASTRODUODENOSCOPY (EGD) WITH PROPOFOL N/A 07/16/2019   Procedure: ESOPHAGOGASTRODUODENOSCOPY (EGD) WITH PROPOFOL;  Surgeon: Lollie Sails, MD;  Location: [facility] ENDOSCOPY;  Service: Endoscopy;  Laterality: N/A;   TOTAL SHOULDER REPLACEMENT Right 2021   TUBAL LIGATION     Patient Active Problem List   Diagnosis Date Noted   Allergic rhinitis due to pollen 08/23/2018   DDD (degenerative disc disease), lumbar 08/23/2018   Lumbar neuritis 08/23/2018   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   GERD (gastroesophageal reflux disease) 08/23/2018   Morbid obesity ([place]) 08/23/2018   Obstructive sleep apnea 08/23/2018   Intervertebral disc disorder with radiculopathy of lumbar region 08/23/2018   CML (chronic myelocytic leukemia) ([place]) 08/23/2018   Hx of myocardial perfusion scan 08/23/2018    Osteoarthritis 08/23/2018   Degenerative arthritis of hip 08/23/2018   Lumbar spondylosis 08/23/2018   SOB (shortness of breath) 08/23/2018   History of CVA (cerebrovascular accident) 08/23/2018   Essential hypertension 08/23/2018   Chronic right shoulder pain 08/23/2018   Complete tear of right rotator cuff 08/23/2018   Acute pain of left shoulder 08/23/2018   On antineoplastic chemotherapy 08/23/2018   Hyperlipidemia 08/23/2018   Neurogenic claudication due to lumbar spinal stenosis 08/23/2018   Vitamin D deficiency 08/23/2018    ONSET DATE: 04/13/22  REFERRING DIAG: Lacunar infarct, acute  THERAPY DIAG:  Dysarthria and anarthria  Rationale for Evaluation and Treatment Rehabilitation  SUBJECTIVE: "He told me to slow down."  PAIN:  Are you having pain? Yes NPRS scale: 5/10 Pain location: Lower back Pain orientation: Bilateral  PAIN TYPE: aching Pain description: intermittent  Aggravating factors: NA Relieving factors: medication    OBJECTIVE:  TODAY'S TREATMENT:   04/28/22: Pt was seen for skilled ST services with focus on dysarthria. SLP educated on "Be Clear" strategy. Discussed re: Speaking louder, speaking slower, and moving mouth more (over-articulation). Pt intelligibility was subjectively observed to be around 50-75% in conversation. SLP facilitated session by having patient producing functional sentences practicing each strategy. Pt required minA overall. Pt reported it required "1" level effort to speak louder and speak slower in given sentences, and a level "3" with over-articulation. To begin discussing "Techniques for Improving Comprehensibility for the Speaker with  Dysarthria" next session (left in black file box). Cont with current POC.      PATIENT EDUCATION: Education details: HEP procedure, ratilonale Person educated: Patient and Child(ren) Education method: Explanation, Demonstration, Verbal cues, and Handouts Education comprehension: verbalized  understanding, returned demonstration, verbal cues required, and needs further education         GOALS: Goals reviewed with patient? Yes   SHORT TERM GOALS: Target date: 05/28/2022     Pt will perform HEP for dysarthria compensation (overarticulation) with rare min A in 3 sessions Baseline: Goal status: INITIAL   2.  Pt will perform HEP for dysarthria (oral motor strength) with rare min A in 3 sessions Baseline:  Goal status: INITIAL   3.  Pt will engage in 5 minutes simple conversation with 95%+ intelligibility in 2 sessions Baseline:  Goal status: INITIAL   4.  Pt will complete speech PROM in first two therapy sessions Baseline:  Goal status: INITIAL     LONG TERM GOALS: Target date: 07/22/2022     Pt will perform HEP for dysarthria compensation (overarticulation) with modified independence or SBA in 3 sessions Baseline:  Goal status: INITIAL   2.  Pt will perform HEP for dysarthria (oral motor strength) with modified independence or SBA in 3 sessions Baseline:  Goal status: INITIAL   3.  Pt will engage in 15 minutes simple-mod complex conversation with 95%+ intelligibility with compensations in 3 sessions Baseline:  Goal status: INITIAL   4.  Pt will improve score on speech PROM taken in her last two therapy sessions Baseline:  Goal status: INITIAL     ASSESSMENT:   CLINICAL IMPRESSION: Patient is a 81 y.o. female with diagnosis of dysarthria. See tx note. 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    OBJECTIVE IMPAIRMENTS include memory, apraxia, and dysarthria. These impairments are limiting patient from household responsibilities and effectively communicating at home and in community. Factors affecting potential to achieve goals and functional outcome are previous level of function.. Patient will benefit from skilled SLP services to address above impairments and improve overall function.    REHAB POTENTIAL: Good   PLAN: SLP FREQUENCY: 2x/week   SLP DURATION: 12 weeks   PLANNED INTERVENTIONS: Environmental controls, Cueing hierachy, Cognitive reorganization, Internal/external aids, Oral motor exercises, Functional tasks, Multimodal communication approach, SLP instruction and feedback, Compensatory strategies, and Patient/family education       [place], CCC-SLP 04/28/2022, 1:30 PM

## 2022-04-28 NOTE — Therapy (Signed)
OUTPATIENT OCCUPATIONAL THERAPY NEURO EVALUATION  Patient Name: Pamela Cox MRN: [id number] DOB:[birth year], 81 y.o., female Today's Date: 04/28/2022  PCP: [facility] REFERRING PROVIDER: Kalman Drape, DO    OT End of Session - 04/28/22 1542     Visit Number 1    Number of Visits 17    Date for OT Re-Evaluation 06/25/22    Authorization Type [employer] Medicare    OT Start Time 1450    OT Stop Time 1534    OT Time Calculation (min) 44 min    Activity Tolerance Patient tolerated treatment well    Behavior During Therapy WFL for tasks assessed/performed             Past Medical History:  Diagnosis Date   Arthritis    CML (chronic myelocytic leukemia) ([place])    CVA (cerebral vascular accident) ([place])    Diabetes mellitus without complication ([place])    GERD (gastroesophageal reflux disease)    Hypertension    Myelocytic leukemia, chronic ([place])    Obesity    Sleep apnea    Past Surgical History:  Procedure Laterality Date   APPENDECTOMY     COLONOSCOPY     COLONOSCOPY WITH PROPOFOL N/A 07/16/2019   Procedure: COLONOSCOPY WITH PROPOFOL;  Surgeon: Lollie Sails, MD;  Location: [facility] ENDOSCOPY;  Service: Endoscopy;  Laterality: N/A;   ESOPHAGOGASTRODUODENOSCOPY (EGD) WITH PROPOFOL N/A 07/16/2019   Procedure: ESOPHAGOGASTRODUODENOSCOPY (EGD) WITH PROPOFOL;  Surgeon: Lollie Sails, MD;  Location: [facility] ENDOSCOPY;  Service: Endoscopy;  Laterality: N/A;   TOTAL SHOULDER REPLACEMENT Right 2021   TUBAL LIGATION     Patient Active Problem List   Diagnosis Date Noted   Allergic rhinitis due to pollen 08/23/2018   DDD (degenerative disc disease), lumbar 08/23/2018   Lumbar neuritis 08/23/2018   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   GERD (gastroesophageal reflux disease) 08/23/2018   Morbid obesity ([place]) 08/23/2018   Obstructive sleep apnea 08/23/2018   Intervertebral disc disorder with radiculopathy of lumbar region 08/23/2018   CML (chronic myelocytic  leukemia) ([place]) 08/23/2018   Hx of myocardial perfusion scan 08/23/2018   Osteoarthritis 08/23/2018   Degenerative arthritis of hip 08/23/2018   Lumbar spondylosis 08/23/2018   SOB (shortness of breath) 08/23/2018   History of CVA (cerebrovascular accident) 08/23/2018   Essential hypertension 08/23/2018   Chronic right shoulder pain 08/23/2018   Complete tear of right rotator cuff 08/23/2018   Acute pain of left shoulder 08/23/2018   On antineoplastic chemotherapy 08/23/2018   Hyperlipidemia 08/23/2018   Neurogenic claudication due to lumbar spinal stenosis 08/23/2018   Vitamin D deficiency 08/23/2018    ONSET DATE: 04/12/22  REFERRING DIAG: I63.81 (ICD-10-CM) - Other cerebral infarction due to occlusion or stenosis of small artery   THERAPY DIAG:  Hemiplegia and hemiparesis following cerebral infarction affecting left non-dominant side (HCC)  Muscle weakness (generalized)  Other lack of coordination  Rationale for Evaluation and Treatment Rehabilitation  SUBJECTIVE:   SUBJECTIVE STATEMENT: Pt reports increased fatigue, difficulty with speech, sensations in face altered, and difficulty with taking care of self and balance impairments s/p most recent CVA. Pt accompanied by: self  PERTINENT HISTORY: CML, prior CVA (no residual deficits) and TIA, peripheral arterial disease, GERD, and HTN.  R Lacunar Stroke  PRECAUTIONS: Fall  WEIGHT BEARING RESTRICTIONS No  PAIN:  Are you having pain? Yes: NPRS scale: 5/10 Pain location: lower back Pain description: sore Aggravating factors: prolonged laying down and sitting down Relieving factors: pain medication, creams  FALLS:  Has patient fallen in last 6 months? No  LIVING ENVIRONMENT: Lives with: lives with their daughter Lives in: House/apartment Stairs: Yes: Internal: 15 steps; on left going up and External: 1 steps; none Has following equipment at home: Walker - 2 wheeled, Wheelchair (manual), shower chair, bed side  commode, and Grab bars  PLOF: Independent with basic ADLs and Requires assistive device for independence  PATIENT GOALS: to have more strength and walk better  OBJECTIVE:   HAND DOMINANCE: Right  ADLs: Transfers/ambulation related to ADLs: CGA to Supervision with RW Eating: Increased time, some difficulty with cutting foods Grooming: Min-mod assist, requires increased time UB Dressing: Increased time, "it's hard" LB Dressing: Increased time Toileting: Increased time, "it's hard sometimes" Bathing: currently sponge bath Tub Shower transfers: "scared to" Equipment: Shower seat with back, Grab bars, and bed side commode   IADLs: Shopping: daughter primarily does the shopping, would use the scooter if/when she would go out Light housekeeping: daughter is assisting now, pt would wash dishes and do laundry PTA Meal Prep: daughter is assisting now, pt "scared" to cook now Community mobility: daughter does most of the driving Medication management: daughter is assisting now, pt would take it independently PTA Financial management: daughter does the finances Handwriting: 100% legible and Increased time  MOBILITY STATUS: Needs Assist: CGA with RW   ACTIVITY TOLERANCE: Activity tolerance: WFL for all tasks assessed during evaluation  FUNCTIONAL OUTCOME MEASURES: FOTO: 55  UPPER EXTREMITY ROM     Active ROM Right eval Left eval  Shoulder flexion 110 (shoulder replacement) 125  Shoulder abduction    Shoulder adduction    Shoulder extension    Shoulder internal rotation WNL Slight decreased ROM  Shoulder external rotation WNL Slight decreased ROM  Elbow flexion WNL WNL  Elbow extension WNL WNL  Wrist flexion    Wrist extension    Wrist ulnar deviation    Wrist radial deviation    Wrist pronation    Wrist supination    (Blank rows = not tested)   UPPER EXTREMITY MMT:     MMT Right eval Left eval  Shoulder flexion 4/5 3/5  Shoulder abduction    Shoulder adduction     Shoulder extension    Shoulder internal rotation    Shoulder external rotation    Middle trapezius    Lower trapezius    Elbow flexion 4+/5 4/5  Elbow extension 4+/5 4/5  Wrist flexion    Wrist extension    Wrist ulnar deviation    Wrist radial deviation    Wrist pronation    Wrist supination    (Blank rows = not tested)  HAND FUNCTION: Loose gross grasp, will need to complete more formal assessment during next session.  COORDINATION: Finger Nose Finger test: mild impairment on L > R 9 Hole Peg test: Right: 35.41 sec; Left: 56.37 sec Box and Blocks:  Right 42 blocks, Left 33 blocks  SENSATION: WFL  COGNITION: Overall cognitive status: Within functional limits for tasks assessed and No family/caregiver present to determine baseline cognitive functioning  VISION: Subjective report: reports mild diplopia Baseline vision: Wears glasses for reading only Visual history: glaucoma and cataracts  VISION ASSESSMENT: To be further assessed in functional context Eye alignment: WFL Ocular ROM: WFL Tracking/Visual pursuits: Able to track stimulus in all quads without difficulty Saccades: [facility]  Patient has difficulty with following activities due to following visual impairments: reports mild diplopia, will need to be further assessed both formally and during functional activity  PERCEPTION: [facility]  PRAXIS: WFL   PATIENT EDUCATION: Education details: Educated on role and purpose of OT as well as potential interventions and goals for therapy based on initial evaluation findings.  Educated on BE FAST stroke signs and symptoms Person educated: Patient Education method: Explanation Education comprehension: verbalized understanding   HOME EXERCISE PROGRAM: TBD    GOALS: Goals reviewed with patient? No  SHORT TERM GOALS: Target date: 05/28/22   STG  Status:  1 Pt will be independent with HEP for [facility] to increase coordination as needed for ADLs and IADLs. Baseline:  Initial   2 Pt will verbalize understanding of adapted strategies to maximize safety and I with ADLs/ IADLs . Baseline:  Initial  3 Pt will increase 9 hole peg test by 8 seconds with LUE to demonstrate increased coordination at needed to complete ADLs and IADLs.  Baseline: Right: 35.41 sec; Left: 56.37 sec Initial  4 Pt will demonstrate ability to retrieve a lightweight object at moderate range to demonstrate increased LUE strength and ROM as need to complete ADLs and IADLs. Baseline:  Initial    LONG TERM GOALS: Target date: 06/25/22   LTG  Status:  1 Pt will be able to utilize LUE at non-dominant level to engage in self-care tasks without assistance. Baseline:  Initial  2 Pt will demonstrate improved UE functional use for ADLs as evidenced by increasing box/ blocks score by 5 blocks with LUE Baseline: Right 42 blocks, Left 33 blocks Initial  3 Pt will increase 9 hole peg test by 15 seconds with LUE to demonstrate increased coordination at needed to complete ADLs and IADLs.  Baseline: Right: 35.41 sec; Left: 56.37 sec Initial  4 Pt will demonstrate/report improvements in functional mobility and ADLs as reported on discharge functional status on FOTO by scoring >/= to 69. Baseline: 55 Initial  5 Pt will be able to engage in IADLs with improved safety awareness, body positioning, and use of LUE as non-dominant level. Baseline:  Initial    ASSESSMENT:  CLINICAL IMPRESSION: Patient is a 81 y.o. female who was seen today for occupational therapy evaluation for LUE weakness and decreased coordination impacting ability to engage in self-care and homemaking tasks at [facility]. Pt currently lives with daughter in a 2 story home with threshold to enter. PMHx includes CML, prior CVA (no residual deficits) and TIA, peripheral arterial disease, GERD, and HTN. Pt will benefit from skilled occupational therapy services to address strength and coordination, ROM, pain management, balance, GM/FM control, safety  awareness, introduction of compensatory strategies/AE prn, and implementation of an HEP to improve participation and safety during ADLs and IADLs.   PERFORMANCE DEFICITS in functional skills including ADLs, IADLs, coordination, dexterity, ROM, strength, pain, flexibility, FMC, GMC, mobility, balance, endurance, decreased knowledge of use of DME, and UE functional use.  IMPAIRMENTS are limiting patient from ADLs and IADLs.   COMORBIDITIES may have co-morbidities  that affects occupational performance. Patient will benefit from skilled OT to address above impairments and improve overall function.  MODIFICATION OR ASSISTANCE TO COMPLETE EVALUATION: Min-Moderate modification of tasks or assist with assess necessary to complete an evaluation.  OT OCCUPATIONAL PROFILE AND HISTORY: Detailed assessment: Review of records and additional review of physical, cognitive, psychosocial history related to current functional performance.  CLINICAL DECISION MAKING: Moderate - several treatment options, min-mod task modification necessary  REHAB POTENTIAL: Good  EVALUATION COMPLEXITY: Moderate    PLAN: OT FREQUENCY: 2x/week  OT DURATION: 8 weeks  PLANNED INTERVENTIONS: self care/ADL training, therapeutic exercise, therapeutic activity, neuromuscular  re-education, manual therapy, passive range of motion, balance training, functional mobility training, electrical stimulation, ultrasound, moist heat, cryotherapy, patient/family education, visual/perceptual remediation/compensation, energy conservation, and DME and/or AE instructions  RECOMMENDED OTHER SERVICES: NA  CONSULTED AND AGREED WITH PLAN OF CARE: Patient  PLAN FOR NEXT SESSION: Begin gross motor/functional reaching and initiate coordination HEP   Nayef College, [place], OTR/L 04/28/2022, 3:44 PM

## 2022-05-05 ENCOUNTER — Ambulatory Visit: Payer: Medicare HMO | Admitting: Speech Pathology

## 2022-05-05 ENCOUNTER — Ambulatory Visit: Payer: Medicare HMO

## 2022-05-11 NOTE — Therapy (Signed)
OUTPATIENT PHYSICAL THERAPY NEURO TREATMENT   Patient Name: Pamela Cox MRN: [id number] DOB:Nov 11, 1941, 81 y.o., female Today's Date: 05/12/2022   PCP: [facility] REFERRING PROVIDER: Kalman Drape, DO    PT End of Session - 05/12/22 1621     Visit Number 2    Number of Visits 13    Date for PT Re-Evaluation 06/09/22    Authorization Type Aetna Medicare    PT Start Time 1449    PT Stop Time 1531    PT Time Calculation (min) 42 min    Equipment Utilized During Treatment Gait belt    Activity Tolerance Patient tolerated treatment well    Behavior During Therapy WFL for tasks assessed/performed              Past Medical History:  Diagnosis Date   Arthritis    CML (chronic myelocytic leukemia) ([place])    CVA (cerebral vascular accident) ([place])    Diabetes mellitus without complication ([place])    GERD (gastroesophageal reflux disease)    Hypertension    Myelocytic leukemia, chronic ([place])    Obesity    Sleep apnea    Past Surgical History:  Procedure Laterality Date   APPENDECTOMY     COLONOSCOPY     COLONOSCOPY WITH PROPOFOL N/A 07/16/2019   Procedure: COLONOSCOPY WITH PROPOFOL;  Surgeon: Lollie Sails, MD;  Location: [facility] ENDOSCOPY;  Service: Endoscopy;  Laterality: N/A;   ESOPHAGOGASTRODUODENOSCOPY (EGD) WITH PROPOFOL N/A 07/16/2019   Procedure: ESOPHAGOGASTRODUODENOSCOPY (EGD) WITH PROPOFOL;  Surgeon: Lollie Sails, MD;  Location: [facility] ENDOSCOPY;  Service: Endoscopy;  Laterality: N/A;   TOTAL SHOULDER REPLACEMENT Right 2021   TUBAL LIGATION     Patient Active Problem List   Diagnosis Date Noted   Allergic rhinitis due to pollen 08/23/2018   DDD (degenerative disc disease), lumbar 08/23/2018   Lumbar neuritis 08/23/2018   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   GERD (gastroesophageal reflux disease) 08/23/2018   Morbid obesity ([place]) 08/23/2018   Obstructive sleep apnea 08/23/2018   Intervertebral disc disorder with radiculopathy of lumbar  region 08/23/2018   CML (chronic myelocytic leukemia) ([place]) 08/23/2018   Hx of myocardial perfusion scan 08/23/2018   Osteoarthritis 08/23/2018   Degenerative arthritis of hip 08/23/2018   Lumbar spondylosis 08/23/2018   SOB (shortness of breath) 08/23/2018   History of CVA (cerebrovascular accident) 08/23/2018   Essential hypertension 08/23/2018   Chronic right shoulder pain 08/23/2018   Complete tear of right rotator cuff 08/23/2018   Acute pain of left shoulder 08/23/2018   On antineoplastic chemotherapy 08/23/2018   Hyperlipidemia 08/23/2018   Neurogenic claudication due to lumbar spinal stenosis 08/23/2018   Vitamin D deficiency 08/23/2018    ONSET DATE: 04/13/22  REFERRING DIAG: I63.81 (ICD-10-CM) - Other cerebral infarction due to occlusion or stenosis of small artery   THERAPY DIAG:  Hemiplegia and hemiparesis following cerebral infarction affecting left non-dominant side (HCC)  Muscle weakness (generalized)  Difficulty in walking, not elsewhere classified  Unsteadiness on feet  Rationale for Evaluation and Treatment Rehabilitation  SUBJECTIVE:  SUBJECTIVE STATEMENT: Was seen in the ED since last session- thought she was having a stroke but this was ruled out. Does not feel any different now, except for some back pain d/t not getting an injection in a while. Was not able to do her HEP d/t her back pain.  Pt accompanied by: self  PERTINENT HISTORY: Chronic Myelocytic leukemia, prior MCA CVA (no residual deficits) and TIA, peripheral arterial disease, GERD, HTN, DMII, R TSA 2021  PAIN:  Are you having pain? Yes: NPRS scale: 3/10 Pain location: LB Pain description: dull Aggravating factors: all movements Relieving factors: Gabapentin  PRECAUTIONS: Fall  PATIENT GOALS work on  strengthening and speech  OBJECTIVE:     TODAY'S TREATMENT: 05/12/22 Activity Comments  Forward 10x3" and R/L diagonal 5x3" prayer stretch with green pball Pt noting good stretch  LTR x20 To tolerance  Hooklying pelvic tilt Attempted in sitting with only slightly improved form  STM to R>L lumbar paraspinals TTP here and along B PSIS  Sitting KTOS and fig 4 20" In chair with back rest for best stretch     [facility] PT Assessment - 05/12/22 0001       Standardized Balance Assessment   Standardized Balance Assessment Berg Balance Test      Berg Balance Test   Sit to Stand Able to stand  independently using hands    Standing Unsupported Able to stand 30 seconds unsupported    Sitting with Back Unsupported but Feet Supported on Floor or Stool Able to sit safely and securely 2 minutes    Stand to Sit Sits independently, has uncontrolled descent    Transfers Able to transfer with verbal cueing and /or supervision    Standing Unsupported with Eyes Closed Able to stand 3 seconds    Standing Unsupported with Feet Together Needs help to attain position and unable to hold for 15 seconds    From Standing, Reach Forward with Outstretched Arm Can reach forward >12 cm safely (5")              PATIENT EDUCATION: Education details: HEP update Person educated: Patient Education method: Explanation, Demonstration, Tactile cues, Verbal cues, and Handouts Education comprehension: verbalized understanding and returned demonstration   HOME EXERCISE PROGRAM Last updated: 05/12/22  Access Code: [email or web address] URL: [email or web address] Date: 05/12/2022 Prepared by: [facility]  Exercises - Heel Toe Raises with Counter Support  - 1 x daily - 5 x weekly - 2 sets - 10 reps - Sit to Stand with Counter Support  - 1 x daily - 5 x weekly - 2 sets - 10 reps - Side Stepping with Counter Support  - 1 x daily - 5 x weekly - 2 sets - 10 reps - Standing  Gastroc Stretch at Counter  - 1 x daily - 5 x weekly - 2 sets - 30 sec hold - Seated Flexion Stretch with Swiss Ball  - 1 x daily - 5 x weekly - 2 sets - 10 reps - 5 sec hold - Seated Thoracic Flexion and Rotation with Swiss Ball  - 1 x daily - 5 x weekly - 2 sets - 10 reps - 5 sec hold - Supine Lower Trunk Rotation  - 1 x daily - 5 x weekly - 2 sets - 10 reps - Seated Piriformis Stretch  - 1 x daily - 5 x weekly - 2 sets - 30 sec hold - Seated Figure 4 Piriformis Stretch  - 1 x  daily - 5 x weekly - 2 sets - 30 sec hold  Below measures were taken at time of initial evaluation unless otherwise specified:  DIAGNOSTIC FINDINGS: MR without contrast (04/13/22) Acute, small lacunar infarct in posterior limb of the right internal capsule. Sequelae of remote left parietal lobe infarct.  CT Brain without contrast (04/13/22) Unchanged background of chronic microvascular ischemic white matter changes and scattered remote small vessel infarcts.  COGNITION: Overall cognitive status: No family/caregiver present to determine baseline cognitive functioning   SENSATION: WFL  COORDINATION: Alt pronation/supination slightly dysmetric on L; B heel to shin limited by strength/ROM   MUSCLE TONE: normal in B LEs  POSTURE: rounded shoulders, forward head, increased thoracic kyphosis, and posterior pelvic tilt  LOWER EXTREMITY ROM:     Active  Right Eval Left Eval  Hip flexion    Hip extension    Hip abduction    Hip adduction    Hip internal rotation    Hip external rotation    Knee flexion    Knee extension    Ankle dorsiflexion 10 5  Ankle plantarflexion    Ankle inversion    Ankle eversion     (Blank rows = not tested)  LOWER EXTREMITY MMT:    MMT (in sitting) Right Eval Left Eval  Hip flexion 4+ 4+  Hip extension    Hip abduction 4 4-  Hip adduction 4 4  Hip internal rotation    Hip external rotation    Knee flexion 4 4  Knee extension 4+ 4  Ankle dorsiflexion 4 4-  Ankle  plantarflexion 4 4  Ankle inversion    Ankle eversion    (Blank rows = not tested)   GAIT: Gait pattern: step to pattern, step through pattern, decreased step length- Right, decreased stance time- Left, and trunk flexed Assistive device utilized: Environmental consultant - 2 wheeled Level of assistance: Modified independence   FUNCTIONAL TESTs:  5 times sit to stand: 17.36 sec with B UE support from 21 inch seat height Timed up and go (TUG): 27/77 sec with RW; imbalance and trouble staying within confines of walker during turns  PATIENT SURVEYS:  FOTO [phone]    PATIENT EDUCATION: Education details: prognosis, POC, HEP Person educated: Patient Education method: Explanation, Demonstration, Tactile cues, Verbal cues, and Handouts Education comprehension: verbalized understanding   HOME EXERCISE PROGRAM: Access Code: [email or web address] URL: [email or web address] Date: 04/28/2022 Prepared by: [facility]  Exercises - Heel Toe Raises with Counter Support  - 1 x daily - 5 x weekly - 2 sets - 10 reps - Sit to Stand with Counter Support  - 1 x daily - 5 x weekly - 2 sets - 10 reps - Side Stepping with Counter Support  - 1 x daily - 5 x weekly - 2 sets - 10 reps - Standing Gastroc Stretch at Counter  - 1 x daily - 5 x weekly - 2 sets - 30 sec hold    GOALS: Goals reviewed with patient? Yes  SHORT TERM GOALS: Target date: 05/19/2022  Patient to be independent with initial HEP. Baseline: HEP initiated Goal status: IN PROGRESS    LONG TERM GOALS: Target date: 06/09/2022  Patient to be independent with advanced HEP. Baseline: Not yet initiated  Goal status: IN PROGRESS  Patient to demonstrate B LE strength >/=4+/5.  Baseline: See above Goal status: IN PROGRESS  Patient to demonstrate at least 10 deg L ankle dorsiflexion AROM.[email or web address] Kitchen  Baseline: 5 deg Goal  status: IN PROGRESS  Patient to demonstrate alternating reciprocal pattern when ascending and  descending stairs with good stability and 1 handrail as needed.   Baseline: NT Goal status: IN PROGRESS  Patient to complete TUG in <14 sec with LRAD in order to decrease risk of falls.   Baseline: 27.77 sec with RW Goal status: IN PROGRESS  Patient to demonstrate 5xSTS test in <15 sec in order to decrease risk of falls.  Baseline: 17.36 sec with B UE support from 21 inch seat height Goal status: IN PROGRESS  Patient to score at least 45/56 on Berg in order to decrease risk of falls.  Baseline: NT Goal status: IN PROGRESS   ASSESSMENT:  CLINICAL IMPRESSION:  Patient arrived to session with report of being seen in the ED since last session d/t stroke-like symptoms. Reports that these symptoms have completely resolved and now only with remaining increase in LBP. Reports limited HEP adherence d/t this LBP. Initiated gentle lumbopelvic stretching to address patient's pain to allow for improved HEP compliance. Also worked on gentle hip stretching which patient reported good relief with. Started assessing Berg but limited by time, thus will continue assessment next session. Patient reported improvement in LBP at end of session.     OBJECTIVE IMPAIRMENTS Abnormal gait, decreased balance, decreased coordination, decreased endurance, decreased mobility, difficulty walking, decreased ROM, decreased strength, decreased safety awareness, impaired flexibility, improper body mechanics, postural dysfunction, and pain.   ACTIVITY LIMITATIONS carrying, lifting, bending, sitting, standing, squatting, stairs, transfers, bed mobility, bathing, toileting, dressing, reach over head, hygiene/grooming, and locomotion level  PARTICIPATION LIMITATIONS: meal prep, cleaning, laundry, shopping, community activity, and church  PERSONAL FACTORS Age, Fitness, Past/current experiences, Time since onset of injury/illness/exacerbation, and 3+ comorbidities: Chronic Myelocytic leukemia, prior MCA CVA (no residual  deficits) and TIA, peripheral arterial disease, GERD, HTN, DMII, R TSA 2021  are also affecting patient's functional outcome.   REHAB POTENTIAL: Good  CLINICAL DECISION MAKING: Evolving/moderate complexity  EVALUATION COMPLEXITY: Moderate  PLAN: PT FREQUENCY: 2x/week  PT DURATION: 6 weeks  PLANNED INTERVENTIONS: Therapeutic exercises, Therapeutic activity, Neuromuscular re-education, Balance training, Gait training, Patient/Family education, Joint mobilization, Stair training, Vestibular training, Canalith repositioning, DME instructions, Aquatic Therapy, Dry Needling, Electrical stimulation, Cryotherapy, Moist heat, Taping, Manual therapy, and Re-evaluation  PLAN FOR NEXT SESSION: assess Berg, stair navigation, progress LE strength and balance    Janene Harvey, PT, DPT 05/12/22 4:22 PM  [facility] at [facility] [address], [place], [place] [address] Phone # [phone] Fax # [phone]

## 2022-05-12 ENCOUNTER — Ambulatory Visit: Payer: Medicare HMO | Admitting: Physical Therapy

## 2022-05-12 ENCOUNTER — Ambulatory Visit: Payer: Medicare HMO | Admitting: Speech Pathology

## 2022-05-12 ENCOUNTER — Encounter: Payer: Self-pay | Admitting: Occupational Therapy

## 2022-05-12 ENCOUNTER — Ambulatory Visit: Payer: Medicare HMO | Admitting: Occupational Therapy

## 2022-05-12 ENCOUNTER — Encounter: Payer: Self-pay | Admitting: Speech Pathology

## 2022-05-12 ENCOUNTER — Encounter: Payer: Self-pay | Admitting: Physical Therapy

## 2022-05-12 DIAGNOSIS — R471 Dysarthria and anarthria: Secondary | ICD-10-CM | POA: Diagnosis not present

## 2022-05-12 DIAGNOSIS — R41841 Cognitive communication deficit: Secondary | ICD-10-CM

## 2022-05-12 DIAGNOSIS — R2681 Unsteadiness on feet: Secondary | ICD-10-CM

## 2022-05-12 DIAGNOSIS — I69354 Hemiplegia and hemiparesis following cerebral infarction affecting left non-dominant side: Secondary | ICD-10-CM

## 2022-05-12 DIAGNOSIS — R278 Other lack of coordination: Secondary | ICD-10-CM

## 2022-05-12 DIAGNOSIS — M6281 Muscle weakness (generalized): Secondary | ICD-10-CM

## 2022-05-12 DIAGNOSIS — R262 Difficulty in walking, not elsewhere classified: Secondary | ICD-10-CM

## 2022-05-12 NOTE — Therapy (Signed)
OUTPATIENT SPEECH LANGUAGE PATHOLOGY TREATMENT NOTE   Patient Name: Pamela Cox MRN: [id number] DOB:02-16-1941, 81 y.o., female Today's Date: 05/12/2022  PCP: Loann Quill, MD REFERRING PROVIDER: Jerold Coombe, DO  END OF SESSION:   End of Session - 05/12/22 1642     Visit Number 3    Number of Visits 23    Date for SLP Re-Evaluation 07/22/22    SLP Start Time 51    SLP Stop Time  1655    SLP Time Calculation (min) 39 min    Activity Tolerance Patient tolerated treatment well             Past Medical History:  Diagnosis Date   Arthritis    CML (chronic myelocytic leukemia) ([place])    CVA (cerebral vascular accident) ([place])    Diabetes mellitus without complication ([place])    GERD (gastroesophageal reflux disease)    Hypertension    Myelocytic leukemia, chronic ([place])    Obesity    Sleep apnea    Past Surgical History:  Procedure Laterality Date   APPENDECTOMY     COLONOSCOPY     COLONOSCOPY WITH PROPOFOL N/A 07/16/2019   Procedure: COLONOSCOPY WITH PROPOFOL;  Surgeon: Lollie Sails, MD;  Location: [facility] ENDOSCOPY;  Service: Endoscopy;  Laterality: N/A;   ESOPHAGOGASTRODUODENOSCOPY (EGD) WITH PROPOFOL N/A 07/16/2019   Procedure: ESOPHAGOGASTRODUODENOSCOPY (EGD) WITH PROPOFOL;  Surgeon: Lollie Sails, MD;  Location: [facility] ENDOSCOPY;  Service: Endoscopy;  Laterality: N/A;   TOTAL SHOULDER REPLACEMENT Right 2021   TUBAL LIGATION     Patient Active Problem List   Diagnosis Date Noted   Allergic rhinitis due to pollen 08/23/2018   DDD (degenerative disc disease), lumbar 08/23/2018   Lumbar neuritis 08/23/2018   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   GERD (gastroesophageal reflux disease) 08/23/2018   Morbid obesity ([place]) 08/23/2018   Obstructive sleep apnea 08/23/2018   Intervertebral disc disorder with radiculopathy of lumbar region 08/23/2018   CML (chronic myelocytic leukemia) ([place]) 08/23/2018   Hx of myocardial perfusion scan 08/23/2018    Osteoarthritis 08/23/2018   Degenerative arthritis of hip 08/23/2018   Lumbar spondylosis 08/23/2018   SOB (shortness of breath) 08/23/2018   History of CVA (cerebrovascular accident) 08/23/2018   Essential hypertension 08/23/2018   Chronic right shoulder pain 08/23/2018   Complete tear of right rotator cuff 08/23/2018   Acute pain of left shoulder 08/23/2018   On antineoplastic chemotherapy 08/23/2018   Hyperlipidemia 08/23/2018   Neurogenic claudication due to lumbar spinal stenosis 08/23/2018   Vitamin D deficiency 08/23/2018    ONSET DATE: 04/13/22  REFERRING DIAG: Lacunar infarct, acute  THERAPY DIAG:  Cognitive communication deficit  Dysarthria and anarthria  Rationale for Evaluation and Treatment Rehabilitation  SUBJECTIVE: "I feel like my speech is getting better."  PAIN:  Are you having pain? No    OBJECTIVE:  TODAY'S TREATMENT:   05/12/22: Pt was seen for skilled ST services with focus on cognition and dysarthria. Pt was admitted to hospital on 05/04/22. Pt reports speech is "the same if not better" when using her strategies (she was able to recall all strategies). SLP screened cognition today due to acute change. SLP administered SLUMS - pt scored a 9/30 indicating deficits in memory, attention, cognitive flexibility, and executive functioning skills. See below:   SLU Mental Status (SLUMS Examination)  Orientation: 3/3 Delayed Recall w/ Interference: 2/5 Numeric Calculation and Registration: 0/3 Immediate Recall w/ Interference (Generative naming): 2/3 Registration and Digit Span: 0/2 Visual Spatial/Exec Functioning: 2/6 Executive  Functioning/Extrapolation:  0/8  Pt reported her drooling is the same, but also reports anterior spillage from R side. SLP suggested pt use straw to assist in decreasing labial spillage. SLP suggest adding cognitive goals for patient with focus on memory. Cont with current POC.   04/28/22: Pt was seen for skilled ST services with  focus on dysarthria. SLP educated on "Be Clear" strategy. Discussed re: Speaking louder, speaking slower, and moving mouth more (over-articulation). Pt intelligibility was subjectively observed to be around 50-75% in conversation. SLP facilitated session by having patient producing functional sentences practicing each strategy. Pt required minA overall. Pt reported it required "1" level effort to speak louder and speak slower in given sentences, and a level "3" with over-articulation. To begin discussing "Techniques for Improving Comprehensibility for the Speaker with Dysarthria" next session (left in black file box). Cont with current POC.      PATIENT EDUCATION: Education details: HEP procedure, ratilonale Person educated: Patient and Child(ren) Education method: Explanation, Demonstration, Verbal cues, and Handouts Education comprehension: verbalized understanding, returned demonstration, verbal cues required, and needs further education         GOALS: Goals reviewed with patient? Yes   SHORT TERM GOALS: Target date: 05/28/2022     Pt will perform HEP for dysarthria compensation (overarticulation) with rare min A in 3 sessions Baseline: Goal status: INITIAL   2.  Pt will perform HEP for dysarthria (oral motor strength) with rare min A in 3 sessions Baseline:  Goal status: INITIAL   3.  Pt will engage in 5 minutes simple conversation with 95%+ intelligibility in 2 sessions Baseline:  Goal status: INITIAL   4.  Pt will complete speech PROM in first two therapy sessions Baseline:  Goal status: INITIAL     LONG TERM GOALS: Target date: 07/22/2022     Pt will perform HEP for dysarthria compensation (overarticulation) with modified independence or SBA in 3 sessions Baseline:  Goal status: INITIAL   2.  Pt will perform HEP for dysarthria (oral motor strength) with modified independence or SBA in 3 sessions Baseline:  Goal status: INITIAL   3.  Pt will engage in 15 minutes  simple-mod complex conversation with 95%+ intelligibility with compensations in 3 sessions Baseline:  Goal status: INITIAL   4.  Pt will improve score on speech PROM taken in her last two therapy sessions Baseline:  Goal status: INITIAL     ASSESSMENT:   CLINICAL IMPRESSION: Patient is a 81 y.o. female with diagnosis of dysarthria. See tx note. 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    OBJECTIVE IMPAIRMENTS include memory, apraxia, and dysarthria. These impairments are limiting patient from household responsibilities and effectively communicating at home and in community. Factors affecting potential to achieve goals and functional outcome are previous level of function.. Patient will benefit from skilled SLP services to address above impairments and improve overall function.   REHAB POTENTIAL: Good   PLAN: SLP FREQUENCY: 2x/week   SLP DURATION: 12 weeks   PLANNED INTERVENTIONS: Environmental controls, Cueing hierachy, Cognitive reorganization, Internal/external aids, Oral motor exercises, Functional tasks, Multimodal communication approach, SLP instruction and feedback, Compensatory strategies, and Patient/family education       [place], CCC-SLP 05/12/2022, 4:43 PM

## 2022-05-12 NOTE — Therapy (Unsigned)
OUTPATIENT OCCUPATIONAL THERAPY TREATMENT NOTE  Patient Name: Pamela Cox MRN: [id number] DOB:Jul 14, 1941, 81 y.o., female Today's Date: 05/12/2022  PCP: [facility] REFERRING PROVIDER: Kalman Drape, DO    OT End of Session - 05/12/22 1538     Visit Number 2    Number of Visits 17    Date for OT Re-Evaluation 06/25/22    Authorization Type [employer]    OT Start Time 1534    OT Stop Time 1614    OT Time Calculation (min) 40 min    Activity Tolerance Patient tolerated treatment well    Behavior During Therapy WFL for tasks assessed/performed            Past Medical History:  Diagnosis Date   Arthritis    CML (chronic myelocytic leukemia) ([place])    CVA (cerebral vascular accident) ([place])    Diabetes mellitus without complication ([place])    GERD (gastroesophageal reflux disease)    Hypertension    Myelocytic leukemia, chronic ([place])    Obesity    Sleep apnea    Past Surgical History:  Procedure Laterality Date   APPENDECTOMY     COLONOSCOPY     COLONOSCOPY WITH PROPOFOL N/A 07/16/2019   Procedure: COLONOSCOPY WITH PROPOFOL;  Surgeon: Lollie Sails, MD;  Location: [facility] ENDOSCOPY;  Service: Endoscopy;  Laterality: N/A;   ESOPHAGOGASTRODUODENOSCOPY (EGD) WITH PROPOFOL N/A 07/16/2019   Procedure: ESOPHAGOGASTRODUODENOSCOPY (EGD) WITH PROPOFOL;  Surgeon: Lollie Sails, MD;  Location: [facility] ENDOSCOPY;  Service: Endoscopy;  Laterality: N/A;   TOTAL SHOULDER REPLACEMENT Right 2021   TUBAL LIGATION     Patient Active Problem List   Diagnosis Date Noted   Allergic rhinitis due to pollen 08/23/2018   DDD (degenerative disc disease), lumbar 08/23/2018   Lumbar neuritis 08/23/2018   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   GERD (gastroesophageal reflux disease) 08/23/2018   Morbid obesity ([place]) 08/23/2018   Obstructive sleep apnea 08/23/2018   Intervertebral disc disorder with radiculopathy of lumbar region 08/23/2018   CML (chronic myelocytic leukemia)  ([place]) 08/23/2018   Hx of myocardial perfusion scan 08/23/2018   Osteoarthritis 08/23/2018   Degenerative arthritis of hip 08/23/2018   Lumbar spondylosis 08/23/2018   SOB (shortness of breath) 08/23/2018   History of CVA (cerebrovascular accident) 08/23/2018   Essential hypertension 08/23/2018   Chronic right shoulder pain 08/23/2018   Complete tear of right rotator cuff 08/23/2018   Acute pain of left shoulder 08/23/2018   On antineoplastic chemotherapy 08/23/2018   Hyperlipidemia 08/23/2018   Neurogenic claudication due to lumbar spinal stenosis 08/23/2018   Vitamin D deficiency 08/23/2018    ONSET DATE: 04/12/22  REFERRING DIAG: I63.81 (ICD-10-CM) - 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  Rationale for Evaluation and Treatment Rehabilitation  SUBJECTIVE:   SUBJECTIVE STATEMENT: *** Pt accompanied by: self  PAIN: Are you having pain? No  PERTINENT HISTORY: CML, prior CVA (no residual deficits) and TIA, peripheral arterial disease, GERD, and HTN.  R Lacunar Stroke  PRECAUTIONS: Fall  PLOF: Independent with basic ADLs and Requires assistive device for independence  PATIENT GOALS: to have more strength and walk better  OBJECTIVE:   HAND FUNCTION: Grip strength: Right: 29 lbs; Left: 24 lbs  TODAY'S TREATMENT - 05/12/22: Closed chain AAROM in supine Resistance clothespins Marbles on pegs   PATIENT EDUCATION: Education details: Educated on role and purpose of OT as well as potential interventions and goals for therapy  based on initial evaluation findings.  Educated on BE FAST stroke signs and symptoms Person educated: Patient Education method: Explanation Education comprehension: verbalized understanding   HOME EXERCISE PROGRAM: TBD   GOALS: Goals reviewed with patient? No  SHORT TERM GOALS: Target date: 05/28/22   STG  Status:  1 Pt will be independent with HEP for [facility] to increase  coordination as needed for ADLs and IADLs. Baseline:  Initial  2 Pt will verbalize understanding of adapted strategies to maximize safety and I with ADLs/ IADLs . Baseline:  Initial  3 Pt will increase 9 hole peg test by 8 seconds with LUE to demonstrate increased coordination at needed to complete ADLs and IADLs.  Baseline: Right: 35.41 sec; Left: 56.37 sec Initial  4 Pt will demonstrate ability to retrieve a lightweight object at moderate range to demonstrate increased LUE strength and ROM as need to complete ADLs and IADLs. Baseline:  Initial    LONG TERM GOALS: Target date: 06/25/22   LTG  Status:  1 Pt will be able to utilize LUE at non-dominant level to engage in self-care tasks without assistance. Baseline:  Initial  2 Pt will demonstrate improved UE functional use for ADLs as evidenced by increasing box/ blocks score by 5 blocks with LUE Baseline: Right 42 blocks, Left 33 blocks Initial  3 Pt will increase 9 hole peg test by 15 seconds with LUE to demonstrate increased coordination at needed to complete ADLs and IADLs.  Baseline: Right: 35.41 sec; Left: 56.37 sec Initial  4 Pt will demonstrate/report improvements in functional mobility and ADLs as reported on discharge functional status on FOTO by scoring >/= to 69. Baseline: 55 Initial  5 Pt will be able to engage in IADLs with improved safety awareness, body positioning, and use of LUE as non-dominant level. Baseline:  Initial    ASSESSMENT:  CLINICAL IMPRESSION: Patient is a 81 y.o. female who was seen today for occupational therapy evaluation for LUE weakness and decreased coordination impacting ability to engage in self-care and homemaking tasks at [facility]. Pt currently lives with daughter in a 2 story home with threshold to enter. PMHx includes CML, prior CVA (no residual deficits) and TIA, peripheral arterial disease, GERD, and HTN. Pt will benefit from skilled occupational therapy services to address strength and coordination,  ROM, pain management, balance, GM/FM control, safety awareness, introduction of compensatory strategies/AE prn, and implementation of an HEP to improve participation and safety during ADLs and IADLs.   PERFORMANCE DEFICITS in functional skills including ADLs, IADLs, coordination, dexterity, ROM, strength, pain, flexibility, FMC, GMC, mobility, balance, endurance, decreased knowledge of use of DME, and UE functional use.  IMPAIRMENTS are limiting patient from ADLs and IADLs.   COMORBIDITIES may have co-morbidities  that affects occupational performance. Patient will benefit from skilled OT to address above impairments and improve overall function.   PLAN: OT FREQUENCY: 2x/week  OT DURATION: 8 weeks  PLANNED INTERVENTIONS: self care/ADL training, therapeutic exercise, therapeutic activity, neuromuscular re-education, manual therapy, passive range of motion, balance training, functional mobility training, electrical stimulation, ultrasound, moist heat, cryotherapy, patient/family education, visual/perceptual remediation/compensation, energy conservation, and DME and/or AE instructions  RECOMMENDED OTHER SERVICES: NA  CONSULTED AND AGREED WITH PLAN OF CARE: Patient  PLAN FOR NEXT SESSION: Begin gross motor/functional reaching and initiate coordination HEP   Kathrine Cords, OTR/L 05/12/2022, 3:39 PM

## 2022-05-14 ENCOUNTER — Ambulatory Visit: Payer: Medicare HMO | Admitting: Occupational Therapy

## 2022-05-14 ENCOUNTER — Ambulatory Visit: Payer: Medicare HMO

## 2022-05-14 DIAGNOSIS — R471 Dysarthria and anarthria: Secondary | ICD-10-CM

## 2022-05-14 DIAGNOSIS — R262 Difficulty in walking, not elsewhere classified: Secondary | ICD-10-CM

## 2022-05-14 DIAGNOSIS — I69354 Hemiplegia and hemiparesis following cerebral infarction affecting left non-dominant side: Secondary | ICD-10-CM

## 2022-05-14 DIAGNOSIS — R482 Apraxia: Secondary | ICD-10-CM

## 2022-05-14 DIAGNOSIS — G8929 Other chronic pain: Secondary | ICD-10-CM

## 2022-05-14 DIAGNOSIS — R41841 Cognitive communication deficit: Secondary | ICD-10-CM

## 2022-05-14 DIAGNOSIS — R2681 Unsteadiness on feet: Secondary | ICD-10-CM

## 2022-05-14 DIAGNOSIS — M6281 Muscle weakness (generalized): Secondary | ICD-10-CM

## 2022-05-17 ENCOUNTER — Ambulatory Visit: Payer: Medicare HMO | Admitting: Occupational Therapy

## 2022-05-17 ENCOUNTER — Ambulatory Visit: Payer: Medicare HMO

## 2022-05-17 DIAGNOSIS — R262 Difficulty in walking, not elsewhere classified: Secondary | ICD-10-CM

## 2022-05-17 DIAGNOSIS — R2681 Unsteadiness on feet: Secondary | ICD-10-CM

## 2022-05-17 DIAGNOSIS — M6281 Muscle weakness (generalized): Secondary | ICD-10-CM

## 2022-05-17 DIAGNOSIS — I69354 Hemiplegia and hemiparesis following cerebral infarction affecting left non-dominant side: Secondary | ICD-10-CM

## 2022-05-17 DIAGNOSIS — R41841 Cognitive communication deficit: Secondary | ICD-10-CM

## 2022-05-17 DIAGNOSIS — R278 Other lack of coordination: Secondary | ICD-10-CM

## 2022-05-17 DIAGNOSIS — R471 Dysarthria and anarthria: Secondary | ICD-10-CM

## 2022-05-17 DIAGNOSIS — G8929 Other chronic pain: Secondary | ICD-10-CM

## 2022-05-17 DIAGNOSIS — R482 Apraxia: Secondary | ICD-10-CM

## 2022-05-17 NOTE — Therapy (Signed)
OUTPATIENT PHYSICAL THERAPY NEURO TREATMENT   Patient Name: Pamela Cox MRN: [id number] DOB:1941-11-21, 81 y.o., female Today's Date: 05/17/2022   PCP: [facility] REFERRING PROVIDER: Mabeline Caras, DO    PT End of Session - 05/17/22 1454     Visit Number 4    Number of Visits 13    Date for PT Re-Evaluation 06/09/22    Authorization Type [employer] Medicare    PT Start Time 1445    PT Stop Time 1530    PT Time Calculation (min) 45 min    Equipment Utilized During Treatment Gait belt    Activity Tolerance Patient tolerated treatment well    Behavior During Therapy WFL for tasks assessed/performed              Past Medical History:  Diagnosis Date   Arthritis    CML (chronic myelocytic leukemia) (HCC)    CVA (cerebral vascular accident) (HCC)    Diabetes mellitus without complication (HCC)    GERD (gastroesophageal reflux disease)    Hypertension    Myelocytic leukemia, chronic (HCC)    Obesity    Sleep apnea    Past Surgical History:  Procedure Laterality Date   APPENDECTOMY     COLONOSCOPY     COLONOSCOPY WITH PROPOFOL N/A 07/16/2019   Procedure: COLONOSCOPY WITH PROPOFOL;  Surgeon: Christena Deem, MD;  Location: [facility] ENDOSCOPY;  Service: Endoscopy;  Laterality: N/A;   ESOPHAGOGASTRODUODENOSCOPY (EGD) WITH PROPOFOL N/A 07/16/2019   Procedure: ESOPHAGOGASTRODUODENOSCOPY (EGD) WITH PROPOFOL;  Surgeon: Christena Deem, MD;  Location: [facility] ENDOSCOPY;  Service: Endoscopy;  Laterality: N/A;   TOTAL SHOULDER REPLACEMENT Right 2021   TUBAL LIGATION     Patient Active Problem List   Diagnosis Date Noted   Allergic rhinitis due to pollen 08/23/2018   DDD (degenerative disc disease), lumbar 08/23/2018   Lumbar neuritis 08/23/2018   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   GERD (gastroesophageal reflux disease) 08/23/2018   Morbid obesity (HCC) 08/23/2018   Obstructive sleep apnea 08/23/2018   Intervertebral disc disorder with radiculopathy of lumbar  region 08/23/2018   CML (chronic myelocytic leukemia) (HCC) 08/23/2018   Hx of myocardial perfusion scan 08/23/2018   Osteoarthritis 08/23/2018   Degenerative arthritis of hip 08/23/2018   Lumbar spondylosis 08/23/2018   SOB (shortness of breath) 08/23/2018   History of CVA (cerebrovascular accident) 08/23/2018   Essential hypertension 08/23/2018   Chronic right shoulder pain 08/23/2018   Complete tear of right rotator cuff 08/23/2018   Acute pain of left shoulder 08/23/2018   On antineoplastic chemotherapy 08/23/2018   Hyperlipidemia 08/23/2018   Neurogenic claudication due to lumbar spinal stenosis 08/23/2018   Vitamin D deficiency 08/23/2018    ONSET DATE: 04/13/22  REFERRING DIAG: I63.81 (ICD-10-CM) - Other cerebral infarction due to occlusion or stenosis of small artery   THERAPY DIAG:  Hemiplegia and hemiparesis following cerebral infarction affecting left non-dominant side (HCC)  Muscle weakness (generalized)  Difficulty in walking, not elsewhere classified  Unsteadiness on feet  Chronic bilateral low back pain with bilateral sciatica  Rationale for Evaluation and Treatment Rehabilitation  SUBJECTIVE:  SUBJECTIVE STATEMENT: Low back has been bothering more over the weekend.  Would like to start with MHP to loosen up the back Pt accompanied by: self  PERTINENT HISTORY: Chronic Myelocytic leukemia, prior MCA CVA (no residual deficits) and TIA, peripheral arterial disease, GERD, HTN, DMII, R TSA 2021  PAIN:  Are you having pain? Yes: NPRS scale: 7/10 Pain location: lumbar, poster/lat RLE Pain description: dull Aggravating factors: all movements Relieving factors: Gabapentin/moist heat  PRECAUTIONS: Fall  PATIENT GOALS work on strengthening and speech  OBJECTIVE:     TODAY'S TREATMENT: 05/17/22 Activity Comments  MHP applied to L-spine performing seated LE there ex 3x10 reps 2.5# ankle weights AP, hip add isom, LAQ,   Seated forward flex stretch 10x5 sec   Sidestepping 2.5# weight x 2 min   Static standing on foam 3x30 sec   Gastroc stretch 2x60 sec left/right              PATIENT EDUCATION: Education details: demonstration of "seated exercise for seniors" exercise video on YouTube for pt to safely perform to increase physical exercise at home Person educated: Patient, child Education method: Explanation, Demonstration, Tactile cues, Verbal cues, and Handouts Education comprehension: verbalized understanding and returned demonstration   HOME EXERCISE PROGRAM Last updated: 05/12/22  Access Code: [email or web address] URL: [email or web address] Date: 05/12/2022 Prepared by: [facility] - Outpatient  Rehab - [facility]  Exercises - Heel Toe Raises with Counter Support  - 1 x daily - 5 x weekly - 2 sets - 10 reps - Sit to Stand with Counter Support  - 1 x daily - 5 x weekly - 2 sets - 10 reps - Side Stepping with Counter Support  - 1 x daily - 5 x weekly - 2 sets - 10 reps - Standing Gastroc Stretch at Counter  - 1 x daily - 5 x weekly - 2 sets - 30 sec hold - Seated Flexion Stretch with Swiss Ball  - 1 x daily - 5 x weekly - 2 sets - 10 reps - 5 sec hold - Seated Thoracic Flexion and Rotation with Swiss Ball  - 1 x daily - 5 x weekly - 2 sets - 10 reps - 5 sec hold - Supine Lower Trunk Rotation  - 1 x daily - 5 x weekly - 2 sets - 10 reps - Seated Piriformis Stretch  - 1 x daily - 5 x weekly - 2 sets - 30 sec hold - Seated Figure 4 Piriformis Stretch  - 1 x daily - 5 x weekly - 2 sets - 30 sec hold  Below measures were taken at time of initial evaluation unless otherwise specified:  DIAGNOSTIC FINDINGS: MR without contrast (04/13/22) Acute, small lacunar infarct in posterior limb of the right internal capsule. Sequelae of remote left  parietal lobe infarct.  CT Brain without contrast (04/13/22) Unchanged background of chronic microvascular ischemic white matter changes and scattered remote small vessel infarcts.  COGNITION: Overall cognitive status: No family/caregiver present to determine baseline cognitive functioning   SENSATION: WFL  COORDINATION: Alt pronation/supination slightly dysmetric on L; B heel to shin limited by strength/ROM   MUSCLE TONE: normal in B LEs  POSTURE: rounded shoulders, forward head, increased thoracic kyphosis, and posterior pelvic tilt  LOWER EXTREMITY ROM:     Active  Right Eval Left Eval  Hip flexion    Hip extension    Hip abduction    Hip adduction    Hip internal rotation    Hip external rotation  Knee flexion    Knee extension    Ankle dorsiflexion 10 5  Ankle plantarflexion    Ankle inversion    Ankle eversion     (Blank rows = not tested)  LOWER EXTREMITY MMT:    MMT (in sitting) Right Eval Left Eval  Hip flexion 4+ 4+  Hip extension    Hip abduction 4 4-  Hip adduction 4 4  Hip internal rotation    Hip external rotation    Knee flexion 4 4  Knee extension 4+ 4  Ankle dorsiflexion 4 4-  Ankle plantarflexion 4 4  Ankle inversion    Ankle eversion    (Blank rows = not tested)   GAIT: Gait pattern: step to pattern, step through pattern, decreased step length- Right, decreased stance time- Left, and trunk flexed Assistive device utilized: Environmental consultant - 2 wheeled Level of assistance: Modified independence   FUNCTIONAL TESTs:  5 times sit to stand: 17.36 sec with B UE support from 21 inch seat height Timed up and go (TUG): 27/77 sec with RW; imbalance and trouble staying within confines of walker during turns [employer] Scale: 30/56 indicating high risk for falls  PATIENT SURVEYS:  FOTO [phone]    PATIENT EDUCATION: Education details: prognosis, POC, HEP Person educated: Patient Education method: Explanation, Demonstration, Tactile cues,  Verbal cues, and Handouts Education comprehension: verbalized understanding   HOME EXERCISE PROGRAM: Access Code: [email or web address] URL: [email or web address] Date: 04/28/2022 Prepared by: [facility] - Outpatient  Rehab - [facility]  Exercises - Heel Toe Raises with Counter Support  - 1 x daily - 5 x weekly - 2 sets - 10 reps - Sit to Stand with Counter Support  - 1 x daily - 5 x weekly - 2 sets - 10 reps - Side Stepping with Counter Support  - 1 x daily - 5 x weekly - 2 sets - 10 reps - Standing Gastroc Stretch at Counter  - 1 x daily - 5 x weekly - 2 sets - 30 sec hold    GOALS: Goals reviewed with patient? Yes  SHORT TERM GOALS: Target date: 05/19/2022  Patient to be independent with initial HEP. Baseline: HEP initiated Goal status: IN PROGRESS    LONG TERM GOALS: Target date: 06/09/2022  Patient to be independent with advanced HEP. Baseline: Not yet initiated  Goal status: IN PROGRESS  Patient to demonstrate B LE strength >/=4+/5.  Baseline: See above Goal status: IN PROGRESS  Patient to demonstrate at least 10 deg L ankle dorsiflexion AROM.Marland Kitchen  Baseline: 5 deg Goal status: IN PROGRESS  Patient to demonstrate alternating reciprocal pattern when ascending and descending stairs with good stability and 1 handrail as needed.   Baseline: NT Goal status: IN PROGRESS  Patient to complete TUG in <14 sec with LRAD in order to decrease risk of falls.   Baseline: 27.77 sec with RW Goal status: IN PROGRESS  Patient to demonstrate 5xSTS test in <15 sec in order to decrease risk of falls.  Baseline: 17.36 sec with B UE support from 21 inch seat height Goal status: IN PROGRESS  Patient to score at least 45/56 on Berg in order to decrease risk of falls.  Baseline: NT Goal status: IN PROGRESS   ASSESSMENT:  CLINICAL IMPRESSION:     Pt notes increase in her LBP over the weekend and radiating leg pain into the right posteriolateral thigh.  Pt notes that over  course of mobility and exercise activities the pain in back lessened.  Pt encouraged  to perform HEP on a consistent basis as these movements will help to address pain and to progress to trunk strength for stabilization.  Demo good static standing balance on foam with minimal sway appreciated. Continued sessions indicated to progress strength, balance, and activity tolerance to reduce risk for falls    OBJECTIVE IMPAIRMENTS Abnormal gait, decreased balance, decreased coordination, decreased endurance, decreased mobility, difficulty walking, decreased ROM, decreased strength, decreased safety awareness, impaired flexibility, improper body mechanics, postural dysfunction, and pain.   ACTIVITY LIMITATIONS carrying, lifting, bending, sitting, standing, squatting, stairs, transfers, bed mobility, bathing, toileting, dressing, reach over head, hygiene/grooming, and locomotion level  PARTICIPATION LIMITATIONS: meal prep, cleaning, laundry, shopping, community activity, and church  PERSONAL FACTORS Age, Fitness, Past/current experiences, Time since onset of injury/illness/exacerbation, and 3+ comorbidities: Chronic Myelocytic leukemia, prior MCA CVA (no residual deficits) and TIA, peripheral arterial disease, GERD, HTN, DMII, R TSA 2021  are also affecting patient's functional outcome.   REHAB POTENTIAL: Good  CLINICAL DECISION MAKING: Evolving/moderate complexity  EVALUATION COMPLEXITY: Moderate  PLAN: PT FREQUENCY: 2x/week  PT DURATION: 6 weeks  PLANNED INTERVENTIONS: Therapeutic exercises, Therapeutic activity, Neuromuscular re-education, Balance training, Gait training, Patient/Family education, Joint mobilization, Stair training, Vestibular training, Canalith repositioning, DME instructions, Aquatic Therapy, Dry Needling, Electrical stimulation, Cryotherapy, Moist heat, Taping, Manual therapy, and Re-evaluation  PLAN FOR NEXT SESSION:  stair navigation, progress LE strength and balance     2:55 PM, 05/17/22 M. Shary Decamp, PT, DPT Physical Therapist- [place] Office Number: [phone]   [facility] at [facility] [address] [place], [place] [address] Phone # [phone] Fax # [phone]

## 2022-05-18 NOTE — Therapy (Signed)
OUTPATIENT PHYSICAL THERAPY NEURO TREATMENT   Patient Name: Pamela Cox MRN: [id number] DOB:[birth year], 81 y.o., female Today's Date: 05/19/2022   PCP: [facility] REFERRING PROVIDER: Kalman Drape, DO    PT End of Session - 05/19/22 1638     Visit Number 5    Number of Visits 13    Date for PT Re-Evaluation 06/09/22    Authorization Type A[employer]na Medicare    PT Start Time 1231    PT Stop Time 1314    PT Time Calculation (min) 43 min    Activity Tolerance Patient tolerated treatment well    Behavior During Therapy WFL for tasks assessed/performed               Past Medical History:  Diagnosis Date   Arthritis    CML (chronic myelocytic leukemia) ([place])    CVA (cerebral vascular accident) ([place])    Diabetes mellitus without complication ([place])    GERD (gastroesophageal reflux disease)    Hypertension    Myelocytic leukemia, chronic ([place])    Obesity    Sleep apnea    Past Surgical History:  Procedure Laterality Date   APPENDECTOMY     COLONOSCOPY     COLONOSCOPY WITH PROPOFOL N/A 07/16/2019   Procedure: COLONOSCOPY WITH PROPOFOL;  Surgeon: Lollie Sails, MD;  Location: [facility] ENDOSCOPY;  Service: Endoscopy;  Laterality: N/A;   ESOPHAGOGASTRODUODENOSCOPY (EGD) WITH PROPOFOL N/A 07/16/2019   Procedure: ESOPHAGOGASTRODUODENOSCOPY (EGD) WITH PROPOFOL;  Surgeon: Lollie Sails, MD;  Location: [facility] ENDOSCOPY;  Service: Endoscopy;  Laterality: N/A;   TOTAL SHOULDER REPLACEMENT Right 2021   TUBAL LIGATION     Patient Active Problem List   Diagnosis Date Noted   Allergic rhinitis due to pollen 08/23/2018   DDD (degenerative disc disease), lumbar 08/23/2018   Lumbar neuritis 08/23/2018   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   GERD (gastroesophageal reflux disease) 08/23/2018   Morbid obesity ([place]) 08/23/2018   Obstructive sleep apnea 08/23/2018   Intervertebral disc disorder with radiculopathy of lumbar region 08/23/2018   CML (chronic myelocytic  leukemia) ([place]) 08/23/2018   Hx of myocardial perfusion scan 08/23/2018   Osteoarthritis 08/23/2018   Degenerative arthritis of hip 08/23/2018   Lumbar spondylosis 08/23/2018   SOB (shortness of breath) 08/23/2018   History of CVA (cerebrovascular accident) 08/23/2018   Essential hypertension 08/23/2018   Chronic right shoulder pain 08/23/2018   Complete tear of right rotator cuff 08/23/2018   Acute pain of left shoulder 08/23/2018   On antineoplastic chemotherapy 08/23/2018   Hyperlipidemia 08/23/2018   Neurogenic claudication due to lumbar spinal stenosis 08/23/2018   Vitamin D deficiency 08/23/2018    ONSET DATE: 04/13/22  REFERRING DIAG: I63.81 (ICD-10-CM) - Other cerebral infarction due to occlusion or stenosis of small artery   THERAPY DIAG:  Hemiplegia and hemiparesis following cerebral infarction affecting left non-dominant side (HCC)  Muscle weakness (generalized)  Difficulty in walking, not elsewhere classified  Unsteadiness on feet  Rationale for Evaluation and Treatment Rehabilitation  SUBJECTIVE:  SUBJECTIVE STATEMENT: Low back is feeling better.  Pt accompanied by: self  PERTINENT HISTORY: Chronic Myelocytic leukemia, prior MCA CVA (no residual deficits) and TIA, peripheral arterial disease, GERD, HTN, DMII, R TSA 2021  PAIN:  Are you having pain? Yes: NPRS scale: 4-5/10 Pain location: LBP Pain description: dull Aggravating factors: all movements Relieving factors: Gabapentin/moist heat  PRECAUTIONS: Fall  PATIENT GOALS work on strengthening and speech  OBJECTIVE:     TODAY'S TREATMENT: 05/19/22 Activity Comments  L5 x 6 min Ues/LEs Cueing to pace herself to avoid quick fatigue; 36 SPM  romberg EO 2x30" Mild-moderate sway; heavy anterior trunk lean   Anterior/posterior wt shifts Difficulty understanding exercise despite cues, discontinued   R/L hip abd 10x  Trunk flexion and uncontrolled kicks   R/L hip ex 10x  Trunk flexion and uncontrolled kicks   Sitting red TB row 2x10 Cues to lean further forward to avoid reclining; correction of arm position required; performed for core stability for improved balance  Sitting ab set with red pball 10x5" Cues to avoid valsalva  sitting pelvic tilts 15x Demo required for proper movement pattern  STS elevated on 1 airex and using 1 UE support 2x10, without airex and with min A 5x Limited eccentric control     HOME EXERCISE PROGRAM Last updated: 05/12/22  Access Code: [email or web address] URL: [email or web address] Date: 05/12/2022 Prepared by: [facility]  Exercises - Heel Toe Raises with Counter Support  - 1 x daily - 5 x weekly - 2 sets - 10 reps - Sit to Stand with Counter Support  - 1 x daily - 5 x weekly - 2 sets - 10 reps - Side Stepping with Counter Support  - 1 x daily - 5 x weekly - 2 sets - 10 reps - Standing Gastroc Stretch at Counter  - 1 x daily - 5 x weekly - 2 sets - 30 sec hold - Seated Flexion Stretch with Swiss Ball  - 1 x daily - 5 x weekly - 2 sets - 10 reps - 5 sec hold - Seated Thoracic Flexion and Rotation with Swiss Ball  - 1 x daily - 5 x weekly - 2 sets - 10 reps - 5 sec hold - Supine Lower Trunk Rotation  - 1 x daily - 5 x weekly - 2 sets - 10 reps - Seated Piriformis Stretch  - 1 x daily - 5 x weekly - 2 sets - 30 sec hold - Seated Figure 4 Piriformis Stretch  - 1 x daily - 5 x weekly - 2 sets - 30 sec hold  Below measures were taken at time of initial evaluation unless otherwise specified:  DIAGNOSTIC FINDINGS: MR without contrast (04/13/22) Acute, small lacunar infarct in posterior limb of the right internal capsule. Sequelae of remote left parietal lobe infarct.  CT Brain without contrast (04/13/22) Unchanged background of  chronic microvascular ischemic white matter changes and scattered remote small vessel infarcts.  COGNITION: Overall cognitive status: No family/caregiver present to determine baseline cognitive functioning   SENSATION: WFL  COORDINATION: Alt pronation/supination slightly dysmetric on L; B heel to shin limited by strength/ROM   MUSCLE TONE: normal in B LEs  POSTURE: rounded shoulders, forward head, increased thoracic kyphosis, and posterior pelvic tilt  LOWER EXTREMITY ROM:     Active  Right Eval Left Eval  Hip flexion    Hip extension    Hip abduction    Hip adduction    Hip internal  rotation    Hip external rotation    Knee flexion    Knee extension    Ankle dorsiflexion 10 5  Ankle plantarflexion    Ankle inversion    Ankle eversion     (Blank rows = not tested)  LOWER EXTREMITY MMT:    MMT (in sitting) Right Eval Left Eval  Hip flexion 4+ 4+  Hip extension    Hip abduction 4 4-  Hip adduction 4 4  Hip internal rotation    Hip external rotation    Knee flexion 4 4  Knee extension 4+ 4  Ankle dorsiflexion 4 4-  Ankle plantarflexion 4 4  Ankle inversion    Ankle eversion    (Blank rows = not tested)   GAIT: Gait pattern: step to pattern, step through pattern, decreased step length- Right, decreased stance time- Left, and trunk flexed Assistive device utilized: Environmental consultant - 2 wheeled Level of assistance: Modified independence   FUNCTIONAL TESTs:  5 times sit to stand: 17.36 sec with B UE support from 21 inch seat height Timed up and go (TUG): 27/77 sec with RW; imbalance and trouble staying within confines of walker during turns [employer] Scale: 30/56 indicating high risk for falls  PATIENT SURVEYS:  FOTO [phone]    PATIENT EDUCATION: Education details: prognosis, POC, HEP Person educated: Patient Education method: Explanation, Demonstration, Tactile cues, Verbal cues, and Handouts Education comprehension: verbalized understanding   HOME  EXERCISE PROGRAM: Access Code: [email or web address] URL: [email or web address] Date: 04/28/2022 Prepared by: [facility]  Exercises - Heel Toe Raises with Counter Support  - 1 x daily - 5 x weekly - 2 sets - 10 reps - Sit to Stand with Counter Support  - 1 x daily - 5 x weekly - 2 sets - 10 reps - Side Stepping with Counter Support  - 1 x daily - 5 x weekly - 2 sets - 10 reps - Standing Gastroc Stretch at Counter  - 1 x daily - 5 x weekly - 2 sets - 30 sec hold    GOALS: Goals reviewed with patient? Yes  SHORT TERM GOALS: Target date: 05/19/2022  Patient to be independent with initial HEP. Baseline: HEP initiated Goal status: IN PROGRESS    LONG TERM GOALS: Target date: 06/09/2022  Patient to be independent with advanced HEP. Baseline: Not yet initiated  Goal status: IN PROGRESS  Patient to demonstrate B LE strength >/=4+/5.  Baseline: See above Goal status: IN PROGRESS  Patient to demonstrate at least 10 deg L ankle dorsiflexion AROM.Marland Kitchen  Baseline: 5 deg Goal status: IN PROGRESS  Patient to demonstrate alternating reciprocal pattern when ascending and descending stairs with good stability and 1 handrail as needed.   Baseline: NT Goal status: IN PROGRESS  Patient to complete TUG in <14 sec with LRAD in order to decrease risk of falls.   Baseline: 27.77 sec with RW Goal status: IN PROGRESS  Patient to demonstrate 5xSTS test in <15 sec in order to decrease risk of falls.  Baseline: 17.36 sec with B UE support from 21 inch seat height Goal status: IN PROGRESS  Patient to score at least 45/56 on Berg in order to decrease risk of falls.  Baseline: NT Goal status: IN PROGRESS   ASSESSMENT:  CLINICAL IMPRESSION:    Patient arrived to session with report of improvement in LBP since last session. Patient performed standing balance and hip strengthening activities with frequent sitting rest breaks required d/t LBP  and fatigue.  Worked on seated core strengthening to allow resolution of back pain and work on core stability. Patient performed STS transfers with 1 UE support with limited eccentric control. No complaints at end of session. No complaints at end of session.     OBJECTIVE IMPAIRMENTS Abnormal gait, decreased balance, decreased coordination, decreased endurance, decreased mobility, difficulty walking, decreased ROM, decreased strength, decreased safety awareness, impaired flexibility, improper body mechanics, postural dysfunction, and pain.   ACTIVITY LIMITATIONS carrying, lifting, bending, sitting, standing, squatting, stairs, transfers, bed mobility, bathing, toileting, dressing, reach over head, hygiene/grooming, and locomotion level  PARTICIPATION LIMITATIONS: meal prep, cleaning, laundry, shopping, community activity, and church  PERSONAL FACTORS Age, Fitness, Past/current experiences, Time since onset of injury/illness/exacerbation, and 3+ comorbidities: Chronic Myelocytic leukemia, prior MCA CVA (no residual deficits) and TIA, peripheral arterial disease, GERD, HTN, DMII, R TSA 2021  are also affecting patient's functional outcome.   REHAB POTENTIAL: Good  CLINICAL DECISION MAKING: Evolving/moderate complexity  EVALUATION COMPLEXITY: Moderate  PLAN: PT FREQUENCY: 2x/week  PT DURATION: 6 weeks  PLANNED INTERVENTIONS: Therapeutic exercises, Therapeutic activity, Neuromuscular re-education, Balance training, Gait training, Patient/Family education, Joint mobilization, Stair training, Vestibular training, Canalith repositioning, DME instructions, Aquatic Therapy, Dry Needling, Electrical stimulation, Cryotherapy, Moist heat, Taping, Manual therapy, and Re-evaluation  PLAN FOR NEXT SESSION:  stair navigation, progress LE strength and balance    Janene Harvey, PT, DPT 05/19/22 4:44 PM  [facility] at [facility] [address], [place], [place]  [address] Phone # [phone] Fax # [phone]

## 2022-05-19 ENCOUNTER — Ambulatory Visit: Payer: Medicare HMO | Admitting: Occupational Therapy

## 2022-05-19 ENCOUNTER — Encounter: Payer: Self-pay | Admitting: Physical Therapy

## 2022-05-19 ENCOUNTER — Ambulatory Visit: Payer: Medicare HMO | Admitting: Physical Therapy

## 2022-05-19 DIAGNOSIS — I69354 Hemiplegia and hemiparesis following cerebral infarction affecting left non-dominant side: Secondary | ICD-10-CM

## 2022-05-19 DIAGNOSIS — M6281 Muscle weakness (generalized): Secondary | ICD-10-CM

## 2022-05-19 DIAGNOSIS — R262 Difficulty in walking, not elsewhere classified: Secondary | ICD-10-CM

## 2022-05-19 DIAGNOSIS — R471 Dysarthria and anarthria: Secondary | ICD-10-CM | POA: Diagnosis not present

## 2022-05-19 DIAGNOSIS — R2681 Unsteadiness on feet: Secondary | ICD-10-CM

## 2022-05-19 DIAGNOSIS — R278 Other lack of coordination: Secondary | ICD-10-CM

## 2022-05-19 NOTE — Therapy (Unsigned)
OUTPATIENT OCCUPATIONAL THERAPY TREATMENT NOTE  Patient Name: Pamela Cox MRN: [id number] DOB:February 01, 1941, 81 y.o., female Today's Date: 05/20/2022  PCP: [facility] REFERRING PROVIDER: Kalman Drape, DO    OT End of Session - 05/19/22 1336     Visit Number 4    Number of Visits 17    Date for OT Re-Evaluation 06/25/22    Authorization Type [employer]    OT Start Time 1318    OT Stop Time 1400    OT Time Calculation (min) 42 min    Activity Tolerance Patient tolerated treatment well    Behavior During Therapy WFL for tasks assessed/performed              Past Medical History:  Diagnosis Date   Arthritis    CML (chronic myelocytic leukemia) ([place])    CVA (cerebral vascular accident) ([place])    Diabetes mellitus without complication ([place])    GERD (gastroesophageal reflux disease)    Hypertension    Myelocytic leukemia, chronic ([place])    Obesity    Sleep apnea    Past Surgical History:  Procedure Laterality Date   APPENDECTOMY     COLONOSCOPY     COLONOSCOPY WITH PROPOFOL N/A 07/16/2019   Procedure: COLONOSCOPY WITH PROPOFOL;  Surgeon: Lollie Sails, MD;  Location: [facility] ENDOSCOPY;  Service: Endoscopy;  Laterality: N/A;   ESOPHAGOGASTRODUODENOSCOPY (EGD) WITH PROPOFOL N/A 07/16/2019   Procedure: ESOPHAGOGASTRODUODENOSCOPY (EGD) WITH PROPOFOL;  Surgeon: Lollie Sails, MD;  Location: [facility] ENDOSCOPY;  Service: Endoscopy;  Laterality: N/A;   TOTAL SHOULDER REPLACEMENT Right 2021   TUBAL LIGATION     Patient Active Problem List   Diagnosis Date Noted   Allergic rhinitis due to pollen 08/23/2018   DDD (degenerative disc disease), lumbar 08/23/2018   Lumbar neuritis 08/23/2018   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   GERD (gastroesophageal reflux disease) 08/23/2018   Morbid obesity ([place]) 08/23/2018   Obstructive sleep apnea 08/23/2018   Intervertebral disc disorder with radiculopathy of lumbar region 08/23/2018   CML (chronic myelocytic  leukemia) ([place]) 08/23/2018   Hx of myocardial perfusion scan 08/23/2018   Osteoarthritis 08/23/2018   Degenerative arthritis of hip 08/23/2018   Lumbar spondylosis 08/23/2018   SOB (shortness of breath) 08/23/2018   History of CVA (cerebrovascular accident) 08/23/2018   Essential hypertension 08/23/2018   Chronic right shoulder pain 08/23/2018   Complete tear of right rotator cuff 08/23/2018   Acute pain of left shoulder 08/23/2018   On antineoplastic chemotherapy 08/23/2018   Hyperlipidemia 08/23/2018   Neurogenic claudication due to lumbar spinal stenosis 08/23/2018   Vitamin D deficiency 08/23/2018    ONSET DATE: 04/12/22  REFERRING DIAG: I63.81 (ICD-10-CM) - Other cerebral infarction due to occlusion or stenosis of small artery   THERAPY DIAG:  Hemiplegia and hemiparesis following cerebral infarction affecting left non-dominant side (HCC)  Muscle weakness (generalized)  Other lack of coordination  Rationale for Evaluation and Treatment Rehabilitation  SUBJECTIVE:   SUBJECTIVE STATEMENT: "I forgot to do my putty, I left it with [name] (speech therapist)." Pt accompanied by: self  PAIN: Are you having pain? Yes: NPRS scale: 5/10 Pain location: lower back Pain description: radiating Aggravating factors: lying down Relieving factors: putting a pillow under legs  PERTINENT HISTORY: R lacunar infarct w/ L-sided weakness; PMH includes CML, prior CVA (no residual deficits) and TIA, peripheral arterial disease, GERD, and HTN  PRECAUTIONS: Fall  PATIENT GOALS: to have more strength and walk better  OBJECTIVE:   HAND FUNCTION: Grip  strength: Right: 29 lbs; Left: 24 lbs   TODAY'S TREATMENT - 05/20/22: Theraputty with focus on coordination and strengthening with yellow putty.  Removing marbles from putty and rolling in to small balls.  Pt demonstrating improvements with coordination when rotating putty into small balls with L hand.   Coordination: picking up variety of  small items with L hand with focus on picking up instead of sliding across table.  Pt demonstrating good use of tip to tip and 3 jaw chuck pinch this session.  Increased challenge to in-hand manipulation and translation with coins and then placing them into coin slot.  Pt demonstrating good motor control and coordination during translation.   Coordination: card flipping and sorting.  Therapist providing mod fading to min cues for sequencing of card sorting task.  Pt demonstrating decreased organization of task, but overall good motor control and completion of task.    PATIENT EDUCATION: Educated on theraputty and coordination HEP and encouraged use of LUE during functional and structured tasks. Person educated: Patient Education method: Explanation Education comprehension: verbalized understanding   HOME EXERCISE PROGRAM: [id number] and coordination HEP - see pt instructions   GOALS: Goals reviewed with patient? Yes  SHORT TERM GOALS: Target date: 05/28/22   STG  Status:  1 Pt will be independent with HEP for [facility] to increase coordination as needed for ADLs and IADLs. Baseline:  Progressing  2 Pt will verbalize understanding of adapted strategies to maximize safety and I with ADLs/ IADLs . Baseline:  Progressing  3 Pt will increase 9 hole peg test by 8 seconds with LUE to demonstrate increased coordination at needed to complete ADLs and IADLs.  Baseline: Right: 35.41 sec; Left: 56.37 sec Progressing  4 Pt will demonstrate ability to retrieve a lightweight object at moderate range to demonstrate increased LUE strength and ROM as need to complete ADLs and IADLs. Baseline:  Progressing    LONG TERM GOALS: Target date: 06/25/22   LTG  Status:  1 Pt will be able to utilize LUE at non-dominant level to engage in self-care tasks without assistance. Baseline:  Progressing  2 Pt will demonstrate improved UE functional use for ADLs as evidenced by increasing box/ blocks score by 5 blocks with  LUE Baseline: Right 42 blocks, Left 33 blocks Progressing  3 Pt will increase 9 hole peg test by 15 seconds with LUE to demonstrate increased coordination at needed to complete ADLs and IADLs.  Baseline: Right: 35.41 sec; Left: 56.37 sec Progressing  4 Pt will demonstrate/report improvements in functional mobility and ADLs as reported on discharge functional status on FOTO by scoring >/= to 69. Baseline: 50 Progressing  5 Pt will be able to engage in IADLs with improved safety awareness, body positioning, and use of LUE as non-dominant level. Baseline:  Progressing    ASSESSMENT:  CLINICAL IMPRESSION: Treatment session with focus on pinch strength and coordination with putty and small items.  Pt demonstrating improved awareness of and improved technique with pinch this session during more functional tasks of pinch and rolling of putty and picking up variety of small items.  Pt demonstrating decreased sequencing and organization during card sorting task, however was ultimately able to complete with increased time.  PERFORMANCE DEFICITS in functional skills including ADLs, IADLs, coordination, dexterity, ROM, strength, pain, flexibility, FMC, GMC, mobility, balance, endurance, decreased knowledge of use of DME, and UE functional use.  IMPAIRMENTS are limiting patient from ADLs and IADLs.   COMORBIDITIES may have co-morbidities  that affects occupational performance.  Patient will benefit from skilled OT to address above impairments and improve overall function.   PLAN: OT FREQUENCY: 2x/week  OT DURATION: 8 weeks  PLANNED INTERVENTIONS: self care/ADL training, therapeutic exercise, therapeutic activity, neuromuscular re-education, manual therapy, passive range of motion, balance training, functional mobility training, electrical stimulation, ultrasound, moist heat, cryotherapy, patient/family education, visual/perceptual remediation/compensation, energy conservation, and DME and/or AE  instructions  RECOMMENDED OTHER SERVICES: NA  CONSULTED AND AGREED WITH PLAN OF CARE: Patient  PLAN FOR NEXT SESSION: Continue NMR of LUE [place], [place], and strength; begin [place] with ball toss in single hand and with both hands for symmetry.   Najeeb Uptain, Lead, OTR/L 05/20/2022, 8:16 AM

## 2022-05-19 NOTE — Patient Instructions (Addendum)
  Coordination Activities  Perform the following activities for 15 minutes 1 times per day with left hand(s).  Flip cards 1 at a time as fast as you can. Rotate card in hand (clockwise and counter-clockwise). Twirl pen between fingers.  Pick up variety of small items (coins, buttons, marbles, dried beans/pasta) of different sizes and place in container. Pick up coins and stack. Then unstack one at a time. Pick up coins one at a time until you get 5-10 in your hand, then move coins from palm to fingertips to stack one at a time.  Toss ball between hands. Toss ball in air and catch with the same hand.

## 2022-05-20 ENCOUNTER — Encounter: Payer: Medicare HMO | Admitting: Occupational Therapy

## 2022-05-20 ENCOUNTER — Ambulatory Visit: Payer: Medicare HMO

## 2022-05-21 ENCOUNTER — Ambulatory Visit: Payer: Medicare HMO

## 2022-05-21 DIAGNOSIS — R471 Dysarthria and anarthria: Secondary | ICD-10-CM | POA: Diagnosis not present

## 2022-05-21 DIAGNOSIS — R41841 Cognitive communication deficit: Secondary | ICD-10-CM

## 2022-05-21 DIAGNOSIS — R482 Apraxia: Secondary | ICD-10-CM

## 2022-05-21 NOTE — Therapy (Signed)
OUTPATIENT SPEECH LANGUAGE PATHOLOGY TREATMENT NOTE   Patient Name: Pamela Cox MRN: [id number] DOB:July 03, 1941, 81 y.o., female Today's Date: 05/21/2022  PCP: Loann Quill, MD REFERRING PROVIDER: Jerold Coombe, DO  END OF SESSION:   End of Session - 05/21/22 0949     Visit Number 6    Number of Visits 23    Date for SLP Re-Evaluation 07/22/22    SLP Start Time 0935    SLP Stop Time  3845    SLP Time Calculation (min) 40 min    Activity Tolerance Patient tolerated treatment well              Past Medical History:  Diagnosis Date   Arthritis    CML (chronic myelocytic leukemia) ([place])    CVA (cerebral vascular accident) ([place])    Diabetes mellitus without complication ([place])    GERD (gastroesophageal reflux disease)    Hypertension    Myelocytic leukemia, chronic ([place])    Obesity    Sleep apnea    Past Surgical History:  Procedure Laterality Date   APPENDECTOMY     COLONOSCOPY     COLONOSCOPY WITH PROPOFOL N/A 07/16/2019   Procedure: COLONOSCOPY WITH PROPOFOL;  Surgeon: Lollie Sails, MD;  Location: [facility] ENDOSCOPY;  Service: Endoscopy;  Laterality: N/A;   ESOPHAGOGASTRODUODENOSCOPY (EGD) WITH PROPOFOL N/A 07/16/2019   Procedure: ESOPHAGOGASTRODUODENOSCOPY (EGD) WITH PROPOFOL;  Surgeon: Lollie Sails, MD;  Location: [facility] ENDOSCOPY;  Service: Endoscopy;  Laterality: N/A;   TOTAL SHOULDER REPLACEMENT Right 2021   TUBAL LIGATION     Patient Active Problem List   Diagnosis Date Noted   Allergic rhinitis due to pollen 08/23/2018   DDD (degenerative disc disease), lumbar 08/23/2018   Lumbar neuritis 08/23/2018   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   GERD (gastroesophageal reflux disease) 08/23/2018   Morbid obesity ([place]) 08/23/2018   Obstructive sleep apnea 08/23/2018   Intervertebral disc disorder with radiculopathy of lumbar region 08/23/2018   CML (chronic myelocytic leukemia) ([place]) 08/23/2018   Hx of myocardial perfusion scan 08/23/2018    Osteoarthritis 08/23/2018   Degenerative arthritis of hip 08/23/2018   Lumbar spondylosis 08/23/2018   SOB (shortness of breath) 08/23/2018   History of CVA (cerebrovascular accident) 08/23/2018   Essential hypertension 08/23/2018   Chronic right shoulder pain 08/23/2018   Complete tear of right rotator cuff 08/23/2018   Acute pain of left shoulder 08/23/2018   On antineoplastic chemotherapy 08/23/2018   Hyperlipidemia 08/23/2018   Neurogenic claudication due to lumbar spinal stenosis 08/23/2018   Vitamin D deficiency 08/23/2018    ONSET DATE: 04/13/22  REFERRING DIAG: Lacunar infarct, acute  THERAPY DIAG:  Dysarthria and anarthria  Verbal apraxia  Cognitive communication deficit  Rationale for Evaluation and Treatment Rehabilitation  SUBJECTIVE: SLP understood 95% of pt's speech the first 5 utterances upon entering [place] room.  PAIN:  Are you having pain? No    OBJECTIVE:  TODAY'S TREATMENT:  05/21/22: Today, SLP targetred slowing of pt's speech in conversation. SLP used 2-5 minute conversations to  incr pt's usage of speech compensatory strategies - pt used compensations 70% of the time for overall intelligibility 95%. She slowed her rate when speaking quickly 90% of the time, sometimes mid-sentence. She cont to perform HEP at home for oral muscle strength. Amirah reports her medicines are taken correctly, no skips or doubles since return from latest ED visit. "I just ask Lavonne if I need to but I don't really need to," pt stated.  05/17/22: SLP educated pt  on lingual manipulation and labial coordination (straw movement bilaterally) and labial strength (pressing with cheeks full of air). Pt initially req'd mod usual cues faded to modified independent. Established HEP section then reviewed and pt req'd occasional min A. SLP targeted pt's speech intelligibility in sentence responses with structured ST tasks. Pt's responses were slightly abnormal in prosody however had good  intelligibility. Intermittent slowing of pt's speech in conversation. Educated dtr on pt's homework when saw her in lobby, alone.   05/14/22: SLP provided pt with HEP for oral motor strength today due to labial droop and numbness on pt's lt face. She req'd usual min-mod A and was provided a handout to take home. Rarely today, pt spontaneously slowed speech and overarticulated in conversation stating she was trying to incr intelligibility. Overall intelligibility approx 85%. When asked to repeat pt used these two strategies and improved intelligibility with these instances to 100%. SLP modified STG for conversation using compensations. Pt filled out her PROM - communication effectiveness, and scored 24/32.  05/12/22: Pt was seen for skilled ST services with focus on cognition and dysarthria. Pt was admitted to hospital on 05/04/22. Pt reports speech is "the same if not better" when using her strategies (she was able to recall all strategies). SLP screened cognition today due to acute change. SLP administered SLUMS - pt scored a 9/30 indicating deficits in memory, attention, cognitive flexibility, and executive functioning skills. See below:   SLU Mental Status (SLUMS Examination)  Orientation: 3/3 Delayed Recall w/ Interference: 2/5 Numeric Calculation and Registration: 0/3 Immediate Recall w/ Interference (Generative naming): 2/3 Registration and Digit Span: 0/2 Visual Spatial/Exec Functioning: 2/6 Executive Functioning/Extrapolation:  0/8  Pt reported her drooling is the same, but also reports anterior spillage from R side. SLP suggested pt use straw to assist in decreasing labial spillage. SLP suggest adding cognitive goals for patient with focus on memory. Cont with current POC.       PATIENT EDUCATION: Education details: HEP procedure, rationale Person educated: Patient  Education method: Explanation, Demonstration, Verbal cues, and Handout Education comprehension: verbalized understanding,  returned demonstration, verbal cues required, and needs further education         GOALS: Goals reviewed with patient? Yes   SHORT TERM GOALS: Target date: 05/28/2022     Pt will perform HEP for dysarthria compensation (overarticulation) with rare min A in 3 sessions Baseline: Goal status: Ongoing   2.  Pt will perform HEP for dysarthria (oral motor strength) with rare min A in 3 sessions Baseline:  Goal status: Ongoing   3.  Pt will engage in 3 minutes simple conversation with 90%+ intelligibility in 2 sessions Baseline:  Goal status: Revised   4.  Pt will complete speech PROM in first two therapy sessions Baseline:  Goal status: Met   5.  Pt will develop memory compensation/s to assist with home tasks and IADLs (e.g., speech HEP, to-do or grocery list, med administration, etc) Baseline:  Goal status: Ongoing   LONG TERM GOALS: Target date: 07/22/2022     Pt will perform HEP for dysarthria compensation (overarticulation) with modified independence or SBA in 3 sessions Baseline:  Goal status: Ongoing   2.  Pt will perform HEP for dysarthria (oral motor strength) with modified independence or SBA in 3 sessions Baseline:  Goal status: Ongoing   3.  Pt will engage in 15 minutes simple-mod complex conversation with 95%+ intelligibility with compensations in 3 sessions Baseline:  Goal status: Ongoing   4.  Pt will improve  score on speech PROM taken in her last two therapy sessions Baseline:  Goal status: Ongoing  5.   Pt will report use of/use memory compensation/s to assist with home tasks and IADLs in 5 sessions Baseline:  Goal status: Ongoing   ASSESSMENT:   CLINICAL IMPRESSION: Patient is a 81 y.o. female with diagnosis of dysarthria. See tx note. Pt spontaneously used compensations for speech clarity in conversation more frequently today. 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    OBJECTIVE  IMPAIRMENTS include memory, apraxia, and dysarthria. These impairments are limiting patient from household responsibilities and effectively communicating at home and in community. Factors affecting potential to achieve goals and functional outcome are previous level of function.. Patient will benefit from skilled SLP services to address above impairments and improve overall function.   REHAB POTENTIAL: Good   PLAN: SLP FREQUENCY: 2x/week   SLP DURATION: 12 weeks   PLANNED INTERVENTIONS: Environmental controls, Cueing hierachy, Cognitive reorganization, Internal/external aids, Oral motor exercises, Functional tasks, Multimodal communication approach, SLP instruction and feedback, Compensatory strategies, and Patient/family education       [facility], [place] 05/21/2022, 9:49 AM

## 2022-05-24 ENCOUNTER — Ambulatory Visit: Payer: Medicare HMO | Admitting: Physical Therapy

## 2022-05-24 ENCOUNTER — Ambulatory Visit: Payer: Medicare HMO | Attending: Neurology

## 2022-05-24 DIAGNOSIS — I69354 Hemiplegia and hemiparesis following cerebral infarction affecting left non-dominant side: Secondary | ICD-10-CM | POA: Insufficient documentation

## 2022-05-24 DIAGNOSIS — R262 Difficulty in walking, not elsewhere classified: Secondary | ICD-10-CM | POA: Insufficient documentation

## 2022-05-24 DIAGNOSIS — M5442 Lumbago with sciatica, left side: Secondary | ICD-10-CM | POA: Insufficient documentation

## 2022-05-24 DIAGNOSIS — M6281 Muscle weakness (generalized): Secondary | ICD-10-CM | POA: Insufficient documentation

## 2022-05-24 DIAGNOSIS — R471 Dysarthria and anarthria: Secondary | ICD-10-CM | POA: Insufficient documentation

## 2022-05-24 DIAGNOSIS — R2681 Unsteadiness on feet: Secondary | ICD-10-CM | POA: Insufficient documentation

## 2022-05-24 DIAGNOSIS — R278 Other lack of coordination: Secondary | ICD-10-CM | POA: Insufficient documentation

## 2022-05-24 DIAGNOSIS — G8929 Other chronic pain: Secondary | ICD-10-CM | POA: Insufficient documentation

## 2022-05-24 DIAGNOSIS — R41841 Cognitive communication deficit: Secondary | ICD-10-CM | POA: Insufficient documentation

## 2022-05-24 DIAGNOSIS — M5441 Lumbago with sciatica, right side: Secondary | ICD-10-CM | POA: Insufficient documentation

## 2022-05-26 ENCOUNTER — Encounter: Payer: Self-pay | Admitting: Occupational Therapy

## 2022-05-26 ENCOUNTER — Ambulatory Visit: Payer: Medicare HMO | Admitting: Occupational Therapy

## 2022-05-26 ENCOUNTER — Ambulatory Visit: Payer: Medicare HMO

## 2022-05-26 DIAGNOSIS — R471 Dysarthria and anarthria: Secondary | ICD-10-CM | POA: Diagnosis present

## 2022-05-26 DIAGNOSIS — R41841 Cognitive communication deficit: Secondary | ICD-10-CM | POA: Diagnosis present

## 2022-05-26 DIAGNOSIS — R278 Other lack of coordination: Secondary | ICD-10-CM

## 2022-05-26 DIAGNOSIS — M6281 Muscle weakness (generalized): Secondary | ICD-10-CM | POA: Diagnosis present

## 2022-05-26 DIAGNOSIS — R262 Difficulty in walking, not elsewhere classified: Secondary | ICD-10-CM | POA: Diagnosis present

## 2022-05-26 DIAGNOSIS — R2681 Unsteadiness on feet: Secondary | ICD-10-CM

## 2022-05-26 DIAGNOSIS — G8929 Other chronic pain: Secondary | ICD-10-CM | POA: Diagnosis present

## 2022-05-26 DIAGNOSIS — I69354 Hemiplegia and hemiparesis following cerebral infarction affecting left non-dominant side: Secondary | ICD-10-CM | POA: Diagnosis present

## 2022-05-26 DIAGNOSIS — M5442 Lumbago with sciatica, left side: Secondary | ICD-10-CM | POA: Diagnosis present

## 2022-05-26 DIAGNOSIS — M5441 Lumbago with sciatica, right side: Secondary | ICD-10-CM | POA: Diagnosis present

## 2022-05-26 NOTE — Therapy (Signed)
OUTPATIENT OCCUPATIONAL THERAPY TREATMENT NOTE  Patient Name: Pamela Cox MRN: [id number] DOB:1940/12/03, 81 y.o., female Today's Date: 05/26/2022  PCP: [facility] REFERRING PROVIDER: Kalman Drape, DO    OT End of Session - 05/26/22 1449     Visit Number 5    Number of Visits 17    Date for OT Re-Evaluation 06/25/22    Authorization Type [employer] Medicare    OT Start Time 1448    OT Stop Time 1530    OT Time Calculation (min) 42 min    Activity Tolerance Patient tolerated treatment well    Behavior During Therapy WFL for tasks assessed/performed            Past Medical History:  Diagnosis Date   Arthritis    CML (chronic myelocytic leukemia) ([place])    CVA (cerebral vascular accident) ([place])    Diabetes mellitus without complication ([place])    GERD (gastroesophageal reflux disease)    Hypertension    Myelocytic leukemia, chronic ([place])    Obesity    Sleep apnea    Past Surgical History:  Procedure Laterality Date   APPENDECTOMY     COLONOSCOPY     COLONOSCOPY WITH PROPOFOL N/A 07/16/2019   Procedure: COLONOSCOPY WITH PROPOFOL;  Surgeon: Lollie Sails, MD;  Location: [facility] ENDOSCOPY;  Service: Endoscopy;  Laterality: N/A;   ESOPHAGOGASTRODUODENOSCOPY (EGD) WITH PROPOFOL N/A 07/16/2019   Procedure: ESOPHAGOGASTRODUODENOSCOPY (EGD) WITH PROPOFOL;  Surgeon: Lollie Sails, MD;  Location: [facility] ENDOSCOPY;  Service: Endoscopy;  Laterality: N/A;   TOTAL SHOULDER REPLACEMENT Right 2021   TUBAL LIGATION     Patient Active Problem List   Diagnosis Date Noted   Allergic rhinitis due to pollen 08/23/2018   DDD (degenerative disc disease), lumbar 08/23/2018   Lumbar neuritis 08/23/2018   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   GERD (gastroesophageal reflux disease) 08/23/2018   Morbid obesity ([place]) 08/23/2018   Obstructive sleep apnea 08/23/2018   Intervertebral disc disorder with radiculopathy of lumbar region 08/23/2018   CML (chronic myelocytic leukemia)  ([place]) 08/23/2018   Hx of myocardial perfusion scan 08/23/2018   Osteoarthritis 08/23/2018   Degenerative arthritis of hip 08/23/2018   Lumbar spondylosis 08/23/2018   SOB (shortness of breath) 08/23/2018   History of CVA (cerebrovascular accident) 08/23/2018   Essential hypertension 08/23/2018   Chronic right shoulder pain 08/23/2018   Complete tear of right rotator cuff 08/23/2018   Acute pain of left shoulder 08/23/2018   On antineoplastic chemotherapy 08/23/2018   Hyperlipidemia 08/23/2018   Neurogenic claudication due to lumbar spinal stenosis 08/23/2018   Vitamin D deficiency 08/23/2018    ONSET DATE: 04/12/22  REFERRING DIAG: I63.81 (ICD-10-CM) - Other cerebral infarction due to occlusion or stenosis of small artery   THERAPY DIAG:  Hemiplegia and hemiparesis following cerebral infarction affecting left non-dominant side (HCC)  Muscle weakness (generalized)  Other lack of coordination  Rationale for Evaluation and Treatment Rehabilitation  SUBJECTIVE:   SUBJECTIVE STATEMENT: Pt reports she was thinking about stopping OP therapies because she does not feel like she is "better," but her family encouraged her to continue Pt accompanied by: self  PAIN: Are you having pain? No  PERTINENT HISTORY: R lacunar infarct w/ L-sided weakness; PMH includes CML, prior CVA (no residual deficits) and TIA, peripheral arterial disease, GERD, and HTN  PRECAUTIONS: Fall  PATIENT GOALS: to have more strength and walk better  OBJECTIVE:   TODAY'S TREATMENT - 05/26/22: Picking up rectangular blocks w/ L hand and placing in  a stack with min cues for typical movement patterns and to line up blocks correctly; pt demonstrated good [place] throughout [place]: Provided  encouraged use of LUE during functional and structured tasks. Person educated: Patient Education method: Explanation Education comprehension: verbalized understanding   HOME EXERCISE  PROGRAM: MedBridge Code: [email or web address] Coordination HEP - see pt instructions   GOALS: Goals reviewed with patient? Yes  SHORT TERM GOALS: Target date: 05/28/22   STG  Status:  1 Pt will be independent with HEP for [facility] to increase coordination as needed for ADLs and IADLs. Baseline:  Progressing  2 Pt will verbalize understanding of adapted strategies to maximize safety and I with ADLs/ IADLs . Baseline:  Progressing  3 Pt will increase 9 hole peg test by 8 seconds with LUE to demonstrate increased coordination at needed to complete ADLs and IADLs.  Baseline: Right: 35.41 sec; Left: 56.37 sec Met - 05/26/33: 39.47 sec w/ LUE  4 Pt will demonstrate ability to retrieve a lightweight object at moderate range to demonstrate increased LUE strength and ROM as need to complete ADLs and IADLs. Baseline: Grip strength: Right: 29 lbs; Left: 24 lbs Progressing    LONG TERM GOALS: Target date: 06/25/22   LTG  Status:  1 Pt will be able to utilize LUE at non-dominant level to engage in self-care tasks without assistance. Baseline:  Progressing  2 Pt will demonstrate improved UE functional use for ADLs as evidenced by increasing box/ blocks score by 5 blocks with LUE Baseline: Right 42 blocks, Left 33 blocks Progressing - 05/26/22: 35 blocks w/ LUE  3 Pt will increase 9 hole peg test by 15 seconds with LUE to demonstrate increased coordination at needed to complete ADLs and IADLs.  Baseline: Right: 35.41 sec; Left: 56.37 sec Met - 05/26/22: 39.47 sec w/ LUE  4 Pt will demonstrate/report improvements in functional mobility and ADLs as reported on discharge functional status on FOTO by scoring >/= to 69. Baseline: 19 Progressing  5 Pt will be able to engage in IADLs with improved safety awareness, body positioning, and use of LUE as non-dominant level. Baseline:  Progressing    ASSESSMENT:  CLINICAL IMPRESSION: *** Reassessed Box and Blocks and 9-HPT  Treatment session with focus on pinch strength and  coordination with putty and small items.  Pt demonstrating improved awareness of and improved technique with pinch this session during more functional tasks of pinch and rolling of putty and picking up variety of small items.  Pt demonstrating decreased sequencing and organization during card sorting task, however was ultimately able to complete with increased time.  PERFORMANCE DEFICITS in functional skills including ADLs, IADLs, coordination, dexterity, ROM, strength, pain, flexibility, FMC, GMC, mobility, balance, endurance, decreased knowledge of use of DME, and UE functional use.  IMPAIRMENTS are limiting patient from ADLs and IADLs.   COMORBIDITIES may have co-morbidities  that affects occupational performance. Patient will benefit from skilled OT to address above impairments and improve overall function.   PLAN: OT FREQUENCY: 2x/week  OT DURATION: 8 weeks  PLANNED INTERVENTIONS: self care/ADL training, therapeutic exercise, therapeutic activity, neuromuscular re-education, manual therapy, passive range of motion, balance training, functional mobility training, electrical stimulation, ultrasound, moist heat, cryotherapy, patient/family education, visual/perceptual remediation/compensation, energy conservation, and DME and/or AE instructions  RECOMMENDED OTHER SERVICES: NA  CONSULTED AND AGREED WITH PLAN OF CARE: Patient  PLAN FOR NEXT SESSION: Continue NMR of LUE [place], [place], and strength; begin [place] with ball toss in single hand  and with both hands for symmetry.   Kathrine Cords, OTR/L 05/26/2022, 2:53 PM

## 2022-05-26 NOTE — Therapy (Signed)
OUTPATIENT PHYSICAL THERAPY NEURO TREATMENT   Patient Name: Pamela Cox MRN: [id number] DOB:Feb 26, 1941, 81 y.o., female Today's Date: 05/26/2022   PCP: [facility] REFERRING PROVIDER: Kalman Drape, DO    PT End of Session - 05/26/22 1406     Visit Number 6    Number of Visits 13    Date for PT Re-Evaluation 06/09/22    Authorization Type A[employer]na Medicare    PT Start Time 1400    PT Stop Time 1445    PT Time Calculation (min) 45 min    Activity Tolerance Patient tolerated treatment well    Behavior During Therapy WFL for tasks assessed/performed               Past Medical History:  Diagnosis Date   Arthritis    CML (chronic myelocytic leukemia) ([place])    CVA (cerebral vascular accident) ([place])    Diabetes mellitus without complication ([place])    GERD (gastroesophageal reflux disease)    Hypertension    Myelocytic leukemia, chronic ([place])    Obesity    Sleep apnea    Past Surgical History:  Procedure Laterality Date   APPENDECTOMY     COLONOSCOPY     COLONOSCOPY WITH PROPOFOL N/A 07/16/2019   Procedure: COLONOSCOPY WITH PROPOFOL;  Surgeon: Lollie Sails, MD;  Location: [facility] ENDOSCOPY;  Service: Endoscopy;  Laterality: N/A;   ESOPHAGOGASTRODUODENOSCOPY (EGD) WITH PROPOFOL N/A 07/16/2019   Procedure: ESOPHAGOGASTRODUODENOSCOPY (EGD) WITH PROPOFOL;  Surgeon: Lollie Sails, MD;  Location: [facility] ENDOSCOPY;  Service: Endoscopy;  Laterality: N/A;   TOTAL SHOULDER REPLACEMENT Right 2021   TUBAL LIGATION     Patient Active Problem List   Diagnosis Date Noted   Allergic rhinitis due to pollen 08/23/2018   DDD (degenerative disc disease), lumbar 08/23/2018   Lumbar neuritis 08/23/2018   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   GERD (gastroesophageal reflux disease) 08/23/2018   Morbid obesity ([place]) 08/23/2018   Obstructive sleep apnea 08/23/2018   Intervertebral disc disorder with radiculopathy of lumbar region 08/23/2018   CML (chronic myelocytic  leukemia) ([place]) 08/23/2018   Hx of myocardial perfusion scan 08/23/2018   Osteoarthritis 08/23/2018   Degenerative arthritis of hip 08/23/2018   Lumbar spondylosis 08/23/2018   SOB (shortness of breath) 08/23/2018   History of CVA (cerebrovascular accident) 08/23/2018   Essential hypertension 08/23/2018   Chronic right shoulder pain 08/23/2018   Complete tear of right rotator cuff 08/23/2018   Acute pain of left shoulder 08/23/2018   On antineoplastic chemotherapy 08/23/2018   Hyperlipidemia 08/23/2018   Neurogenic claudication due to lumbar spinal stenosis 08/23/2018   Vitamin D deficiency 08/23/2018    ONSET DATE: 04/13/22  REFERRING DIAG: I63.81 (ICD-10-CM) - Other cerebral infarction due to occlusion or stenosis of small artery   THERAPY DIAG:  Muscle weakness (generalized)  Difficulty in walking, not elsewhere classified  Unsteadiness on feet  Rationale for Evaluation and Treatment Rehabilitation  SUBJECTIVE:  SUBJECTIVE STATEMENT: Low back is feeling better, just feeling tired all the time and still feeling weak. Feeling frustrated that I'm not strong yet Pt accompanied by: self  PERTINENT HISTORY: Chronic Myelocytic leukemia, prior MCA CVA (no residual deficits) and TIA, peripheral arterial disease, GERD, HTN, DMII, R TSA 2021  PAIN:  Are you having pain? Yes: NPRS scale: 4-5/10 Pain location: LBP Pain description: dull Aggravating factors: all movements Relieving factors: Gabapentin/moist heat  PRECAUTIONS: Fall  PATIENT GOALS work on strengthening and speech  OBJECTIVE:   TODAY'S TREATMENT: 05/26/22 Activity Comments  Seated BLE AROM 3x10 fr dyanmic warm-up AP, LAQ,   Standing hip abd 2x10 3#, cues for slow pace and brief isometric hold at end-range  Standing hip ext  2x10 3#, cues for slow pace and brief isometric hold at end-range  Sidestepping parallel bars x 2 min 3# ankle weights Able to last 1 min 50 sec before back/legs fatigue  Alt stair taps x 2 min 3# ankle weights, 6" step  Standing on foam: 3x15 sec EO, EC, EO head turns        HOME EXERCISE PROGRAM Last updated: 05/12/22  Access Code: [email or web address] URL: [email or web address] Date: 05/12/2022 Prepared by: [facility]  Exercises - Heel Toe Raises with Counter Support  - 1 x daily - 5 x weekly - 2 sets - 10 reps - Sit to Stand with Counter Support  - 1 x daily - 5 x weekly - 2 sets - 10 reps - Side Stepping with Counter Support  - 1 x daily - 5 x weekly - 2 sets - 10 reps - Standing Gastroc Stretch at Counter  - 1 x daily - 5 x weekly - 2 sets - 30 sec hold - Seated Flexion Stretch with Swiss Ball  - 1 x daily - 5 x weekly - 2 sets - 10 reps - 5 sec hold - Seated Thoracic Flexion and Rotation with Swiss Ball  - 1 x daily - 5 x weekly - 2 sets - 10 reps - 5 sec hold - Supine Lower Trunk Rotation  - 1 x daily - 5 x weekly - 2 sets - 10 reps - Seated Piriformis Stretch  - 1 x daily - 5 x weekly - 2 sets - 30 sec hold - Seated Figure 4 Piriformis Stretch  - 1 x daily - 5 x weekly - 2 sets - 30 sec hold  Below measures were taken at time of initial evaluation unless otherwise specified:  DIAGNOSTIC FINDINGS: MR without contrast (04/13/22) Acute, small lacunar infarct in posterior limb of the right internal capsule. Sequelae of remote left parietal lobe infarct.  CT Brain without contrast (04/13/22) Unchanged background of chronic microvascular ischemic white matter changes and scattered remote small vessel infarcts.  COGNITION: Overall cognitive status: No family/caregiver present to determine baseline cognitive functioning   SENSATION: WFL  COORDINATION: Alt pronation/supination slightly dysmetric on L; B heel to shin limited by  strength/ROM   MUSCLE TONE: normal in B LEs  POSTURE: rounded shoulders, forward head, increased thoracic kyphosis, and posterior pelvic tilt  LOWER EXTREMITY ROM:     Active  Right Eval Left Eval  Hip flexion    Hip extension    Hip abduction    Hip adduction    Hip internal rotation    Hip external rotation    Knee flexion    Knee extension    Ankle dorsiflexion 10 5  Ankle plantarflexion  Ankle inversion    Ankle eversion     (Blank rows = not tested)  LOWER EXTREMITY MMT:    MMT (in sitting) Right Eval Left Eval  Hip flexion 4+ 4+  Hip extension    Hip abduction 4 4-  Hip adduction 4 4  Hip internal rotation    Hip external rotation    Knee flexion 4 4  Knee extension 4+ 4  Ankle dorsiflexion 4 4-  Ankle plantarflexion 4 4  Ankle inversion    Ankle eversion    (Blank rows = not tested)   GAIT: Gait pattern: step to pattern, step through pattern, decreased step length- Right, decreased stance time- Left, and trunk flexed Assistive device utilized: Environmental consultant - 2 wheeled Level of assistance: Modified independence   FUNCTIONAL TESTs:  5 times sit to stand: 17.36 sec with B UE support from 21 inch seat height Timed up and go (TUG): 27/77 sec with RW; imbalance and trouble staying within confines of walker during turns [employer] Scale: 30/56 indicating high risk for falls  PATIENT SURVEYS:  FOTO [phone]    PATIENT EDUCATION: Education details: prognosis, POC, HEP Person educated: Patient Education method: Explanation, Demonstration, Tactile cues, Verbal cues, and Handouts Education comprehension: verbalized understanding   HOME EXERCISE PROGRAM: Access Code: [email or web address] URL: [email or web address] Date: 04/28/2022 Prepared by: [facility]  Exercises - Heel Toe Raises with Counter Support  - 1 x daily - 5 x weekly - 2 sets - 10 reps - Sit to Stand with Counter Support  - 1 x daily - 5 x weekly -  2 sets - 10 reps - Side Stepping with Counter Support  - 1 x daily - 5 x weekly - 2 sets - 10 reps - Standing Gastroc Stretch at Counter  - 1 x daily - 5 x weekly - 2 sets - 30 sec hold    GOALS: Goals reviewed with patient? Yes  SHORT TERM GOALS: Target date: 05/19/2022  Patient to be independent with initial HEP. Baseline: HEP initiated Goal status: IN PROGRESS    LONG TERM GOALS: Target date: 06/09/2022  Patient to be independent with advanced HEP. Baseline: Not yet initiated  Goal status: IN PROGRESS  Patient to demonstrate B LE strength >/=4+/5.  Baseline: See above Goal status: IN PROGRESS  Patient to demonstrate at least 10 deg L ankle dorsiflexion AROM.Marland Kitchen  Baseline: 5 deg Goal status: IN PROGRESS  Patient to demonstrate alternating reciprocal pattern when ascending and descending stairs with good stability and 1 handrail as needed.   Baseline: NT Goal status: IN PROGRESS  Patient to complete TUG in <14 sec with LRAD in order to decrease risk of falls.   Baseline: 27.77 sec with RW Goal status: IN PROGRESS  Patient to demonstrate 5xSTS test in <15 sec in order to decrease risk of falls.  Baseline: 17.36 sec with B UE support from 21 inch seat height Goal status: IN PROGRESS  Patient to score at least 45/56 on [place] in order to decrease risk of falls.  Baseline: NT Goal status: IN PROGRESS   ASSESSMENT:  CLINICAL IMPRESSION:  Pt notes feeling frustrated by her lack of energy and feels like she is not improving in strength.  Following observation of patient's performance of HEP and need for consistent cues for sequence and execution this reporter would question her compliance with home-based exercise routine.  Discussed importance and emphasis on consistency when it comes to exercise and to maximize benefits  requires intensity and extensity.  Frequent rest periods needed due to fatigue and LBP with radiating leg pain.  Progressed with activities using 3# ankle  weights to progress LE strength.  Discussed implementation of standing HEP at home when performing her usual chores and performed as circuit, e.g. washing dishes perform 10 reps of 3 LE exercises, 10 reps of 3 LE exercise when sitting watching TV, etc.  Continued sessions to progress strength, balance, and activity tolerance. Difficulty in standing on compliant surface requiring at least unilateral UE support.     OBJECTIVE IMPAIRMENTS Abnormal gait, decreased balance, decreased coordination, decreased endurance, decreased mobility, difficulty walking, decreased ROM, decreased strength, decreased safety awareness, impaired flexibility, improper body mechanics, postural dysfunction, and pain.   ACTIVITY LIMITATIONS carrying, lifting, bending, sitting, standing, squatting, stairs, transfers, bed mobility, bathing, toileting, dressing, reach over head, hygiene/grooming, and locomotion level  PARTICIPATION LIMITATIONS: meal prep, cleaning, laundry, shopping, community activity, and church  PERSONAL FACTORS Age, Fitness, Past/current experiences, Time since onset of injury/illness/exacerbation, and 3+ comorbidities: Chronic Myelocytic leukemia, prior MCA CVA (no residual deficits) and TIA, peripheral arterial disease, GERD, HTN, DMII, R TSA 2021  are also affecting patient's functional outcome.   REHAB POTENTIAL: Good  CLINICAL DECISION MAKING: Evolving/moderate complexity  EVALUATION COMPLEXITY: Moderate  PLAN: PT FREQUENCY: 2x/week  PT DURATION: 6 weeks  PLANNED INTERVENTIONS: Therapeutic exercises, Therapeutic activity, Neuromuscular re-education, Balance training, Gait training, Patient/Family education, Joint mobilization, Stair training, Vestibular training, Canalith repositioning, DME instructions, Aquatic Therapy, Dry Needling, Electrical stimulation, Cryotherapy, Moist heat, Taping, Manual therapy, and Re-evaluation  PLAN FOR NEXT SESSION:  stair navigation, progress LE strength and  balance    2:07 PM, 05/26/22 M. Sherlyn Lees, PT, DPT Physical Therapist- [place] Office Number: [phone]   [place] [facility] [address] [place], [place] [address] Phone # [phone] Fax # [phone]

## 2022-05-31 ENCOUNTER — Ambulatory Visit: Payer: Medicare HMO | Admitting: Occupational Therapy

## 2022-05-31 ENCOUNTER — Ambulatory Visit: Payer: Medicare HMO

## 2022-05-31 DIAGNOSIS — R278 Other lack of coordination: Secondary | ICD-10-CM

## 2022-05-31 DIAGNOSIS — M6281 Muscle weakness (generalized): Secondary | ICD-10-CM

## 2022-05-31 DIAGNOSIS — R2681 Unsteadiness on feet: Secondary | ICD-10-CM

## 2022-05-31 DIAGNOSIS — I69354 Hemiplegia and hemiparesis following cerebral infarction affecting left non-dominant side: Secondary | ICD-10-CM

## 2022-05-31 DIAGNOSIS — G8929 Other chronic pain: Secondary | ICD-10-CM

## 2022-05-31 DIAGNOSIS — R262 Difficulty in walking, not elsewhere classified: Secondary | ICD-10-CM

## 2022-05-31 NOTE — Therapy (Signed)
OUTPATIENT OCCUPATIONAL THERAPY TREATMENT NOTE  Patient Name: Reema Chick MRN: [id number] DOB:[birth year], 81 y.o., female Today's Date: 05/31/2022  PCP: [facility] REFERRING PROVIDER: Kalman Drape, DO    OT End of Session - 05/31/22 1453     Visit Number 6    Number of Visits 17    Date for OT Re-Evaluation 06/25/22    Authorization Type [employer] Medicare    OT Start Time 1447    OT Stop Time 1530    OT Time Calculation (min) 43 min    Activity Tolerance Patient tolerated treatment well    Behavior During Therapy WFL for tasks assessed/performed             Past Medical History:  Diagnosis Date   Arthritis    CML (chronic myelocytic leukemia) ([place])    CVA (cerebral vascular accident) ([place])    Diabetes mellitus without complication ([place])    GERD (gastroesophageal reflux disease)    Hypertension    Myelocytic leukemia, chronic ([place])    Obesity    Sleep apnea    Past Surgical History:  Procedure Laterality Date   APPENDECTOMY     COLONOSCOPY     COLONOSCOPY WITH PROPOFOL N/A 07/16/2019   Procedure: COLONOSCOPY WITH PROPOFOL;  Surgeon: Lollie Sails, MD;  Location: [facility] ENDOSCOPY;  Service: Endoscopy;  Laterality: N/A;   ESOPHAGOGASTRODUODENOSCOPY (EGD) WITH PROPOFOL N/A 07/16/2019   Procedure: ESOPHAGOGASTRODUODENOSCOPY (EGD) WITH PROPOFOL;  Surgeon: Lollie Sails, MD;  Location: [facility] ENDOSCOPY;  Service: Endoscopy;  Laterality: N/A;   TOTAL SHOULDER REPLACEMENT Right 2021   TUBAL LIGATION     Patient Active Problem List   Diagnosis Date Noted   Allergic rhinitis due to pollen 08/23/2018   DDD (degenerative disc disease), lumbar 08/23/2018   Lumbar neuritis 08/23/2018   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   GERD (gastroesophageal reflux disease) 08/23/2018   Morbid obesity ([place]) 08/23/2018   Obstructive sleep apnea 08/23/2018   Intervertebral disc disorder with radiculopathy of lumbar region 08/23/2018   CML (chronic myelocytic  leukemia) ([place]) 08/23/2018   Hx of myocardial perfusion scan 08/23/2018   Osteoarthritis 08/23/2018   Degenerative arthritis of hip 08/23/2018   Lumbar spondylosis 08/23/2018   SOB (shortness of breath) 08/23/2018   History of CVA (cerebrovascular accident) 08/23/2018   Essential hypertension 08/23/2018   Chronic right shoulder pain 08/23/2018   Complete tear of right rotator cuff 08/23/2018   Acute pain of left shoulder 08/23/2018   On antineoplastic chemotherapy 08/23/2018   Hyperlipidemia 08/23/2018   Neurogenic claudication due to lumbar spinal stenosis 08/23/2018   Vitamin D deficiency 08/23/2018    ONSET DATE: 04/12/22  REFERRING DIAG: I63.81 (ICD-10-CM) - Other cerebral infarction due to occlusion or stenosis of small artery   THERAPY DIAG:  Hemiplegia and hemiparesis following cerebral infarction affecting left non-dominant side (HCC)  Other lack of coordination  Muscle weakness (generalized)  Unsteadiness on feet  Rationale for Evaluation and Treatment Rehabilitation  SUBJECTIVE:   SUBJECTIVE STATEMENT: Pt reports feeling that she is doing a lot better with PT sessions and is motivated to see the progress. Pt accompanied by: self  PAIN: Are you having pain? No  PERTINENT HISTORY: R lacunar infarct w/ L-sided weakness; PMH includes CML, prior CVA (no residual deficits) and TIA, peripheral arterial disease, GERD, and HTN  PRECAUTIONS: Fall  PATIENT GOALS: to have more strength and walk better  OBJECTIVE:   TODAY'S TREATMENT - 05/31/22: LUE shoulder strengthening with seated shoulder flexion with dowel for  symmetry.  Pt completed 2 sets of 10, therapist noticing increased L shoulder weakness with fatigue.  Therapist providing demonstration, tactile, and verbal cues for postural control and to decrease compensatory posterior lean and shoulder hike.  Pt demonstrating LUE drag as she fatigued.  Chest presses completing 2 sets of 10.  Again noticing decreased  endurance with LUE drooping ~7 or 8 in repetition of 10. LUE shoulder ER holding 1 lb dumbbell completed 2x10 sitting EOM without difficulty after initial demonstration.  Therapist providing demonstration and occasional cues for improved technique. Attempted single shoulder flexion with pt requiring increased tactile cues to minimize compensatory patterns and posture.  Completed 2 sets of 10 each exercise. Engaged in shoulder row and front raise with yellow theraband.  Therapist provided demonstration, verbal and tactile cues, and use of mirror for visual feedback to increase awareness of body positioning and proper technique.  Pt continues to demonstrate compensatory lean and shoulder hike as well as elbow flexion during single shoulder flexion.    PATIENT EDUCATION: Provided continued-condition-specific education, reviewed and updated HEP - printed handout administered; ongoing encouragement for use of LUE during functional and structured tasks at home Person educated: Patient Education method: Explanation Education comprehension: verbalized understanding   HOME EXERCISE PROGRAM: Coordination HEP - see pt instructions; MedBridge Code OT1: [email or web address] MedBridge Code OT2: [id number]    GOALS: Goals reviewed with patient? Yes  SHORT TERM GOALS: Target date: 05/28/22   STG  Status:  1 Pt will be independent with HEP for [facility] to increase coordination as needed for ADLs and IADLs. Baseline:  Progressing  2 Pt will verbalize understanding of adapted strategies to maximize safety and I with ADLs/ IADLs . Baseline:  Progressing  3 Pt will increase 9 hole peg test by 8 seconds with LUE to demonstrate increased coordination at needed to complete ADLs and IADLs.  Baseline: Right: 35.41 sec; Left: 56.37 sec Met - 05/26/33: 39.47 sec w/ LUE  4 Pt will demonstrate ability to retrieve a lightweight object at moderate range to demonstrate increased LUE strength and ROM as need to complete ADLs and  IADLs. Baseline: Grip strength: Right: 29 lbs; Left: 24 lbs Progressing    LONG TERM GOALS: Target date: 06/25/22   LTG  Status:  1 Pt will be able to utilize LUE at non-dominant level to engage in self-care tasks without assistance. Baseline:  Progressing  2 Pt will demonstrate improved UE functional use for ADLs as evidenced by increasing box/ blocks score by 5 blocks with LUE Baseline: Right 42 blocks, Left 33 blocks Progressing - 05/26/22: 35 blocks w/ LUE  3 Pt will increase 9 hole peg test by 15 seconds with LUE to demonstrate increased coordination at needed to complete ADLs and IADLs.  Baseline: Right: 35.41 sec; Left: 56.37 sec Met - 05/26/22: 39.47 sec w/ LUE  4 Pt will demonstrate/report improvements in functional mobility and ADLs as reported on discharge functional status on FOTO by scoring >/= to 69. Baseline: 35 Progressing  5 Pt will be able to engage in IADLs with improved safety awareness, body positioning, and use of LUE as non-dominant level. Baseline:  Progressing    ASSESSMENT:  CLINICAL IMPRESSION: Treatment session with focus on LUE strengthening and attention to postural control and body mechanics during exercises.  Pt reports more motivated after PT session and expressing desire to have more strengthening exercises for UB. Pt continues to benefit from multimodal cues, especially use of demonstration and visual feedback from mirror for NMR of [facility]  with shoulder flexion, external rotation, and scapular retraction for overall shoulder stability in preparation for increased AROM and coordination.   PERFORMANCE DEFICITS in functional skills including ADLs, IADLs, coordination, dexterity, ROM, strength, pain, flexibility, FMC, GMC, mobility, balance, endurance, decreased knowledge of use of DME, and UE functional use.  IMPAIRMENTS are limiting patient from ADLs and IADLs.   COMORBIDITIES may have co-morbidities  that affects occupational performance. Patient will benefit  from skilled OT to address above impairments and improve overall function.   PLAN: OT FREQUENCY: 2x/week  OT DURATION: 8 weeks  PLANNED INTERVENTIONS: self care/ADL training, therapeutic exercise, therapeutic activity, neuromuscular re-education, manual therapy, passive range of motion, balance training, functional mobility training, electrical stimulation, ultrasound, moist heat, cryotherapy, patient/family education, visual/perceptual remediation/compensation, energy conservation, and DME and/or AE instructions  RECOMMENDED OTHER SERVICES: NA  CONSULTED AND AGREED WITH PLAN OF CARE: Patient  PLAN FOR NEXT SESSION: Review HEP exercises for carry over; continue NMR of LUE [place], [place], and strength; begin [place] with ball toss in single hand and with both hands for symmetry, functional reaching/functional tasks   Hussien Greenblatt, Bruce, OTR/L 05/31/2022, 2:53 PM

## 2022-05-31 NOTE — Therapy (Signed)
OUTPATIENT PHYSICAL THERAPY NEURO TREATMENT   Patient Name: Pamela Cox MRN: [id number] DOB:02-Oct-1941, 81 y.o., female Today's Date: 05/31/2022   PCP: [facility] REFERRING PROVIDER: Kalman Drape, DO    PT End of Session - 05/31/22 1407     Visit Number 7    Number of Visits 13    Date for PT Re-Evaluation 06/09/22    Authorization Type Aetna Medicare    PT Start Time 1400    PT Stop Time 1445    PT Time Calculation (min) 45 min    Activity Tolerance Patient tolerated treatment well    Behavior During Therapy WFL for tasks assessed/performed               Past Medical History:  Diagnosis Date   Arthritis    CML (chronic myelocytic leukemia) ([place])    CVA (cerebral vascular accident) ([place])    Diabetes mellitus without complication ([place])    GERD (gastroesophageal reflux disease)    Hypertension    Myelocytic leukemia, chronic ([place])    Obesity    Sleep apnea    Past Surgical History:  Procedure Laterality Date   APPENDECTOMY     COLONOSCOPY     COLONOSCOPY WITH PROPOFOL N/A 07/16/2019   Procedure: COLONOSCOPY WITH PROPOFOL;  Surgeon: Lollie Sails, MD;  [facility] ENDOSCOPY;  Service: Endoscopy;  Laterality: N/A;   ESOPHAGOGASTRODUODENOSCOPY (EGD) WITH PROPOFOL N/A 07/16/2019   Procedure: ESOPHAGOGASTRODUODENOSCOPY (EGD) WITH PROPOFOL;  Surgeon: Lollie Sails, MD;  Location: [facility] ENDOSCOPY;  Service: Endoscopy;  Laterality: N/A;   TOTAL SHOULDER REPLACEMENT Right 2021   TUBAL LIGATION     Patient Active Problem List   Diagnosis Date Noted   Allergic rhinitis due to pollen 08/23/2018   DDD (degenerative disc disease), lumbar 08/23/2018   Lumbar neuritis 08/23/2018   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   GERD (gastroesophageal reflux disease) 08/23/2018   Morbid obesity ([place]) 08/23/2018   Obstructive sleep apnea 08/23/2018   Intervertebral disc disorder with radiculopathy of lumbar region 08/23/2018   CML (chronic myelocytic  leukemia) ([place]) 08/23/2018   Hx of myocardial perfusion scan 08/23/2018   Osteoarthritis 08/23/2018   Degenerative arthritis of hip 08/23/2018   Lumbar spondylosis 08/23/2018   SOB (shortness of breath) 08/23/2018   History of CVA (cerebrovascular accident) 08/23/2018   Essential hypertension 08/23/2018   Chronic right shoulder pain 08/23/2018   Complete tear of right rotator cuff 08/23/2018   Acute pain of left shoulder 08/23/2018   On antineoplastic chemotherapy 08/23/2018   Hyperlipidemia 08/23/2018   Neurogenic claudication due to lumbar spinal stenosis 08/23/2018   Vitamin D deficiency 08/23/2018    ONSET DATE: 04/13/22  REFERRING DIAG: I63.81 (ICD-10-CM) - Other cerebral infarction due to occlusion or stenosis of small artery   THERAPY DIAG:  Muscle weakness (generalized)  Difficulty in walking, not elsewhere classified  Unsteadiness on feet  Chronic bilateral low back pain with bilateral sciatica  Rationale for Evaluation and Treatment Rehabilitation  SUBJECTIVE:  SUBJECTIVE STATEMENT: Didn't do much over the weekend. Back pain comes and goes. I'd like to get a shot for it, but can't come off the blood thinners Pt accompanied by: self  PERTINENT HISTORY: Chronic Myelocytic leukemia, prior MCA CVA (no residual deficits) and TIA, peripheral arterial disease, GERD, HTN, DMII, R TSA 2021  PAIN:  Are you having pain? Yes: NPRS scale: 4-5/10 Pain location: LBP Pain description: dull Aggravating factors: all movements Relieving factors: Gabapentin/moist heat  PRECAUTIONS: Fall  PATIENT GOALS work on strengthening and speech  OBJECTIVE:   TODAY'S TREATMENT: 05/31/22 Activity Comments  NU-stepev level 5 x 5 min for dynamic warm-up   Standing hip abd 3x10 3#, cues for slow  control  Standing hip ext 3x10 3#, cues for isolated control, limit trunk flexion  Sidestepping parallel bars x 2 min 3# ankle weights, able to tolerate 1 min 50 sec before rest  Alt Stair taps x 2 min  3# cues for upright posture and core contraction  LAQ 3x10 3#   Standing on foam: EO 3x30" EC 3x10" Head rotation/vertical: 3x5 reps    TODAY'S TREATMENT: 05/26/22 Activity Comments  Seated BLE AROM 3x10 fr dyanmic warm-up AP, LAQ,   Standing hip abd 2x10 3#, cues for slow pace and brief isometric hold at end-range  Standing hip ext 2x10 3#, cues for slow pace and brief isometric hold at end-range  Sidestepping parallel bars x 2 min 3# ankle weights Able to last 1 min 50 sec before back/legs fatigue  Alt stair taps x 2 min 3# ankle weights, 6" step  Standing on foam: 3x15 sec EO, EC, EO head turns        HOME EXERCISE PROGRAM Last updated: 05/12/22  Access Code: [email or web address] URL: [email or web address] Date: 05/12/2022 Prepared by: [facility]  Exercises - Heel Toe Raises with Counter Support  - 1 x daily - 5 x weekly - 2 sets - 10 reps - Sit to Stand with Counter Support  - 1 x daily - 5 x weekly - 2 sets - 10 reps - Side Stepping with Counter Support  - 1 x daily - 5 x weekly - 2 sets - 10 reps - Standing Gastroc Stretch at Counter  - 1 x daily - 5 x weekly - 2 sets - 30 sec hold - Seated Flexion Stretch with Swiss Ball  - 1 x daily - 5 x weekly - 2 sets - 10 reps - 5 sec hold - Seated Thoracic Flexion and Rotation with Swiss Ball  - 1 x daily - 5 x weekly - 2 sets - 10 reps - 5 sec hold - Supine Lower Trunk Rotation  - 1 x daily - 5 x weekly - 2 sets - 10 reps - Seated Piriformis Stretch  - 1 x daily - 5 x weekly - 2 sets - 30 sec hold - Seated Figure 4 Piriformis Stretch  - 1 x daily - 5 x weekly - 2 sets - 30 sec hold  Below measures were taken at time of initial evaluation unless otherwise specified:  DIAGNOSTIC FINDINGS: MR  without contrast (04/13/22) Acute, small lacunar infarct in posterior limb of the right internal capsule. Sequelae of remote left parietal lobe infarct.  CT Brain without contrast (04/13/22) Unchanged background of chronic microvascular ischemic white matter changes and scattered remote small vessel infarcts.  COGNITION: Overall cognitive status: No family/caregiver present to determine baseline cognitive functioning   SENSATION: WFL  COORDINATION: Alt pronation/supination  slightly dysmetric on L; B heel to shin limited by strength/ROM   MUSCLE TONE: normal in B LEs  POSTURE: rounded shoulders, forward head, increased thoracic kyphosis, and posterior pelvic tilt  LOWER EXTREMITY ROM:     Active  Right Eval Left Eval  Hip flexion    Hip extension    Hip abduction    Hip adduction    Hip internal rotation    Hip external rotation    Knee flexion    Knee extension    Ankle dorsiflexion 10 5  Ankle plantarflexion    Ankle inversion    Ankle eversion     (Blank rows = not tested)  LOWER EXTREMITY MMT:    MMT (in sitting) Right Eval Left Eval  Hip flexion 4+ 4+  Hip extension    Hip abduction 4 4-  Hip adduction 4 4  Hip internal rotation    Hip external rotation    Knee flexion 4 4  Knee extension 4+ 4  Ankle dorsiflexion 4 4-  Ankle plantarflexion 4 4  Ankle inversion    Ankle eversion    (Blank rows = not tested)   GAIT: Gait pattern: step to pattern, step through pattern, decreased step length- Right, decreased stance time- Left, and trunk flexed Assistive device utilized: Environmental consultant - 2 wheeled Level of assistance: Modified independence   FUNCTIONAL TESTs:  5 times sit to stand: 17.36 sec with B UE support from 21 inch seat height Timed up and go (TUG): 27/77 sec with RW; imbalance and trouble staying within confines of walker during turns [employer] Scale: 30/56 indicating high risk for falls  PATIENT SURVEYS:  FOTO [phone]    PATIENT  EDUCATION: Education details: prognosis, POC, HEP Person educated: Patient Education method: Explanation, Demonstration, Tactile cues, Verbal cues, and Handouts Education comprehension: verbalized understanding   HOME EXERCISE PROGRAM: Access Code: [email or web address] URL: [email or web address] Date: 04/28/2022 Prepared by: [facility]  Exercises - Heel Toe Raises with Counter Support  - 1 x daily - 5 x weekly - 2 sets - 10 reps - Sit to Stand with Counter Support  - 1 x daily - 5 x weekly - 2 sets - 10 reps - Side Stepping with Counter Support  - 1 x daily - 5 x weekly - 2 sets - 10 reps - Standing Gastroc Stretch at Counter  - 1 x daily - 5 x weekly - 2 sets - 30 sec hold    GOALS: Goals reviewed with patient? Yes  SHORT TERM GOALS: Target date: 05/19/2022  Patient to be independent with initial HEP. Baseline: HEP initiated Goal status: IN PROGRESS    LONG TERM GOALS: Target date: 06/09/2022  Patient to be independent with advanced HEP. Baseline: Not yet initiated  Goal status: IN PROGRESS  Patient to demonstrate B LE strength >/=4+/5.  Baseline: See above Goal status: IN PROGRESS  Patient to demonstrate at least 10 deg L ankle dorsiflexion AROM.Marland Kitchen  Baseline: 5 deg Goal status: IN PROGRESS  Patient to demonstrate alternating reciprocal pattern when ascending and descending stairs with good stability and 1 handrail as needed.   Baseline: NT Goal status: IN PROGRESS  Patient to complete TUG in <14 sec with LRAD in order to decrease risk of falls.   Baseline: 27.77 sec with RW Goal status: IN PROGRESS  Patient to demonstrate 5xSTS test in <15 sec in order to decrease risk of falls.  Baseline: 17.36 sec with B UE support from  21 inch seat height Goal status: IN PROGRESS  Patient to score at least 45/56 on [place] in order to decrease risk of falls.  Baseline: NT Goal status: IN PROGRESS   ASSESSMENT:  CLINICAL  IMPRESSION: Demonstrates improved tolerance for [place] PRE with increase in reps to 3x10 and demo improved posture/control with these isolated movements.  Onset of LBP/fatigue requiring seated rest periods throughout session but with improved activity tolerance evidenced by decrease in length of seated rest periods from 2 min to one minute rest intervals.  Manifesting improved static balance on compliant surfaces with less postural sway and less reliance on UE support for balance correction. Continued sessions to progress LE strength, functional activity tolerance, and dynamic balance to reduce risk for falls    OBJECTIVE IMPAIRMENTS Abnormal gait, decreased balance, decreased coordination, decreased endurance, decreased mobility, difficulty walking, decreased ROM, decreased strength, decreased safety awareness, impaired flexibility, improper body mechanics, postural dysfunction, and pain.   ACTIVITY LIMITATIONS carrying, lifting, bending, sitting, standing, squatting, stairs, transfers, bed mobility, bathing, toileting, dressing, reach over head, hygiene/grooming, and locomotion level  PARTICIPATION LIMITATIONS: meal prep, cleaning, laundry, shopping, community activity, and church  PERSONAL FACTORS Age, Fitness, Past/current experiences, Time since onset of injury/illness/exacerbation, and 3+ comorbidities: Chronic Myelocytic leukemia, prior MCA CVA (no residual deficits) and TIA, peripheral arterial disease, GERD, HTN, DMII, R TSA 2021  are also affecting patient's functional outcome.   REHAB POTENTIAL: Good  CLINICAL DECISION MAKING: Evolving/moderate complexity  EVALUATION COMPLEXITY: Moderate  PLAN: PT FREQUENCY: 2x/week  PT DURATION: 6 weeks  PLANNED INTERVENTIONS: Therapeutic exercises, Therapeutic activity, Neuromuscular re-education, Balance training, Gait training, Patient/Family education, Joint mobilization, Stair training, Vestibular training, Canalith repositioning, DME  instructions, Aquatic Therapy, Dry Needling, Electrical stimulation, Cryotherapy, Moist heat, Taping, Manual therapy, and Re-evaluation  PLAN FOR NEXT SESSION:  stair navigation, progress LE strength and balance    2:08 PM, 05/31/22 M. Sherlyn Lees, PT, DPT Physical Therapist- [place] Office Number: [phone]   [place] [facility] [address] [place], [place] [address] Phone # [phone] Fax # [phone]

## 2022-06-02 ENCOUNTER — Encounter: Payer: Self-pay | Admitting: Speech Pathology

## 2022-06-02 ENCOUNTER — Ambulatory Visit: Payer: Medicare HMO | Admitting: Occupational Therapy

## 2022-06-02 ENCOUNTER — Ambulatory Visit: Payer: Medicare HMO | Admitting: Speech Pathology

## 2022-06-02 ENCOUNTER — Ambulatory Visit: Payer: Medicare HMO

## 2022-06-02 DIAGNOSIS — R41841 Cognitive communication deficit: Secondary | ICD-10-CM

## 2022-06-02 DIAGNOSIS — R471 Dysarthria and anarthria: Secondary | ICD-10-CM

## 2022-06-02 DIAGNOSIS — M6281 Muscle weakness (generalized): Secondary | ICD-10-CM

## 2022-06-02 DIAGNOSIS — R2681 Unsteadiness on feet: Secondary | ICD-10-CM

## 2022-06-02 DIAGNOSIS — R262 Difficulty in walking, not elsewhere classified: Secondary | ICD-10-CM

## 2022-06-02 DIAGNOSIS — R278 Other lack of coordination: Secondary | ICD-10-CM

## 2022-06-02 DIAGNOSIS — I69354 Hemiplegia and hemiparesis following cerebral infarction affecting left non-dominant side: Secondary | ICD-10-CM

## 2022-06-02 NOTE — Therapy (Signed)
OUTPATIENT SPEECH LANGUAGE PATHOLOGY TREATMENT NOTE   Patient Name: Pamela Cox MRN: [id number] DOB:August 07, 1941, 81 y.o., female Today's Date: 06/02/2022  PCP: Loann Quill, MD REFERRING PROVIDER: Jerold Coombe, DO  END OF SESSION:   End of Session - 06/02/22 1317     Visit Number 7    Number of Visits 23    Date for SLP Re-Evaluation 07/22/22    SLP Start Time 1315    SLP Stop Time  52    SLP Time Calculation (min) 40 min    Activity Tolerance Patient tolerated treatment well              Past Medical History:  Diagnosis Date   Arthritis    CML (chronic myelocytic leukemia) ([place])    CVA (cerebral vascular accident) ([place])    Diabetes mellitus without complication ([place])    GERD (gastroesophageal reflux disease)    Hypertension    Myelocytic leukemia, chronic ([place])    Obesity    Sleep apnea    Past Surgical History:  Procedure Laterality Date   APPENDECTOMY     COLONOSCOPY     COLONOSCOPY WITH PROPOFOL N/A 07/16/2019   Procedure: COLONOSCOPY WITH PROPOFOL;  Surgeon: Lollie Sails, MD;  Location: [facility] ENDOSCOPY;  Service: Endoscopy;  Laterality: N/A;   ESOPHAGOGASTRODUODENOSCOPY (EGD) WITH PROPOFOL N/A 07/16/2019   Procedure: ESOPHAGOGASTRODUODENOSCOPY (EGD) WITH PROPOFOL;  Surgeon: Lollie Sails, MD;  Location: [facility] ENDOSCOPY;  Service: Endoscopy;  Laterality: N/A;   TOTAL SHOULDER REPLACEMENT Right 2021   TUBAL LIGATION     Patient Active Problem List   Diagnosis Date Noted   Allergic rhinitis due to pollen 08/23/2018   DDD (degenerative disc disease), lumbar 08/23/2018   Lumbar neuritis 08/23/2018   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   GERD (gastroesophageal reflux disease) 08/23/2018   Morbid obesity ([place]) 08/23/2018   Obstructive sleep apnea 08/23/2018   Intervertebral disc disorder with radiculopathy of lumbar region 08/23/2018   CML (chronic myelocytic leukemia) ([place]) 08/23/2018   Hx of myocardial perfusion scan 08/23/2018    Osteoarthritis 08/23/2018   Degenerative arthritis of hip 08/23/2018   Lumbar spondylosis 08/23/2018   SOB (shortness of breath) 08/23/2018   History of CVA (cerebrovascular accident) 08/23/2018   Essential hypertension 08/23/2018   Chronic right shoulder pain 08/23/2018   Complete tear of right rotator cuff 08/23/2018   Acute pain of left shoulder 08/23/2018   On antineoplastic chemotherapy 08/23/2018   Hyperlipidemia 08/23/2018   Neurogenic claudication due to lumbar spinal stenosis 08/23/2018   Vitamin D deficiency 08/23/2018    ONSET DATE: 04/13/22  REFERRING DIAG: Lacunar infarct, acute  THERAPY DIAG:  Dysarthria and anarthria  Cognitive communication deficit  Rationale for Evaluation and Treatment Rehabilitation  SUBJECTIVE: "Are you filling in for Carl?"   PAIN:  Are you having pain? No    OBJECTIVE:  TODAY'S TREATMENT:   06/02/22: Pt reports she has been doing oral exercises at home. She had questions about the schedule because she felt like she hadn't been seen in awhile - SLP let her know that she will be continuing x2/week starting next week due to primary SLP on vacation.    Edu on WRAP strategies. Developed a plan to increase use of calendar. . She reports she writes appointments on scrap paper and then loses them. Pt reports she could put calendar near her reclining chair where she sits during the day. Discussed condensing notes into one composition book and keeping it near to use.  Pt was  able to recall 1/4 strategies at end of session (post 10 min delay).   Read 25 sentences using "clear speech". Emphasis this session was on speaking slowly. Pt was able to complete with minA verbal cues.   05/21/22: Today, SLP targetred slowing of pt's speech in conversation. SLP used 2-5 minute conversations to  incr pt's usage of speech compensatory strategies - pt used compensations 70% of the time for overall intelligibility 95%. She slowed her rate when speaking quickly 90%  of the time, sometimes mid-sentence. She cont to perform HEP at home for oral muscle strength. Tiera reports her medicines are taken correctly, no skips or doubles since return from latest ED visit. "I just ask Lavonne if I need to but I don't really need to," pt stated.  05/17/22: SLP educated pt on lingual manipulation and labial coordination (straw movement bilaterally) and labial strength (pressing with cheeks full of air). Pt initially req'd mod usual cues faded to modified independent. Established HEP section then reviewed and pt req'd occasional min A. SLP targeted pt's speech intelligibility in sentence responses with structured ST tasks. Pt's responses were slightly abnormal in prosody however had good intelligibility. Intermittent slowing of pt's speech in conversation. Educated dtr on pt's homework when saw her in lobby, alone.   05/14/22: SLP provided pt with HEP for oral motor strength today due to labial droop and numbness on pt's lt face. She req'd usual min-mod A and was provided a handout to take home. Rarely today, pt spontaneously slowed speech and overarticulated in conversation stating she was trying to incr intelligibility. Overall intelligibility approx 85%. When asked to repeat pt used these two strategies and improved intelligibility with these instances to 100%. SLP modified STG for conversation using compensations. Pt filled out her PROM - communication effectiveness, and scored 24/32.  05/12/22: Pt was seen for skilled ST services with focus on cognition and dysarthria. Pt was admitted to hospital on 05/04/22. Pt reports speech is "the same if not better" when using her strategies (she was able to recall all strategies). SLP screened cognition today due to acute change. SLP administered SLUMS - pt scored a 9/30 indicating deficits in memory, attention, cognitive flexibility, and executive functioning skills. See below:   SLU Mental Status (SLUMS Examination)  Orientation: 3/3 Delayed  Recall w/ Interference: 2/5 Numeric Calculation and Registration: 0/3 Immediate Recall w/ Interference (Generative naming): 2/3 Registration and Digit Span: 0/2 Visual Spatial/Exec Functioning: 2/6 Executive Functioning/Extrapolation:  0/8  Pt reported her drooling is the same, but also reports anterior spillage from R side. SLP suggested pt use straw to assist in decreasing labial spillage. SLP suggest adding cognitive goals for patient with focus on memory. Cont with current POC.       PATIENT EDUCATION: Education details: HEP procedure, rationale Person educated: Patient  Education method: Explanation, Demonstration, Verbal cues, and Handout Education comprehension: verbalized understanding, returned demonstration, verbal cues required, and needs further education         GOALS: Goals reviewed with patient? Yes   SHORT TERM GOALS: Target date: 05/28/2022     Pt will perform HEP for dysarthria compensation (overarticulation) with rare min A in 3 sessions Baseline: Goal status: Ongoing   2.  Pt will perform HEP for dysarthria (oral motor strength) with rare min A in 3 sessions Baseline:  Goal status: Ongoing   3.  Pt will engage in 3 minutes simple conversation with 90%+ intelligibility in 2 sessions Baseline:  Goal status: Revised   4.  Pt  will complete speech PROM in first two therapy sessions Baseline:  Goal status: Met   5.  Pt will develop memory compensation/s to assist with home tasks and IADLs (e.g., speech HEP, to-do or grocery list, med administration, etc) Baseline:  Goal status: Ongoing   LONG TERM GOALS: Target date: 07/22/2022     Pt will perform HEP for dysarthria compensation (overarticulation) with modified independence or SBA in 3 sessions Baseline:  Goal status: Ongoing   2.  Pt will perform HEP for dysarthria (oral motor strength) with modified independence or SBA in 3 sessions Baseline:  Goal status: Ongoing   3.  Pt will engage in 15 minutes  simple-mod complex conversation with 95%+ intelligibility with compensations in 3 sessions Baseline:  Goal status: Ongoing   4.  Pt will improve score on speech PROM taken in her last two therapy sessions Baseline:  Goal status: Ongoing  5.   Pt will report use of/use memory compensation/s to assist with home tasks and IADLs in 5 sessions Baseline:  Goal status: Ongoing   ASSESSMENT:   CLINICAL IMPRESSION: Patient is a 81 y.o. female with diagnosis of dysarthria. See tx note. Pt spontaneously used compensations for speech clarity in conversation more frequently today. 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    OBJECTIVE IMPAIRMENTS include memory, apraxia, and dysarthria. These impairments are limiting patient from household responsibilities and effectively communicating at home and in community. Factors affecting potential to achieve goals and functional outcome are previous level of function.. Patient will benefit from skilled SLP services to address above impairments and improve overall function.   REHAB POTENTIAL: Good   PLAN: SLP FREQUENCY: 2x/week   SLP DURATION: 12 weeks   PLANNED INTERVENTIONS: Environmental controls, Cueing hierachy, Cognitive reorganization, Internal/external aids, Oral motor exercises, Functional tasks, Multimodal communication approach, SLP instruction and feedback, Compensatory strategies, and Patient/family education       [place], CCC-SLP 06/02/2022, 1:18 PM

## 2022-06-02 NOTE — Therapy (Signed)
OUTPATIENT PHYSICAL THERAPY NEURO TREATMENT   Patient Name: Pamela Cox MRN: [id number] DOB:03/17/1941, 81 y.o., female Today's Date: 06/02/2022   PCP: [facility] REFERRING PROVIDER: Kalman Drape, DO    PT End of Session - 06/02/22 1410     Visit Number 8    Number of Visits 13    Date for PT Re-Evaluation 06/09/22    Authorization Type [employer]    PT Start Time 1400    PT Stop Time 1445    PT Time Calculation (min) 45 min    Activity Tolerance Patient tolerated treatment well    Behavior During Therapy WFL for tasks assessed/performed               Past Medical History:  Diagnosis Date   Arthritis    CML (chronic myelocytic leukemia) ([place])    CVA (cerebral vascular accident) ([place])    Diabetes mellitus without complication ([place])    GERD (gastroesophageal reflux disease)    Hypertension    Myelocytic leukemia, chronic ([place])    Obesity    Sleep apnea    Past Surgical History:  Procedure Laterality Date   APPENDECTOMY     COLONOSCOPY     COLONOSCOPY WITH PROPOFOL N/A 07/16/2019   Procedure: COLONOSCOPY WITH PROPOFOL;  Surgeon: Lollie Sails, MD;  Location: [facility] ENDOSCOPY;  Service: Endoscopy;  Laterality: N/A;   ESOPHAGOGASTRODUODENOSCOPY (EGD) WITH PROPOFOL N/A 07/16/2019   Procedure: ESOPHAGOGASTRODUODENOSCOPY (EGD) WITH PROPOFOL;  Surgeon: Lollie Sails, MD;  Location: [facility] ENDOSCOPY;  Service: Endoscopy;  Laterality: N/A;   TOTAL SHOULDER REPLACEMENT Right 2021   TUBAL LIGATION     Patient Active Problem List   Diagnosis Date Noted   Allergic rhinitis due to pollen 08/23/2018   DDD (degenerative disc disease), lumbar 08/23/2018   Lumbar neuritis 08/23/2018   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   GERD (gastroesophageal reflux disease) 08/23/2018   Morbid obesity ([place]) 08/23/2018   Obstructive sleep apnea 08/23/2018   Intervertebral disc disorder with radiculopathy of lumbar region 08/23/2018   CML (chronic myelocytic  leukemia) ([place]) 08/23/2018   Hx of myocardial perfusion scan 08/23/2018   Osteoarthritis 08/23/2018   Degenerative arthritis of hip 08/23/2018   Lumbar spondylosis 08/23/2018   SOB (shortness of breath) 08/23/2018   History of CVA (cerebrovascular accident) 08/23/2018   Essential hypertension 08/23/2018   Chronic right shoulder pain 08/23/2018   Complete tear of right rotator cuff 08/23/2018   Acute pain of left shoulder 08/23/2018   On antineoplastic chemotherapy 08/23/2018   Hyperlipidemia 08/23/2018   Neurogenic claudication due to lumbar spinal stenosis 08/23/2018   Vitamin D deficiency 08/23/2018    ONSET DATE: 04/13/22  REFERRING DIAG: I63.81 (ICD-10-CM) - Other cerebral infarction due to occlusion or stenosis of small artery   THERAPY DIAG:  Muscle weakness (generalized)  Unsteadiness on feet  Difficulty in walking, not elsewhere classified  Rationale for Evaluation and Treatment Rehabilitation  SUBJECTIVE:  SUBJECTIVE STATEMENT: Didn't do much over the weekend. Back pain comes and goes. I'd like to get a shot for it, but can't come off the blood thinners Pt accompanied by: self  PERTINENT HISTORY: Chronic Myelocytic leukemia, prior MCA CVA (no residual deficits) and TIA, peripheral arterial disease, GERD, HTN, DMII, R TSA 2021  PAIN:  Are you having pain? Yes: NPRS scale: 4-5/10 Pain location: LBP Pain description: dull Aggravating factors: all movements Relieving factors: Gabapentin/moist heat  PRECAUTIONS: Fall  PATIENT GOALS work on strengthening and speech  OBJECTIVE:   TODAY'S TREATMENT: 06/02/22 Activity Comments  NU-step level 5 x 5 min 70-80 SPM   Standing hip abd 2x10 4#  Standing hip ext 2x10 4#  Sidestepping x 2 min 4# cues for core tight and toes straight   Alt Stair taps x 2 min  4# cues for upright posture and core contraction  LAQ 2x10 4#   Standing on foam EO 2x30 sec EC 3x10 sec Head rotation/vertical 5x  Foot on 8" step 3x10 sec intermittent UE support    TODAY'S TREATMENT: 05/31/22 Activity Comments  NU-stepev level 5 x 5 min for dynamic warm-up   Standing hip abd 3x10 3#, cues for slow control  Standing hip ext 3x10 3#, cues for isolated control, limit trunk flexion  Sidestepping parallel bars x 2 min 3# ankle weights, able to tolerate 1 min 50 sec before rest  Alt Stair taps x 2 min  3# cues for upright posture and core contraction  LAQ 3x10 3#   Standing on foam: EO 3x30" EC 3x10" Head rotation/vertical: 3x5 reps    TODAY'S TREATMENT: 05/26/22 Activity Comments  Seated BLE AROM 3x10 fr dyanmic warm-up AP, LAQ,   Standing hip abd 2x10 3#, cues for slow pace and brief isometric hold at end-range  Standing hip ext 2x10 3#, cues for slow pace and brief isometric hold at end-range  Sidestepping parallel bars x 2 min 3# ankle weights Able to last 1 min 50 sec before back/legs fatigue  Alt stair taps x 2 min 3# ankle weights, 6" step  Standing on foam: 3x15 sec EO, EC, EO head turns        HOME EXERCISE PROGRAM Last updated: 05/12/22  Access Code: [email or web address] URL: [email or web address] Date: 05/12/2022 Prepared by: [facility]  Exercises - Heel Toe Raises with Counter Support  - 1 x daily - 5 x weekly - 2 sets - 10 reps - Sit to Stand with Counter Support  - 1 x daily - 5 x weekly - 2 sets - 10 reps - Side Stepping with Counter Support  - 1 x daily - 5 x weekly - 2 sets - 10 reps - Standing Gastroc Stretch at Counter  - 1 x daily - 5 x weekly - 2 sets - 30 sec hold - Seated Flexion Stretch with Swiss Ball  - 1 x daily - 5 x weekly - 2 sets - 10 reps - 5 sec hold - Seated Thoracic Flexion and Rotation with Swiss Ball  - 1 x daily - 5 x weekly - 2 sets - 10 reps - 5 sec hold -  Supine Lower Trunk Rotation  - 1 x daily - 5 x weekly - 2 sets - 10 reps - Seated Piriformis Stretch  - 1 x daily - 5 x weekly - 2 sets - 30 sec hold - Seated Figure 4 Piriformis Stretch  - 1 x daily - 5 x weekly -  2 sets - 30 sec hold  Below measures were taken at time of initial evaluation unless otherwise specified:  DIAGNOSTIC FINDINGS: MR without contrast (04/13/22) Acute, small lacunar infarct in posterior limb of the right internal capsule. Sequelae of remote left parietal lobe infarct.  CT Brain without contrast (04/13/22) Unchanged background of chronic microvascular ischemic white matter changes and scattered remote small vessel infarcts.  COGNITION: Overall cognitive status: No family/caregiver present to determine baseline cognitive functioning   SENSATION: WFL  COORDINATION: Alt pronation/supination slightly dysmetric on L; B heel to shin limited by strength/ROM   MUSCLE TONE: normal in B LEs  POSTURE: rounded shoulders, forward head, increased thoracic kyphosis, and posterior pelvic tilt  LOWER EXTREMITY ROM:     Active  Right Eval Left Eval  Hip flexion    Hip extension    Hip abduction    Hip adduction    Hip internal rotation    Hip external rotation    Knee flexion    Knee extension    Ankle dorsiflexion 10 5  Ankle plantarflexion    Ankle inversion    Ankle eversion     (Blank rows = not tested)  LOWER EXTREMITY MMT:    MMT (in sitting) Right Eval Left Eval  Hip flexion 4+ 4+  Hip extension    Hip abduction 4 4-  Hip adduction 4 4  Hip internal rotation    Hip external rotation    Knee flexion 4 4  Knee extension 4+ 4  Ankle dorsiflexion 4 4-  Ankle plantarflexion 4 4  Ankle inversion    Ankle eversion    (Blank rows = not tested)   GAIT: Gait pattern: step to pattern, step through pattern, decreased step length- Right, decreased stance time- Left, and trunk flexed Assistive device utilized: Environmental consultant - 2 wheeled Level of assistance:  Modified independence   FUNCTIONAL TESTs:  5 times sit to stand: 17.36 sec with B UE support from 21 inch seat height Timed up and go (TUG): 27/77 sec with RW; imbalance and trouble staying within confines of walker during turns [employer] Scale: 30/56 indicating high risk for falls  PATIENT SURVEYS:  FOTO [phone]    PATIENT EDUCATION: Education details: prognosis, POC, HEP Person educated: Patient Education method: Explanation, Demonstration, Tactile cues, Verbal cues, and Handouts Education comprehension: verbalized understanding   HOME EXERCISE PROGRAM: Access Code: [email or web address] URL: [email or web address] Date: 04/28/2022 Prepared by: [facility]  Exercises - Heel Toe Raises with Counter Support  - 1 x daily - 5 x weekly - 2 sets - 10 reps - Sit to Stand with Counter Support  - 1 x daily - 5 x weekly - 2 sets - 10 reps - Side Stepping with Counter Support  - 1 x daily - 5 x weekly - 2 sets - 10 reps - Standing Gastroc Stretch at Counter  - 1 x daily - 5 x weekly - 2 sets - 30 sec hold    GOALS: Goals reviewed with patient? Yes  SHORT TERM GOALS: Target date: 05/19/2022  Patient to be independent with initial HEP. Baseline: HEP initiated Goal status: IN PROGRESS    LONG TERM GOALS: Target date: 06/09/2022  Patient to be independent with advanced HEP. Baseline: Not yet initiated  Goal status: IN PROGRESS  Patient to demonstrate B LE strength >/=4+/5.  Baseline: See above Goal status: IN PROGRESS  Patient to demonstrate at least 10 deg L ankle dorsiflexion AROM.Marland Kitchen  Baseline:  5 deg Goal status: IN PROGRESS  Patient to demonstrate alternating reciprocal pattern when ascending and descending stairs with good stability and 1 handrail as needed.   Baseline: NT Goal status: IN PROGRESS  Patient to complete TUG in <14 sec with LRAD in order to decrease risk of falls.   Baseline: 27.77 sec with RW Goal status: IN  PROGRESS  Patient to demonstrate 5xSTS test in <15 sec in order to decrease risk of falls.  Baseline: 17.36 sec with B UE support from 21 inch seat height Goal status: IN PROGRESS  Patient to score at least 45/56 on Berg in order to decrease risk of falls.  Baseline: NT Goal status: IN PROGRESS   ASSESSMENT:  CLINICAL IMPRESSION: Progressing with POC details and increased resistance today for PRE albeit with decrease in reps to assess reaction at next session.  Tolerating tx with fewer rest periods and decrease in rest length from previous 2 min now to 30-45 sec rest periods.  Continued sessions indicated to progress strength and acitvity tolerance    OBJECTIVE IMPAIRMENTS Abnormal gait, decreased balance, decreased coordination, decreased endurance, decreased mobility, difficulty walking, decreased ROM, decreased strength, decreased safety awareness, impaired flexibility, improper body mechanics, postural dysfunction, and pain.   ACTIVITY LIMITATIONS carrying, lifting, bending, sitting, standing, squatting, stairs, transfers, bed mobility, bathing, toileting, dressing, reach over head, hygiene/grooming, and locomotion level  PARTICIPATION LIMITATIONS: meal prep, cleaning, laundry, shopping, community activity, and church  PERSONAL FACTORS Age, Fitness, Past/current experiences, Time since onset of injury/illness/exacerbation, and 3+ comorbidities: Chronic Myelocytic leukemia, prior MCA CVA (no residual deficits) and TIA, peripheral arterial disease, GERD, HTN, DMII, R TSA 2021  are also affecting patient's functional outcome.   REHAB POTENTIAL: Good  CLINICAL DECISION MAKING: Evolving/moderate complexity  EVALUATION COMPLEXITY: Moderate  PLAN: PT FREQUENCY: 2x/week  PT DURATION: 6 weeks  PLANNED INTERVENTIONS: Therapeutic exercises, Therapeutic activity, Neuromuscular re-education, Balance training, Gait training, Patient/Family education, Joint mobilization, Stair training,  Vestibular training, Canalith repositioning, DME instructions, Aquatic Therapy, Dry Needling, Electrical stimulation, Cryotherapy, Moist heat, Taping, Manual therapy, and Re-evaluation  PLAN FOR NEXT SESSION:  stair navigation, progress LE strength and balance    2:10 PM, 06/02/22 M. Sherlyn Lees, PT, DPT Physical Therapist- [place] Office Number: [phone]   [facility] [address] [place], [place] [address] Phone # [phone] Fax # [phone]

## 2022-06-02 NOTE — Therapy (Unsigned)
OUTPATIENT OCCUPATIONAL THERAPY TREATMENT NOTE  Patient Name: Pamela Cox MRN: [id number] DOB:08-Jul-1941, 81 y.o., female Today's Date: 06/02/2022  PCP: [facility] REFERRING PROVIDER: Kalman Drape, DO    OT End of Session - 06/02/22 1456     Visit Number 7    Number of Visits 17    Date for OT Re-Evaluation 06/25/22    Authorization Type [employer]    OT Start Time 0814    OT Stop Time 1530    OT Time Calculation (min) 41 min    Activity Tolerance Patient tolerated treatment well    Behavior During Therapy WFL for tasks assessed/performed            Past Medical History:  Diagnosis Date   Arthritis    CML (chronic myelocytic leukemia) ([place])    CVA (cerebral vascular accident) ([place])    Diabetes mellitus without complication ([place])    GERD (gastroesophageal reflux disease)    Hypertension    Myelocytic leukemia, chronic ([place])    Obesity    Sleep apnea    Past Surgical History:  Procedure Laterality Date   APPENDECTOMY     COLONOSCOPY     COLONOSCOPY WITH PROPOFOL N/A 07/16/2019   Procedure: COLONOSCOPY WITH PROPOFOL;  Surgeon: Lollie Sails, MD;  Location[facility] ENDOSCOPY;  Service: Endoscopy;  Laterality: N/A;   ESOPHAGOGASTRODUODENOSCOPY (EGD) WITH PROPOFOL N/A 07/16/2019   Procedure: ESOPHAGOGASTRODUODENOSCOPY (EGD) WITH PROPOFOL;  Surgeon: Lollie Sails, MD;  Location: [facility] ENDOSCOPY;  Service: Endoscopy;  Laterality: N/A;   TOTAL SHOULDER REPLACEMENT Right 2021   TUBAL LIGATION     Patient Active Problem List   Diagnosis Date Noted   Allergic rhinitis due to pollen 08/23/2018   DDD (degenerative disc disease), lumbar 08/23/2018   Lumbar neuritis 08/23/2018   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   GERD (gastroesophageal reflux disease) 08/23/2018   Morbid obesity ([place]) 08/23/2018   Obstructive sleep apnea 08/23/2018   Intervertebral disc disorder with radiculopathy of lumbar region 08/23/2018   CML (chronic myelocytic leukemia)  ([place]) 08/23/2018   Hx of myocardial perfusion scan 08/23/2018   Osteoarthritis 08/23/2018   Degenerative arthritis of hip 08/23/2018   Lumbar spondylosis 08/23/2018   SOB (shortness of breath) 08/23/2018   History of CVA (cerebrovascular accident) 08/23/2018   Essential hypertension 08/23/2018   Chronic right shoulder pain 08/23/2018   Complete tear of right rotator cuff 08/23/2018   Acute pain of left shoulder 08/23/2018   On antineoplastic chemotherapy 08/23/2018   Hyperlipidemia 08/23/2018   Neurogenic claudication due to lumbar spinal stenosis 08/23/2018   Vitamin D deficiency 08/23/2018    ONSET DATE: 04/12/22  REFERRING DIAG: I63.81 (ICD-10-CM) - 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  Rationale for Evaluation and Treatment Rehabilitation  SUBJECTIVE:   SUBJECTIVE STATEMENT: *** Pt accompanied by: self  PAIN: Are you having pain? Yes: NPRS scale: 5-6/10 Pain location: lower back Pain description: sore Aggravating factors: reaching overhead, laying down Relieving factors: heat pack  PERTINENT HISTORY: R lacunar infarct w/ L-sided weakness; PMH includes CML, prior CVA (no residual deficits) and TIA, peripheral arterial disease, GERD, and HTN  PRECAUTIONS: Fall  PATIENT GOALS: to have more strength and walk better  OBJECTIVE:   TODAY'S TREATMENT - 06/02/22: Towel slides w/ 4.4 lb weighted ball Wrist extension w/ 3 lb dumbbell Overhead reach w/ 2.2 lb weighted ball; lowering back down to chest height completed 2x5 w/ rest breaks  between sets and multimodal cues for decreased compensatory movements Coordination activities (see pt instructions)  LUE shoulder strengthening with seated shoulder flexion with dowel for symmetry.  Pt completed 2 sets of 10, therapist noticing increased L shoulder weakness with fatigue.  Therapist providing demonstration, tactile, and verbal cues for postural control  and to decrease compensatory posterior lean and shoulder hike.  Pt demonstrating LUE drag as she fatigued.  Chest presses completing 2 sets of 10.  Again noticing decreased endurance with LUE drooping ~7 or 8 in repetition of 10. LUE shoulder ER holding 1 lb dumbbell completed 2x10 sitting EOM without difficulty after initial demonstration.  Therapist providing demonstration and occasional cues for improved technique. Attempted single shoulder flexion with pt requiring increased tactile cues to minimize compensatory patterns and posture.  Completed 2 sets of 10 each exercise. Engaged in shoulder row and front raise with yellow theraband.  Therapist provided demonstration, verbal and tactile cues, and use of mirror for visual feedback to increase awareness of body positioning and proper technique.  Pt continues to demonstrate compensatory lean and shoulder hike as well as elbow flexion during single shoulder flexion.    PATIENT EDUCATION: Provided continued-condition-specific education, reviewed and updated HEP - printed handout administered; ongoing encouragement for use of LUE during functional and structured tasks at home Person educated: Patient Education method: Explanation Education comprehension: verbalized understanding   HOME EXERCISE PROGRAM: Coordination HEP - see pt instructions; MedBridge Code OT1: [id number] MedBridge Code OT2: [id number]    GOALS: Goals reviewed with patient? Yes  SHORT TERM GOALS: Target date: 05/28/22   STG  Status:  1 Pt will be independent with HEP for [facility] to increase coordination as needed for ADLs and IADLs. Baseline:  Progressing  2 Pt will verbalize understanding of adapted strategies to maximize safety and I with ADLs/IADLs. Baseline:  Progressing  3 Pt will increase 9 hole peg test by 8 seconds with LUE to demonstrate increased coordination at needed to complete ADLs and IADLs.  Baseline: Right: 35.41 sec; Left: 56.37 sec Met - 05/26/33: 39.47 sec w/  LUE  4 Pt will demonstrate ability to retrieve a lightweight object at moderate range to demonstrate increased LUE strength and ROM as need to complete ADLs and IADLs. Baseline: Grip strength: Right: 29 lbs; Left: 24 lbs Met    LONG TERM GOALS: Target date: 06/25/22   LTG  Status:  1 Pt will be able to utilize LUE at non-dominant level to engage in self-care tasks without assistance. Baseline:  Progressing  2 Pt will demonstrate improved UE functional use for ADLs as evidenced by increasing box/ blocks score by 5 blocks with LUE Baseline: Right 42 blocks, Left 33 blocks Progressing - 05/26/22: 35 blocks w/ LUE  3 Pt will increase 9 hole peg test by 15 seconds with LUE to demonstrate increased coordination at needed to complete ADLs and IADLs.  Baseline: Right: 35.41 sec; Left: 56.37 sec Met - 05/26/22: 39.47 sec w/ LUE  4 Pt will demonstrate/report improvements in functional mobility and ADLs as reported on discharge functional status on FOTO by scoring >/= to 69. Baseline: 10 Progressing  5 Pt will be able to engage in IADLs with improved safety awareness, body positioning, and use of LUE as non-dominant level. Baseline:  Progressing    ASSESSMENT:  CLINICAL IMPRESSION: Treatment session with focus on LUE strengthening and attention to postural control and body mechanics during exercises.  Pt reports more motivated after PT session and expressing desire to have more strengthening  exercises for UB. Pt continues to benefit from multimodal cues, especially use of demonstration and visual feedback from mirror for NMR of [place] with shoulder flexion, external rotation, and scapular retraction for overall shoulder stability in preparation for increased AROM and coordination.   PERFORMANCE DEFICITS in functional skills including ADLs, IADLs, coordination, dexterity, ROM, strength, pain, flexibility, FMC, GMC, mobility, balance, endurance, decreased knowledge of use of DME, and UE functional  use.  IMPAIRMENTS are limiting patient from ADLs and IADLs.   COMORBIDITIES may have co-morbidities  that affects occupational performance. Patient will benefit from skilled OT to address above impairments and improve overall function.   PLAN: OT FREQUENCY: 2x/week  OT DURATION: 8 weeks  PLANNED INTERVENTIONS: self care/ADL training, therapeutic exercise, therapeutic activity, neuromuscular re-education, manual therapy, passive range of motion, balance training, functional mobility training, electrical stimulation, ultrasound, moist heat, cryotherapy, patient/family education, visual/perceptual remediation/compensation, energy conservation, and DME and/or AE instructions  RECOMMENDED OTHER SERVICES: NA  CONSULTED AND AGREED WITH PLAN OF CARE: Patient  PLAN FOR NEXT SESSION: Review HEP exercises for carry over; continue NMR of LUE [place], [place], and strength; begin [place] with ball toss in single hand and with both hands for symmetry, functional reaching/functional tasks   Kathrine Cords, OTR/L 06/02/2022, 3:30 PM

## 2022-06-07 NOTE — Therapy (Signed)
OUTPATIENT SPEECH LANGUAGE PATHOLOGY TREATMENT NOTE   Patient Name: Pamela Cox MRN: [id number] DOB:26-Jul-1941, 81 y.o., female Today's Date: 06/08/2022  PCP: Pamela Quill, MD REFERRING PROVIDER: Jerold Coombe, DO  END OF SESSION:   End of Session - 06/08/22 1452     Visit Number 8    Number of Visits 23    Date for SLP Re-Evaluation 07/22/22    SLP Start Time 1405    SLP Stop Time  1452    SLP Time Calculation (min) 47 min    Activity Tolerance Patient tolerated treatment well               Past Medical History:  Diagnosis Date   Arthritis    CML (chronic myelocytic leukemia) ([place])    CVA (cerebral vascular accident) ([place])    Diabetes mellitus without complication ([place])    GERD (gastroesophageal reflux disease)    Hypertension    Myelocytic leukemia, chronic ([place])    Obesity    Sleep apnea    Past Surgical History:  Procedure Laterality Date   APPENDECTOMY     COLONOSCOPY     COLONOSCOPY WITH PROPOFOL N/A 07/16/2019   Procedure: COLONOSCOPY WITH PROPOFOL;  Surgeon: Pamela Sails, MD;  Location: [facility] ENDOSCOPY;  Service: Endoscopy;  Laterality: N/A;   ESOPHAGOGASTRODUODENOSCOPY (EGD) WITH PROPOFOL N/A 07/16/2019   Procedure: ESOPHAGOGASTRODUODENOSCOPY (EGD) WITH PROPOFOL;  Surgeon: Pamela Sails, MD;  Location: [facility] ENDOSCOPY;  Service: Endoscopy;  Laterality: N/A;   TOTAL SHOULDER REPLACEMENT Right 2021   TUBAL LIGATION     Patient Active Problem List   Diagnosis Date Noted   Allergic rhinitis due to pollen 08/23/2018   DDD (degenerative disc disease), lumbar 08/23/2018   Lumbar neuritis 08/23/2018   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   GERD (gastroesophageal reflux disease) 08/23/2018   Morbid obesity ([place]) 08/23/2018   Obstructive sleep apnea 08/23/2018   Intervertebral disc disorder with radiculopathy of lumbar region 08/23/2018   CML (chronic myelocytic leukemia) ([place]) 08/23/2018   Hx of myocardial perfusion scan 08/23/2018    Osteoarthritis 08/23/2018   Degenerative arthritis of hip 08/23/2018   Lumbar spondylosis 08/23/2018   SOB (shortness of breath) 08/23/2018   History of CVA (cerebrovascular accident) 08/23/2018   Essential hypertension 08/23/2018   Chronic right shoulder pain 08/23/2018   Complete tear of right rotator cuff 08/23/2018   Acute pain of left shoulder 08/23/2018   On antineoplastic chemotherapy 08/23/2018   Hyperlipidemia 08/23/2018   Neurogenic claudication due to lumbar spinal stenosis 08/23/2018   Vitamin D deficiency 08/23/2018    ONSET DATE: 04/13/22  REFERRING DIAG: Lacunar infarct, acute  THERAPY DIAG:  No diagnosis found.  Rationale for Evaluation and Treatment Rehabilitation  SUBJECTIVE: "We talked about getting a composition book instead of writing little notes all over."   PAIN:  Are you having pain? No    OBJECTIVE:  TODAY'S TREATMENT:  06/08/22: Pt told SLP about last ST session ("S" statement). "I got  my composition book, so I don't have to keep up with every little note and where things are. I just write it down in the composition book." SLP reiterated pt needs to perform HEP consistently (6 days/week) in order to regain strength in the oral musculature (if droop/dysarthria due to decr'd muscle strength). Pt was independent with overarticulation HEP but req'd min A occasionally for strength HEP. Pt was modified independent/independent by session end. SLP strongly reiterated to pt she must speak slower to improve speech intelligibility, and provided homework  for pt to practice this.  06/02/22: Pt reports she has been doing oral exercises at home. She had questions about the schedule because she felt like she hadn't been seen in awhile - SLP let her know that she will be continuing x2/week starting next week due to primary SLP on vacation.    Edu on WRAP strategies. Developed a plan to increase use of calendar. . She reports she writes appointments on scrap paper and then  loses them. Pt reports she could put calendar near her reclining chair where she sits during the day. Discussed condensing notes into one composition book and keeping it near to use.  Pt was able to recall 1/4 strategies at end of session (post 10 min delay).   Read 25 sentences using "clear speech". Emphasis this session was on speaking slowly. Pt was able to complete with minA verbal cues.   05/21/22: Today, SLP targetred slowing of pt's speech in conversation. SLP used 2-5 minute conversations to  incr pt's usage of speech compensatory strategies - pt used compensations 70% of the time for overall intelligibility 95%. She slowed her rate when speaking quickly 90% of the time, sometimes mid-sentence. She cont to perform HEP at home for oral muscle strength. Pamela Cox reports her medicines are taken correctly, no skips or doubles since return from latest ED visit. "I just ask Lavonne if I need to but I don't really need to," pt stated.  05/17/22: SLP educated pt on lingual manipulation and labial coordination (straw movement bilaterally) and labial strength (pressing with cheeks full of air). Pt initially req'd mod usual cues faded to modified independent. Established HEP section then reviewed and pt req'd occasional min A. SLP targeted pt's speech intelligibility in sentence responses with structured ST tasks. Pt's responses were slightly abnormal in prosody however had good intelligibility. Intermittent slowing of pt's speech in conversation. Educated dtr on pt's homework when saw her in lobby, alone.    PROM - communication effectiveness survey, pt self-scored 24/32.   SLU Mental Status (SLUMS Examination)  Orientation: 3/3 Delayed Recall w/ Interference: 2/5 Numeric Calculation and Registration: 0/3 Immediate Recall w/ Interference (Generative naming): 2/3 Registration and Digit Span: 0/2 Visual Spatial/Exec Functioning: 2/6 Executive Functioning/Extrapolation:  0/8  Pt reported her drooling is  the same, but also reports anterior spillage from R side. SLP suggested pt use straw to assist in decreasing labial spillage. SLP suggest adding cognitive goals for patient with focus on memory. Cont with current POC.       PATIENT EDUCATION: Education details: HEP procedure, rationale Person educated: Patient  Education method: Explanation, Demonstration, Verbal cues, and Handout Education comprehension: verbalized understanding, returned demonstration, verbal cues required, and needs further education         GOALS: Goals reviewed with patient? Yes   SHORT TERM GOALS: Target date: 05/28/2022     Pt will perform HEP for dysarthria compensation (overarticulation) with rare min A in 3 sessions Baseline: Goal status: Ongoing   2.  Pt will perform HEP for dysarthria (oral motor strength) with rare min A in 3 sessions Baseline:  Goal status: Ongoing   3.  Pt will engage in 3 minutes simple conversation with 90%+ intelligibility in 2 sessions Baseline:  Goal status: Revised   4.  Pt will complete speech PROM in first two therapy sessions Baseline:  Goal status: Met   5.  Pt will develop memory compensation/s to assist with home tasks and IADLs (e.g., speech HEP, to-do or grocery list, med administration,  etc) Baseline:  Goal status: Met   LONG TERM GOALS: Target date: 07/22/2022     Pt will perform HEP for dysarthria compensation (overarticulation) with modified independence or SBA in 3 sessions Baseline:  06-08-22 Goal status: Ongoing   2.  Pt will perform HEP for dysarthria (oral motor strength) with modified independence or SBA in 3 sessions Baseline:  Goal status: Ongoing   3.  Pt will engage in 15 minutes simple-mod complex conversation with 95%+ intelligibility with compensations in 3 sessions Baseline:  Goal status: Ongoing   4.  Pt will improve score on speech PROM taken in her last two therapy sessions Baseline:  Goal status: Ongoing  5.   Pt will report use  of/use memory compensation/s to assist with home tasks and IADLs in 5 sessions Baseline: 06-08-22 Goal status: Ongoing   ASSESSMENT:   CLINICAL IMPRESSION: Patient is a 81 y.o. female with diagnosis of dysarthria. See tx note. Pt spontaneously used compensations for speech clarity in conversation more frequently today. She req'd more cues with oral motor strength HEP than with overarticulation HEP. 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    OBJECTIVE IMPAIRMENTS include memory, apraxia, and dysarthria. These impairments are limiting patient from household responsibilities and effectively communicating at home and in community. Factors affecting potential to achieve goals and functional outcome are previous level of function.. Patient will benefit from skilled SLP services to address above impairments and improve overall function.   REHAB POTENTIAL: Good   PLAN: SLP FREQUENCY: 2x/week   SLP DURATION: 12 weeks   PLANNED INTERVENTIONS: Environmental controls, Cueing hierachy, Cognitive reorganization, Internal/external aids, Oral motor exercises, Functional tasks, Multimodal communication approach, SLP instruction and feedback, Compensatory strategies, and Patient/family education       [facility], [place] 06/08/2022, 2:53 PM

## 2022-06-08 ENCOUNTER — Ambulatory Visit: Payer: Medicare HMO

## 2022-06-08 ENCOUNTER — Ambulatory Visit: Payer: Medicare HMO | Admitting: Occupational Therapy

## 2022-06-08 DIAGNOSIS — M6281 Muscle weakness (generalized): Secondary | ICD-10-CM

## 2022-06-08 DIAGNOSIS — R278 Other lack of coordination: Secondary | ICD-10-CM

## 2022-06-08 DIAGNOSIS — R41841 Cognitive communication deficit: Secondary | ICD-10-CM

## 2022-06-08 DIAGNOSIS — R471 Dysarthria and anarthria: Secondary | ICD-10-CM

## 2022-06-08 DIAGNOSIS — I69354 Hemiplegia and hemiparesis following cerebral infarction affecting left non-dominant side: Secondary | ICD-10-CM

## 2022-06-08 NOTE — Therapy (Signed)
OUTPATIENT PHYSICAL THERAPY NEURO PROGRESS NOTE   Patient Name: Pamela Cox MRN: [id number] DOB:22-Sep-1941, 81 y.o., female Today's Date: 06/09/2022   PCP: [facility] REFERRING PROVIDER: Kalman Drape, DO   Progress Note Reporting Period 04/28/22 to 06/09/22  See note below for Objective Data and Assessment of Progress/Goals.    PT End of Session - 06/09/22 1351     Visit Number 9    Number of Visits 17    Date for PT Re-Evaluation 07/07/22    Authorization Type Aetna Medicare    PT Start Time 1234    PT Stop Time 1313    PT Time Calculation (min) 39 min    Activity Tolerance Patient tolerated treatment well    Behavior During Therapy WFL for tasks assessed/performed              Past Medical History:  Diagnosis Date   Arthritis    CML (chronic myelocytic leukemia) ([place])    CVA (cerebral vascular accident) ([place])    Diabetes mellitus without complication ([place])    GERD (gastroesophageal reflux disease)    Hypertension    Myelocytic leukemia, chronic ([place])    Obesity    Sleep apnea    Past Surgical History:  Procedure Laterality Date   APPENDECTOMY     COLONOSCOPY     COLONOSCOPY WITH PROPOFOL N/A 07/16/2019   Procedure: COLONOSCOPY WITH PROPOFOL;  Surgeon: Lollie Sails, MD;  Location: [facility] ENDOSCOPY;  Service: Endoscopy;  Laterality: N/A;   ESOPHAGOGASTRODUODENOSCOPY (EGD) WITH PROPOFOL N/A 07/16/2019   Procedure: ESOPHAGOGASTRODUODENOSCOPY (EGD) WITH PROPOFOL;  Surgeon: Lollie Sails, MD;  Location: [facility] ENDOSCOPY;  Service: Endoscopy;  Laterality: N/A;   TOTAL SHOULDER REPLACEMENT Right 2021   TUBAL LIGATION     Patient Active Problem List   Diagnosis Date Noted   Allergic rhinitis due to pollen 08/23/2018   DDD (degenerative disc disease), lumbar 08/23/2018   Lumbar neuritis 08/23/2018   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   GERD (gastroesophageal reflux disease) 08/23/2018   Morbid obesity ([place]) 08/23/2018   Obstructive  sleep apnea 08/23/2018   Intervertebral disc disorder with radiculopathy of lumbar region 08/23/2018   CML (chronic myelocytic leukemia) ([place]) 08/23/2018   Hx of myocardial perfusion scan 08/23/2018   Osteoarthritis 08/23/2018   Degenerative arthritis of hip 08/23/2018   Lumbar spondylosis 08/23/2018   SOB (shortness of breath) 08/23/2018   History of CVA (cerebrovascular accident) 08/23/2018   Essential hypertension 08/23/2018   Chronic right shoulder pain 08/23/2018   Complete tear of right rotator cuff 08/23/2018   Acute pain of left shoulder 08/23/2018   On antineoplastic chemotherapy 08/23/2018   Hyperlipidemia 08/23/2018   Neurogenic claudication due to lumbar spinal stenosis 08/23/2018   Vitamin D deficiency 08/23/2018    ONSET DATE: 04/13/22  REFERRING DIAG: I63.81 (ICD-10-CM) - Other cerebral infarction due to occlusion or stenosis of small artery   THERAPY DIAG:  Muscle weakness (generalized)  Difficulty in walking, not elsewhere classified  Unsteadiness on feet  Rationale for Evaluation and Treatment Rehabilitation  SUBJECTIVE:  SUBJECTIVE STATEMENT: I've been doing pretty good but the back has been terrible.  Pt accompanied by: self  PERTINENT HISTORY: Chronic Myelocytic leukemia, prior MCA CVA (no residual deficits) and TIA, peripheral arterial disease, GERD, HTN, DMII, R TSA 2021  PAIN:  Are you having pain? Yes: NPRS scale: 4-5/10 Pain location: LBP Pain description: dull Aggravating factors: all movements Relieving factors: Gabapentin/moist heat  PRECAUTIONS: Fall  PATIENT GOALS work on strengthening and speech  OBJECTIVE:     TODAY'S TREATMENT: 06/09/22     LOWER EXTREMITY ROM:      Active  Right Eval Left Eval Right 06/09/22 Left 06/09/22  Ankle  dorsiflexion $RemoveBefor'10 5 10 10   'VcXOdZjKKRqW$ (Blank rows = not tested)   LOWER EXTREMITY MMT:    MMT Right Eval Left Eval Right 06/09/22 Left 06/09/22   Hip flexion 4+ 4+ 4+ 4+  Hip extension      Hip abduction 4 4- 4 4  Hip adduction $RemoveBefor'4 4 4 4  'DczrsrvjzFaH$ Hip internal rotation      Hip external rotation      Knee flexion 4 4 4+ 4+  Knee extension 4+ 4 4 4+  Ankle dorsiflexion 4 4- 4+ 4+  Ankle plantarflexion 4 4 4+ 4+  Ankle inversion      Ankle eversion      (Blank rows = not tested)   Activity Comments  Stair navigation with CGA Performing sidestepping and reciprocal alternating pattern ascending, then step to descending with B UE support  Gait training with SPC and quad tip cane 2x36ft Difficulty maintaining proper sequencing and with mild instability     [facility] PT Assessment - 06/09/22 0001       Standardized Balance Assessment   Standardized Balance Assessment Five Times Sit to Stand;Timed Up and Go Test    Five times sit to stand comments  20.11   with B arm rests from standard chair     Timed Up and Go Test   Normal TUG (seconds) 22.81   with RW; limited by attention           PATIENT EDUCATION: Education details: discussion on objective progress and remaining impairments, POC Person educated: Patient Education method: Explanation Education comprehension: verbalized understanding and returned demonstration   HOME EXERCISE PROGRAM Last updated: 05/12/22  Access Code: [email or web address] URL: [email or web address] Date: 05/12/2022 Prepared by: [facility]  Exercises - Heel Toe Raises with Counter Support  - 1 x daily - 5 x weekly - 2 sets - 10 reps - Sit to Stand with Counter Support  - 1 x daily - 5 x weekly - 2 sets - 10 reps - Side Stepping with Counter Support  - 1 x daily - 5 x weekly - 2 sets - 10 reps - Standing Gastroc Stretch at Counter  - 1 x daily - 5 x weekly - 2 sets - 30 sec hold - Seated Flexion Stretch with Swiss Ball  - 1  x daily - 5 x weekly - 2 sets - 10 reps - 5 sec hold - Seated Thoracic Flexion and Rotation with Swiss Ball  - 1 x daily - 5 x weekly - 2 sets - 10 reps - 5 sec hold - Supine Lower Trunk Rotation  - 1 x daily - 5 x weekly - 2 sets - 10 reps - Seated Piriformis Stretch  - 1 x daily - 5 x weekly - 2 sets - 30 sec hold - Seated Figure 4  Piriformis Stretch  - 1 x daily - 5 x weekly - 2 sets - 30 sec hold  Below measures were taken at time of initial evaluation unless otherwise specified:  DIAGNOSTIC FINDINGS: MR without contrast (04/13/22) Acute, small lacunar infarct in posterior limb of the right internal capsule. Sequelae of remote left parietal lobe infarct.  CT Brain without contrast (04/13/22) Unchanged background of chronic microvascular ischemic white matter changes and scattered remote small vessel infarcts.  COGNITION: Overall cognitive status: No family/caregiver present to determine baseline cognitive functioning   SENSATION: WFL  COORDINATION: Alt pronation/supination slightly dysmetric on L; B heel to shin limited by strength/ROM   MUSCLE TONE: normal in B LEs  POSTURE: rounded shoulders, forward head, increased thoracic kyphosis, and posterior pelvic tilt  LOWER EXTREMITY ROM:     Active  Right Eval Left Eval  Hip flexion    Hip extension    Hip abduction    Hip adduction    Hip internal rotation    Hip external rotation    Knee flexion    Knee extension    Ankle dorsiflexion 10 5  Ankle plantarflexion    Ankle inversion    Ankle eversion     (Blank rows = not tested)  LOWER EXTREMITY MMT:    MMT (in sitting) Right Eval Left Eval  Hip flexion 4+ 4+  Hip extension    Hip abduction 4 4-  Hip adduction 4 4  Hip internal rotation    Hip external rotation    Knee flexion 4 4  Knee extension 4+ 4  Ankle dorsiflexion 4 4-  Ankle plantarflexion 4 4  Ankle inversion    Ankle eversion    (Blank rows = not tested)   GAIT: Gait pattern: step to pattern,  step through pattern, decreased step length- Right, decreased stance time- Left, and trunk flexed Assistive device utilized: Environmental consultant - 2 wheeled Level of assistance: Modified independence   FUNCTIONAL TESTs:  5 times sit to stand: 17.36 sec with B UE support from 21 inch seat height Timed up and go (TUG): 27/77 sec with RW; imbalance and trouble staying within confines of walker during turns [employer] Scale: 30/56 indicating high risk for falls  PATIENT SURVEYS:  FOTO [phone]    PATIENT EDUCATION: Education details: prognosis, POC, HEP Person educated: Patient Education method: Explanation, Demonstration, Tactile cues, Verbal cues, and Handouts Education comprehension: verbalized understanding   HOME EXERCISE PROGRAM: Access Code: [email or web address] URL: [email or web address] Date: 04/28/2022 Prepared by: [facility]  Exercises - Heel Toe Raises with Counter Support  - 1 x daily - 5 x weekly - 2 sets - 10 reps - Sit to Stand with Counter Support  - 1 x daily - 5 x weekly - 2 sets - 10 reps - Side Stepping with Counter Support  - 1 x daily - 5 x weekly - 2 sets - 10 reps - Standing Gastroc Stretch at Counter  - 1 x daily - 5 x weekly - 2 sets - 30 sec hold    GOALS: Goals reviewed with patient? Yes  SHORT TERM GOALS: Target date: 05/19/2022  Patient to be independent with initial HEP. Baseline: HEP initiated Goal status: MET 06/09/22    LONG TERM GOALS: Target date: 07/07/2022  Patient to be independent with advanced HEP. Baseline: Not yet initiated  Goal status: IN PROGRESS  Patient to demonstrate B LE strength >/=4+/5.  Baseline: See above; 06/09/22 improved see above Goal status:  IN PROGRESS  Patient to demonstrate at least 10 deg L ankle dorsiflexion AROM.[email or web address] Kitchen  Baseline: 5 deg Goal status: MET  Patient to demonstrate alternating reciprocal pattern when ascending and descending stairs with good stability and 1  handrail as needed.   Baseline: NT; 06/09/22 able to perform with heavy B UE reliance Goal status: IN PROGRESS  Patient to complete TUG in <14 sec with LRAD in order to decrease risk of falls.   Baseline: 27.77 sec with RW; 06/09/22 22.81 sec Goal status: IN PROGRESS  Patient to demonstrate 5xSTS test in <15 sec in order to decrease risk of falls.  Baseline: 17.36 sec with B UE support from 21 inch seat height; 06/09/22 20.11 sec from standard chair height Goal status: IN PROGRESS  Patient to score at least 45/56 on [place] in order to decrease risk of falls.  Baseline: 30/56 on 05/14/22 Goal status: IN PROGRESS   ASSESSMENT:  CLINICAL IMPRESSION: Patient arrive to session with report of continued back pain. B ankle AROM is now 10 degrees, thus this goal has been met. Strength testing revealed improvement in L hip abduction, B knee flexion, L knee extension, B ankle DF and PF. Remaining weakness in B hips and R quad. Able to perform 5xSTS from lower height but at slightly slower pace. TUG score has improrved slightly but still indicates an increased risk of falls. Patient able to navigate stairs with alternating reciprocal pattern ascending, step-to descending however d/t apprehension reports sidestepping up stairs at home. Initiated gait training with cane for possible use of cane with stair navigation, however advised patient not to try this at home yet. Patient is demonstrating good progress towards goals. Would benefit from additional skilled PT services 1-2x/week for 4 weeks to address remaining goals.     OBJECTIVE IMPAIRMENTS Abnormal gait, decreased balance, decreased coordination, decreased endurance, decreased mobility, difficulty walking, decreased ROM, decreased strength, decreased safety awareness, impaired flexibility, improper body mechanics, postural dysfunction, and pain.   ACTIVITY LIMITATIONS carrying, lifting, bending, sitting, standing, squatting, stairs, transfers, bed  mobility, bathing, toileting, dressing, reach over head, hygiene/grooming, and locomotion level  PARTICIPATION LIMITATIONS: meal prep, cleaning, laundry, shopping, community activity, and church  PERSONAL FACTORS Age, Fitness, Past/current experiences, Time since onset of injury/illness/exacerbation, and 3+ comorbidities: Chronic Myelocytic leukemia, prior MCA CVA (no residual deficits) and TIA, peripheral arterial disease, GERD, HTN, DMII, R TSA 2021  are also affecting patient's functional outcome.   REHAB POTENTIAL: Good  CLINICAL DECISION MAKING: Evolving/moderate complexity  EVALUATION COMPLEXITY: Moderate  PLAN: PT FREQUENCY: 1-2x/week  PT DURATION: 4 weeks  PLANNED INTERVENTIONS: Therapeutic exercises, Therapeutic activity, Neuromuscular re-education, Balance training, Gait training, Patient/Family education, Joint mobilization, Stair training, Vestibular training, Canalith repositioning, DME instructions, Aquatic Therapy, Dry Needling, Electrical stimulation, Cryotherapy, Moist heat, Taping, Manual therapy, and Re-evaluation  PLAN FOR NEXT SESSION:  reciprocal stair navigation, continue trying gait with cane for possible use with stairs? progress LE strength and balance    Janene Harvey, PT, DPT 06/09/22 1:53 PM  [facility] at [facility] [address], [place], [place] [address] Phone # [phone] Fax # [phone]

## 2022-06-08 NOTE — Therapy (Signed)
OUTPATIENT OCCUPATIONAL THERAPY TREATMENT NOTE  Patient Name: Pamela Cox MRN: [id number] DOB:[birth year], 81 y.o., female Today's Date: 06/08/2022  PCP: [facility] REFERRING PROVIDER: Kalman Drape, DO    OT End of Session - 06/08/22 1338     Visit Number 8    Number of Visits 17    Date for OT Re-Evaluation 06/25/22    Authorization Type Aetna Medicare    OT Start Time 4650    OT Stop Time 1403    OT Time Calculation (min) 40 min    Activity Tolerance Patient tolerated treatment well    Behavior During Therapy WFL for tasks assessed/performed             Past Medical History:  Diagnosis Date   Arthritis    CML (chronic myelocytic leukemia) ([place])    CVA (cerebral vascular accident) ([place])    Diabetes mellitus without complication ([place])    GERD (gastroesophageal reflux disease)    Hypertension    Myelocytic leukemia, chronic ([place])    Obesity    Sleep apnea    Past Surgical History:  Procedure Laterality Date   APPENDECTOMY     COLONOSCOPY     COLONOSCOPY WITH PROPOFOL N/A 07/16/2019   Procedure: COLONOSCOPY WITH PROPOFOL;  Surgeon: Lollie Sails, MD;  Location[facility] ENDOSCOPY;  Service: Endoscopy;  Laterality: N/A;   ESOPHAGOGASTRODUODENOSCOPY (EGD) WITH PROPOFOL N/A 07/16/2019   Procedure: ESOPHAGOGASTRODUODENOSCOPY (EGD) WITH PROPOFOL;  Surgeon: Lollie Sails, MD;  Location: [facility] ENDOSCOPY;  Service: Endoscopy;  Laterality: N/A;   TOTAL SHOULDER REPLACEMENT Right 2021   TUBAL LIGATION     Patient Active Problem List   Diagnosis Date Noted   Allergic rhinitis due to pollen 08/23/2018   DDD (degenerative disc disease), lumbar 08/23/2018   Lumbar neuritis 08/23/2018   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   GERD (gastroesophageal reflux disease) 08/23/2018   Morbid obesity ([place]) 08/23/2018   Obstructive sleep apnea 08/23/2018   Intervertebral disc disorder with radiculopathy of lumbar region 08/23/2018   CML (chronic myelocytic  leukemia) ([place]) 08/23/2018   Hx of myocardial perfusion scan 08/23/2018   Osteoarthritis 08/23/2018   Degenerative arthritis of hip 08/23/2018   Lumbar spondylosis 08/23/2018   SOB (shortness of breath) 08/23/2018   History of CVA (cerebrovascular accident) 08/23/2018   Essential hypertension 08/23/2018   Chronic right shoulder pain 08/23/2018   Complete tear of right rotator cuff 08/23/2018   Acute pain of left shoulder 08/23/2018   On antineoplastic chemotherapy 08/23/2018   Hyperlipidemia 08/23/2018   Neurogenic claudication due to lumbar spinal stenosis 08/23/2018   Vitamin D deficiency 08/23/2018    ONSET DATE: 04/12/22  REFERRING DIAG: I63.81 (ICD-10-CM) - Other cerebral infarction due to occlusion or stenosis of small artery   THERAPY DIAG:  Hemiplegia and hemiparesis following cerebral infarction affecting left non-dominant side (HCC)  Other lack of coordination  Muscle weakness (generalized)  Rationale for Evaluation and Treatment Rehabilitation  SUBJECTIVE:   SUBJECTIVE STATEMENT: Pt reports "those nerves are bothering me all over today". Pt accompanied by: self  PAIN: Are you having pain? Yes: NPRS scale: 5-6/10 Pain location: lower back Pain description: sore Aggravating factors: reaching overhead, laying down Relieving factors: heat pack  PERTINENT HISTORY: R lacunar infarct w/ L-sided weakness; PMH includes CML, prior CVA (no residual deficits) and TIA, peripheral arterial disease, GERD, and HTN  PRECAUTIONS: Fall  PATIENT GOALS: to have more strength and walk better  OBJECTIVE:   TODAY'S TREATMENT - 06/08/22: Moist heat pack applied  to lower back for 15 min at start of session and in place during initial therapeutic activities outlined below in order to facilitate decreased tightness and pain/discomfort; no adverse reaction reported or observed Resistance Clothespins 2,4,6# with LUE for mid to high functional reaching and sustained pinch. Pt required  min cues for movement pattern and maintaining good positioning of LUE with reach.  Pt demonstrating intermittent R lean when reaching to compensate for decreased shoulder flexion. Therapist challenged pt memory, problem solving, and sequencing by providing pt with verbal instructions of pattern for clothespins.  Pt required initial mod cues fading to supervision to recall pattern.  Pt then able to remove clothespin and place on horizontal dowels per verbal instructions and pattern. Therapist directed pt in shoulder shrugs and forward/backward rolls in between reaching activities.  Pt requiring mod cues, demonstration, and breaking down of shoulder rolls to increase participation and carryover.  Pt demonstrating motor planning impairments, requiring increased demonstration and benefiting from breaking task down. Engaged in cup stacking with focus on LUE functional reach and coordination when picking up, rotating, and grading pressure to place cups without knocking over stack.  Pt demonstrating trunk lean to compensate for decreased shoulder flexion. Wall slides with focus on shoulder flexion and abduction.  Therapist providing tactile cues at shoulder to decrease shoulder hike.  Pt also demonstrating decreased motor planning with abduction, requiring hand over hand assist fading to verbal cues for technique.   PATIENT EDUCATION: Provided continued-condition-specific education and ongoing encouragement for use of LUE during functional and structured tasks at home; reviewed HEP Person educated: Patient Education method: Customer service manager Education comprehension: verbalized understanding, returned demonstration, and needs further education   HOME EXERCISE PROGRAM: Coordination HEP - see pt instructions; MedBridge Code OT1: [email or web address] MedBridge Code OT2: [id number]   GOALS: Goals reviewed with patient? Yes  SHORT TERM GOALS: Target date: 05/28/22   STG  Status:  1 Pt will be independent  with HEP for [facility] to increase coordination as needed for ADLs and IADLs. Baseline:  Progressing  2 Pt will verbalize understanding of adapted strategies to maximize safety and I with ADLs/IADLs. Baseline:  Progressing  3 Pt will increase 9 hole peg test by 8 seconds with LUE to demonstrate increased coordination at needed to complete ADLs and IADLs.  Baseline: Right: 35.41 sec; Left: 56.37 sec Met - 05/26/33: 39.47 sec w/ LUE  4 Pt will demonstrate ability to retrieve a lightweight object at moderate range to demonstrate increased LUE strength and ROM as need to complete ADLs and IADLs. Baseline: Grip strength: Right: 29 lbs; Left: 24 lbs Met    LONG TERM GOALS: Target date: 06/25/22   LTG  Status:  1 Pt will be able to utilize LUE at non-dominant level to engage in self-care tasks without assistance. Baseline:  Progressing  2 Pt will demonstrate improved UE functional use for ADLs as evidenced by increasing box/ blocks score by 5 blocks with LUE Baseline: Right 42 blocks, Left 33 blocks Progressing - 05/26/22: 35 blocks w/ LUE  3 Pt will increase 9 hole peg test by 15 seconds with LUE to demonstrate increased coordination at needed to complete ADLs and IADLs.  Baseline: Right: 35.41 sec; Left: 56.37 sec Met - 05/26/22: 39.47 sec w/ LUE  4 Pt will demonstrate/report improvements in functional mobility and ADLs as reported on discharge functional status on FOTO by scoring >/= to 69. Baseline: 70 Progressing  5 Pt will be able to engage in IADLs with improved safety awareness,  body positioning, and use of LUE as non-dominant level. Baseline:  Progressing    ASSESSMENT:  CLINICAL IMPRESSION: Treatment session with focus on LUE NMR with gross and fine motor control, ROM, coordination, and strength. Pt continues to demonstrate significant trunk extension to compensate for UE/shoulder weakness, but demonstrated fair carryover within session with varying levels of cues and facilitation from therapist.  Pt will benefit from continued incorporation of multimodal cues, especially use of demonstration and visual feedback for success.  PERFORMANCE DEFICITS in functional skills including ADLs, IADLs, coordination, dexterity, ROM, strength, pain, flexibility, FMC, GMC, mobility, balance, endurance, decreased knowledge of use of DME, and UE functional use.  IMPAIRMENTS are limiting patient from ADLs and IADLs.   COMORBIDITIES may have co-morbidities  that affects occupational performance. Patient will benefit from skilled OT to address above impairments and improve overall function.   PLAN: OT FREQUENCY: 2x/week  OT DURATION: 8 weeks  PLANNED INTERVENTIONS: self care/ADL training, therapeutic exercise, therapeutic activity, neuromuscular re-education, manual therapy, passive range of motion, balance training, functional mobility training, electrical stimulation, ultrasound, moist heat, cryotherapy, patient/family education, visual/perceptual remediation/compensation, energy conservation, and DME and/or AE instructions  RECOMMENDED OTHER SERVICES: NA  CONSULTED AND AGREED WITH PLAN OF CARE: Patient  PLAN FOR NEXT SESSION: Review HEP exercises for carryover; continue NMR of LUE GMC/strength; begin [place] with ball toss in single hand and with both hands for symmetry, functional reaching/functional tasks; postural control and body mechanics during exercises and ADLs   HOXIE, Cunningham, OTR/L 06/08/2022, 1:39 PM

## 2022-06-09 ENCOUNTER — Ambulatory Visit: Payer: Medicare HMO | Admitting: Occupational Therapy

## 2022-06-09 ENCOUNTER — Encounter: Payer: Self-pay | Admitting: Occupational Therapy

## 2022-06-09 ENCOUNTER — Ambulatory Visit: Payer: Medicare HMO | Admitting: Physical Therapy

## 2022-06-09 ENCOUNTER — Encounter: Payer: Self-pay | Admitting: Physical Therapy

## 2022-06-09 ENCOUNTER — Ambulatory Visit: Payer: Medicare HMO

## 2022-06-09 DIAGNOSIS — R262 Difficulty in walking, not elsewhere classified: Secondary | ICD-10-CM

## 2022-06-09 DIAGNOSIS — M6281 Muscle weakness (generalized): Secondary | ICD-10-CM | POA: Diagnosis not present

## 2022-06-09 DIAGNOSIS — R278 Other lack of coordination: Secondary | ICD-10-CM

## 2022-06-09 DIAGNOSIS — R471 Dysarthria and anarthria: Secondary | ICD-10-CM

## 2022-06-09 DIAGNOSIS — I69354 Hemiplegia and hemiparesis following cerebral infarction affecting left non-dominant side: Secondary | ICD-10-CM

## 2022-06-09 DIAGNOSIS — R2681 Unsteadiness on feet: Secondary | ICD-10-CM

## 2022-06-09 DIAGNOSIS — R41841 Cognitive communication deficit: Secondary | ICD-10-CM

## 2022-06-09 NOTE — Therapy (Signed)
OUTPATIENT SPEECH LANGUAGE PATHOLOGY TREATMENT NOTE   Patient Name: Pamela Cox MRN: [id number] DOB:1941/01/29, 81 y.o., female Today's Date: 06/09/2022  PCP: Pamela Quill, MD REFERRING PROVIDER: Jerold Coombe, DO  END OF SESSION:   End of Session - 06/09/22 1418     Visit Number 9    Number of Visits 23    Date for SLP Re-Evaluation 07/22/22    SLP Start Time 1405    SLP Stop Time  1445    SLP Time Calculation (min) 40 min    Activity Tolerance Patient tolerated treatment well                Past Medical History:  Diagnosis Date   Arthritis    CML (chronic myelocytic leukemia) ([place])    CVA (cerebral vascular accident) ([place])    Diabetes mellitus without complication ([place])    GERD (gastroesophageal reflux disease)    Hypertension    Myelocytic leukemia, chronic ([place])    Obesity    Sleep apnea    Past Surgical History:  Procedure Laterality Date   APPENDECTOMY     COLONOSCOPY     COLONOSCOPY WITH PROPOFOL N/A 07/16/2019   Procedure: COLONOSCOPY WITH PROPOFOL;  Surgeon: Pamela Sails, MD;  Location: [facility] ENDOSCOPY;  Service: Endoscopy;  Laterality: N/A;   ESOPHAGOGASTRODUODENOSCOPY (EGD) WITH PROPOFOL N/A 07/16/2019   Procedure: ESOPHAGOGASTRODUODENOSCOPY (EGD) WITH PROPOFOL;  Surgeon: Pamela Sails, MD;  Location: [facility] ENDOSCOPY;  Service: Endoscopy;  Laterality: N/A;   TOTAL SHOULDER REPLACEMENT Right 2021   TUBAL LIGATION     Patient Active Problem List   Diagnosis Date Noted   Allergic rhinitis due to pollen 08/23/2018   DDD (degenerative disc disease), lumbar 08/23/2018   Lumbar neuritis 08/23/2018   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   GERD (gastroesophageal reflux disease) 08/23/2018   Morbid obesity ([place]) 08/23/2018   Obstructive sleep apnea 08/23/2018   Intervertebral disc disorder with radiculopathy of lumbar region 08/23/2018   CML (chronic myelocytic leukemia) ([place]) 08/23/2018   Hx of myocardial perfusion scan 08/23/2018    Osteoarthritis 08/23/2018   Degenerative arthritis of hip 08/23/2018   Lumbar spondylosis 08/23/2018   SOB (shortness of breath) 08/23/2018   History of CVA (cerebrovascular accident) 08/23/2018   Essential hypertension 08/23/2018   Chronic right shoulder pain 08/23/2018   Complete tear of right rotator cuff 08/23/2018   Acute pain of left shoulder 08/23/2018   On antineoplastic chemotherapy 08/23/2018   Hyperlipidemia 08/23/2018   Neurogenic claudication due to lumbar spinal stenosis 08/23/2018   Vitamin D deficiency 08/23/2018    ONSET DATE: 04/13/22  REFERRING DIAG: Lacunar infarct, acute  THERAPY DIAG:  Dysarthria and anarthria  Cognitive communication deficit  Rationale for Evaluation and Treatment Rehabilitation  SUBJECTIVE: "I think it is going better at home. Maybe I have a little more trouble on the phone."   PAIN:  Are you having pain? No    OBJECTIVE:  TODAY'S TREATMENT:  06/09/22: SLP engaged in conversation for 15 minutes; SLP had to cue pt x3 to slow rate, and one nonverbal cue to slow her rate. Pt successful at this after SLP cue. Next conversation was 17 minutes and pt self-regulated her speech rate successfully - SLP only needed to provide one nonverbal cue.  SLP and pt discussed pt's memory for PT and OT exercises each day - SLP suggested she write (or have someone write for her) in her composition book each afternoon when she can perform her exercises the following day. The  next morning she can look in her composition book to remind herself when she will perform her exercises.   06/08/22: Pt told SLP about last ST session ("S" statement). "I got  my composition book, so I don't have to keep up with every little note and where things are. I just write it down in the composition book." SLP reiterated pt needs to perform HEP consistently (6 days/week) in order to regain strength in the oral musculature (if droop/dysarthria due to decr'd muscle strength). Pt was  independent with overarticulation HEP but req'd min A occasionally for strength HEP. Pt was modified independent/independent by session end. SLP strongly reiterated to pt she must speak slower to improve speech intelligibility, and provided homework for pt to practice this.  06/02/22: Pt reports she has been doing oral exercises at home. She had questions about the schedule because she felt like she hadn't been seen in awhile - SLP let her know that she will be continuing x2/week starting next week due to primary SLP on vacation.    Edu on WRAP strategies. Developed a plan to increase use of calendar. . She reports she writes appointments on scrap paper and then loses them. Pt reports she could put calendar near her reclining chair where she sits during the day. Discussed condensing notes into one composition book and keeping it near to use.  Pt was able to recall 1/4 strategies at end of session (post 10 min delay).   Read 25 sentences using "clear speech". Emphasis this session was on speaking slowly. Pt was able to complete with minA verbal cues.   05/21/22: Today, SLP targetred slowing of pt's speech in conversation. SLP used 2-5 minute conversations to  incr pt's usage of speech compensatory strategies - pt used compensations 70% of the time for overall intelligibility 95%. She slowed her rate when speaking quickly 90% of the time, sometimes mid-sentence. She cont to perform HEP at home for oral muscle strength. Pamela Cox reports her medicines are taken correctly, no skips or doubles since return from latest ED visit. "I just ask Pamela Cox if I need to but I don't really need to," pt stated.  05/17/22: SLP educated pt on lingual manipulation and labial coordination (straw movement bilaterally) and labial strength (pressing with cheeks full of air). Pt initially req'd mod usual cues faded to modified independent. Established HEP section then reviewed and pt req'd occasional min A. SLP targeted pt's speech  intelligibility in sentence responses with structured ST tasks. Pt's responses were slightly abnormal in prosody however had good intelligibility. Intermittent slowing of pt's speech in conversation. Educated dtr on pt's homework when saw her in lobby, alone.    PROM - communication effectiveness survey, pt self-scored 24/32.   SLU Mental Status (SLUMS Examination)  Orientation: 3/3 Delayed Recall w/ Interference: 2/5 Numeric Calculation and Registration: 0/3 Immediate Recall w/ Interference (Generative naming): 2/3 Registration and Digit Span: 0/2 Visual Spatial/Exec Functioning: 2/6 Executive Functioning/Extrapolation:  0/8  Pt reported her drooling is the same, but also reports anterior spillage from R side. SLP suggested pt use straw to assist in decreasing labial spillage. SLP suggest adding cognitive goals for patient with focus on memory. Cont with current POC.       PATIENT EDUCATION: Education details: see today's treatment, above Person educated: Patient  Education method: Explanation Education comprehension: verbalized understanding, returned demonstration, and needs further education         GOALS: Goals reviewed with patient? Yes   SHORT TERM GOALS: Target date:  05/28/2022     Pt will perform HEP for dysarthria compensation (overarticulation) with rare min A in 3 sessions Baseline:  Goal status: Not met   2.  Pt will perform HEP for dysarthria (oral motor strength) with rare min A in 3 sessions Baseline:  Goal status: Not met   3.  Pt will engage in 3 minutes simple conversation with 90%+ intelligibility in 2 sessions Baseline:  Goal status: Not met   4.  Pt will complete speech PROM in first two therapy sessions Baseline:  Goal status: Met   5.  Pt will develop memory compensation/s to assist with home tasks and IADLs (e.g., speech HEP, to-do or grocery list, med administration, etc) Baseline:  Goal status: Met   LONG TERM GOALS: Target date:  07/22/2022     Pt will perform HEP for dysarthria compensation (overarticulation) with modified independence or SBA in 3 sessions Baseline:  06-08-22 Goal status: Ongoing   2.  Pt will perform HEP for dysarthria (oral motor strength) with modified independence or SBA in 3 sessions Baseline:  Goal status: Ongoing   3.  Pt will engage in 15 minutes simple-mod complex conversation with 95%+ intelligibility with compensations in 3 sessions Baseline: 06-09-22 Goal status: Ongoing   4.  Pt will improve score on speech PROM taken in her last two therapy sessions Baseline:  Goal status: Ongoing  5.   Pt will report use of/use memory compensation/s to assist with home tasks and IADLs in 5 sessions Baseline: 06-08-22 Goal status: Ongoing   ASSESSMENT:   CLINICAL IMPRESSION: Patient is a 81 y.o. female with diagnosis of dysarthria. See tx note. Pt spontaneously used compensations for speech clarity in conversation more frequently today. She req'd more cues with oral motor strength HEP than with overarticulation HEP. 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    OBJECTIVE IMPAIRMENTS include memory, apraxia, and dysarthria. These impairments are limiting patient from household responsibilities and effectively communicating at home and in community. Factors affecting potential to achieve goals and functional outcome are previous level of function.. Patient will benefit from skilled SLP services to address above impairments and improve overall function.   REHAB POTENTIAL: Good   PLAN: SLP FREQUENCY: 2x/week   SLP DURATION: 12 weeks   PLANNED INTERVENTIONS: Environmental controls, Cueing hierachy, Cognitive reorganization, Internal/external aids, Oral motor exercises, Functional tasks, Multimodal communication approach, SLP instruction and feedback, Compensatory strategies, and Patient/family education       [facility],  [place] 06/09/2022, 2:19 PM

## 2022-06-09 NOTE — Therapy (Signed)
OUTPATIENT OCCUPATIONAL THERAPY TREATMENT NOTE  Patient Name: Pamela Cox MRN: [id number] DOB:09/22/1941, 81 y.o., female Today's Date: 06/09/2022  PCP: [facility] REFERRING PROVIDER: Kalman Drape, DO    OT End of Session - 06/09/22 1329     Visit Number 9    Number of Visits 17    Date for OT Re-Evaluation 06/25/22    Authorization Type [employer]    OT Start Time 1316    OT Stop Time 1357    OT Time Calculation (min) 41 min    Activity Tolerance Patient tolerated treatment well    Behavior During Therapy WFL for tasks assessed/performed            Past Medical History:  Diagnosis Date   Arthritis    CML (chronic myelocytic leukemia) ([place])    CVA (cerebral vascular accident) ([place])    Diabetes mellitus without complication ([place])    GERD (gastroesophageal reflux disease)    Hypertension    Myelocytic leukemia, chronic ([place])    Obesity    Sleep apnea    Past Surgical History:  Procedure Laterality Date   APPENDECTOMY     COLONOSCOPY     COLONOSCOPY WITH PROPOFOL N/A 07/16/2019   Procedure: COLONOSCOPY WITH PROPOFOL;  Surgeon: Lollie Sails, MD;  Location: [facility] ENDOSCOPY;  Service: Endoscopy;  Laterality: N/A;   ESOPHAGOGASTRODUODENOSCOPY (EGD) WITH PROPOFOL N/A 07/16/2019   Procedure: ESOPHAGOGASTRODUODENOSCOPY (EGD) WITH PROPOFOL;  Surgeon: Lollie Sails, MD;  Location: [facility] ENDOSCOPY;  Service: Endoscopy;  Laterality: N/A;   TOTAL SHOULDER REPLACEMENT Right 2021   TUBAL LIGATION     Patient Active Problem List   Diagnosis Date Noted   Allergic rhinitis due to pollen 08/23/2018   DDD (degenerative disc disease), lumbar 08/23/2018   Lumbar neuritis 08/23/2018   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   GERD (gastroesophageal reflux disease) 08/23/2018   Morbid obesity ([employer]) 08/23/2018   Obstructive sleep apnea 08/23/2018   Intervertebral disc disorder with radiculopathy of lumbar region 08/23/2018   CML (chronic myelocytic leukemia)  ([place]) 08/23/2018   Hx of myocardial perfusion scan 08/23/2018   Osteoarthritis 08/23/2018   Degenerative arthritis of hip 08/23/2018   Lumbar spondylosis 08/23/2018   SOB (shortness of breath) 08/23/2018   History of CVA (cerebrovascular accident) 08/23/2018   Essential hypertension 08/23/2018   Chronic right shoulder pain 08/23/2018   Complete tear of right rotator cuff 08/23/2018   Acute pain of left shoulder 08/23/2018   On antineoplastic chemotherapy 08/23/2018   Hyperlipidemia 08/23/2018   Neurogenic claudication due to lumbar spinal stenosis 08/23/2018   Vitamin D deficiency 08/23/2018    ONSET DATE: 04/12/22  REFERRING DIAG: I63.81 (ICD-10-CM) - Other cerebral infarction due to occlusion or stenosis of small artery   THERAPY DIAG:  Hemiplegia and hemiparesis following cerebral infarction affecting left non-dominant side (HCC)  Muscle weakness (generalized)  Other lack of coordination  Rationale for Evaluation and Treatment Rehabilitation  SUBJECTIVE:   SUBJECTIVE STATEMENT: Pt reports "those nerves are bothering me all over today". Pt accompanied by: self  PAIN: Are you having pain? Yes: NPRS scale: 5-6/10 Pain location: lower back Pain description: sore Aggravating factors: reaching overhead, laying down Relieving factors: heat pack  PERTINENT HISTORY: R lacunar infarct w/ L-sided weakness; PMH includes CML, prior CVA (no residual deficits) and TIA, peripheral arterial disease, GERD, and HTN  PRECAUTIONS: Fall  PATIENT GOALS: to have more strength and walk better  OBJECTIVE:   TODAY'S TREATMENT - 06/09/22:  UE Bike Reciprocal arm  bike for conditioning and BUE strengthening on Level 1.0 for 6 minutes total (forward and backward) w/ rest break at halfway point  Heat pack Moist heat pack applied to lower back for 15 min at start of tabletop activities outlined below in order to facilitate decreased tightness and pain/discomfort; no adverse reaction reported  or observed  Resistance Clothespins 4, 6# with LUE for mid to high functional reaching and sustained pinch. Pt required min cues for sequencing and maintaining good positioning of LUE with reach. Pt demonstrating intermittent R lean when reaching to compensate for decreased shoulder flex. Able to remove clothespins and return to horizontal dowels w/out difficulty  LUE NMR Closed-chain shoulder flexion 10x within mid-range while holding unweighted dowel; pt demonstrated fair carryover of learned strategies for preventing compensatory trunk movements emphasized in prior sessions  BUE forward chest press w/ 2 lb dumbbell 10x w/out difficulty  Single-arm L shoulder ER w/ 2 lb dumbbell 10x w/ OT providing AAROM near end-range  Bilateral rows w/ emphasis on scapular retraction 10x holding 2 lb dumbbells; OT provided tactile and verbal cueing throughout to decrease compensatory trunk extension    PATIENT EDUCATION: Reviewed full HEP, updating prn. Ongoing encouragement for use of LUE during functional and structured tasks at home. Also reviewed safety strategies during ADLs and functional mobility at home, including discussion of potential benefit of TTB and reacher instead of bending over to pick up objects from the floor Fall prevention w/ TTB, reacher Person educated: Patient Education method: Customer service manager Education comprehension: verbalized understanding, returned demonstration, and needs further education   HOME EXERCISE PROGRAM: Coordination HEP - see pt instructions; MedBridge Code OT1: [id number] - Putty squeezes - Rolling and pinching along length of putty - 3-point pinch - Tip pinch rolling balls w/ putty - [place]ble pick up from putty [place]: [id number] - Shoulder Flexion with Dowel to 90  - 4-5 x weekly - 2 sets - 10 reps - Chest Press with Bar  - 4-5 x weekly - 2 sets - 10 reps - Shoulder External Rotation with Dumbbell  - 4-5 x weekly - 2 sets - 10 reps -  Seated Dumbbell Rows  - 4-5 x weekly - 2 sets - 10 reps - Shoulder Front Raise with Resistance  - 4-5 x weekly - 2 sets - 10 reps   GOALS: Goals reviewed with patient? Yes  SHORT TERM GOALS: Target date: 05/28/22   STG  Status:  1 Pt will be independent with HEP for [facility] to increase coordination as needed for ADLs and IADLs. Baseline:  Met - 06/09/22  2 Pt will verbalize understanding of adapted strategies to maximize safety and I with ADLs/IADLs. Baseline:  Met - 06/09/22  3 Pt will increase 9 hole peg test by 8 seconds with LUE to demonstrate increased coordination at needed to complete ADLs and IADLs.  Baseline: Right: 35.41 sec; Left: 56.37 sec Met - 05/26/33: 39.47 sec w/ LUE  4 Pt will demonstrate ability to retrieve a lightweight object at moderate range to demonstrate increased LUE strength and ROM as need to complete ADLs and IADLs. Baseline: Grip strength: Right: 29 lbs; Left: 24 lbs Met    LONG TERM GOALS: Target date: 06/25/22   LTG  Status:  1 Pt will be able to utilize LUE at non-dominant level to engage in self-care tasks without assistance. Baseline:  Progressing  2 Pt will demonstrate improved UE functional use for ADLs as evidenced by increasing box and blocks score by 5  blocks with LUE Baseline: Right 42 blocks, Left 33 blocks Progressing - 05/26/22: 35 blocks w/ LUE  3 Pt will increase 9 hole peg test by 15 seconds with LUE to demonstrate increased coordination at needed to complete ADLs and IADLs.  Baseline: Right: 35.41 sec; Left: 56.37 sec Met - 05/26/22: 39.47 sec w/ LUE  4 Pt will demonstrate/report improvements in functional mobility and ADLs as reported on discharge functional status on FOTO by scoring >/= to 69. Baseline: 34 Progressing  5 Pt will be able to engage in IADLs with improved safety awareness, body positioning, and use of LUE as non-dominant level. Baseline:  Progressing    ASSESSMENT:  CLINICAL IMPRESSION: Pt continues to progress slowly toward  goals w/ OT focusing session on review of full HEP and continued NMR of LUE ROM, coordination, and strength. Pt continues to incorporate significant trunk extension and lateral learning to compensate for UE/shoulder weakness, but demonstrated fair carryover within session with varying levels of cues and facilitation from therapist. Pt benefits from multimodal cues, especially use of demonstration and visual feedback for success and assist w/ sequencing. Pt has met all STGs at this time and independently verbalized understanding of putty exercises and coordination HEP. Will benefit from further assessment of HEP exercises targeting UB and progressing to address BADL and IADL participation in session.  PERFORMANCE DEFICITS in functional skills including ADLs, IADLs, coordination, dexterity, ROM, strength, pain, flexibility, FMC, GMC, mobility, balance, endurance, decreased knowledge of use of DME, and UE functional use.  IMPAIRMENTS are limiting patient from ADLs and IADLs.   COMORBIDITIES may have co-morbidities  that affects occupational performance. Patient will benefit from skilled OT to address above impairments and improve overall function.   PLAN: OT FREQUENCY: 2x/week  OT DURATION: 8 weeks  PLANNED INTERVENTIONS: self care/ADL training, therapeutic exercise, therapeutic activity, neuromuscular re-education, manual therapy, passive range of motion, balance training, functional mobility training, electrical stimulation, ultrasound, moist heat, cryotherapy, patient/family education, visual/perceptual remediation/compensation, energy conservation, and DME and/or AE instructions  RECOMMENDED OTHER SERVICES: NA  CONSULTED AND AGREED WITH PLAN OF CARE: Patient  PLAN FOR NEXT SESSION: Review UB HEP exercises for carryover; practice w/ TTB (simulated transfer) and provide handout for self-purchase if successful; postural control and body mechanics during exercises and ADLs   Kathrine Cords,  OTR/L 06/09/2022, 2:28 PM

## 2022-06-14 ENCOUNTER — Encounter: Payer: Self-pay | Admitting: Physical Therapy

## 2022-06-14 ENCOUNTER — Ambulatory Visit: Payer: Medicare HMO | Admitting: Physical Therapy

## 2022-06-14 ENCOUNTER — Ambulatory Visit: Payer: Medicare HMO | Admitting: Occupational Therapy

## 2022-06-14 ENCOUNTER — Ambulatory Visit: Payer: Medicare HMO

## 2022-06-14 DIAGNOSIS — R41841 Cognitive communication deficit: Secondary | ICD-10-CM

## 2022-06-14 DIAGNOSIS — R262 Difficulty in walking, not elsewhere classified: Secondary | ICD-10-CM

## 2022-06-14 DIAGNOSIS — R2681 Unsteadiness on feet: Secondary | ICD-10-CM

## 2022-06-14 DIAGNOSIS — R471 Dysarthria and anarthria: Secondary | ICD-10-CM

## 2022-06-14 DIAGNOSIS — I69354 Hemiplegia and hemiparesis following cerebral infarction affecting left non-dominant side: Secondary | ICD-10-CM

## 2022-06-14 DIAGNOSIS — M6281 Muscle weakness (generalized): Secondary | ICD-10-CM

## 2022-06-14 DIAGNOSIS — R278 Other lack of coordination: Secondary | ICD-10-CM

## 2022-06-14 NOTE — Therapy (Signed)
OUTPATIENT SPEECH LANGUAGE PATHOLOGY TREATMENT NOTE   Patient Name: Pamela Cox MRN: [id number] DOB:05-13-1941, 81 y.o., female Today's Date: 06/14/2022  PCP: Pamela Quill, MD REFERRING PROVIDER: Jerold Coombe, DO  END OF SESSION:   End of Session - 06/14/22 1347     Visit Number 10    Number of Visits 23    Date for SLP Re-Evaluation 07/22/22    SLP Start Time 72    SLP Stop Time  1400    SLP Time Calculation (min) 42 min    Activity Tolerance Patient tolerated treatment well                Past Medical History:  Diagnosis Date   Arthritis    CML (chronic myelocytic leukemia) ([place])    CVA (cerebral vascular accident) ([place])    Diabetes mellitus without complication ([place])    GERD (gastroesophageal reflux disease)    Hypertension    Myelocytic leukemia, chronic ([place])    Obesity    Sleep apnea    Past Surgical History:  Procedure Laterality Date   APPENDECTOMY     COLONOSCOPY     COLONOSCOPY WITH PROPOFOL N/A 07/16/2019   Procedure: COLONOSCOPY WITH PROPOFOL;  Surgeon: Lollie Sails, MD;  Location: [facility] ENDOSCOPY;  Service: Endoscopy;  Laterality: N/A;   ESOPHAGOGASTRODUODENOSCOPY (EGD) WITH PROPOFOL N/A 07/16/2019   Procedure: ESOPHAGOGASTRODUODENOSCOPY (EGD) WITH PROPOFOL;  Surgeon: Lollie Sails, MD;  Location: [facility] ENDOSCOPY;  Service: Endoscopy;  Laterality: N/A;   TOTAL SHOULDER REPLACEMENT Right 2021   TUBAL LIGATION     Patient Active Problem List   Diagnosis Date Noted   Allergic rhinitis due to pollen 08/23/2018   DDD (degenerative disc disease), lumbar 08/23/2018   Lumbar neuritis 08/23/2018   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   GERD (gastroesophageal reflux disease) 08/23/2018   Morbid obesity ([place]) 08/23/2018   Obstructive sleep apnea 08/23/2018   Intervertebral disc disorder with radiculopathy of lumbar region 08/23/2018   CML (chronic myelocytic leukemia) ([place]) 08/23/2018   Hx of myocardial perfusion scan 08/23/2018    Osteoarthritis 08/23/2018   Degenerative arthritis of hip 08/23/2018   Lumbar spondylosis 08/23/2018   SOB (shortness of breath) 08/23/2018   History of CVA (cerebrovascular accident) 08/23/2018   Essential hypertension 08/23/2018   Chronic right shoulder pain 08/23/2018   Complete tear of right rotator cuff 08/23/2018   Acute pain of left shoulder 08/23/2018   On antineoplastic chemotherapy 08/23/2018   Hyperlipidemia 08/23/2018   Neurogenic claudication due to lumbar spinal stenosis 08/23/2018   Vitamin D deficiency 08/23/2018    ONSET DATE: 04/13/22  REFERRING DIAG: Lacunar infarct, acute  THERAPY DIAG:  No diagnosis found.  Rationale for Evaluation and Treatment Rehabilitation  SUBJECTIVE: "Nobody really asks me to say it again, Pamela Cox. My sister understands me pretty good (on the phone) now."   PAIN:  Are you having pain? Yes   Pain scale: 5/10 Location: lower back    OBJECTIVE:  TODAY'S TREATMENT:  06/14/22: Family and friends are not asking pt to repeat. SLP engaged pt in 2 separate conversation today, for 17 and 16 minutes, about topics related to family. SLP req'd to occasionally cue pt min A. Pamela Cox reports that she is assisting her memory for appointments with her calendar, is taking her meds with dtr verifying she took meds appropriately but reports independence with this. She is satisfied with memory skills, as they are functional at this time and she is using some compensations successfully.   06/09/22: SLP  engaged in conversation for 15 minutes; SLP had to cue pt x3 to slow rate, and one nonverbal cue to slow her rate. Pt successful at this after SLP cue. Next conversation was 17 minutes and pt self-regulated her speech rate successfully - SLP only needed to provide one nonverbal cue.  SLP and pt discussed pt's memory for PT and OT exercises each day - SLP suggested she write (or have someone write for her) in her composition book each afternoon when she can perform her  exercises the following day. The next morning she can look in her composition book to remind herself when she will perform her exercises.   06/08/22: Pt told SLP about last ST session ("S" statement). "I got  my composition book, so I don't have to keep up with every little note and where things are. I just write it down in the composition book." SLP reiterated pt needs to perform HEP consistently (6 days/week) in order to regain strength in the oral musculature (if droop/dysarthria due to decr'd muscle strength). Pt was independent with overarticulation HEP but req'd min A occasionally for strength HEP. Pt was modified independent/independent by session end. SLP strongly reiterated to pt she must speak slower to improve speech intelligibility, and provided homework for pt to practice this.  06/02/22: Pt reports she has been doing oral exercises at home. She had questions about the schedule because she felt like she hadn't been seen in awhile - SLP let her know that she will be continuing x2/week starting next week due to primary SLP on vacation.    Edu on WRAP strategies. Developed a plan to increase use of calendar. . She reports she writes appointments on scrap paper and then loses them. Pt reports she could put calendar near her reclining chair where she sits during the day. Discussed condensing notes into one composition book and keeping it near to use.  Pt was able to recall 1/4 strategies at end of session (post 10 min delay).   Read 25 sentences using "clear speech". Emphasis this session was on speaking slowly. Pt was able to complete with minA verbal cues.     PROM - communication effectiveness survey, pt self-scored 24/32.   SLU Mental Status (SLUMS Examination)  Orientation: 3/3 Delayed Recall w/ Interference: 2/5 Numeric Calculation and Registration: 0/3 Immediate Recall w/ Interference (Generative naming): 2/3 Registration and Digit Span: 0/2 Visual Spatial/Exec Functioning:  2/6 Executive Functioning/Extrapolation:  0/8  Pt reported her drooling is the same, but also reports anterior spillage from R side. SLP suggested pt use straw to assist in decreasing labial spillage. SLP suggest adding cognitive goals for patient with focus on memory. Cont with current POC.       PATIENT EDUCATION: Education details: see today's treatment, above Person educated: Patient  Education method: Explanation Education comprehension: verbalized understanding, returned demonstration, and needs further education         GOALS: Goals reviewed with patient? Yes   SHORT TERM GOALS: Target date: 05/28/2022     Pt will perform HEP for dysarthria compensation (overarticulation) with rare min A in 3 sessions Baseline:  Goal status: Not met   2.  Pt will perform HEP for dysarthria (oral motor strength) with rare min A in 3 sessions Baseline:  Goal status: Not met   3.  Pt will engage in 3 minutes simple conversation with 90%+ intelligibility in 2 sessions Baseline:  Goal status: Not met   4.  Pt will complete speech PROM in first  two therapy sessions Baseline:  Goal status: Met   5.  Pt will develop memory compensation/s to assist with home tasks and IADLs (e.g., speech HEP, to-do or grocery list, med administration, etc) Baseline:  Goal status: Met   LONG TERM GOALS: Target date: 07/22/2022     Pt will perform HEP for dysarthria compensation (overarticulation) with modified independence or SBA in 3 sessions Baseline:  06-08-22, 06-14-22 Goal status: Ongoing   2.  Pt will perform HEP for dysarthria (oral motor strength) with modified independence or SBA in 3 sessions Baseline:  06/14/22 Goal status: Ongoing   3.  Pt will engage in 15 minutes simple-mod complex conversation with 95%+ intelligibility with compensations in 3 sessions Baseline: 06-09-22 Goal status: Ongoing   4.  Pt will improve score on speech PROM taken in her last two therapy sessions Baseline:  Goal  status: Ongoing  5.   Pt will report use of/use memory compensation/s to assist with home tasks and IADLs in 5 sessions Baseline: 06-08-22 Goal status: Ongoing   ASSESSMENT:   CLINICAL IMPRESSION: Patient is a 81 y.o. female with diagnosis of dysarthria. See tx note. Pt spontaneously used compensations for speech clarity in conversation more frequently today. She req'd more cues with oral motor strength HEP than with overarticulation HEP. SLP and pt discussed pt's progress today -  family and friends are not asking pt to repeat, and pt is satisfied with memory skills at this time. Pt and SLP agreed pt would not require more than once/week ST after next week, if she desires to cont with ST. 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    OBJECTIVE IMPAIRMENTS include memory, apraxia, and dysarthria. These impairments are limiting patient from household responsibilities and effectively communicating at home and in community. Factors affecting potential to achieve goals and functional outcome are previous level of function.. Patient will benefit from skilled SLP services to address above impairments and improve overall function.   REHAB POTENTIAL: Good   PLAN: SLP FREQUENCY: 2x/week   SLP DURATION: 12 weeks   PLANNED INTERVENTIONS: Environmental controls, Cueing hierachy, Cognitive reorganization, Internal/external aids, Oral motor exercises, Functional tasks, Multimodal communication approach, SLP instruction and feedback, Compensatory strategies, and Patient/family education       [facility], [place] 06/14/2022, 1:48 PM

## 2022-06-14 NOTE — Therapy (Signed)
OUTPATIENT PHYSICAL THERAPY  NOTE   Patient Name: Pamela Cox MRN: [id number] DOB:[birth year], 81 y.o., female Today's Date: 06/14/2022   PCP: [facility] REFERRING PROVIDER: Kalman Drape, DO     PT End of Session - 06/14/22 1231     Visit Number 10    Number of Visits 17    Date for PT Re-Evaluation 07/07/22    Authorization Type [employer]    PT Start Time 1234    PT Stop Time 1313    PT Time Calculation (min) 39 min    Activity Tolerance Patient tolerated treatment well    Behavior During Therapy WFL for tasks assessed/performed               Past Medical History:  Diagnosis Date   Arthritis    CML (chronic myelocytic leukemia) ([place])    CVA (cerebral vascular accident) ([place])    Diabetes mellitus without complication ([place])    GERD (gastroesophageal reflux disease)    Hypertension    Myelocytic leukemia, chronic ([place])    Obesity    Sleep apnea    Past Surgical History:  Procedure Laterality Date   APPENDECTOMY     COLONOSCOPY     COLONOSCOPY WITH PROPOFOL N/A 07/16/2019   Procedure: COLONOSCOPY WITH PROPOFOL;  Surgeon: Lollie Sails, MD;  Location: [facility] ENDOSCOPY;  Service: Endoscopy;  Laterality: N/A;   ESOPHAGOGASTRODUODENOSCOPY (EGD) WITH PROPOFOL N/A 07/16/2019   Procedure: ESOPHAGOGASTRODUODENOSCOPY (EGD) WITH PROPOFOL;  Surgeon: Lollie Sails, MD;  Location: [facility] ENDOSCOPY;  Service: Endoscopy;  Laterality: N/A;   TOTAL SHOULDER REPLACEMENT Right 2021   TUBAL LIGATION     Patient Active Problem List   Diagnosis Date Noted   Allergic rhinitis due to pollen 08/23/2018   DDD (degenerative disc disease), lumbar 08/23/2018   Lumbar neuritis 08/23/2018   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   GERD (gastroesophageal reflux disease) 08/23/2018   Morbid obesity ([place]) 08/23/2018   Obstructive sleep apnea 08/23/2018   Intervertebral disc disorder with radiculopathy of lumbar region 08/23/2018   CML (chronic myelocytic leukemia)  ([place]) 08/23/2018   Hx of myocardial perfusion scan 08/23/2018   Osteoarthritis 08/23/2018   Degenerative arthritis of hip 08/23/2018   Lumbar spondylosis 08/23/2018   SOB (shortness of breath) 08/23/2018   History of CVA (cerebrovascular accident) 08/23/2018   Essential hypertension 08/23/2018   Chronic right shoulder pain 08/23/2018   Complete tear of right rotator cuff 08/23/2018   Acute pain of left shoulder 08/23/2018   On antineoplastic chemotherapy 08/23/2018   Hyperlipidemia 08/23/2018   Neurogenic claudication due to lumbar spinal stenosis 08/23/2018   Vitamin D deficiency 08/23/2018    ONSET DATE: 04/13/22  REFERRING DIAG: I63.81 (ICD-10-CM) - Other cerebral infarction due to occlusion or stenosis of small artery   THERAPY DIAG:  Muscle weakness (generalized)  Difficulty in walking, not elsewhere classified  Unsteadiness on feet  Rationale for Evaluation and Treatment Rehabilitation  SUBJECTIVE:  SUBJECTIVE STATEMENT: Slowly but surely, getting there, I think.   Pt accompanied by: self  PERTINENT HISTORY: Chronic Myelocytic leukemia, prior MCA CVA (no residual deficits) and TIA, peripheral arterial disease, GERD, HTN, DMII, R TSA 2021  PAIN:  Are you having pain? Yes: NPRS scale: 4-5/10 Pain location: LBP Pain description: dull Aggravating factors: all movements Relieving factors: Gabapentin/moist heat  PRECAUTIONS: Fall  PATIENT GOALS work on strengthening and speech  OBJECTIVE:    TODAY'S TREATMENT: 06/14/2022  Activity Comments  NU-step level 5 x 5 min 70-80 SPM    Standing hip abd 2x10 4#  Standing hip ext 2x10 4#  Seated bilateral ankle pumps 2 x 10; alternating heel raises, 2 x 10 reps   Sit<>stand from chair into parallel bars; cues for slowed descent into  sitting Decreased eccentric control  Sidestepping x 2 min 4# cues for core tight and toes straight  Forward/back walking in parallel bars, 4 reps Cues for upright posture and core contraction  Alt step taps to 8" block, BUE support x 10 reps, then with opposite arm lift x 10 reps    Standing on foam EO 2x30 sec EC 3x10 sec Head rotation/vertical 5x  Forward step ups, single leg, 4" step, 10 reps, BUE support        HOME EXERCISE PROGRAM Last updated: 05/12/22  Access Code: [email or web address] URL: [email or web address] Date: 05/12/2022 Prepared by: [facility]  Exercises - Heel Toe Raises with Counter Support  - 1 x daily - 5 x weekly - 2 sets - 10 reps - Sit to Stand with Counter Support  - 1 x daily - 5 x weekly - 2 sets - 10 reps - Side Stepping with Counter Support  - 1 x daily - 5 x weekly - 2 sets - 10 reps - Standing Gastroc Stretch at Counter  - 1 x daily - 5 x weekly - 2 sets - 30 sec hold - Seated Flexion Stretch with Swiss Ball  - 1 x daily - 5 x weekly - 2 sets - 10 reps - 5 sec hold - Seated Thoracic Flexion and Rotation with Swiss Ball  - 1 x daily - 5 x weekly - 2 sets - 10 reps - 5 sec hold - Supine Lower Trunk Rotation  - 1 x daily - 5 x weekly - 2 sets - 10 reps - Seated Piriformis Stretch  - 1 x daily - 5 x weekly - 2 sets - 30 sec hold - Seated Figure 4 Piriformis Stretch  - 1 x daily - 5 x weekly - 2 sets - 30 sec hold  Below measures were taken at time of initial evaluation unless otherwise specified:  DIAGNOSTIC FINDINGS: MR without contrast (04/13/22) Acute, small lacunar infarct in posterior limb of the right internal capsule. Sequelae of remote left parietal lobe infarct.  CT Brain without contrast (04/13/22) Unchanged background of chronic microvascular ischemic white matter changes and scattered remote small vessel infarcts.  COGNITION: Overall cognitive status: No family/caregiver present to determine baseline  cognitive functioning   SENSATION: WFL  COORDINATION: Alt pronation/supination slightly dysmetric on L; B heel to shin limited by strength/ROM   MUSCLE TONE: normal in B LEs  POSTURE: rounded shoulders, forward head, increased thoracic kyphosis, and posterior pelvic tilt  LOWER EXTREMITY ROM:     Active  Right Eval Left Eval  Hip flexion    Hip extension    Hip abduction    Hip adduction  Hip internal rotation    Hip external rotation    Knee flexion    Knee extension    Ankle dorsiflexion 10 5  Ankle plantarflexion    Ankle inversion    Ankle eversion     (Blank rows = not tested)  LOWER EXTREMITY MMT:    MMT (in sitting) Right Eval Left Eval  Hip flexion 4+ 4+  Hip extension    Hip abduction 4 4-  Hip adduction 4 4  Hip internal rotation    Hip external rotation    Knee flexion 4 4  Knee extension 4+ 4  Ankle dorsiflexion 4 4-  Ankle plantarflexion 4 4  Ankle inversion    Ankle eversion    (Blank rows = not tested)   GAIT: Gait pattern: step to pattern, step through pattern, decreased step length- Right, decreased stance time- Left, and trunk flexed Assistive device utilized: Environmental consultant - 2 wheeled Level of assistance: Modified independence   FUNCTIONAL TESTs:  5 times sit to stand: 17.36 sec with B UE support from 21 inch seat height Timed up and go (TUG): 27/77 sec with RW; imbalance and trouble staying within confines of walker during turns [employer] Scale: 30/56 indicating high risk for falls  PATIENT SURVEYS:  FOTO [phone]    PATIENT EDUCATION: Education details: prognosis, POC, HEP Person educated: Patient Education method: Explanation, Demonstration, Tactile cues, Verbal cues, and Handouts Education comprehension: verbalized understanding   HOME EXERCISE PROGRAM: Access Code: [email or web address] URL: [email or web address] Date: 04/28/2022 Prepared by: [facility]  Exercises - Heel Toe  Raises with Counter Support  - 1 x daily - 5 x weekly - 2 sets - 10 reps - Sit to Stand with Counter Support  - 1 x daily - 5 x weekly - 2 sets - 10 reps - Side Stepping with Counter Support  - 1 x daily - 5 x weekly - 2 sets - 10 reps - Standing Gastroc Stretch at Counter  - 1 x daily - 5 x weekly - 2 sets - 30 sec hold    GOALS: Goals reviewed with patient? Yes  SHORT TERM GOALS: Target date: 05/19/2022  Patient to be independent with initial HEP. Baseline: HEP initiated Goal status: MET 06/09/22    LONG TERM GOALS: Target date: 07/07/2022  Patient to be independent with advanced HEP. Baseline: Not yet initiated  Goal status: IN PROGRESS  Patient to demonstrate B LE strength >/=4+/5.  Baseline: See above; 06/09/22 improved see above Goal status: IN PROGRESS  Patient to demonstrate at least 10 deg L ankle dorsiflexion AROM.Marland Kitchen  Baseline: 5 deg Goal status: MET  Patient to demonstrate alternating reciprocal pattern when ascending and descending stairs with good stability and 1 handrail as needed.   Baseline: NT; 06/09/22 able to perform with heavy B UE reliance Goal status: IN PROGRESS  Patient to complete TUG in <14 sec with LRAD in order to decrease risk of falls.   Baseline: 27.77 sec with RW; 06/09/22 22.81 sec Goal status: IN PROGRESS  Patient to demonstrate 5xSTS test in <15 sec in order to decrease risk of falls.  Baseline: 17.36 sec with B UE support from 21 inch seat height; 06/09/22 20.11 sec from standard chair height Goal status: IN PROGRESS  Patient to score at least 45/56 on Berg in order to decrease risk of falls.  Baseline: 30/56 on 05/14/22 Goal status: IN PROGRESS   ASSESSMENT:  CLINICAL IMPRESSION: Skilled PT session focused on functional  lower extremity strengthening and balance activities.  Pt requires cues throughout to improve posture, lessen forward trunk lean.  She requires multiple seated breaks throughout session, and during these rest breaks  performed ankle exercises.  She is continueing to benefit from skilled PT towards LTGs for improved strength, balance, and gait for overall improved functional mobility.    OBJECTIVE IMPAIRMENTS Abnormal gait, decreased balance, decreased coordination, decreased endurance, decreased mobility, difficulty walking, decreased ROM, decreased strength, decreased safety awareness, impaired flexibility, improper body mechanics, postural dysfunction, and pain.   ACTIVITY LIMITATIONS carrying, lifting, bending, sitting, standing, squatting, stairs, transfers, bed mobility, bathing, toileting, dressing, reach over head, hygiene/grooming, and locomotion level  PARTICIPATION LIMITATIONS: meal prep, cleaning, laundry, shopping, community activity, and church  PERSONAL FACTORS Age, Fitness, Past/current experiences, Time since onset of injury/illness/exacerbation, and 3+ comorbidities: Chronic Myelocytic leukemia, prior MCA CVA (no residual deficits) and TIA, peripheral arterial disease, GERD, HTN, DMII, R TSA 2021  are also affecting patient's functional outcome.   REHAB POTENTIAL: Good  CLINICAL DECISION MAKING: Evolving/moderate complexity  EVALUATION COMPLEXITY: Moderate  PLAN: PT FREQUENCY: 1-2x/week  PT DURATION: 4 weeks  PLANNED INTERVENTIONS: Therapeutic exercises, Therapeutic activity, Neuromuscular re-education, Balance training, Gait training, Patient/Family education, Joint mobilization, Stair training, Vestibular training, Canalith repositioning, DME instructions, Aquatic Therapy, Dry Needling, Electrical stimulation, Cryotherapy, Moist heat, Taping, Manual therapy, and Re-evaluation  PLAN FOR NEXT SESSION:  Continue to work towards reciprocal stair navigation, continue trying gait with cane for possible use with stairs? progress LE strength and balance    Mady Haagensen, PT 06/14/22 1:18 PM Phone: [phone] Fax: [place] [facility] [address], [place], [place] [address] Phone # [phone] Fax # [phone]

## 2022-06-14 NOTE — Therapy (Signed)
OUTPATIENT OCCUPATIONAL THERAPY TREATMENT NOTE  Patient Name: Pamela Cox MRN: [id number] DOB:09-22-1941, 81 y.o., female Today's Date: 06/14/2022  PCP: [facility] REFERRING PROVIDER: Kalman Drape, DO   Occupational Therapy Progress Note  Dates of Reporting Period: 04/28/2022 to 06/14/2022  Reason Skilled Services are Required: ***    OT End of Session - 06/14/22 1228     Visit Number 10    Number of Visits 17    Date for OT Re-Evaluation 06/25/22    Authorization Type [employer] Medicare    OT Start Time 1153    OT Stop Time 1233    OT Time Calculation (min) 40 min    Activity Tolerance Patient tolerated treatment well    Behavior During Therapy WFL for tasks assessed/performed             Past Medical History:  Diagnosis Date   Arthritis    CML (chronic myelocytic leukemia) ([place])    CVA (cerebral vascular accident) ([place])    Diabetes mellitus without complication ([place])    GERD (gastroesophageal reflux disease)    Hypertension    Myelocytic leukemia, chronic ([place])    Obesity    Sleep apnea    Past Surgical History:  Procedure Laterality Date   APPENDECTOMY     COLONOSCOPY     COLONOSCOPY WITH PROPOFOL N/A 07/16/2019   Procedure: COLONOSCOPY WITH PROPOFOL;  Surgeon: Lollie Sails, MD;  Location: [facility] ENDOSCOPY;  Service: Endoscopy;  Laterality: N/A;   ESOPHAGOGASTRODUODENOSCOPY (EGD) WITH PROPOFOL N/A 07/16/2019   Procedure: ESOPHAGOGASTRODUODENOSCOPY (EGD) WITH PROPOFOL;  Surgeon: Lollie Sails, MD;  Location: [facility] ENDOSCOPY;  Service: Endoscopy;  Laterality: N/A;   TOTAL SHOULDER REPLACEMENT Right 2021   TUBAL LIGATION     Patient Active Problem List   Diagnosis Date Noted   Allergic rhinitis due to pollen 08/23/2018   DDD (degenerative disc disease), lumbar 08/23/2018   Lumbar neuritis 08/23/2018   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   GERD (gastroesophageal reflux disease) 08/23/2018   Morbid obesity ([place]) 08/23/2018    Obstructive sleep apnea 08/23/2018   Intervertebral disc disorder with radiculopathy of lumbar region 08/23/2018   CML (chronic myelocytic leukemia) ([place]) 08/23/2018   Hx of myocardial perfusion scan 08/23/2018   Osteoarthritis 08/23/2018   Degenerative arthritis of hip 08/23/2018   Lumbar spondylosis 08/23/2018   SOB (shortness of breath) 08/23/2018   History of CVA (cerebrovascular accident) 08/23/2018   Essential hypertension 08/23/2018   Chronic right shoulder pain 08/23/2018   Complete tear of right rotator cuff 08/23/2018   Acute pain of left shoulder 08/23/2018   On antineoplastic chemotherapy 08/23/2018   Hyperlipidemia 08/23/2018   Neurogenic claudication due to lumbar spinal stenosis 08/23/2018   Vitamin D deficiency 08/23/2018    ONSET DATE: 04/12/22  REFERRING DIAG: I63.81 (ICD-10-CM) - Other cerebral infarction due to occlusion or stenosis of small artery   THERAPY DIAG:  Hemiplegia and hemiparesis following cerebral infarction affecting left non-dominant side (HCC)  Muscle weakness (generalized)  Other lack of coordination  Unsteadiness on feet  Rationale for Evaluation and Treatment Rehabilitation  SUBJECTIVE:   SUBJECTIVE STATEMENT: ***Pt reports "those nerves are bothering me all over today". Pt accompanied by: self  PAIN: Are you having pain? Yes: NPRS scale: 5/10 Pain location: lower back Pain description: sore Aggravating factors: reaching overhead, laying down Relieving factors: heat pack  PERTINENT HISTORY: R lacunar infarct w/ L-sided weakness; PMH includes CML, prior CVA (no residual deficits) and TIA, peripheral arterial disease, GERD, and HTN  PRECAUTIONS: Fall  PATIENT GOALS: to have more strength and walk better  OBJECTIVE:   TODAY'S TREATMENT - 06/14/22: Engaged in reaching and dynamic standing balance while completing peg patten replication with large pegs.  Pt limited by back pain in prolonged standing Head applied 15 mins and  engaged in tasks in sitting in order to facilitate decreased tightness and pain/discomfort; no adverse reaction reported or observed.       UE Bike Reciprocal arm bike for conditioning and BUE strengthening on Level 1.0 for 6 minutes total (forward and backward) w/ rest break at halfway point  Heat pack Moist heat pack applied to lower back for 15 min at start of tabletop activities outlined below in order to facilitate decreased tightness and pain/discomfort; no adverse reaction reported or observed  Resistance Clothespins 4, 6# with LUE for mid to high functional reaching and sustained pinch. Pt required min cues for sequencing and maintaining good positioning of LUE with reach. Pt demonstrating intermittent R lean when reaching to compensate for decreased shoulder flex. Able to remove clothespins and return to horizontal dowels w/out difficulty  LUE NMR Closed-chain shoulder flexion 10x within mid-range while holding unweighted dowel; pt demonstrated fair carryover of learned strategies for preventing compensatory trunk movements emphasized in prior sessions  BUE forward chest press w/ 2 lb dumbbell 10x w/out difficulty  Single-arm L shoulder ER w/ 2 lb dumbbell 10x w/ OT providing AAROM near end-range  Bilateral rows w/ emphasis on scapular retraction 10x holding 2 lb dumbbells; OT provided tactile and verbal cueing throughout to decrease compensatory trunk extension    PATIENT EDUCATION: Reviewed full HEP, updating prn. Ongoing encouragement for use of LUE during functional and structured tasks at home. Also reviewed safety strategies during ADLs and functional mobility at home, including discussion of potential benefit of TTB and reacher instead of bending over to pick up objects from the floor Fall prevention w/ TTB, reacher Person educated: Patient Education method: Customer service manager Education comprehension: verbalized understanding, returned demonstration, and needs further  education   HOME EXERCISE PROGRAM: Coordination HEP - see pt instructions; MedBridge Code OT1: [id number] - Putty squeezes - Rolling and pinching along length of putty - 3-point pinch - Tip pinch rolling balls w/ putty - [place] pick up from putty [place]: [id number] - Shoulder Flexion with Dowel to 90  - 4-5 x weekly - 2 sets - 10 reps - Chest Press with Bar  - 4-5 x weekly - 2 sets - 10 reps - Shoulder External Rotation with Dumbbell  - 4-5 x weekly - 2 sets - 10 reps - Seated Dumbbell Rows  - 4-5 x weekly - 2 sets - 10 reps - Shoulder Front Raise with Resistance  - 4-5 x weekly - 2 sets - 10 reps   GOALS: Goals reviewed with patient? Yes  SHORT TERM GOALS: Target date: 05/28/22   STG  Status:  1 Pt will be independent with HEP for [facility] to increase coordination as needed for ADLs and IADLs. Baseline:  Met - 06/09/22  2 Pt will verbalize understanding of adapted strategies to maximize safety and I with ADLs/IADLs. Baseline:  Met - 06/09/22  3 Pt will increase 9 hole peg test by 8 seconds with LUE to demonstrate increased coordination at needed to complete ADLs and IADLs.  Baseline: Right: 35.41 sec; Left: 56.37 sec Met - 05/26/33: 39.47 sec w/ LUE  4 Pt will demonstrate ability to retrieve a lightweight object at moderate range to demonstrate increased  LUE strength and ROM as need to complete ADLs and IADLs. Baseline: Grip strength: Right: 29 lbs; Left: 24 lbs Met    LONG TERM GOALS: Target date: 06/25/22   LTG  Status:  1 Pt will be able to utilize LUE at non-dominant level to engage in self-care tasks without assistance. Baseline:  Progressing  2 Pt will demonstrate improved UE functional use for ADLs as evidenced by increasing box and blocks score by 5 blocks with LUE Baseline: Right 42 blocks, Left 33 blocks Progressing - 05/26/22: 35 blocks w/ LUE  3 Pt will increase 9 hole peg test by 15 seconds with LUE to demonstrate increased coordination at needed to complete ADLs  and IADLs.  Baseline: Right: 35.41 sec; Left: 56.37 sec Met - 05/26/22: 39.47 sec w/ LUE  4 Pt will demonstrate/report improvements in functional mobility and ADLs as reported on discharge functional status on FOTO by scoring >/= to 69. Baseline: 74 Progressing  5 Pt will be able to engage in IADLs with improved safety awareness, body positioning, and use of LUE as non-dominant level. Baseline:  Progressing    ASSESSMENT:  CLINICAL IMPRESSION: Pt continues to progress slowly toward goals w/ OT focusing session on review of full HEP and continued NMR of LUE ROM, coordination, and strength. Pt continues to incorporate significant trunk extension and lateral learning to compensate for UE/shoulder weakness, but demonstrated fair carryover within session with varying levels of cues and facilitation from therapist. Pt benefits from multimodal cues, especially use of demonstration and visual feedback for success and assist w/ sequencing. Pt has met all STGs at this time and independently verbalized understanding of putty exercises and coordination HEP. Will benefit from further assessment of HEP exercises targeting UB and progressing to address BADL and IADL participation in session.  PERFORMANCE DEFICITS in functional skills including ADLs, IADLs, coordination, dexterity, ROM, strength, pain, flexibility, FMC, GMC, mobility, balance, endurance, decreased knowledge of use of DME, and UE functional use.  IMPAIRMENTS are limiting patient from ADLs and IADLs.   COMORBIDITIES may have co-morbidities  that affects occupational performance. Patient will benefit from skilled OT to address above impairments and improve overall function.   PLAN: OT FREQUENCY: 2x/week  OT DURATION: 8 weeks  PLANNED INTERVENTIONS: self care/ADL training, therapeutic exercise, therapeutic activity, neuromuscular re-education, manual therapy, passive range of motion, balance training, functional mobility training, electrical  stimulation, ultrasound, moist heat, cryotherapy, patient/family education, visual/perceptual remediation/compensation, energy conservation, and DME and/or AE instructions  RECOMMENDED OTHER SERVICES: NA  CONSULTED AND AGREED WITH PLAN OF CARE: Patient  PLAN FOR NEXT SESSION: Review UB HEP exercises for carryover; practice w/ TTB (simulated transfer) and provide handout for self-purchase if successful; postural control and body mechanics during exercises and ADLs   Clancey Welton, [place], OTR/L 06/14/2022, 1:11 PM

## 2022-06-15 NOTE — Therapy (Signed)
OUTPATIENT PHYSICAL THERAPY  NOTE   Patient Name: Pamela Cox MRN: [id number] DOB:1941/09/06, 81 y.o., female Today's Date: 06/16/2022   PCP: [facility] REFERRING PROVIDER: Kalman Drape, DO     PT End of Session - 06/16/22 1316     Visit Number 11    Number of Visits 17    Date for PT Re-Evaluation 07/07/22    Authorization Type Aetna Medicare    PT Start Time 1247   pt late   PT Stop Time 1316    PT Time Calculation (min) 29 min    Activity Tolerance Patient tolerated treatment well    Behavior During Therapy WFL for tasks assessed/performed                Past Medical History:  Diagnosis Date   Arthritis    CML (chronic myelocytic leukemia) ([place])    CVA (cerebral vascular accident) ([place])    Diabetes mellitus without complication ([place])    GERD (gastroesophageal reflux disease)    Hypertension    Myelocytic leukemia, chronic ([place])    Obesity    Sleep apnea    Past Surgical History:  Procedure Laterality Date   APPENDECTOMY     COLONOSCOPY     COLONOSCOPY WITH PROPOFOL N/A 07/16/2019   Procedure: COLONOSCOPY WITH PROPOFOL;  Surgeon: Lollie Sails, MD;  Location: [facility] ENDOSCOPY;  Service: Endoscopy;  Laterality: N/A;   ESOPHAGOGASTRODUODENOSCOPY (EGD) WITH PROPOFOL N/A 07/16/2019   Procedure: ESOPHAGOGASTRODUODENOSCOPY (EGD) WITH PROPOFOL;  Surgeon: Lollie Sails, MD;  Location: [facility] ENDOSCOPY;  Service: Endoscopy;  Laterality: N/A;   TOTAL SHOULDER REPLACEMENT Right 2021   TUBAL LIGATION     Patient Active Problem List   Diagnosis Date Noted   Allergic rhinitis due to pollen 08/23/2018   DDD (degenerative disc disease), lumbar 08/23/2018   Lumbar neuritis 08/23/2018   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   GERD (gastroesophageal reflux disease) 08/23/2018   Morbid obesity ([place]) 08/23/2018   Obstructive sleep apnea 08/23/2018   Intervertebral disc disorder with radiculopathy of lumbar region 08/23/2018   CML (chronic myelocytic  leukemia) ([place]) 08/23/2018   Hx of myocardial perfusion scan 08/23/2018   Osteoarthritis 08/23/2018   Degenerative arthritis of hip 08/23/2018   Lumbar spondylosis 08/23/2018   SOB (shortness of breath) 08/23/2018   History of CVA (cerebrovascular accident) 08/23/2018   Essential hypertension 08/23/2018   Chronic right shoulder pain 08/23/2018   Complete tear of right rotator cuff 08/23/2018   Acute pain of left shoulder 08/23/2018   On antineoplastic chemotherapy 08/23/2018   Hyperlipidemia 08/23/2018   Neurogenic claudication due to lumbar spinal stenosis 08/23/2018   Vitamin D deficiency 08/23/2018    ONSET DATE: 04/13/22  REFERRING DIAG: I63.81 (ICD-10-CM) - Other cerebral infarction due to occlusion or stenosis of small artery   THERAPY DIAG:  Muscle weakness (generalized)  Difficulty in walking, not elsewhere classified  Unsteadiness on feet  Rationale for Evaluation and Treatment Rehabilitation  SUBJECTIVE:  SUBJECTIVE STATEMENT: Has a heart monitor on to assess for heart murmur. Nothing new.  Pt accompanied by: self  PERTINENT HISTORY: Chronic Myelocytic leukemia, prior MCA CVA (no residual deficits) and TIA, peripheral arterial disease, GERD, HTN, DMII, R TSA 2021  PAIN:  Are you having pain? Yes: NPRS scale: 3-4/10 Pain location: LBP Pain description: dull Aggravating factors: all movements Relieving factors: Gabapentin/moist heat  PRECAUTIONS: Fall  PATIENT GOALS work on strengthening and speech  OBJECTIVE:     TODAY'S TREATMENT: 06/16/22 Activity Comments  Nustep L5 x 6 min UE/LEs Maintaining ~80SPM  STS from elevated mat with no UEs Good ability and effort to control descent   ball squeeze 10x5" Cues to avoid valsalva  step ups 5x each Cueing to avoid heavy  handrail use and forward trunk lean  reciprocal stair navigation 6x With B handrail; encouraged alternating reciprocal pattern ascending, step-to descending with CGA      HOME EXERCISE PROGRAM Last updated: 05/12/22  Access Code: [email or web address] URL: [email or web address] Date: 05/12/2022 Prepared by: [facility]  Exercises - Heel Toe Raises with Counter Support  - 1 x daily - 5 x weekly - 2 sets - 10 reps - Sit to Stand with Counter Support  - 1 x daily - 5 x weekly - 2 sets - 10 reps - Side Stepping with Counter Support  - 1 x daily - 5 x weekly - 2 sets - 10 reps - Standing Gastroc Stretch at Counter  - 1 x daily - 5 x weekly - 2 sets - 30 sec hold - Seated Flexion Stretch with Swiss Ball  - 1 x daily - 5 x weekly - 2 sets - 10 reps - 5 sec hold - Seated Thoracic Flexion and Rotation with Swiss Ball  - 1 x daily - 5 x weekly - 2 sets - 10 reps - 5 sec hold - Supine Lower Trunk Rotation  - 1 x daily - 5 x weekly - 2 sets - 10 reps - Seated Piriformis Stretch  - 1 x daily - 5 x weekly - 2 sets - 30 sec hold - Seated Figure 4 Piriformis Stretch  - 1 x daily - 5 x weekly - 2 sets - 30 sec hold  Below measures were taken at time of initial evaluation unless otherwise specified:  DIAGNOSTIC FINDINGS: MR without contrast (04/13/22) Acute, small lacunar infarct in posterior limb of the right internal capsule. Sequelae of remote left parietal lobe infarct.  CT Brain without contrast (04/13/22) Unchanged background of chronic microvascular ischemic white matter changes and scattered remote small vessel infarcts.  COGNITION: Overall cognitive status: No family/caregiver present to determine baseline cognitive functioning   SENSATION: WFL  COORDINATION: Alt pronation/supination slightly dysmetric on L; B heel to shin limited by strength/ROM   MUSCLE TONE: normal in B LEs  POSTURE: rounded shoulders, forward head, increased thoracic kyphosis,  and posterior pelvic tilt  LOWER EXTREMITY ROM:     Active  Right Eval Left Eval  Hip flexion    Hip extension    Hip abduction    Hip adduction    Hip internal rotation    Hip external rotation    Knee flexion    Knee extension    Ankle dorsiflexion 10 5  Ankle plantarflexion    Ankle inversion    Ankle eversion     (Blank rows = not tested)  LOWER EXTREMITY MMT:    MMT (in sitting) Right Eval Left  Eval  Hip flexion 4+ 4+  Hip extension    Hip abduction 4 4-  Hip adduction 4 4  Hip internal rotation    Hip external rotation    Knee flexion 4 4  Knee extension 4+ 4  Ankle dorsiflexion 4 4-  Ankle plantarflexion 4 4  Ankle inversion    Ankle eversion    (Blank rows = not tested)   GAIT: Gait pattern: step to pattern, step through pattern, decreased step length- Right, decreased stance time- Left, and trunk flexed Assistive device utilized: Environmental consultant - 2 wheeled Level of assistance: Modified independence   FUNCTIONAL TESTs:  5 times sit to stand: 17.36 sec with B UE support from 21 inch seat height Timed up and go (TUG): 27/77 sec with RW; imbalance and trouble staying within confines of walker during turns [employer] Scale: 30/56 indicating high risk for falls  PATIENT SURVEYS:  FOTO [phone]    PATIENT EDUCATION: Education details: prognosis, POC, HEP Person educated: Patient Education method: Explanation, Demonstration, Tactile cues, Verbal cues, and Handouts Education comprehension: verbalized understanding   HOME EXERCISE PROGRAM: Access Code: [email or web address] URL: [email or web address] Date: 04/28/2022 Prepared by: [facility]  Exercises - Heel Toe Raises with Counter Support  - 1 x daily - 5 x weekly - 2 sets - 10 reps - Sit to Stand with Counter Support  - 1 x daily - 5 x weekly - 2 sets - 10 reps - Side Stepping with Counter Support  - 1 x daily - 5 x weekly - 2 sets - 10 reps - Standing Gastroc  Stretch at Counter  - 1 x daily - 5 x weekly - 2 sets - 30 sec hold    GOALS: Goals reviewed with patient? Yes  SHORT TERM GOALS: Target date: 05/19/2022  Patient to be independent with initial HEP. Baseline: HEP initiated Goal status: MET 06/09/22    LONG TERM GOALS: Target date: 07/07/2022  Patient to be independent with advanced HEP. Baseline: Not yet initiated  Goal status: IN PROGRESS  Patient to demonstrate B LE strength >/=4+/5.  Baseline: See above; 06/09/22 improved see above Goal status: IN PROGRESS  Patient to demonstrate at least 10 deg L ankle dorsiflexion AROM.Marland Kitchen  Baseline: 5 deg Goal status: MET  Patient to demonstrate alternating reciprocal pattern when ascending and descending stairs with good stability and 1 handrail as needed.   Baseline: NT; 06/09/22 able to perform with heavy B UE reliance Goal status: IN PROGRESS  Patient to complete TUG in <14 sec with LRAD in order to decrease risk of falls.   Baseline: 27.77 sec with RW; 06/09/22 22.81 sec Goal status: IN PROGRESS  Patient to demonstrate 5xSTS test in <15 sec in order to decrease risk of falls.  Baseline: 17.36 sec with B UE support from 21 inch seat height; 06/09/22 20.11 sec from standard chair height Goal status: IN PROGRESS  Patient to score at least 45/56 on [place] in order to decrease risk of falls.  Baseline: 30/56 on 05/14/22 Goal status: IN PROGRESS   ASSESSMENT:  CLINICAL IMPRESSION: Patient arrived to session without new complaints. Notes that she is wearing a heart monitor to assess for heart murmur but denies new cardiac symptoms. Patient demonstrated excellent anterior trunk lean and ability to complete transfers from standard seat height today. Worked on LE strengthening using stairs with cueing to reduce reliance on UEs. Able to perform alternating reciprocal pattern when ascending, still using step-to pattern descending  d/t weakness. No complaints at end of session.      OBJECTIVE IMPAIRMENTS Abnormal gait, decreased balance, decreased coordination, decreased endurance, decreased mobility, difficulty walking, decreased ROM, decreased strength, decreased safety awareness, impaired flexibility, improper body mechanics, postural dysfunction, and pain.   ACTIVITY LIMITATIONS carrying, lifting, bending, sitting, standing, squatting, stairs, transfers, bed mobility, bathing, toileting, dressing, reach over head, hygiene/grooming, and locomotion level  PARTICIPATION LIMITATIONS: meal prep, cleaning, laundry, shopping, community activity, and church  PERSONAL FACTORS Age, Fitness, Past/current experiences, Time since onset of injury/illness/exacerbation, and 3+ comorbidities: Chronic Myelocytic leukemia, prior MCA CVA (no residual deficits) and TIA, peripheral arterial disease, GERD, HTN, DMII, R TSA 2021  are also affecting patient's functional outcome.   REHAB POTENTIAL: Good  CLINICAL DECISION MAKING: Evolving/moderate complexity  EVALUATION COMPLEXITY: Moderate  PLAN: PT FREQUENCY: 1-2x/week  PT DURATION: 4 weeks  PLANNED INTERVENTIONS: Therapeutic exercises, Therapeutic activity, Neuromuscular re-education, Balance training, Gait training, Patient/Family education, Joint mobilization, Stair training, Vestibular training, Canalith repositioning, DME instructions, Aquatic Therapy, Dry Needling, Electrical stimulation, Cryotherapy, Moist heat, Taping, Manual therapy, and Re-evaluation  PLAN FOR NEXT SESSION:  Continue to work towards reciprocal stair navigation, continue trying gait with cane for possible use with stairs? progress LE strength and balance    Janene Harvey, PT, DPT 06/16/22 1:19 PM  [facility] [address] [place], [place] [address] Phone # [phone] Fax # [phone]

## 2022-06-16 ENCOUNTER — Ambulatory Visit: Payer: Medicare HMO | Admitting: Physical Therapy

## 2022-06-16 ENCOUNTER — Encounter: Payer: Self-pay | Admitting: Physical Therapy

## 2022-06-16 ENCOUNTER — Ambulatory Visit: Payer: Medicare HMO

## 2022-06-16 ENCOUNTER — Ambulatory Visit: Payer: Medicare HMO | Admitting: Occupational Therapy

## 2022-06-16 DIAGNOSIS — R41841 Cognitive communication deficit: Secondary | ICD-10-CM

## 2022-06-16 DIAGNOSIS — R278 Other lack of coordination: Secondary | ICD-10-CM

## 2022-06-16 DIAGNOSIS — M6281 Muscle weakness (generalized): Secondary | ICD-10-CM

## 2022-06-16 DIAGNOSIS — R2681 Unsteadiness on feet: Secondary | ICD-10-CM

## 2022-06-16 DIAGNOSIS — I69354 Hemiplegia and hemiparesis following cerebral infarction affecting left non-dominant side: Secondary | ICD-10-CM

## 2022-06-16 DIAGNOSIS — R262 Difficulty in walking, not elsewhere classified: Secondary | ICD-10-CM

## 2022-06-16 DIAGNOSIS — R471 Dysarthria and anarthria: Secondary | ICD-10-CM

## 2022-06-16 NOTE — Therapy (Signed)
OUTPATIENT OCCUPATIONAL THERAPY TREATMENT NOTE  Patient Name: Pamela Cox MRN: [id number] DOB:1941-02-03, 81 y.o., female Today's Date: 06/16/2022  PCP: [facility] REFERRING PROVIDER: Kalman Drape, DO       OT End of Session - 06/16/22 1423     Visit Number 11    Number of Visits 17    Date for OT Re-Evaluation 06/25/22    Authorization Type [employer]    OT Start Time 2130    OT Stop Time 1445    OT Time Calculation (min) 42 min    Activity Tolerance Patient tolerated treatment well    Behavior During Therapy WFL for tasks assessed/performed              Past Medical History:  Diagnosis Date   Arthritis    CML (chronic myelocytic leukemia) ([place])    CVA (cerebral vascular accident) ([place])    Diabetes mellitus without complication ([place])    GERD (gastroesophageal reflux disease)    Hypertension    Myelocytic leukemia, chronic ([place])    Obesity    Sleep apnea    Past Surgical History:  Procedure Laterality Date   APPENDECTOMY     COLONOSCOPY     COLONOSCOPY WITH PROPOFOL N/A 07/16/2019   Procedure: COLONOSCOPY WITH PROPOFOL;  Surgeon: Lollie Sails, MD;  Location: [facility] ENDOSCOPY;  Service: Endoscopy;  Laterality: N/A;   ESOPHAGOGASTRODUODENOSCOPY (EGD) WITH PROPOFOL N/A 07/16/2019   Procedure: ESOPHAGOGASTRODUODENOSCOPY (EGD) WITH PROPOFOL;  Surgeon: Lollie Sails, MD;  Location: [facility] ENDOSCOPY;  Service: Endoscopy;  Laterality: N/A;   TOTAL SHOULDER REPLACEMENT Right 2021   TUBAL LIGATION     Patient Active Problem List   Diagnosis Date Noted   Allergic rhinitis due to pollen 08/23/2018   DDD (degenerative disc disease), lumbar 08/23/2018   Lumbar neuritis 08/23/2018   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   GERD (gastroesophageal reflux disease) 08/23/2018   Morbid obesity ([place]) 08/23/2018   Obstructive sleep apnea 08/23/2018   Intervertebral disc disorder with radiculopathy of lumbar region 08/23/2018   CML (chronic  myelocytic leukemia) ([place]) 08/23/2018   Hx of myocardial perfusion scan 08/23/2018   Osteoarthritis 08/23/2018   Degenerative arthritis of hip 08/23/2018   Lumbar spondylosis 08/23/2018   SOB (shortness of breath) 08/23/2018   History of CVA (cerebrovascular accident) 08/23/2018   Essential hypertension 08/23/2018   Chronic right shoulder pain 08/23/2018   Complete tear of right rotator cuff 08/23/2018   Acute pain of left shoulder 08/23/2018   On antineoplastic chemotherapy 08/23/2018   Hyperlipidemia 08/23/2018   Neurogenic claudication due to lumbar spinal stenosis 08/23/2018   Vitamin D deficiency 08/23/2018    ONSET DATE: 04/12/22  REFERRING DIAG: I63.81 (ICD-10-CM) - Other cerebral infarction due to occlusion or stenosis of small artery   THERAPY DIAG:  Muscle weakness (generalized)  Hemiplegia and hemiparesis following cerebral infarction affecting left non-dominant side (HCC)  Other lack of coordination  Unsteadiness on feet  Rationale for Evaluation and Treatment Rehabilitation  SUBJECTIVE:   SUBJECTIVE STATEMENT: Pt reports trying her exercises, but stating I'm sure I don't do them as much as I should. Pt accompanied by: self  PAIN: Are you having pain? Yes: NPRS scale: 1-2/10 Pain location: lower back Pain description: sore Aggravating factors: reaching overhead, laying down Relieving factors: heat pack  PERTINENT HISTORY: R lacunar infarct w/ L-sided weakness; PMH includes CML, prior CVA (no residual deficits) and TIA, peripheral arterial disease, GERD, and HTN  PRECAUTIONS: Fall  PATIENT GOALS: to have more  strength and walk better  OBJECTIVE:   TODAY'S TREATMENT - 06/16/22: Reviewed follow up with PCP and recommendations for TTB for increased safety with bathing at shower level.  Pt requesting therapist to reach out to PCP for script for TTB if possible. Completed exercises below 2 sets of 10.  Therapist providing intermittent cues for technique to  ensure increased shoulder ROM. Shoulder Flexion with Dowel to 90*  Chest Press with Bar   Shoulder External Rotation with Dumbbell   Seated Dumbbell Rows   Shoulder Front Raise with Resistance      Engaged in reaching and dynamic standing balance while completing peg patten replication with large pegs.  Pt limited by back pain in prolonged standing.  Pt demonstrating improved reaching and coordination with LUE. Head applied 15 mins and engaged in tasks in sitting in order to facilitate decreased tightness and pain/discomfort; no adverse reaction reported or observed.   Engaged in discussion regarding progress towards goals.  Therapist reiterating importance of coordination and strengthening HEP and encouraged use at home, especially as d/c approaching and pt expressing desires to wrap up therapy in the near future. Engaged in transfer training with Tub transfer bench.  Therapist set up TTB and educated on positioning of bench in shower and transfer technique.  Therapist providing demonstration followed by pt completing with min cues for proper technique.  Pt demonstrating improved safety with sit pivot transfer vs stepping over tub ledge.  Pt also reports improved support for her back with TTB with back support.  Therapist educated on use of reach and long handled sponge for LB bathing and dressing tasks due to back pain and fear of falling with forward reaching.       PATIENT EDUCATION: Ongoing encouragement for use of LUE during functional and structured tasks at home. Also reviewed safety strategies during ADLs and functional mobility at home, including discussion of potential benefit of TTB and reacher instead of bending over to pick up objects from the floor Fall prevention w/ TTB, reacher Person educated: Patient Education method: Customer service manager Education comprehension: verbalized understanding, returned demonstration, and needs further education   HOME EXERCISE  PROGRAM: Coordination HEP - see pt instructions; MedBridge Code OT1: [id number] - Putty squeezes - Rolling and pinching along length of putty - 3-point pinch - Tip pinch rolling balls w/ putty - [place]ble pick up from putty [place]: [id number] - Shoulder Flexion with Dowel to 90  - 4-5 x weekly - 2 sets - 10 reps - Chest Press with Bar  - 4-5 x weekly - 2 sets - 10 reps - Shoulder External Rotation with Dumbbell  - 4-5 x weekly - 2 sets - 10 reps - Seated Dumbbell Rows  - 4-5 x weekly - 2 sets - 10 reps - Shoulder Front Raise with Resistance  - 4-5 x weekly - 2 sets - 10 reps   GOALS: Goals reviewed with patient? Yes  SHORT TERM GOALS: Target date: 05/28/22   STG  Status:  1 Pt will be independent with HEP for [facility] to increase coordination as needed for ADLs and IADLs. Baseline:  Met - 06/09/22  2 Pt will verbalize understanding of adapted strategies to maximize safety and I with ADLs/IADLs. Baseline:  Met - 06/09/22  3 Pt will increase 9 hole peg test by 8 seconds with LUE to demonstrate increased coordination at needed to complete ADLs and IADLs.  Baseline: Right: 35.41 sec; Left: 56.37 sec Met - 05/26/33: 39.47 sec w/ LUE  4 Pt will demonstrate ability to retrieve a lightweight object at moderate range to demonstrate increased LUE strength and ROM as need to complete ADLs and IADLs. Baseline: Grip strength: Right: 29 lbs; Left: 24 lbs Met    LONG TERM GOALS: Target date: 06/25/22   LTG  Status:  1 Pt will be able to utilize LUE at non-dominant level to engage in self-care tasks without assistance. Baseline:  Progressing  2 Pt will demonstrate improved UE functional use for ADLs as evidenced by increasing box and blocks score by 5 blocks with LUE Baseline: Right 42 blocks, Left 33 blocks Progressing - 05/26/22: 35 blocks w/ LUE  3 Pt will increase 9 hole peg test by 15 seconds with LUE to demonstrate increased coordination at needed to complete ADLs and IADLs.  Baseline: Right:  35.41 sec; Left: 56.37 sec Met - 05/26/22: 39.47 sec w/ LUE  4 Pt will demonstrate/report improvements in functional mobility and ADLs as reported on discharge functional status on FOTO by scoring >/= to 69. Baseline: 10 Progressing  5 Pt will be able to engage in IADLs with improved safety awareness, body positioning, and use of LUE as non-dominant level. Baseline:  Progressing    ASSESSMENT:  CLINICAL IMPRESSION: Pt continues to progress slowly toward goals w/ OT focusing session on review of importance of completing HEP at home and continued NMR of LUE ROM, coordination, and strength. Pt continues to incorporate significant trunk extension and lateral learning to compensate for UE/shoulder weakness, but demonstrated fair carryover within session with varying levels of cues and facilitation from therapist. Pt benefits from multimodal cues, especially use of demonstration and visual feedback for success and assist w/ sequencing. Pt has met all STGs at this time and independently verbalized understanding of putty exercises and coordination HEP. Will benefit from further assessment of HEP exercises targeting UB strength and coordination and progressing to address BADL and IADL participation and safety in future sessions.  PERFORMANCE DEFICITS in functional skills including ADLs, IADLs, coordination, dexterity, ROM, strength, pain, flexibility, FMC, GMC, mobility, balance, endurance, decreased knowledge of use of DME, and UE functional use.  IMPAIRMENTS are limiting patient from ADLs and IADLs.   COMORBIDITIES may have co-morbidities  that affects occupational performance. Patient will benefit from skilled OT to address above impairments and improve overall function.   PLAN: OT FREQUENCY: 2x/week  OT DURATION: 8 weeks  PLANNED INTERVENTIONS: self care/ADL training, therapeutic exercise, therapeutic activity, neuromuscular re-education, manual therapy, passive range of motion, balance training,  functional mobility training, electrical stimulation, ultrasound, moist heat, cryotherapy, patient/family education, visual/perceptual remediation/compensation, energy conservation, and DME and/or AE instructions  RECOMMENDED OTHER SERVICES: NA  CONSULTED AND AGREED WITH PLAN OF CARE: Patient  PLAN FOR NEXT SESSION: Review UB HEP exercises for carryover; reiterate TTB (simulated transfer) and provide handout for self-purchase if successful; AE for LB bathing/dressing; postural control and body mechanics during exercises and ADLs   HOXIE, Smyer, OTR/L 06/16/2022, 2:23 PM

## 2022-06-16 NOTE — Therapy (Signed)
OUTPATIENT SPEECH LANGUAGE PATHOLOGY TREATMENT NOTE   Patient Name: Pamela Cox MRN: [id number] DOB:Dec 06, 1940, 81 y.o., female Today's Date: 06/16/2022  PCP: Loann Quill, MD REFERRING PROVIDER: Jerold Coombe, DO  END OF SESSION:   End of Session - 06/16/22 1737     Visit Number 11    Number of Visits 23    Date for SLP Re-Evaluation 07/22/22    SLP Start Time 1317    SLP Stop Time  1400    SLP Time Calculation (min) 43 min    Activity Tolerance Patient tolerated treatment well                 Past Medical History:  Diagnosis Date   Arthritis    CML (chronic myelocytic leukemia) ([place])    CVA (cerebral vascular accident) ([place])    Diabetes mellitus without complication ([place])    GERD (gastroesophageal reflux disease)    Hypertension    Myelocytic leukemia, chronic ([place])    Obesity    Sleep apnea    Past Surgical History:  Procedure Laterality Date   APPENDECTOMY     COLONOSCOPY     COLONOSCOPY WITH PROPOFOL N/A 07/16/2019   Procedure: COLONOSCOPY WITH PROPOFOL;  Surgeon: Lollie Sails, MD;  Location: [facility] ENDOSCOPY;  Service: Endoscopy;  Laterality: N/A;   ESOPHAGOGASTRODUODENOSCOPY (EGD) WITH PROPOFOL N/A 07/16/2019   Procedure: ESOPHAGOGASTRODUODENOSCOPY (EGD) WITH PROPOFOL;  Surgeon: Lollie Sails, MD;  Location: [facility] ENDOSCOPY;  Service: Endoscopy;  Laterality: N/A;   TOTAL SHOULDER REPLACEMENT Right 2021   TUBAL LIGATION     Patient Active Problem List   Diagnosis Date Noted   Allergic rhinitis due to pollen 08/23/2018   DDD (degenerative disc disease), lumbar 08/23/2018   Lumbar neuritis 08/23/2018   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   GERD (gastroesophageal reflux disease) 08/23/2018   Morbid obesity ([place]) 08/23/2018   Obstructive sleep apnea 08/23/2018   Intervertebral disc disorder with radiculopathy of lumbar region 08/23/2018   CML (chronic myelocytic leukemia) ([place]) 08/23/2018   Hx of myocardial perfusion scan  08/23/2018   Osteoarthritis 08/23/2018   Degenerative arthritis of hip 08/23/2018   Lumbar spondylosis 08/23/2018   SOB (shortness of breath) 08/23/2018   History of CVA (cerebrovascular accident) 08/23/2018   Essential hypertension 08/23/2018   Chronic right shoulder pain 08/23/2018   Complete tear of right rotator cuff 08/23/2018   Acute pain of left shoulder 08/23/2018   On antineoplastic chemotherapy 08/23/2018   Hyperlipidemia 08/23/2018   Neurogenic claudication due to lumbar spinal stenosis 08/23/2018   Vitamin D deficiency 08/23/2018    ONSET DATE: 04/13/22  REFERRING DIAG: Lacunar infarct, acute  THERAPY DIAG:  Dysarthria and anarthria  Cognitive communication deficit  Rationale for Evaluation and Treatment Rehabilitation  SUBJECTIVE: "Nobody really asks me to say it again, [name]. My sister understands me pretty good (on the phone) now."   PAIN:  Are you having pain? Yes   Pain scale: 5/10 Location: lower back    OBJECTIVE:  TODAY'S TREATMENT:  06/16/22: Family and friends cont able to understand pt and are not asking pt to repeat. SLP engaged pt in 3 separate conversation today, for 16, 15 and 10 minutes, about simple to mod complex topics. SLP req'd to provide occasionally min cues for rate reduction. She completed HEPs today without assistance. Pt agrees with SLP she can reduce to once/week for 2-3 more visits then d/c if things remain the same.  06/14/22: Family and friends are not asking pt to repeat. SLP  engaged pt in 2 separate conversation today, for 17 and 16 minutes, about topics related to family. SLP req'd to occasionally cue pt min A. Curt[place] reports that she is assisting her memory for appointments with her calendar, is taking her meds with dtr verifying she took meds appropriately but reports independence with this. She is satisfied with memory skills, as they are functional at this time and she is using some compensations successfully.   06/09/22: SLP engaged  in conversation for 15 minutes; SLP had to cue pt x3 to slow rate, and one nonverbal cue to slow her rate. Pt successful at this after SLP cue. Next conversation was 17 minutes and pt self-regulated her speech rate successfully - SLP only needed to provide one nonverbal cue.  SLP and pt discussed pt's memory for PT and OT exercises each day - SLP suggested she write (or have someone write for her) in her composition book each afternoon when she can perform her exercises the following day. The next morning she can look in her composition book to remind herself when she will perform her exercises.   06/08/22: Pt told SLP about last ST session ("S" statement). "I got  my composition book, so I don't have to keep up with every little note and where things are. I just write it down in the composition book." SLP reiterated pt needs to perform HEP consistently (6 days/week) in order to regain strength in the oral musculature (if droop/dysarthria due to decr'd muscle strength). Pt was independent with overarticulation HEP but req'd min A occasionally for strength HEP. Pt was modified independent/independent by session end. SLP strongly reiterated to pt she must speak slower to improve speech intelligibility, and provided homework for pt to practice this.    PROM - communication effectiveness survey, pt self-scored 24/32.   SLU Mental Status (SLUMS Examination)  Orientation: 3/3 Delayed Recall w/ Interference: 2/5 Numeric Calculation and Registration: 0/3 Immediate Recall w/ Interference (Generative naming): 2/3 Registration and Digit Span: 0/2 Visual Spatial/Exec Functioning: 2/6 Executive Functioning/Extrapolation:  0/8  Pt reported her drooling is the same, but also reports anterior spillage from R side. SLP suggested pt use straw to assist in decreasing labial spillage. SLP suggest adding cognitive goals for patient with focus on memory. Cont with current POC.       PATIENT EDUCATION: Education  details: see today's treatment, above Person educated: Patient  Education method: Explanation Education comprehension: verbalized understanding, returned demonstration, and needs further education         GOALS: Goals reviewed with patient? Yes   SHORT TERM GOALS: Target date: 05/28/2022     Pt will perform HEP for dysarthria compensation (overarticulation) with rare min A in 3 sessions Baseline:  Goal status: Not met   2.  Pt will perform HEP for dysarthria (oral motor strength) with rare min A in 3 sessions Baseline:  Goal status: Not met   3.  Pt will engage in 3 minutes simple conversation with 90%+ intelligibility in 2 sessions Baseline:  Goal status: Not met   4.  Pt will complete speech PROM in first two therapy sessions Baseline:  Goal status: Met   5.  Pt will develop memory compensation/s to assist with home tasks and IADLs (e.g., speech HEP, to-do or grocery list, med administration, etc) Baseline:  Goal status: Met   LONG TERM GOALS: Target date: 07/22/2022     Pt will perform HEP for dysarthria compensation (overarticulation) with modified independence or SBA in 3 sessions Baseline:  06-08-22, 06-14-22 Goal status: Met   2.  Pt will perform HEP for dysarthria (oral motor strength) with modified independence or SBA in 3 sessions Baseline:  06/14/22, 06/16/22 Goal status: Ongoing   3.  Pt will engage in 15 minutes simple-mod complex conversation with 95%+ intelligibility with compensations in 3 sessions Baseline: 06-09-22, 06/16/22 Goal status: Met   4.  Pt will improve score on speech PROM taken in her last two therapy sessions Baseline:  Goal status: Ongoing  5.   Pt will report use of/use memory compensation/s to assist with home tasks and IADLs in 5 sessions Baseline: 06/08/22 Goal status: Deferred, satisfied with progress   ASSESSMENT:   CLINICAL IMPRESSION: Patient is a 81 y.o. female with diagnosis of dysarthria. See tx note. Pt spontaneously used  compensations for speech clarity in conversation more frequently today. She req'd more cues with oral motor strength HEP than with overarticulation HEP. SLP and pt discussed pt's progress today -  family and friends are not asking pt to repeat, and pt is satisfied with memory skills at this time. Pt thinks she would like to cont x1/week for no more than 4 more visits. SLP agreed. Pt will benefit from skilled ST targeting dysarthria.    OBJECTIVE IMPAIRMENTS include memory, and dysarthria. These impairments are limiting patient from household responsibilities. Factors affecting potential to achieve goals and functional outcome are previous level of function.. Patient will benefit from skilled SLP services to address above impairments and improve overall function.   REHAB POTENTIAL: Good   PLAN: SLP FREQUENCY: 2x/week   SLP DURATION: 12 weeks   PLANNED INTERVENTIONS: Environmental controls, Cueing hierachy, Cognitive reorganization, Internal/external aids, Oral motor exercises, Functional tasks, Multimodal communication approach, SLP instruction and feedback, Compensatory strategies, and Patient/family education       [facility], CCC-SLP 06/16/2022, 5:38 PM

## 2022-06-21 ENCOUNTER — Telehealth: Payer: Self-pay | Admitting: Occupational Therapy

## 2022-06-21 NOTE — Telephone Encounter (Signed)
Good morning Mychal Adrinne, Sze is being seen by Occupational Therapy at our clinic and we have been discussing DME to increase he safety and independence at home.  Pt would benefit from  tub transfer bench and I know that sometimes insurances will assist in covering the cost if there is a prescription.  I believe this pt is in between PCP as she reports that her prior PCP has moved from this clinic.  Per chart review you have seen her in the interim.  If you feel comfortable providing a script for a tub transfer bench, that would be great.  Or feel free to pass this request along to the appropriate provider.   If you have further questions, please contact me at the number or fax number listed below.   Thank you, Simonne Come, MS, OTR/L  [place], [facility] [address] [place], [place]  [address] Phone:  [phone] Fax:  [phone]

## 2022-06-22 ENCOUNTER — Ambulatory Visit: Payer: Medicare HMO

## 2022-06-22 ENCOUNTER — Ambulatory Visit: Payer: Medicare HMO | Attending: Neurology | Admitting: Occupational Therapy

## 2022-06-22 DIAGNOSIS — M6281 Muscle weakness (generalized): Secondary | ICD-10-CM | POA: Insufficient documentation

## 2022-06-22 DIAGNOSIS — M5441 Lumbago with sciatica, right side: Secondary | ICD-10-CM | POA: Diagnosis present

## 2022-06-22 DIAGNOSIS — R2681 Unsteadiness on feet: Secondary | ICD-10-CM

## 2022-06-22 DIAGNOSIS — R471 Dysarthria and anarthria: Secondary | ICD-10-CM | POA: Diagnosis present

## 2022-06-22 DIAGNOSIS — M5442 Lumbago with sciatica, left side: Secondary | ICD-10-CM | POA: Diagnosis present

## 2022-06-22 DIAGNOSIS — R278 Other lack of coordination: Secondary | ICD-10-CM | POA: Diagnosis present

## 2022-06-22 DIAGNOSIS — G8929 Other chronic pain: Secondary | ICD-10-CM | POA: Diagnosis present

## 2022-06-22 DIAGNOSIS — R262 Difficulty in walking, not elsewhere classified: Secondary | ICD-10-CM | POA: Insufficient documentation

## 2022-06-22 DIAGNOSIS — R41841 Cognitive communication deficit: Secondary | ICD-10-CM | POA: Insufficient documentation

## 2022-06-22 DIAGNOSIS — I69354 Hemiplegia and hemiparesis following cerebral infarction affecting left non-dominant side: Secondary | ICD-10-CM | POA: Insufficient documentation

## 2022-06-22 NOTE — Therapy (Addendum)
OUTPATIENT OCCUPATIONAL THERAPY TREATMENT NOTE  Patient Name: Pamela Cox MRN: [id number] DOB:04-21-1941, 81 y.o., female Today's Date: 06/22/2022  PCP: [facility] REFERRING PROVIDER: Mabeline Caras, DO    OCCUPATIONAL THERAPY DISCHARGE SUMMARY  Visits from Start of Care: 12  Current functional level related to goals / functional outcomes: Pt has met 4 of 5 LTGs.  Pt is utilizing LUE at diminished to non-dominant level with all ADLs and IADLs and has shown improvements in gross and fine motor coordination and improved shoulder ROM.     Remaining deficits: Pt continues to demonstrate non-dominant LUE decreased shoulder ROM and strength.    Education / Equipment: Provided on education for LUE shoulder strengthening and ROM HEP, recommendation for purchase of TTB for increased safety and independence with bathing at shower level, and recommendation for online vs community exercise program.   Patient agrees to discharge. Patient goals were met. Patient is being discharged due to meeting the stated rehab goals. And being pleased with current functional level.         OT End of Session - 06/22/22 1323     Visit Number 12    Number of Visits 17    Date for OT Re-Evaluation 06/25/22    Authorization Type [employer] Medicare    OT Start Time 1320    OT Stop Time 1404    OT Time Calculation (min) 44 min    Activity Tolerance Patient tolerated treatment well    Behavior During Therapy WFL for tasks assessed/performed               Past Medical History:  Diagnosis Date   Arthritis    CML (chronic myelocytic leukemia) (HCC)    CVA (cerebral vascular accident) (HCC)    Diabetes mellitus without complication (HCC)    GERD (gastroesophageal reflux disease)    Hypertension    Myelocytic leukemia, chronic (HCC)    Obesity    Sleep apnea    Past Surgical History:  Procedure Laterality Date   APPENDECTOMY     COLONOSCOPY     COLONOSCOPY WITH PROPOFOL N/A  07/16/2019   Procedure: COLONOSCOPY WITH PROPOFOL;  Surgeon: Christena Deem, MD;  Location: [facility] ENDOSCOPY;  Service: Endoscopy;  Laterality: N/A;   ESOPHAGOGASTRODUODENOSCOPY (EGD) WITH PROPOFOL N/A 07/16/2019   Procedure: ESOPHAGOGASTRODUODENOSCOPY (EGD) WITH PROPOFOL;  Surgeon: Christena Deem, MD;  Location: [facility] ENDOSCOPY;  Service: Endoscopy;  Laterality: N/A;   TOTAL SHOULDER REPLACEMENT Right 2021   TUBAL LIGATION     Patient Active Problem List   Diagnosis Date Noted   Allergic rhinitis due to pollen 08/23/2018   DDD (degenerative disc disease), lumbar 08/23/2018   Lumbar neuritis 08/23/2018   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   GERD (gastroesophageal reflux disease) 08/23/2018   Morbid obesity (HCC) 08/23/2018   Obstructive sleep apnea 08/23/2018   Intervertebral disc disorder with radiculopathy of lumbar region 08/23/2018   CML (chronic myelocytic leukemia) (HCC) 08/23/2018   Hx of myocardial perfusion scan 08/23/2018   Osteoarthritis 08/23/2018   Degenerative arthritis of hip 08/23/2018   Lumbar spondylosis 08/23/2018   SOB (shortness of breath) 08/23/2018   History of CVA (cerebrovascular accident) 08/23/2018   Essential hypertension 08/23/2018   Chronic right shoulder pain 08/23/2018   Complete tear of right rotator cuff 08/23/2018   Acute pain of left shoulder 08/23/2018   On antineoplastic chemotherapy 08/23/2018   Hyperlipidemia 08/23/2018   Neurogenic claudication due to lumbar spinal stenosis 08/23/2018   Vitamin D deficiency 08/23/2018  ONSET DATE: 04/12/22  REFERRING DIAG: I63.81 (ICD-10-CM) - Other cerebral infarction due to occlusion or stenosis of small artery   THERAPY DIAG:  Muscle weakness (generalized)  Unsteadiness on feet  Hemiplegia and hemiparesis following cerebral infarction affecting left non-dominant side (HCC)  Other lack of coordination  Rationale for Evaluation and Treatment Rehabilitation  SUBJECTIVE:   SUBJECTIVE  STATEMENT: Pt reports "I brought a paper for you" (in regards to her PCP and next appt) Pt accompanied by: self  PAIN: Are you having pain? No  PERTINENT HISTORY: R lacunar infarct w/ L-sided weakness; PMH includes CML, prior CVA (no residual deficits) and TIA, peripheral arterial disease, GERD, and HTN  PRECAUTIONS: Fall  PATIENT GOALS: to have more strength and walk better  OBJECTIVE:    FUNCTIONAL OUTCOME MEASURES: FOTO: 51 06/22/22: 61   UPPER EXTREMITY ROM      Active ROM Right eval Left eval Left 06/22/22  Shoulder flexion 110 (shoulder replacement) 125 133  Shoulder abduction       Shoulder adduction       Shoulder extension       Shoulder internal rotation WNL Slight decreased ROM   Shoulder external rotation WNL Slight decreased ROM   Elbow flexion WNL WNL   Elbow extension WNL WNL   Wrist flexion       Wrist extension       Wrist ulnar deviation       Wrist radial deviation       Wrist pronation       Wrist supination       (Blank rows = not tested)     UPPER EXTREMITY MMT:      MMT Right eval Left eval Left 06/22/22  Shoulder flexion 4/5 3/5 3+/5  Shoulder abduction       Shoulder adduction       Shoulder extension       Shoulder internal rotation       Shoulder external rotation       Middle trapezius       Lower trapezius       Elbow flexion 4+/5 4/5 4/5  Elbow extension 4+/5 4/5 4/5  Wrist flexion       Wrist extension       Wrist ulnar deviation       Wrist radial deviation       Wrist pronation       Wrist supination       (Blank rows = not tested)   HAND FUNCTION: Loose gross grasp, will need to complete more formal assessment during next session.   COORDINATION: 9 Hole Peg test: Right: 35.41 sec; Left: 56.37 sec 05/26/22: 39.47 sec w/ LUE Box and Blocks:  Right 42 blocks, Left 33 blocks 06/22/22: Left: 38 blocks   TODAY'S TREATMENT - 06/22/22: Reviewed HEP with focus on shoulder strengthening and ROM.  Pt continues to benefit from  multimodal cues for improved technique.  Therapist reiterated use of computer based exercise programs and use of Medbridge videos from HEP for improved carryover of exercises.  Pt reports currently completing ~2-3x/ week.   Engaged in functional tasks with focus on incorporation of LUE with folding clothing and tying shoes.  Pt demonstrating good use of LUE during task.  Pt demonstrating increased shoulder flexion with ability to reach into moderate height cabinet, however still requires some compensation for decreased ROM with lateral trunk lean. Reviewed modifications and adapted strategies with self-care tasks.  Reviewed strategies for fastening and donning bra  to compensate for reaching ROM bilaterally.  Reviewed recommendation for TTB with pt and pt's son, Myrle Sheng.  Provided picture and discussed purchase options.  Therapist did reach out to pt's primary care for assist in obtaining DME, as appropriate.    PATIENT EDUCATION: Ongoing encouragement for use of LUE during functional and structured tasks at home. Also reviewed safety strategies during ADLs and functional mobility at home, including discussion of potential benefit of TTB  Fall prevention w/ TTB, reacher Person educated: Patient Education method: Customer service manager Education comprehension: verbalized understanding, returned demonstration, and needs further education   HOME EXERCISE PROGRAM: Coordination HEP - see pt instructions; MedBridge Code OT1: [id number] - Putty squeezes - Rolling and pinching along length of putty - 3-point pinch - Tip pinch rolling balls w/ putty - Marble pick up from putty [place]: [id number] - Shoulder Flexion with Dowel to 90  - 4-5 x weekly - 2 sets - 10 reps - Chest Press with Bar  - 4-5 x weekly - 2 sets - 10 reps - Shoulder External Rotation with Dumbbell  - 4-5 x weekly - 2 sets - 10 reps - Seated Dumbbell Rows  - 4-5 x weekly - 2 sets - 10 reps - Shoulder Front Raise with  Resistance  - 4-5 x weekly - 2 sets - 10 reps   GOALS: Goals reviewed with patient? Yes  SHORT TERM GOALS: Target date: 05/28/22   STG  Status:  1 Pt will be independent with HEP for [facility] to increase coordination as needed for ADLs and IADLs. Baseline:  Met - 06/09/22  2 Pt will verbalize understanding of adapted strategies to maximize safety and I with ADLs/IADLs. Baseline:  Met - 06/09/22  3 Pt will increase 9 hole peg test by 8 seconds with LUE to demonstrate increased coordination at needed to complete ADLs and IADLs.  Baseline: Right: 35.41 sec; Left: 56.37 sec Met - 05/26/33: 39.47 sec w/ LUE  4 Pt will demonstrate ability to retrieve a lightweight object at moderate range to demonstrate increased LUE strength and ROM as need to complete ADLs and IADLs. Baseline: Grip strength: Right: 29 lbs; Left: 24 lbs Met    LONG TERM GOALS: Target date: 06/25/22   LTG  Status:  1 Pt will be able to utilize LUE at non-dominant level to engage in self-care tasks without assistance. Baseline:  Met  2 Pt will demonstrate improved UE functional use for ADLs as evidenced by increasing box and blocks score by 5 blocks with LUE Baseline: Right 42 blocks, Left 33 blocks Met -  06/22/22: 38 blocks with LUE  3 Pt will increase 9 hole peg test by 15 seconds with LUE to demonstrate increased coordination at needed to complete ADLs and IADLs.  Baseline: Right: 35.41 sec; Left: 56.37 sec Met - 05/26/22: 39.47 sec w/ LUE  4 Pt will demonstrate/report improvements in functional mobility and ADLs as reported on discharge functional status on FOTO by scoring >/= to 69. Baseline: 51 Progressing - 61 on 06/22/22  5 Pt will be able to engage in IADLs with improved safety awareness, body positioning, and use of LUE as non-dominant level. Baseline:  Met    ASSESSMENT:  CLINICAL IMPRESSION: Pt continues to show progress in functional use of LUE during ADLs, IALDs, and gross and fine motor control tasks.  Pt continues to  demonstrate decreased shoulder ROM and strength, however is reporting and demonstrating increased engagement and independence in ADLs and IADLs.  Pt continues to  be limited by back pain, R shoulder limited ROM due to shoulder replacement as well as LUE/shoulder weakness.  Pt benefits from multimodal cues, especially use of demonstration, facilitation of proper technique, and visual feedback for increased engagement in HEP and other ROM and strengthening tasks. Pt would benefit from engaging in a community day program or other full body exercise program such as Silver Sneakers or chair exercise programs for increased engagement and carryover of strengthening exercise program. Pt to d/c from OT services at this time due to reports of overwhelmed with multiple therapies, other appointments, and meeting 4 of 5 OT goals.  Pt and son in agreement of recommendations for exercise program/community resources, TTB for safety with transfers, continued PT and SLP services, and to re-consult OT if needed in the future.  PERFORMANCE DEFICITS in functional skills including ADLs, IADLs, coordination, dexterity, ROM, strength, pain, flexibility, FMC, GMC, mobility, balance, endurance, decreased knowledge of use of DME, and UE functional use.  IMPAIRMENTS are limiting patient from ADLs and IADLs.   COMORBIDITIES may have co-morbidities  that affects occupational performance. Patient will benefit from skilled OT to address above impairments and improve overall function.   PLAN: OT FREQUENCY: 2x/week  OT DURATION: 8 weeks  PLANNED INTERVENTIONS: self care/ADL training, therapeutic exercise, therapeutic activity, neuromuscular re-education, manual therapy, passive range of motion, balance training, functional mobility training, electrical stimulation, ultrasound, moist heat, cryotherapy, patient/family education, visual/perceptual remediation/compensation, energy conservation, and DME and/or AE instructions  RECOMMENDED  OTHER SERVICES: NA  CONSULTED AND AGREED WITH PLAN OF CARE: Patient  PLAN FOR NEXT SESSION: D/C from OT services at this time.   Simonne Come, OTR/L 06/22/2022, 2:12 PM

## 2022-06-22 NOTE — Therapy (Signed)
OUTPATIENT PHYSICAL THERAPY  NOTE   Patient Name: Pamela Cox MRN: [id number] DOB:10/14/1941, 81 y.o., female Today's Date: 06/22/2022   PCP: [facility] REFERRING PROVIDER: Kalman Drape, DO     PT End of Session - 06/22/22 1405     Visit Number 12    Number of Visits 17    Date for PT Re-Evaluation 07/07/22    Authorization Type Aetna Medicare    PT Start Time 1410    PT Stop Time 1455    PT Time Calculation (min) 45 min    Activity Tolerance Patient tolerated treatment well    Behavior During Therapy WFL for tasks assessed/performed                Past Medical History:  Diagnosis Date   Arthritis    CML (chronic myelocytic leukemia) ([place])    CVA (cerebral vascular accident) ([place])    Diabetes mellitus without complication ([place])    GERD (gastroesophageal reflux disease)    Hypertension    Myelocytic leukemia, chronic ([place])    Obesity    Sleep apnea    Past Surgical History:  Procedure Laterality Date   APPENDECTOMY     COLONOSCOPY     COLONOSCOPY WITH PROPOFOL N/A 07/16/2019   Procedure: COLONOSCOPY WITH PROPOFOL;  Surgeon: Lollie Sails, MD;  Location: [facility] ENDOSCOPY;  Service: Endoscopy;  Laterality: N/A;   ESOPHAGOGASTRODUODENOSCOPY (EGD) WITH PROPOFOL N/A 07/16/2019   Procedure: ESOPHAGOGASTRODUODENOSCOPY (EGD) WITH PROPOFOL;  Surgeon: Lollie Sails, MD;  Location: [facility] ENDOSCOPY;  Service: Endoscopy;  Laterality: N/A;   TOTAL SHOULDER REPLACEMENT Right 2021   TUBAL LIGATION     Patient Active Problem List   Diagnosis Date Noted   Allergic rhinitis due to pollen 08/23/2018   DDD (degenerative disc disease), lumbar 08/23/2018   Lumbar neuritis 08/23/2018   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   GERD (gastroesophageal reflux disease) 08/23/2018   Morbid obesity ([place]) 08/23/2018   Obstructive sleep apnea 08/23/2018   Intervertebral disc disorder with radiculopathy of lumbar region 08/23/2018   CML (chronic myelocytic leukemia)  ([place]) 08/23/2018   Hx of myocardial perfusion scan 08/23/2018   Osteoarthritis 08/23/2018   Degenerative arthritis of hip 08/23/2018   Lumbar spondylosis 08/23/2018   SOB (shortness of breath) 08/23/2018   History of CVA (cerebrovascular accident) 08/23/2018   Essential hypertension 08/23/2018   Chronic right shoulder pain 08/23/2018   Complete tear of right rotator cuff 08/23/2018   Acute pain of left shoulder 08/23/2018   On antineoplastic chemotherapy 08/23/2018   Hyperlipidemia 08/23/2018   Neurogenic claudication due to lumbar spinal stenosis 08/23/2018   Vitamin D deficiency 08/23/2018    ONSET DATE: 04/13/22  REFERRING DIAG: I63.81 (ICD-10-CM) - Other cerebral infarction due to occlusion or stenosis of small artery   THERAPY DIAG:  Muscle weakness (generalized)  Unsteadiness on feet  Difficulty in walking, not elsewhere classified  Rationale for Evaluation and Treatment Rehabilitation  SUBJECTIVE:  SUBJECTIVE STATEMENT: Feeling pretty good Pt accompanied by: self  PERTINENT HISTORY: Chronic Myelocytic leukemia, prior MCA CVA (no residual deficits) and TIA, peripheral arterial disease, GERD, HTN, DMII, R TSA 2021  PAIN:  Are you having pain? Yes: NPRS scale: 3-4/10 Pain location: LBP Pain description: dull Aggravating factors: all movements Relieving factors: Gabapentin/moist heat  PRECAUTIONS: Fall  PATIENT GOALS work on strengthening and speech  OBJECTIVE:   TODAY'S TREATMENT: 06/22/22 Activity Comments  LAQ 3x10 4#  Sidestepping x 2 min 4#, experiencing right side back/buttock pain with prolonged activity requiring seated break  Standing hamstring curls 2x10 4#, elbows propped on counter to accommodate L-spine  Standing hip abd 2x10 4#, elbows propped on counter to  accommodate L-spine  Gait training Throughout busy environment using rollator x 160 ft, stair negotiation w/ left HR and single point cane and CGA, cues in sequence  Standing foot on step 3x15 sec 6" step, requiring UE support with LLE stance  Alt. Stair taps 2x10 6" box BUE support  Standing on foam EO 2x30 sec EC 2x15 sec  Tandem stance 2x15 sec needing unilat UE support  Stepping over bolster Forwards/backwards/lateral 5x      TODAY'S TREATMENT: 06/16/22 Activity Comments  Nustep L5 x 6 min UE/LEs Maintaining ~80SPM  STS from elevated mat with no UEs Good ability and effort to control descent   ball squeeze 10x5" Cues to avoid valsalva  step ups 5x each Cueing to avoid heavy handrail use and forward trunk lean  reciprocal stair navigation 6x With B handrail; encouraged alternating reciprocal pattern ascending, step-to descending with CGA      HOME EXERCISE PROGRAM Last updated: 05/12/22  Access Code: [email or web address] URL: [email or web address] Date: 05/12/2022 Prepared by: [facility]  Exercises - Heel Toe Raises with Counter Support  - 1 x daily - 5 x weekly - 2 sets - 10 reps - Sit to Stand with Counter Support  - 1 x daily - 5 x weekly - 2 sets - 10 reps - Side Stepping with Counter Support  - 1 x daily - 5 x weekly - 2 sets - 10 reps - Standing Gastroc Stretch at Counter  - 1 x daily - 5 x weekly - 2 sets - 30 sec hold - Seated Flexion Stretch with Swiss Ball  - 1 x daily - 5 x weekly - 2 sets - 10 reps - 5 sec hold - Seated Thoracic Flexion and Rotation with Swiss Ball  - 1 x daily - 5 x weekly - 2 sets - 10 reps - 5 sec hold - Supine Lower Trunk Rotation  - 1 x daily - 5 x weekly - 2 sets - 10 reps - Seated Piriformis Stretch  - 1 x daily - 5 x weekly - 2 sets - 30 sec hold - Seated Figure 4 Piriformis Stretch  - 1 x daily - 5 x weekly - 2 sets - 30 sec hold  Below measures were taken at time of initial evaluation unless  otherwise specified:  DIAGNOSTIC FINDINGS: MR without contrast (04/13/22) Acute, small lacunar infarct in posterior limb of the right internal capsule. Sequelae of remote left parietal lobe infarct.  CT Brain without contrast (04/13/22) Unchanged background of chronic microvascular ischemic white matter changes and scattered remote small vessel infarcts.  COGNITION: Overall cognitive status: No family/caregiver present to determine baseline cognitive functioning   SENSATION: WFL  COORDINATION: Alt pronation/supination slightly dysmetric on L; B heel to shin limited  by strength/ROM   MUSCLE TONE: normal in B LEs  POSTURE: rounded shoulders, forward head, increased thoracic kyphosis, and posterior pelvic tilt  LOWER EXTREMITY ROM:     Active  Right Eval Left Eval  Hip flexion    Hip extension    Hip abduction    Hip adduction    Hip internal rotation    Hip external rotation    Knee flexion    Knee extension    Ankle dorsiflexion 10 5  Ankle plantarflexion    Ankle inversion    Ankle eversion     (Blank rows = not tested)  LOWER EXTREMITY MMT:    MMT (in sitting) Right Eval Left Eval  Hip flexion 4+ 4+  Hip extension    Hip abduction 4 4-  Hip adduction 4 4  Hip internal rotation    Hip external rotation    Knee flexion 4 4  Knee extension 4+ 4  Ankle dorsiflexion 4 4-  Ankle plantarflexion 4 4  Ankle inversion    Ankle eversion    (Blank rows = not tested)   GAIT: Gait pattern: step to pattern, step through pattern, decreased step length- Right, decreased stance time- Left, and trunk flexed Assistive device utilized: Environmental consultant - 2 wheeled Level of assistance: Modified independence   FUNCTIONAL TESTs:  5 times sit to stand: 17.36 sec with B UE support from 21 inch seat height Timed up and go (TUG): 27/77 sec with RW; imbalance and trouble staying within confines of walker during turns [employer] Scale: 30/56 indicating high risk for falls  PATIENT  SURVEYS:  FOTO [phone]    PATIENT EDUCATION: Education details: prognosis, POC, HEP Person educated: Patient Education method: Explanation, Demonstration, Tactile cues, Verbal cues, and Handouts Education comprehension: verbalized understanding   HOME EXERCISE PROGRAM: Access Code: [email or web address] URL: [email or web address] Date: 04/28/2022 Prepared by: [facility]  Exercises - Heel Toe Raises with Counter Support  - 1 x daily - 5 x weekly - 2 sets - 10 reps - Sit to Stand with Counter Support  - 1 x daily - 5 x weekly - 2 sets - 10 reps - Side Stepping with Counter Support  - 1 x daily - 5 x weekly - 2 sets - 10 reps - Standing Gastroc Stretch at Counter  - 1 x daily - 5 x weekly - 2 sets - 30 sec hold    GOALS: Goals reviewed with patient? Yes  SHORT TERM GOALS: Target date: 05/19/2022  Patient to be independent with initial HEP. Baseline: HEP initiated Goal status: MET 06/09/22    LONG TERM GOALS: Target date: 07/07/2022  Patient to be independent with advanced HEP. Baseline: Not yet initiated  Goal status: IN PROGRESS  Patient to demonstrate B LE strength >/=4+/5.  Baseline: See above; 06/09/22 improved see above Goal status: IN PROGRESS  Patient to demonstrate at least 10 deg L ankle dorsiflexion AROM.[email or web address] Kitchen  Baseline: 5 deg Goal status: MET  Patient to demonstrate alternating reciprocal pattern when ascending and descending stairs with good stability and 1 handrail as needed.   Baseline: NT; 06/09/22 able to perform with heavy B UE reliance Goal status: IN PROGRESS  Patient to complete TUG in <14 sec with LRAD in order to decrease risk of falls.   Baseline: 27.77 sec with RW; 06/09/22 22.81 sec Goal status: IN PROGRESS  Patient to demonstrate 5xSTS test in <15 sec in order to decrease risk of falls.  Baseline: 17.36  sec with B UE support from 21 inch seat height; 06/09/22 20.11 sec from standard chair height Goal  status: IN PROGRESS  Patient to score at least 45/56 on [place] in order to decrease risk of falls.  Baseline: 30/56 on 05/14/22 Goal status: IN PROGRESS   ASSESSMENT:  CLINICAL IMPRESSION: Progressed with strengthening exercises for standing OKC PRE using increased weight and improved return demonstration for isolated movements Standing/walking tolerance limited by right side LBP and radiating pain into buttocks requiring brief seated rest periods. Improved static standing on compliant surfaces with reduced need for UE support maintaining balance for 30 sec increments without LOB. Training in stair ambulation with single HR and cane and cues in sequence and safety as pt reports home environment has single HR on left ascending. Deficits in LLE stance stability and strength evident during balance activities and loading response during gait. Continued sessions to progress mobility and balance to reduce risk for falls    OBJECTIVE IMPAIRMENTS Abnormal gait, decreased balance, decreased coordination, decreased endurance, decreased mobility, difficulty walking, decreased ROM, decreased strength, decreased safety awareness, impaired flexibility, improper body mechanics, postural dysfunction, and pain.   ACTIVITY LIMITATIONS carrying, lifting, bending, sitting, standing, squatting, stairs, transfers, bed mobility, bathing, toileting, dressing, reach over head, hygiene/grooming, and locomotion level  PARTICIPATION LIMITATIONS: meal prep, cleaning, laundry, shopping, community activity, and church  PERSONAL FACTORS Age, Fitness, Past/current experiences, Time since onset of injury/illness/exacerbation, and 3+ comorbidities: Chronic Myelocytic leukemia, prior MCA CVA (no residual deficits) and TIA, peripheral arterial disease, GERD, HTN, DMII, R TSA 2021  are also affecting patient's functional outcome.   REHAB POTENTIAL: Good  CLINICAL DECISION MAKING: Evolving/moderate complexity  EVALUATION  COMPLEXITY: Moderate  PLAN: PT FREQUENCY: 1-2x/week  PT DURATION: 4 weeks  PLANNED INTERVENTIONS: Therapeutic exercises, Therapeutic activity, Neuromuscular re-education, Balance training, Gait training, Patient/Family education, Joint mobilization, Stair training, Vestibular training, Canalith repositioning, DME instructions, Aquatic Therapy, Dry Needling, Electrical stimulation, Cryotherapy, Moist heat, Taping, Manual therapy, and Re-evaluation  PLAN FOR NEXT SESSION:  Continue to work towards reciprocal stair navigation, continue trying gait with cane for possible use with stairs? progress LE strength and balance    3:04 PM, 06/22/22 M. Sherlyn Lees, PT, DPT Physical Therapist- [place] Office Number: [phone]   [place] [facility] [address] [place], [place] [address] Phone # [phone] Fax # [phone]

## 2022-06-24 ENCOUNTER — Ambulatory Visit: Payer: Medicare HMO | Admitting: Physical Therapy

## 2022-06-30 ENCOUNTER — Ambulatory Visit: Payer: Medicare HMO

## 2022-06-30 ENCOUNTER — Encounter: Payer: Medicare HMO | Admitting: Occupational Therapy

## 2022-06-30 DIAGNOSIS — M6281 Muscle weakness (generalized): Secondary | ICD-10-CM

## 2022-06-30 DIAGNOSIS — R262 Difficulty in walking, not elsewhere classified: Secondary | ICD-10-CM

## 2022-06-30 DIAGNOSIS — G8929 Other chronic pain: Secondary | ICD-10-CM

## 2022-06-30 DIAGNOSIS — R2681 Unsteadiness on feet: Secondary | ICD-10-CM

## 2022-06-30 DIAGNOSIS — R471 Dysarthria and anarthria: Secondary | ICD-10-CM

## 2022-06-30 NOTE — Therapy (Signed)
OUTPATIENT SPEECH LANGUAGE PATHOLOGY TREATMENT NOTE   Patient Name: Pamela Cox MRN: [id number] DOB:09-14-1941, 81 y.o., female Today's Date: 06/30/2022  PCP: Loann Quill, MD REFERRING PROVIDER: Jerold Coombe, DO  END OF SESSION:   End of Session - 06/30/22 1637     Visit Number 12    Number of Visits 23    Date for SLP Re-Evaluation 07/22/22    SLP Start Time 1617    SLP Stop Time  1700    SLP Time Calculation (min) 43 min    Activity Tolerance Patient tolerated treatment well                 Past Medical History:  Diagnosis Date   Arthritis    CML (chronic myelocytic leukemia) ([place])    CVA (cerebral vascular accident) ([place])    Diabetes mellitus without complication ([place])    GERD (gastroesophageal reflux disease)    Hypertension    Myelocytic leukemia, chronic ([place])    Obesity    Sleep apnea    Past Surgical History:  Procedure Laterality Date   APPENDECTOMY     COLONOSCOPY     COLONOSCOPY WITH PROPOFOL N/A 07/16/2019   Procedure: COLONOSCOPY WITH PROPOFOL;  Surgeon: Lollie Sails, MD;  Location: [facility] ENDOSCOPY;  Service: Endoscopy;  Laterality: N/A;   ESOPHAGOGASTRODUODENOSCOPY (EGD) WITH PROPOFOL N/A 07/16/2019   Procedure: ESOPHAGOGASTRODUODENOSCOPY (EGD) WITH PROPOFOL;  Surgeon: Lollie Sails, MD;  Location: [facility] ENDOSCOPY;  Service: Endoscopy;  Laterality: N/A;   TOTAL SHOULDER REPLACEMENT Right 2021   TUBAL LIGATION     Patient Active Problem List   Diagnosis Date Noted   Allergic rhinitis due to pollen 08/23/2018   DDD (degenerative disc disease), lumbar 08/23/2018   Lumbar neuritis 08/23/2018   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   GERD (gastroesophageal reflux disease) 08/23/2018   Morbid obesity ([place]) 08/23/2018   Obstructive sleep apnea 08/23/2018   Intervertebral disc disorder with radiculopathy of lumbar region 08/23/2018   CML (chronic myelocytic leukemia) ([place]) 08/23/2018   Hx of myocardial perfusion scan 08/23/2018    Osteoarthritis 08/23/2018   Degenerative arthritis of hip 08/23/2018   Lumbar spondylosis 08/23/2018   SOB (shortness of breath) 08/23/2018   History of CVA (cerebrovascular accident) 08/23/2018   Essential hypertension 08/23/2018   Chronic right shoulder pain 08/23/2018   Complete tear of right rotator cuff 08/23/2018   Acute pain of left shoulder 08/23/2018   On antineoplastic chemotherapy 08/23/2018   Hyperlipidemia 08/23/2018   Neurogenic claudication due to lumbar spinal stenosis 08/23/2018   Vitamin D deficiency 08/23/2018    ONSET DATE: 04/13/22  REFERRING DIAG: Lacunar infarct, acute  THERAPY DIAG:  Dysarthria and anarthria  Rationale for Evaluation and Treatment Rehabilitation  SUBJECTIVE: "Nobody really asks me to say it again, [name]. My sister understands me pretty good (on the phone) now."   PAIN:  Are you having pain? Yes   Pain scale: 5/10 Location: lower back    OBJECTIVE:  TODAY'S TREATMENT:  06/30/22: Mozel spontaneously used compensations for speech clarity in conversation frequently today. When she thought she was going too fast she reduced her rate 85% of the time. She req'd SBA with oral motor strength HEP and was WNL for overarticulation HEP - SLP and pt agreed 07/05/22 as pt's last day.  06/16/22: Family and friends cont able to understand pt and are not asking pt to repeat. SLP engaged pt in 3 separate conversation today, for 16, 15 and 10 minutes, about simple to mod complex  topics. SLP req'd to provide occasionally min cues for rate reduction. She completed HEPs today without assistance. Pt agrees with SLP she can reduce to once/week for 2-3 more visits then d/c if things remain the same.  06/14/22: Family and friends are not asking pt to repeat. SLP engaged pt in 2 separate conversation today, for 17 and 16 minutes, about topics related to family. SLP req'd to occasionally cue pt min A. Chrishawn reports that she is assisting her memory for appointments with her  calendar, is taking her meds with dtr verifying she took meds appropriately but reports independence with this. She is satisfied with memory skills, as they are functional at this time and she is using some compensations successfully.   06/09/22: SLP engaged in conversation for 15 minutes; SLP had to cue pt x3 to slow rate, and one nonverbal cue to slow her rate. Pt successful at this after SLP cue. Next conversation was 17 minutes and pt self-regulated her speech rate successfully - SLP only needed to provide one nonverbal cue.  SLP and pt discussed pt's memory for PT and OT exercises each day - SLP suggested she write (or have someone write for her) in her composition book each afternoon when she can perform her exercises the following day. The next morning she can look in her composition book to remind herself when she will perform her exercises.   06/08/22: Pt told SLP about last ST session ("S" statement). "I got  my composition book, so I don't have to keep up with every little note and where things are. I just write it down in the composition book." SLP reiterated pt needs to perform HEP consistently (6 days/week) in order to regain strength in the oral musculature (if droop/dysarthria due to decr'd muscle strength). Pt was independent with overarticulation HEP but req'd min A occasionally for strength HEP. Pt was modified independent/independent by session end. SLP strongly reiterated to pt she must speak slower to improve speech intelligibility, and provided homework for pt to practice this.    PROM - communication effectiveness survey, pt self-scored 24/32.   SLU Mental Status (SLUMS Examination)  Orientation: 3/3 Delayed Recall w/ Interference: 2/5 Numeric Calculation and Registration: 0/3 Immediate Recall w/ Interference (Generative naming): 2/3 Registration and Digit Span: 0/2 Visual Spatial/Exec Functioning: 2/6 Executive Functioning/Extrapolation:  0/8  Pt reported her drooling is  the same, but also reports anterior spillage from R side. SLP suggested pt use straw to assist in decreasing labial spillage. SLP suggest adding cognitive goals for patient with focus on memory. Cont with current POC.       PATIENT EDUCATION: Education details: see today's treatment, above Person educated: Patient  Education method: Explanation Education comprehension: verbalized understanding, returned demonstration, and needs further education         GOALS: Goals reviewed with patient? Yes   SHORT TERM GOALS: Target date: 05/28/2022     Pt will perform HEP for dysarthria compensation (overarticulation) with rare min A in 3 sessions Baseline:  Goal status: Not met   2.  Pt will perform HEP for dysarthria (oral motor strength) with rare min A in 3 sessions Baseline:  Goal status: Not met   3.  Pt will engage in 3 minutes simple conversation with 90%+ intelligibility in 2 sessions Baseline:  Goal status: Not met   4.  Pt will complete speech PROM in first two therapy sessions Baseline:  Goal status: Met   5.  Pt will develop memory compensation/s to assist  with home tasks and IADLs (e.g., speech HEP, to-do or grocery list, med administration, etc) Baseline:  Goal status: Met   LONG TERM GOALS: Target date: 07/22/2022     Pt will perform HEP for dysarthria compensation (overarticulation) with modified independence or SBA in 3 sessions Baseline:  06-08-22, 06-14-22 Goal status: Met   2.  Pt will perform HEP for dysarthria (oral motor strength) with modified independence or SBA in 3 sessions Baseline:  06/14/22, 06/16/22 Goal status: Met   3.  Pt will engage in 15 minutes simple-mod complex conversation with 95%+ intelligibility with compensations in 3 sessions Baseline: 06-09-22, 06/16/22 Goal status: Met   4.  Pt will improve score on speech PROM taken in her last two therapy sessions Baseline:  Goal status: Ongoing  5.   Pt will report use of/use memory compensation/s  to assist with home tasks and IADLs in 5 sessions Baseline: 06/08/22 Goal status: Deferred, satisfied with progress   ASSESSMENT:   CLINICAL IMPRESSION: Patient is a 81 y.o. female with diagnosis of dysarthria. See tx note. Pt spontaneously used compensations for speech clarity in conversation frequently today, she spontaneously slowed her speech when she knew she was articulating too quickly 85% of the time. She req'd rare SBA with oral motor strength HEP and was WNL for overarticulation HEP. SLP and pt discussed pt's progress today -  family and friends are not asking pt to repeat, and pt is satisfied with memory skills at this time. Pt and SLP agreed 07/05/22 would be appropriate for pt's last day.   OBJECTIVE IMPAIRMENTS include memory, and dysarthria. These impairments are limiting patient from household responsibilities. Factors affecting potential to achieve goals and functional outcome are previous level of function.. Patient will benefit from skilled SLP services to address above impairments and improve overall function.   REHAB POTENTIAL: Good   PLAN: SLP FREQUENCY: 2x/week   SLP DURATION: 12 weeks   PLANNED INTERVENTIONS: Environmental controls, Cueing hierachy, Cognitive reorganization, Internal/external aids, Oral motor exercises, Functional tasks, Multimodal communication approach, SLP instruction and feedback, Compensatory strategies, and Patient/family education       [facility], [place] 06/30/2022, 4:42 PM

## 2022-06-30 NOTE — Therapy (Signed)
OUTPATIENT PHYSICAL THERAPY  NOTE   Patient Name: Pamela Cox MRN: [id number] DOB:Mar 06, 1941, 81 y.o., female Today's Date: 06/30/2022   PCP: [facility] REFERRING PROVIDER: Kalman Drape, DO     PT End of Session - 06/30/22 1536     Visit Number 13    Number of Visits 17    Date for PT Re-Evaluation 07/07/22    Authorization Type [employer]    PT Start Time 1530    PT Stop Time 4076    PT Time Calculation (min) 45 min    Activity Tolerance Patient tolerated treatment well    Behavior During Therapy WFL for tasks assessed/performed                Past Medical History:  Diagnosis Date   Arthritis    CML (chronic myelocytic leukemia) ([place])    CVA (cerebral vascular accident) ([place])    Diabetes mellitus without complication ([place])    GERD (gastroesophageal reflux disease)    Hypertension    Myelocytic leukemia, chronic ([place])    Obesity    Sleep apnea    Past Surgical History:  Procedure Laterality Date   APPENDECTOMY     COLONOSCOPY     COLONOSCOPY WITH PROPOFOL N/A 07/16/2019   Procedure: COLONOSCOPY WITH PROPOFOL;  Surgeon: Lollie Sails, MD;  Location: [facility] ENDOSCOPY;  Service: Endoscopy;  Laterality: N/A;   ESOPHAGOGASTRODUODENOSCOPY (EGD) WITH PROPOFOL N/A 07/16/2019   Procedure: ESOPHAGOGASTRODUODENOSCOPY (EGD) WITH PROPOFOL;  Surgeon: Lollie Sails, MD;  Location: [facility] ENDOSCOPY;  Service: Endoscopy;  Laterality: N/A;   TOTAL SHOULDER REPLACEMENT Right 2021   TUBAL LIGATION     Patient Active Problem List   Diagnosis Date Noted   Allergic rhinitis due to pollen 08/23/2018   DDD (degenerative disc disease), lumbar 08/23/2018   Lumbar neuritis 08/23/2018   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   GERD (gastroesophageal reflux disease) 08/23/2018   Morbid obesity ([place]) 08/23/2018   Obstructive sleep apnea 08/23/2018   Intervertebral disc disorder with radiculopathy of lumbar region 08/23/2018   CML (chronic myelocytic leukemia)  ([place]) 08/23/2018   Hx of myocardial perfusion scan 08/23/2018   Osteoarthritis 08/23/2018   Degenerative arthritis of hip 08/23/2018   Lumbar spondylosis 08/23/2018   SOB (shortness of breath) 08/23/2018   History of CVA (cerebrovascular accident) 08/23/2018   Essential hypertension 08/23/2018   Chronic right shoulder pain 08/23/2018   Complete tear of right rotator cuff 08/23/2018   Acute pain of left shoulder 08/23/2018   On antineoplastic chemotherapy 08/23/2018   Hyperlipidemia 08/23/2018   Neurogenic claudication due to lumbar spinal stenosis 08/23/2018   Vitamin D deficiency 08/23/2018    ONSET DATE: 04/13/22  REFERRING DIAG: I63.81 (ICD-10-CM) - 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  Rationale for Evaluation and Treatment Rehabilitation  SUBJECTIVE:  SUBJECTIVE STATEMENT: Right side of back and leg are feeling better since injection.  The left side of the low back has been very sore though.  Pt accompanied by: self  PERTINENT HISTORY: Chronic Myelocytic leukemia, prior MCA CVA (no residual deficits) and TIA, peripheral arterial disease, GERD, HTN, DMII, R TSA 2021  PAIN:  Are you having pain? Yes: NPRS scale: 3-4/10 Pain location: LBP Pain description: dull Aggravating factors: all movements Relieving factors: Gabapentin/moist heat  PRECAUTIONS: Fall  PATIENT GOALS work on strengthening and speech  OBJECTIVE:   TODAY'S TREATMENT: 06/30/22 Activity Comments  NU-step x 6 min level 5 Dynamic warm-up and placed MHP to lumbar spine during activity  Left lumbar paraspinals and QL Tender to palpation and palpable knot/trigger point present. Soft tissue mobilization to area to reduce pain and trigger point to enable  improved gait tolerance  5xSTS Standard chair use of BUE 50% of the time, 23.22 sec. Poor eccentric control for descent  TUG test w/ RW 15.5 sec  Stair ambulation  BHR modified indep, step-to pattern x 12 stairs  Gait training Supervision-mod I level surfaces with RW for negotiation of obstacles and stepping up/down curbs and over obstacles  HEP review Able to recall 11% of previous HEP routine.  Patient reports non-compliance         HOME EXERCISE PROGRAM Last updated: 05/12/22  Access Code: [email or web address] URL: [email or web address] Date: 05/12/2022 Prepared by: [facility]  Exercises - Heel Toe Raises with Counter Support  - 1 x daily - 5 x weekly - 2 sets - 10 reps - Sit to Stand with Counter Support  - 1 x daily - 5 x weekly - 2 sets - 10 reps - Side Stepping with Counter Support  - 1 x daily - 5 x weekly - 2 sets - 10 reps - Standing Gastroc Stretch at Counter  - 1 x daily - 5 x weekly - 2 sets - 30 sec hold - Seated Flexion Stretch with Swiss Ball  - 1 x daily - 5 x weekly - 2 sets - 10 reps - 5 sec hold - Seated Thoracic Flexion and Rotation with Swiss Ball  - 1 x daily - 5 x weekly - 2 sets - 10 reps - 5 sec hold - Supine Lower Trunk Rotation  - 1 x daily - 5 x weekly - 2 sets - 10 reps - Seated Piriformis Stretch  - 1 x daily - 5 x weekly - 2 sets - 30 sec hold - Seated Figure 4 Piriformis Stretch  - 1 x daily - 5 x weekly - 2 sets - 30 sec hold  Below measures were taken at time of initial evaluation unless otherwise specified:  DIAGNOSTIC FINDINGS: MR without contrast (04/13/22) Acute, small lacunar infarct in posterior limb of the right internal capsule. Sequelae of remote left parietal lobe infarct.  CT Brain without contrast (04/13/22) Unchanged background of chronic microvascular ischemic white matter changes and scattered remote small vessel infarcts.  COGNITION: Overall cognitive status: No family/caregiver present to  determine baseline cognitive functioning   SENSATION: WFL  COORDINATION: Alt pronation/supination slightly dysmetric on L; B heel to shin limited by strength/ROM   MUSCLE TONE: normal in B LEs  POSTURE: rounded shoulders, forward head, increased thoracic kyphosis, and posterior pelvic tilt  LOWER EXTREMITY ROM:     Active  Right Eval Left Eval  Hip flexion    Hip extension    Hip abduction  Hip adduction    Hip internal rotation    Hip external rotation    Knee flexion    Knee extension    Ankle dorsiflexion 10 5  Ankle plantarflexion    Ankle inversion    Ankle eversion     (Blank rows = not tested)  LOWER EXTREMITY MMT:    MMT (in sitting) Right Eval Left Eval  Hip flexion 4+ 4+  Hip extension    Hip abduction 4 4-  Hip adduction 4 4  Hip internal rotation    Hip external rotation    Knee flexion 4 4  Knee extension 4+ 4  Ankle dorsiflexion 4 4-  Ankle plantarflexion 4 4  Ankle inversion    Ankle eversion    (Blank rows = not tested)   GAIT: Gait pattern: step to pattern, step through pattern, decreased step length- Right, decreased stance time- Left, and trunk flexed Assistive device utilized: Environmental consultant - 2 wheeled Level of assistance: Modified independence   FUNCTIONAL TESTs:  5 times sit to stand: 17.36 sec with B UE support from 21 inch seat height Timed up and go (TUG): 27/77 sec with RW; imbalance and trouble staying within confines of walker during turns [employer] Scale: 30/56 indicating high risk for falls  PATIENT SURVEYS:  FOTO [phone]    PATIENT EDUCATION: Education details: prognosis, POC, HEP Person educated: Patient Education method: Explanation, Demonstration, Tactile cues, Verbal cues, and Handouts Education comprehension: verbalized understanding   HOME EXERCISE PROGRAM: Access Code: [email or web address] URL: [email or web address] Date: 04/28/2022 Prepared by: [facility]  Exercises - Heel Toe Raises with Counter Support  - 1 x daily - 5 x weekly - 2 sets - 10 reps - Sit to Stand with Counter Support  - 1 x daily - 5 x weekly - 2 sets - 10 reps - Side Stepping with Counter Support  - 1 x daily - 5 x weekly - 2 sets - 10 reps - Standing Gastroc Stretch at Counter  - 1 x daily - 5 x weekly - 2 sets - 30 sec hold    GOALS: Goals reviewed with patient? Yes  SHORT TERM GOALS: Target date: 05/19/2022  Patient to be independent with initial HEP. Baseline: HEP initiated Goal status: MET 06/09/22    LONG TERM GOALS: Target date: 07/07/2022  Patient to be independent with advanced HEP. Baseline: Not yet initiated  Goal status: IN PROGRESS  Patient to demonstrate B LE strength >/=4+/5.  Baseline: See above; 06/09/22 improved see above Goal status: IN PROGRESS  Patient to demonstrate at least 10 deg L ankle dorsiflexion AROM.Marland Kitchen  Baseline: 5 deg Goal status: MET  Patient to demonstrate alternating reciprocal pattern when ascending and descending stairs with good stability and 1 handrail as needed.   Baseline: NT; 06/09/22 able to perform with heavy B UE reliance.  Modified independence BHR step-to pattern for descending Goal status: NOT MET  Patient to complete TUG in <14 sec with LRAD in order to decrease risk of falls.   Baseline: 27.77 sec with RW; 06/09/22 22.81 sec; (06/30/22) 15.5 sec w/ RW Goal status: NOT MET  Patient to demonstrate 5xSTS test in <15 sec in order to decrease risk of falls.  Baseline: 17.36 sec with B UE support from 21 inch seat height; 06/09/22 20.11 sec from standard chair height. (06/30/22) 23.22 sec BUE assist needed Goal status: NOT MET  Patient to score at least 45/56 on [place] in order to decrease  risk of falls.  Baseline: 30/56 on 05/14/22 Goal status: IN PROGRESS   ASSESSMENT:  CLINICAL IMPRESSION: Presents today with report of improved right lumbar radiculopathy symptoms since receiving injection in recent  history.  Left lumbar musculature reveals palpable and painful trigger point with soft-tissue mobilization performed separate from all other interventions in order to reduce pain/spasm/guarding which was met to good effect.  Performance of STG/LTG which reveal minimal change since start of care with 5xSTS and TUG test. Pt reports non-compliance with HEP and reports no barriers other than her will power to complete things at home.  Recommend she pursue a day-center for seniors for guided activity to improve activity participation and tolerance. Continued sessions to reinforce or revise HEP for improved compliance     OBJECTIVE IMPAIRMENTS Abnormal gait, decreased balance, decreased coordination, decreased endurance, decreased mobility, difficulty walking, decreased ROM, decreased strength, decreased safety awareness, impaired flexibility, improper body mechanics, postural dysfunction, and pain.   ACTIVITY LIMITATIONS carrying, lifting, bending, sitting, standing, squatting, stairs, transfers, bed mobility, bathing, toileting, dressing, reach over head, hygiene/grooming, and locomotion level  PARTICIPATION LIMITATIONS: meal prep, cleaning, laundry, shopping, community activity, and church  PERSONAL FACTORS Age, Fitness, Past/current experiences, Time since onset of injury/illness/exacerbation, and 3+ comorbidities: Chronic Myelocytic leukemia, prior MCA CVA (no residual deficits) and TIA, peripheral arterial disease, GERD, HTN, DMII, R TSA 2021  are also affecting patient's functional outcome.   REHAB POTENTIAL: Good  CLINICAL DECISION MAKING: Evolving/moderate complexity  EVALUATION COMPLEXITY: Moderate  PLAN: PT FREQUENCY: 1-2x/week  PT DURATION: 4 weeks  PLANNED INTERVENTIONS: Therapeutic exercises, Therapeutic activity, Neuromuscular re-education, Balance training, Gait training, Patient/Family education, Joint mobilization, Stair training, Vestibular training, Canalith repositioning, DME  instructions, Aquatic Therapy, Dry Needling, Electrical stimulation, Cryotherapy, Moist heat, Taping, Manual therapy, and Re-evaluation  PLAN FOR NEXT SESSION:  Continue to work towards reciprocal stair navigation, continue trying gait with cane for possible use with stairs? progress LE strength and balance    3:36 PM, 06/30/22 M. Sherlyn Lees, PT, DPT Physical Therapist- [place] Office Number: [phone]   [place] [facility] [address] [place], [place] [address] Phone # [phone] Fax # [phone]

## 2022-07-02 ENCOUNTER — Ambulatory Visit: Payer: Medicare HMO | Admitting: Physical Therapy

## 2022-07-02 ENCOUNTER — Encounter: Payer: Self-pay | Admitting: Physical Therapy

## 2022-07-02 DIAGNOSIS — R2681 Unsteadiness on feet: Secondary | ICD-10-CM

## 2022-07-02 DIAGNOSIS — M6281 Muscle weakness (generalized): Secondary | ICD-10-CM | POA: Diagnosis not present

## 2022-07-02 DIAGNOSIS — R262 Difficulty in walking, not elsewhere classified: Secondary | ICD-10-CM

## 2022-07-02 NOTE — Therapy (Signed)
OUTPATIENT PHYSICAL THERAPY  NOTE   Patient Name: Pamela Cox MRN: [id number] DOB:[birth year], 81 y.o., female Today's Date: 07/02/2022   PCP: [facility] REFERRING PROVIDER: Kalman Drape, DO     PT End of Session - 07/02/22 1057     Visit Number 14    Number of Visits 17    Date for PT Re-Evaluation 07/07/22    Authorization Type [employer]    PT Start Time 1006    PT Stop Time 1055    PT Time Calculation (min) 49 min    Activity Tolerance Patient tolerated treatment well;Patient limited by pain    Behavior During Therapy WFL for tasks assessed/performed                 Past Medical History:  Diagnosis Date   Arthritis    CML (chronic myelocytic leukemia) ([place])    CVA (cerebral vascular accident) ([place])    Diabetes mellitus without complication ([place])    GERD (gastroesophageal reflux disease)    Hypertension    Myelocytic leukemia, chronic ([place])    Obesity    Sleep apnea    Past Surgical History:  Procedure Laterality Date   APPENDECTOMY     COLONOSCOPY     COLONOSCOPY WITH PROPOFOL N/A 07/16/2019   Procedure: COLONOSCOPY WITH PROPOFOL;  Surgeon: Lollie Sails, MD;  Location: [facility] ENDOSCOPY;  Service: Endoscopy;  Laterality: N/A;   ESOPHAGOGASTRODUODENOSCOPY (EGD) WITH PROPOFOL N/A 07/16/2019   Procedure: ESOPHAGOGASTRODUODENOSCOPY (EGD) WITH PROPOFOL;  Surgeon: Lollie Sails, MD;  Location: [facility] ENDOSCOPY;  Service: Endoscopy;  Laterality: N/A;   TOTAL SHOULDER REPLACEMENT Right 2021   TUBAL LIGATION     Patient Active Problem List   Diagnosis Date Noted   Allergic rhinitis due to pollen 08/23/2018   DDD (degenerative disc disease), lumbar 08/23/2018   Lumbar neuritis 08/23/2018   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   GERD (gastroesophageal reflux disease) 08/23/2018   Morbid obesity ([place]) 08/23/2018   Obstructive sleep apnea 08/23/2018   Intervertebral disc disorder with radiculopathy of lumbar region 08/23/2018   CML  (chronic myelocytic leukemia) ([place]) 08/23/2018   Hx of myocardial perfusion scan 08/23/2018   Osteoarthritis 08/23/2018   Degenerative arthritis of hip 08/23/2018   Lumbar spondylosis 08/23/2018   SOB (shortness of breath) 08/23/2018   History of CVA (cerebrovascular accident) 08/23/2018   Essential hypertension 08/23/2018   Chronic right shoulder pain 08/23/2018   Complete tear of right rotator cuff 08/23/2018   Acute pain of left shoulder 08/23/2018   On antineoplastic chemotherapy 08/23/2018   Hyperlipidemia 08/23/2018   Neurogenic claudication due to lumbar spinal stenosis 08/23/2018   Vitamin D deficiency 08/23/2018    ONSET DATE: 04/13/22  REFERRING DIAG: I63.81 (ICD-10-CM) - Other cerebral infarction due to occlusion or stenosis of small artery   THERAPY DIAG:  Muscle weakness (generalized)  Unsteadiness on feet  Difficulty in walking, not elsewhere classified  Rationale for Evaluation and Treatment Rehabilitation  SUBJECTIVE:  SUBJECTIVE STATEMENT: L side of the LB is hurting. Feels like it is swollen.  Pt accompanied by: self  PERTINENT HISTORY: Chronic Myelocytic leukemia, prior MCA CVA (no residual deficits) and TIA, peripheral arterial disease, GERD, HTN, DMII, R TSA 2021  PAIN:  Are you having pain? Yes: NPRS scale: 5/10 Pain location: L LB Pain description: dull Aggravating factors: all movements Relieving factors: Gabapentin/moist heat  PRECAUTIONS: Fall  PATIENT GOALS work on strengthening and speech  OBJECTIVE:     TODAY'S TREATMENT: 07/02/22 Activity Comments  Nustep L5 x 6 min Ues/Les  Inconsistent speed, requiring cues for pacing   STM and manual TPR To L lumbar paraspinals and QL   Practice and demo using tennis ball on wall to L LB Report of good relief   sidestepping with red TB in II bars Cueing to maintian tall posture   Sitting row with red TB 2x10 Cues for proper form  romberg EO 2x30" EC 30" Occasional UE support   Standing shoulder extension red TB 15x Cues for taller posture     PATIENT EDUCATION: Education details: encouraged [facility] after anticipated D/C  Person educated: Patient Education method: Explanation Education comprehension: verbalized understanding     HOME EXERCISE PROGRAM Last updated: 05/12/22  Access Code: [email or web address] URL: [email or web address] Date: 05/12/2022 Prepared by: [facility]  Exercises - Heel Toe Raises with Counter Support  - 1 x daily - 5 x weekly - 2 sets - 10 reps - Sit to Stand with Counter Support  - 1 x daily - 5 x weekly - 2 sets - 10 reps - Side Stepping with Counter Support  - 1 x daily - 5 x weekly - 2 sets - 10 reps - Standing Gastroc Stretch at Counter  - 1 x daily - 5 x weekly - 2 sets - 30 sec hold - Seated Flexion Stretch with Swiss Ball  - 1 x daily - 5 x weekly - 2 sets - 10 reps - 5 sec hold - Seated Thoracic Flexion and Rotation with Swiss Ball  - 1 x daily - 5 x weekly - 2 sets - 10 reps - 5 sec hold - Supine Lower Trunk Rotation  - 1 x daily - 5 x weekly - 2 sets - 10 reps - Seated Piriformis Stretch  - 1 x daily - 5 x weekly - 2 sets - 30 sec hold - Seated Figure 4 Piriformis Stretch  - 1 x daily - 5 x weekly - 2 sets - 30 sec hold  Below measures were taken at time of initial evaluation unless otherwise specified:  DIAGNOSTIC FINDINGS: MR without contrast (04/13/22) Acute, small lacunar infarct in posterior limb of the right internal capsule. Sequelae of remote left parietal lobe infarct.  CT Brain without contrast (04/13/22) Unchanged background of chronic microvascular ischemic white matter changes and scattered remote small vessel infarcts.  COGNITION: Overall cognitive status: No family/caregiver present to  determine baseline cognitive functioning   SENSATION: WFL  COORDINATION: Alt pronation/supination slightly dysmetric on L; B heel to shin limited by strength/ROM   MUSCLE TONE: normal in B LEs  POSTURE: rounded shoulders, forward head, increased thoracic kyphosis, and posterior pelvic tilt  LOWER EXTREMITY ROM:     Active  Right Eval Left Eval  Hip flexion    Hip extension    Hip abduction    Hip adduction    Hip internal rotation    Hip external rotation  Knee flexion    Knee extension    Ankle dorsiflexion 10 5  Ankle plantarflexion    Ankle inversion    Ankle eversion     (Blank rows = not tested)  LOWER EXTREMITY MMT:    MMT (in sitting) Right Eval Left Eval  Hip flexion 4+ 4+  Hip extension    Hip abduction 4 4-  Hip adduction 4 4  Hip internal rotation    Hip external rotation    Knee flexion 4 4  Knee extension 4+ 4  Ankle dorsiflexion 4 4-  Ankle plantarflexion 4 4  Ankle inversion    Ankle eversion    (Blank rows = not tested)   GAIT: Gait pattern: step to pattern, step through pattern, decreased step length- Right, decreased stance time- Left, and trunk flexed Assistive device utilized: Environmental consultant - 2 wheeled Level of assistance: Modified independence   FUNCTIONAL TESTs:  5 times sit to stand: 17.36 sec with B UE support from 21 inch seat height Timed up and go (TUG): 27/77 sec with RW; imbalance and trouble staying within confines of walker during turns [employer] Scale: 30/56 indicating high risk for falls  PATIENT SURVEYS:  FOTO [phone]    PATIENT EDUCATION: Education details: prognosis, POC, HEP Person educated: Patient Education method: Explanation, Demonstration, Tactile cues, Verbal cues, and Handouts Education comprehension: verbalized understanding   HOME EXERCISE PROGRAM: Access Code: [email or web address] URL: [email or web address] Date: 04/28/2022 Prepared by: [facility]  Exercises - Heel Toe Raises with Counter Support  - 1 x daily - 5 x weekly - 2 sets - 10 reps - Sit to Stand with Counter Support  - 1 x daily - 5 x weekly - 2 sets - 10 reps - Side Stepping with Counter Support  - 1 x daily - 5 x weekly - 2 sets - 10 reps - Standing Gastroc Stretch at Counter  - 1 x daily - 5 x weekly - 2 sets - 30 sec hold    GOALS: Goals reviewed with patient? Yes  SHORT TERM GOALS: Target date: 05/19/2022  Patient to be independent with initial HEP. Baseline: HEP initiated Goal status: MET 06/09/22    LONG TERM GOALS: Target date: 07/07/2022  Patient to be independent with advanced HEP. Baseline: Not yet initiated  Goal status: IN PROGRESS  Patient to demonstrate B LE strength >/=4+/5.  Baseline: See above; 06/09/22 improved see above Goal status: IN PROGRESS  Patient to demonstrate at least 10 deg L ankle dorsiflexion AROM.Marland Kitchen  Baseline: 5 deg Goal status: MET  Patient to demonstrate alternating reciprocal pattern when ascending and descending stairs with good stability and 1 handrail as needed.   Baseline: NT; 06/09/22 able to perform with heavy B UE reliance.  Modified independence BHR step-to pattern for descending Goal status: NOT MET  Patient to complete TUG in <14 sec with LRAD in order to decrease risk of falls.   Baseline: 27.77 sec with RW; 06/09/22 22.81 sec; (06/30/22) 15.5 sec w/ RW Goal status: NOT MET  Patient to demonstrate 5xSTS test in <15 sec in order to decrease risk of falls.  Baseline: 17.36 sec with B UE support from 21 inch seat height; 06/09/22 20.11 sec from standard chair height. (06/30/22) 23.22 sec BUE assist needed Goal status: NOT MET  Patient to score at least 45/56 on Berg in order to decrease risk of falls.  Baseline: 30/56 on 05/14/22 Goal status: IN PROGRESS   ASSESSMENT:  CLINICAL  IMPRESSION: Patient arrived to session with report of continued L LBP. Upon palpation, patient with large tender trigger point  over L QL and lumbar paraspinals which respond well to MT. Educated patient on self-STM for pain management at home. Proceeded with hip strengthening, balance training, and  postural strengthening. Patient required frequent sitting rest breaks d/t LBP. Patient without complaints at end of session.      OBJECTIVE IMPAIRMENTS Abnormal gait, decreased balance, decreased coordination, decreased endurance, decreased mobility, difficulty walking, decreased ROM, decreased strength, decreased safety awareness, impaired flexibility, improper body mechanics, postural dysfunction, and pain.   ACTIVITY LIMITATIONS carrying, lifting, bending, sitting, standing, squatting, stairs, transfers, bed mobility, bathing, toileting, dressing, reach over head, hygiene/grooming, and locomotion level  PARTICIPATION LIMITATIONS: meal prep, cleaning, laundry, shopping, community activity, and church  PERSONAL FACTORS Age, Fitness, Past/current experiences, Time since onset of injury/illness/exacerbation, and 3+ comorbidities: Chronic Myelocytic leukemia, prior MCA CVA (no residual deficits) and TIA, peripheral arterial disease, GERD, HTN, DMII, R TSA 2021  are also affecting patient's functional outcome.   REHAB POTENTIAL: Good  CLINICAL DECISION MAKING: Evolving/moderate complexity  EVALUATION COMPLEXITY: Moderate  PLAN: PT FREQUENCY: 1-2x/week  PT DURATION: 4 weeks  PLANNED INTERVENTIONS: Therapeutic exercises, Therapeutic activity, Neuromuscular re-education, Balance training, Gait training, Patient/Family education, Joint mobilization, Stair training, Vestibular training, Canalith repositioning, DME instructions, Aquatic Therapy, Dry Needling, Electrical stimulation, Cryotherapy, Moist heat, Taping, Manual therapy, and Re-evaluation  PLAN FOR NEXT SESSION:  Continue to work towards reciprocal stair navigation, continue trying gait with cane for possible use with stairs? progress LE strength and balance    Janene Harvey, PT, DPT 07/02/22 10:59 AM  [facility] at [facility] [address], [place], [place] [address] Phone # [phone] Fax # [phone]

## 2022-07-02 NOTE — Therapy (Addendum)
OUTPATIENT PHYSICAL THERAPY  NOTE   Patient Name: Pamela Cox MRN: [id number] DOB:August 26, 1941, 81 y.o., female Today's Date: 07/05/2022   PCP: [facility] REFERRING PROVIDER: Kalman Drape, DO    Progress Note Reporting Period 06/14/22 to 07/05/22  See note below for Objective Data and Assessment of Progress/Goals.       PT End of Session - 07/05/22 1105     Visit Number 15    Number of Visits 17    Date for PT Re-Evaluation 07/07/22    Authorization Type [employer]    PT Start Time 1017    PT Stop Time 1102    PT Time Calculation (min) 45 min    Activity Tolerance Patient tolerated treatment well;Patient limited by pain    Behavior During Therapy WFL for tasks assessed/performed                  Past Medical History:  Diagnosis Date   Arthritis    CML (chronic myelocytic leukemia) ([place])    CVA (cerebral vascular accident) ([place])    Diabetes mellitus without complication ([place])    GERD (gastroesophageal reflux disease)    Hypertension    Myelocytic leukemia, chronic ([place])    Obesity    Sleep apnea    Past Surgical History:  Procedure Laterality Date   APPENDECTOMY     COLONOSCOPY     COLONOSCOPY WITH PROPOFOL N/A 07/16/2019   Procedure: COLONOSCOPY WITH PROPOFOL;  Surgeon: Lollie Sails, MD;  Location: [facility] ENDOSCOPY;  Service: Endoscopy;  Laterality: N/A;   ESOPHAGOGASTRODUODENOSCOPY (EGD) WITH PROPOFOL N/A 07/16/2019   Procedure: ESOPHAGOGASTRODUODENOSCOPY (EGD) WITH PROPOFOL;  Surgeon: Lollie Sails, MD;  Location: [facility] ENDOSCOPY;  Service: Endoscopy;  Laterality: N/A;   TOTAL SHOULDER REPLACEMENT Right 2021   TUBAL LIGATION     Patient Active Problem List   Diagnosis Date Noted   Allergic rhinitis due to pollen 08/23/2018   DDD (degenerative disc disease), lumbar 08/23/2018   Lumbar neuritis 08/23/2018   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   GERD (gastroesophageal reflux disease) 08/23/2018   Morbid obesity ([place])  08/23/2018   Obstructive sleep apnea 08/23/2018   Intervertebral disc disorder with radiculopathy of lumbar region 08/23/2018   CML (chronic myelocytic leukemia) ([place]) 08/23/2018   Hx of myocardial perfusion scan 08/23/2018   Osteoarthritis 08/23/2018   Degenerative arthritis of hip 08/23/2018   Lumbar spondylosis 08/23/2018   SOB (shortness of breath) 08/23/2018   History of CVA (cerebrovascular accident) 08/23/2018   Essential hypertension 08/23/2018   Chronic right shoulder pain 08/23/2018   Complete tear of right rotator cuff 08/23/2018   Acute pain of left shoulder 08/23/2018   On antineoplastic chemotherapy 08/23/2018   Hyperlipidemia 08/23/2018   Neurogenic claudication due to lumbar spinal stenosis 08/23/2018   Vitamin D deficiency 08/23/2018    ONSET DATE: 04/13/22  REFERRING DIAG: I63.81 (ICD-10-CM) - Other cerebral infarction due to occlusion or stenosis of small artery   THERAPY DIAG:  Muscle weakness (generalized)  Unsteadiness on feet  Difficulty in walking, not elsewhere classified  Rationale for Evaluation and Treatment Rehabilitation  SUBJECTIVE:  SUBJECTIVE STATEMENT: Back still hurts but it is a little better since using the tennis ball.  Pt accompanied by: self  PERTINENT HISTORY: Chronic Myelocytic leukemia, prior MCA CVA (no residual deficits) and TIA, peripheral arterial disease, GERD, HTN, DMII, R TSA 2021  PAIN:  Are you having pain? Yes: NPRS scale: 5/10 Pain location: L LB Pain description: dull Aggravating factors: all movements Relieving factors: Gabapentin/moist heat  PRECAUTIONS: Fall  PATIENT GOALS work on strengthening and speech  OBJECTIVE:     TODAY'S TREATMENT: 07/05/22 Activity Comments  Nustep L5 x 6 min Ues/LEs Maintaining ~60SPM   Sitting prayer stretch R/L with green pball 10x3" Cues to hold each position  Bridge 2x10 PT holding pillow in place; good tolerance   Deadbug with green pball on belly10x Heavy verbal and manual cues for proper coordination   sidestepping TB    staggered ant/pos wt shifts  Good coordination; heavy UE support    Standing hip ex 10x each Heavy forward trunk lean on counter top  Standing hip abd 10x each Heavy forward trunk lean on counter top  heel/toe raise 10x Heavy forward trunk lean on counter top; difficulty raising R toes   Sitting rest breaks with moist hot pack on LB during standing exercises    Sitting DF strength 4/5 on R , 4+/5 on L     PATIENT EDUCATION: Education details: re-administered HEP handout for LB stretching Person educated: Patient Education method: Explanation, Demonstration, Tactile cues, Verbal cues, and Handouts Education comprehension: verbalized understanding and returned demonstration     HOME EXERCISE PROGRAM Last updated: 05/12/22  Access Code: [email or web address] URL: [email or web address] Date: 05/12/2022 Prepared by: [facility]  Exercises - Heel Toe Raises with Counter Support  - 1 x daily - 5 x weekly - 2 sets - 10 reps - Sit to Stand with Counter Support  - 1 x daily - 5 x weekly - 2 sets - 10 reps - Side Stepping with Counter Support  - 1 x daily - 5 x weekly - 2 sets - 10 reps - Standing Gastroc Stretch at Counter  - 1 x daily - 5 x weekly - 2 sets - 30 sec hold - Seated Flexion Stretch with Swiss Ball  - 1 x daily - 5 x weekly - 2 sets - 10 reps - 5 sec hold - Seated Thoracic Flexion and Rotation with Swiss Ball  - 1 x daily - 5 x weekly - 2 sets - 10 reps - 5 sec hold - Supine Lower Trunk Rotation  - 1 x daily - 5 x weekly - 2 sets - 10 reps - Seated Piriformis Stretch  - 1 x daily - 5 x weekly - 2 sets - 30 sec hold - Seated Figure 4 Piriformis Stretch  - 1 x daily - 5 x weekly - 2 sets - 30 sec  hold    Below measures were taken at time of initial evaluation unless otherwise specified:  DIAGNOSTIC FINDINGS: MR without contrast (04/13/22) Acute, small lacunar infarct in posterior limb of the right internal capsule. Sequelae of remote left parietal lobe infarct.  CT Brain without contrast (04/13/22) Unchanged background of chronic microvascular ischemic white matter changes and scattered remote small vessel infarcts.  COGNITION: Overall cognitive status: No family/caregiver present to determine baseline cognitive functioning   SENSATION: WFL  COORDINATION: Alt pronation/supination slightly dysmetric on L; B heel to shin limited by strength/ROM   MUSCLE TONE: normal in B LEs  POSTURE: rounded shoulders, forward head, increased thoracic kyphosis, and posterior pelvic tilt  LOWER EXTREMITY ROM:     Active  Right Eval Left Eval  Hip flexion    Hip extension    Hip abduction    Hip adduction    Hip internal rotation    Hip external rotation    Knee flexion    Knee extension    Ankle dorsiflexion 10 5  Ankle plantarflexion    Ankle inversion    Ankle eversion     (Blank rows = not tested)  LOWER EXTREMITY MMT:    MMT (in sitting) Right Eval Left Eval  Hip flexion 4+ 4+  Hip extension    Hip abduction 4 4-  Hip adduction 4 4  Hip internal rotation    Hip external rotation    Knee flexion 4 4  Knee extension 4+ 4  Ankle dorsiflexion 4 4-  Ankle plantarflexion 4 4  Ankle inversion    Ankle eversion    (Blank rows = not tested)   GAIT: Gait pattern: step to pattern, step through pattern, decreased step length- Right, decreased stance time- Left, and trunk flexed Assistive device utilized: Environmental consultant - 2 wheeled Level of assistance: Modified independence   FUNCTIONAL TESTs:  5 times sit to stand: 17.36 sec with B UE support from 21 inch seat height Timed up and go (TUG): 27/77 sec with RW; imbalance and trouble staying within confines of walker during  turns [employer] Scale: 30/56 indicating high risk for falls  PATIENT SURVEYS:  FOTO [phone]    PATIENT EDUCATION: Education details: prognosis, POC, HEP Person educated: Patient Education method: Explanation, Demonstration, Tactile cues, Verbal cues, and Handouts Education comprehension: verbalized understanding   HOME EXERCISE PROGRAM: Access Code: [email or web address] URL: [email or web address] Date: 04/28/2022 Prepared by: [facility]  Exercises - Heel Toe Raises with Counter Support  - 1 x daily - 5 x weekly - 2 sets - 10 reps - Sit to Stand with Counter Support  - 1 x daily - 5 x weekly - 2 sets - 10 reps - Side Stepping with Counter Support  - 1 x daily - 5 x weekly - 2 sets - 10 reps - Standing Gastroc Stretch at Counter  - 1 x daily - 5 x weekly - 2 sets - 30 sec hold    GOALS: Goals reviewed with patient? Yes  SHORT TERM GOALS: Target date: 05/19/2022  Patient to be independent with initial HEP. Baseline: HEP initiated Goal status: MET 06/09/22    LONG TERM GOALS: Target date: 07/07/2022  Patient to be independent with advanced HEP. Baseline: Not yet initiated  Goal status: IN PROGRESS  Patient to demonstrate B LE strength >/=4+/5.  Baseline: See above; 06/09/22 improved see above Goal status: IN PROGRESS  Patient to demonstrate at least 10 deg L ankle dorsiflexion AROM.Marland Kitchen  Baseline: 5 deg Goal status: MET  Patient to demonstrate alternating reciprocal pattern when ascending and descending stairs with good stability and 1 handrail as needed.   Baseline: NT; 06/09/22 able to perform with heavy B UE reliance.  Modified independence BHR step-to pattern for descending Goal status: NOT MET  Patient to complete TUG in <14 sec with LRAD in order to decrease risk of falls.   Baseline: 27.77 sec with RW; 06/09/22 22.81 sec; (06/30/22) 15.5 sec w/ RW Goal status: NOT MET  Patient to demonstrate 5xSTS test in <15 sec in  order to decrease risk of falls.  Baseline: 17.36 sec with B UE support from 21 inch seat height; 06/09/22 20.11 sec from standard chair height. (06/30/22) 23.22 sec BUE assist needed Goal status: NOT MET  Patient to score at least 45/56 on Berg in order to decrease risk of falls.  Baseline: 30/56 on 05/14/22 Goal status: IN PROGRESS   ASSESSMENT:  CLINICAL IMPRESSION: Patient arrived to session with report of some improvement in L LBP since trying self-STM at home. Worked on core strengthening activities with patient reporting good tolerance, but difficulty coordinating alternating reciprocal motions. Patient demonstrated good carryover of log roll to avoid excessive strain on LB with bed mobility. Standing activities today were limited by c/o LBP. Sitting rest breaks with hot pack on LB were taken after short durations of standing exercises d/t pain. Patient with difficulty performing DF on R ankle in standing; upon reassessment, was slightly weaker than L LE but unchanged from baseline. Patient without complaints at end of session.      OBJECTIVE IMPAIRMENTS Abnormal gait, decreased balance, decreased coordination, decreased endurance, decreased mobility, difficulty walking, decreased ROM, decreased strength, decreased safety awareness, impaired flexibility, improper body mechanics, postural dysfunction, and pain.   ACTIVITY LIMITATIONS carrying, lifting, bending, sitting, standing, squatting, stairs, transfers, bed mobility, bathing, toileting, dressing, reach over head, hygiene/grooming, and locomotion level  PARTICIPATION LIMITATIONS: meal prep, cleaning, laundry, shopping, community activity, and church  PERSONAL FACTORS Age, Fitness, Past/current experiences, Time since onset of injury/illness/exacerbation, and 3+ comorbidities: Chronic Myelocytic leukemia, prior MCA CVA (no residual deficits) and TIA, peripheral arterial disease, GERD, HTN, DMII, R TSA 2021  are also affecting patient's  functional outcome.   REHAB POTENTIAL: Good  CLINICAL DECISION MAKING: Evolving/moderate complexity  EVALUATION COMPLEXITY: Moderate  PLAN: PT FREQUENCY: 1-2x/week  PT DURATION: 4 weeks  PLANNED INTERVENTIONS: Therapeutic exercises, Therapeutic activity, Neuromuscular re-education, Balance training, Gait training, Patient/Family education, Joint mobilization, Stair training, Vestibular training, Canalith repositioning, DME instructions, Aquatic Therapy, Dry Needling, Electrical stimulation, Cryotherapy, Moist heat, Taping, Manual therapy, and Re-evaluation  PLAN FOR NEXT SESSION:  progress LE strength and balance   Janene Harvey, PT, DPT 07/05/22 11:06 AM  [facility] at [facility] [address] [place], [place] [address] Phone # [phone] Fax # [phone]    PHYSICAL THERAPY DISCHARGE SUMMARY  Visits from Start of Care: 15  Current functional level related to goals / functional outcomes: Unable to assess; patient did not return   Remaining deficits: Unable to assess   Education / Equipment: HEP  Plan: Patient agrees to discharge.  Patient goals were not met. Patient is being discharged due to not returning to PT.    Janene Harvey, PT, DPT 01/31/23 2:42 PM  [facility] at [facility] [address] [place], [place], [place] [address] Phone # [phone] Fax # [phone]

## 2022-07-05 ENCOUNTER — Ambulatory Visit: Payer: Medicare HMO | Admitting: Physical Therapy

## 2022-07-05 ENCOUNTER — Encounter: Payer: Medicare HMO | Admitting: Occupational Therapy

## 2022-07-05 ENCOUNTER — Ambulatory Visit: Payer: Medicare HMO

## 2022-07-05 ENCOUNTER — Encounter: Payer: Self-pay | Admitting: Physical Therapy

## 2022-07-05 DIAGNOSIS — R2681 Unsteadiness on feet: Secondary | ICD-10-CM

## 2022-07-05 DIAGNOSIS — M6281 Muscle weakness (generalized): Secondary | ICD-10-CM | POA: Diagnosis not present

## 2022-07-05 DIAGNOSIS — R471 Dysarthria and anarthria: Secondary | ICD-10-CM

## 2022-07-05 DIAGNOSIS — R41841 Cognitive communication deficit: Secondary | ICD-10-CM

## 2022-07-05 DIAGNOSIS — R262 Difficulty in walking, not elsewhere classified: Secondary | ICD-10-CM

## 2022-07-05 NOTE — Therapy (Signed)
OUTPATIENT SPEECH LANGUAGE PATHOLOGY TREATMENT NOTE   Patient Name: Pamela Cox MRN: [id number] DOB:1941/10/21, 81 y.o., female Today's Date: 07/05/2022  PCP: Loann Quill, MD REFERRING PROVIDER: Jerold Coombe, DO  END OF SESSION:   End of Session - 07/05/22 1124     Visit Number 13    Number of Visits 23    Date for SLP Re-Evaluation 07/22/22    SLP Start Time 1103    SLP Stop Time  1140    SLP Time Calculation (min) 37 min    Activity Tolerance Patient tolerated treatment well                 Past Medical History:  Diagnosis Date   Arthritis    CML (chronic myelocytic leukemia) ([place])    CVA (cerebral vascular accident) ([place])    Diabetes mellitus without complication ([place])    GERD (gastroesophageal reflux disease)    Hypertension    Myelocytic leukemia, chronic ([place])    Obesity    Sleep apnea    Past Surgical History:  Procedure Laterality Date   APPENDECTOMY     COLONOSCOPY     COLONOSCOPY WITH PROPOFOL N/A 07/16/2019   Procedure: COLONOSCOPY WITH PROPOFOL;  Surgeon: Lollie Sails, MD;  Location: [facility] ENDOSCOPY;  Service: Endoscopy;  Laterality: N/A;   ESOPHAGOGASTRODUODENOSCOPY (EGD) WITH PROPOFOL N/A 07/16/2019   Procedure: ESOPHAGOGASTRODUODENOSCOPY (EGD) WITH PROPOFOL;  Surgeon: Lollie Sails, MD;  Location: [facility] ENDOSCOPY;  Service: Endoscopy;  Laterality: N/A;   TOTAL SHOULDER REPLACEMENT Right 2021   TUBAL LIGATION     Patient Active Problem List   Diagnosis Date Noted   Allergic rhinitis due to pollen 08/23/2018   DDD (degenerative disc disease), lumbar 08/23/2018   Lumbar neuritis 08/23/2018   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   GERD (gastroesophageal reflux disease) 08/23/2018   Morbid obesity ([place]) 08/23/2018   Obstructive sleep apnea 08/23/2018   Intervertebral disc disorder with radiculopathy of lumbar region 08/23/2018   CML (chronic myelocytic leukemia) ([place]) 08/23/2018   Hx of myocardial perfusion scan  08/23/2018   Osteoarthritis 08/23/2018   Degenerative arthritis of hip 08/23/2018   Lumbar spondylosis 08/23/2018   SOB (shortness of breath) 08/23/2018   History of CVA (cerebrovascular accident) 08/23/2018   Essential hypertension 08/23/2018   Chronic right shoulder pain 08/23/2018   Complete tear of right rotator cuff 08/23/2018   Acute pain of left shoulder 08/23/2018   On antineoplastic chemotherapy 08/23/2018   Hyperlipidemia 08/23/2018   Neurogenic claudication due to lumbar spinal stenosis 08/23/2018   Vitamin D deficiency 08/23/2018   SPEECH THERAPY DISCHARGE SUMMARY  Visits from Start of Care: 13  Current functional level related to goals / functional outcomes: See below. Pt improved speech clarity, self corrects when speech rate is too fast, and reports needing no/little assistance with memory.   Remaining deficits: Mild dysarthria, intermittent memory deficits.   Education / Equipment: Speech intelligibilty compensations, HEPs for dysarthria and oral motor strength, memory strategies.   Patient agrees to discharge. Patient goals were met. Patient is being discharged due to meeting the stated rehab goals. And being pleased with current functional level.      ONSET DATE: 04/13/22  REFERRING DIAG: Lacunar infarct, acute  THERAPY DIAG:  Dysarthria and anarthria  Cognitive communication deficit  Rationale for Evaluation and Treatment Rehabilitation  SUBJECTIVE: "Still doing great Kelaiah Escalona."   PAIN:  Are you having pain? Yes   Pain scale: 5/10 Location: lower back    OBJECTIVE:  TODAY'S TREATMENT:  07/05/22: Taci reports family does not ask her to repeat, and she cont to perform HEPs regularly. She req'd SBA with oral motor strength HEP and independent with overarticulation HEP. Marticia spontaneously used compensations for speech clarity in a long conversation frequently today. When she sensed generating fast speech rate she reduced her rate 80% of the time.  Communication Effectiveness Survey was taken again today and scored 26/32, indicating notably improved (from 8/32) communication effectiveness. She agrees with d/c today.   06/30/22: Dilan spontaneously used compensations for speech clarity in conversation frequently today. When she thought she was going too fast she reduced her rate 85% of the time. She req'd SBA with oral motor strength HEP and was WNL for overarticulation HEP - SLP and pt agreed 07/05/22 as pt's last day.  06/16/22: Family and friends cont able to understand pt and are not asking pt to repeat. SLP engaged pt in 3 separate conversation today, for 16, 15 and 10 minutes, about simple to mod complex topics. SLP req'd to provide occasionally min cues for rate reduction. She completed HEPs today without assistance. Pt agrees with SLP she can reduce to once/week for 2-3 more visits then d/c if things remain the same.  06/14/22: Family and friends are not asking pt to repeat. SLP engaged pt in 2 separate conversation today, for 17 and 16 minutes, about topics related to family. SLP req'd to occasionally cue pt min A. Adena reports that she is assisting her memory for appointments with her calendar, is taking her meds with dtr verifying she took meds appropriately but reports independence with this. She is satisfied with memory skills, as they are functional at this time and she is using some compensations successfully.   06/09/22: SLP engaged in conversation for 15 minutes; SLP had to cue pt x3 to slow rate, and one nonverbal cue to slow her rate. Pt successful at this after SLP cue. Next conversation was 17 minutes and pt self-regulated her speech rate successfully - SLP only needed to provide one nonverbal cue.  SLP and pt discussed pt's memory for PT and OT exercises each day - SLP suggested she write (or have someone write for her) in her composition book each afternoon when she can perform her exercises the following day. The next morning she can  look in her composition book to remind herself when she will perform her exercises.   06/08/22: Pt told SLP about last ST session ("S" statement). "I got  my composition book, so I don't have to keep up with every little note and where things are. I just write it down in the composition book." SLP reiterated pt needs to perform HEP consistently (6 days/week) in order to regain strength in the oral musculature (if droop/dysarthria due to decr'd muscle strength). Pt was independent with overarticulation HEP but req'd min A occasionally for strength HEP. Pt was modified independent/independent by session end. SLP strongly reiterated to pt she must speak slower to improve speech intelligibility, and provided homework for pt to practice this.    PROM - Today pt took follow up communication effectiveness survey, pt self-scored 26/32, improved from 8/32.        PATIENT EDUCATION: Education details: see today's treatment, above Person educated: Patient  Education method: Explanation Education comprehension: verbalized understanding, returned demonstration, and needs further education         GOALS: Goals reviewed with patient? Yes   SHORT TERM GOALS: Target date: 05/28/2022     Pt will perform HEP  for dysarthria compensation (overarticulation) with rare min A in 3 sessions Baseline:  Goal status: Not met   2.  Pt will perform HEP for dysarthria (oral motor strength) with rare min A in 3 sessions Baseline:  Goal status: Not met   3.  Pt will engage in 3 minutes simple conversation with 90%+ intelligibility in 2 sessions Baseline:  Goal status: Not met   4.  Pt will complete speech PROM in first two therapy sessions Baseline:  Goal status: Met   5.  Pt will develop memory compensation/s to assist with home tasks and IADLs (e.g., speech HEP, to-do or grocery list, med administration, etc) Baseline:  Goal status: Met   LONG TERM GOALS: Target date: 07/22/2022     Pt will perform HEP  for dysarthria compensation (overarticulation) with modified independence or SBA in 3 sessions Baseline:  06-08-22, 06-14-22 Goal status: Met   2.  Pt will perform HEP for dysarthria (oral motor strength) with modified independence or SBA in 3 sessions Baseline:  06/14/22, 06/16/22 Goal status: Met   3.  Pt will engage in 15 minutes simple-mod complex conversation with 95%+ intelligibility with compensations in 3 sessions Baseline: 06-09-22, 06/16/22 Goal status: Met   4.  Pt will improve score on speech PROM taken in her last two therapy sessions Baseline:  Goal status: Met  5.   Pt will report use of/use memory compensation/s to assist with home tasks and IADLs in 5 sessions Baseline: 06/08/22 Goal status: Deferred, satisfied with progress   ASSESSMENT:   CLINICAL IMPRESSION: Patient is a 81 y.o. female with diagnosis of dysarthria. See tx note. Pt again spontaneously used compensations for speech clarity in 20 minutes conversation with good success, and Nafisa spontaneously slowed her speech when she knew she was articulating too quickly 80% of the time. Rare SBA was necessary with oral motor strength HEP and she was independent for overarticulation HEP. Or reports family and friends are not asking pt to repeat, and pt is still satisfied with memory skills at this time. Pt agrees with d/c today.    OBJECTIVE IMPAIRMENTS include memory, and dysarthria. These impairments are limiting patient from household responsibilities. Factors affecting potential to achieve goals and functional outcome are previous level of function.. Patient will benefit from skilled SLP services to address above impairments and improve overall function.   REHAB POTENTIAL: Good   PLAN: SLP FREQUENCY: 2x/week   SLP DURATION: 12 weeks   PLANNED INTERVENTIONS: Environmental controls, Cueing hierachy, Cognitive reorganization, Internal/external aids, Oral motor exercises, Functional tasks, Multimodal communication  approach, SLP instruction and feedback, Compensatory strategies, and Patient/family education       [facility], [place] 07/05/2022, 11:24 AM

## 2022-07-07 ENCOUNTER — Ambulatory Visit: Admission: RE | Admit: 2022-07-07 | Payer: Medicare HMO | Source: Ambulatory Visit | Admitting: Internal Medicine

## 2022-07-07 ENCOUNTER — Encounter: Payer: Medicare HMO | Admitting: Occupational Therapy

## 2022-07-07 ENCOUNTER — Encounter: Admission: RE | Payer: Self-pay | Source: Ambulatory Visit

## 2022-07-07 ENCOUNTER — Ambulatory Visit: Payer: Medicare HMO | Admitting: Physical Therapy

## 2022-07-07 SURGERY — COLONOSCOPY WITH PROPOFOL
Anesthesia: General

## 2022-07-15 ENCOUNTER — Ambulatory Visit: Payer: Medicare HMO | Attending: Otolaryngology

## 2022-07-19 ENCOUNTER — Other Ambulatory Visit: Payer: Self-pay | Admitting: Otolaryngology

## 2022-07-19 DIAGNOSIS — E041 Nontoxic single thyroid nodule: Secondary | ICD-10-CM

## 2022-08-03 ENCOUNTER — Ambulatory Visit
Admission: RE | Admit: 2022-08-03 | Discharge: 2022-08-03 | Disposition: A | Discharge status: Home / Self Care | Payer: Medicare HMO | Source: Ambulatory Visit | Attending: Otolaryngology | Admitting: Otolaryngology

## 2022-08-03 ENCOUNTER — Other Ambulatory Visit: Payer: Medicare HMO

## 2022-08-03 DIAGNOSIS — E041 Nontoxic single thyroid nodule: Secondary | ICD-10-CM

## 2023-08-24 ENCOUNTER — Other Ambulatory Visit: Payer: Self-pay | Admitting: Family Medicine

## 2023-08-24 DIAGNOSIS — Z1231 Encounter for screening mammogram for malignant neoplasm of breast: Secondary | ICD-10-CM

## 2023-09-01 ENCOUNTER — Ambulatory Visit
Admission: RE | Admit: 2023-09-01 | Discharge: 2023-09-01 | Disposition: A | Discharge status: Home / Self Care | Payer: Medicare HMO | Source: Ambulatory Visit | Attending: Family Medicine | Admitting: Family Medicine

## 2023-09-01 DIAGNOSIS — Z1231 Encounter for screening mammogram for malignant neoplasm of breast: Secondary | ICD-10-CM | POA: Diagnosis present

## 2024-01-09 NOTE — Therapy (Signed)
OUTPATIENT PHYSICAL THERAPY THORACOLUMBAR EVALUATION   Patient Name: Pamela Cox MRN: [id number] DOB:28-Jan-1941, 83 y.o., female Today's Date: 01/10/2024  END OF SESSION:  PT End of Session - 01/10/24 1146     Visit Number 1    Date for PT Re-Evaluation 03/06/24    Authorization Type [employer] MCR    Progress Note Due on Visit 10    PT Start Time 1146    PT Stop Time 1230    PT Time Calculation (min) 44 min    Activity Tolerance Patient tolerated treatment well    Behavior During Therapy WFL for tasks assessed/performed             Past Medical History:  Diagnosis Date   Arthritis    CML (chronic myelocytic leukemia) (HCC)    CVA (cerebral vascular accident) (HCC)    Diabetes mellitus without complication (HCC)    GERD (gastroesophageal reflux disease)    Hypertension    Myelocytic leukemia, chronic (HCC)    Obesity    Sleep apnea    Past Surgical History:  Procedure Laterality Date   APPENDECTOMY     COLONOSCOPY     COLONOSCOPY WITH PROPOFOL N/A 07/16/2019   Procedure: COLONOSCOPY WITH PROPOFOL;  Surgeon: Christena Deem, MD;  Location: [facility] ENDOSCOPY;  Service: Endoscopy;  Laterality: N/A;   ESOPHAGOGASTRODUODENOSCOPY (EGD) WITH PROPOFOL N/A 07/16/2019   Procedure: ESOPHAGOGASTRODUODENOSCOPY (EGD) WITH PROPOFOL;  Surgeon: Christena Deem, MD;  Location: [facility] [place] ENDOSCOPY;  Service: Endoscopy;  Laterality: N/A;   TOTAL SHOULDER REPLACEMENT Right 2021   TUBAL LIGATION     Patient Active Problem List   Diagnosis Date Noted   Allergic rhinitis due to pollen 08/23/2018   DDD (degenerative disc disease), lumbar 08/23/2018   Lumbar neuritis 08/23/2018   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   GERD (gastroesophageal reflux disease) 08/23/2018   Morbid obesity (HCC) 08/23/2018   Obstructive sleep apnea 08/23/2018   Intervertebral disc disorder with radiculopathy of lumbar region 08/23/2018   CML (chronic myelocytic leukemia) (HCC) 08/23/2018   Hx of myocardial perfusion  scan 08/23/2018   Osteoarthritis 08/23/2018   Degenerative arthritis of hip 08/23/2018   Lumbar spondylosis 08/23/2018   SOB (shortness of breath) 08/23/2018   History of CVA (cerebrovascular accident) 08/23/2018   Essential hypertension 08/23/2018   Chronic right shoulder pain 08/23/2018   Complete tear of right rotator cuff 08/23/2018   Acute pain of left shoulder 08/23/2018   On antineoplastic chemotherapy 08/23/2018   Hyperlipidemia 08/23/2018   Neurogenic claudication due to lumbar spinal stenosis 08/23/2018   Vitamin D deficiency 08/23/2018    PCP: [facility]   REFERRING PROVIDER: Tawana Scale, MD   REFERRING DIAG: [phone] (ICD-10-CM) - Spondylosis of lumbar region without myelopathy or radiculopathy  Low back pain, multifidus muscle training   Rationale for Evaluation and Treatment: Rehabilitation  THERAPY DIAG:  Muscle weakness (generalized)  Difficulty in walking, not elsewhere classified  Other low back pain  Cramp and spasm  ONSET DATE: years  SUBJECTIVE:  SUBJECTIVE STATEMENT:  Long history of low back pain. Sees [facility] pain management. In the morning I can hardly get up, but after that I do pretty good. I sit in a lift chair. When I'm working on things I sit in my w/c because I'm afraid of falling.  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 Yes: NPRS scale: 71/10 up to 10/10 Pain location: low back Left side worse Pain description: sore and weak Aggravating factors: overhead reaching (putting dishes away) Relieving factors: no  PRECAUTIONS: None  RED FLAGS: None   WEIGHT BEARING RESTRICTIONS: No  FALLS:  Has patient fallen in last 6 months? No  LIVING  ENVIRONMENT: Lives with: lives with their family and lives with their daughter Lives in: House/apartment Stairs: 14 steps with rail on left Has following equipment at home: Dan Humphreys - 2 wheeled, Environmental consultant - 4 wheeled, and Wheelchair (manual)  OCCUPATION: retired  PLOF: Independent  PATIENT GOALS: get moving better, decrease soreness, hard to get undergarments on, getting around in bed  NEXT MD VISIT: after PT  OBJECTIVE:  Note: Objective measures were completed at Evaluation unless otherwise noted.  DIAGNOSTIC FINDINGS:  XR 2023: Findings/Impression:   Segmentation: There are five non-rib-bearing lumbar-type vertebral bodies.  Sclerotic degenerative changes along the L5 inferior and S1 superior  endplate.  Bones: Generalized osteopenia. No displaced fracture or loss of vertebral  body height.  Limited evaluation of the sacrum due to overlying bowel gas.  Alignment: Slight lumbar dextrocurvature. Unchanged slight grade 1  anterolisthesis of L3 on L4 and L4 on L5.  Degenerative changes: Interval progression of severe multilevel lumbar  spondylosis from 2021. Mild bilateral hip and bilateral SI joint  degenerative changes. Pubic symphysis appears maintained.   PATIENT SURVEYS:  Modified Oswestry  27 / 50 = 54.0 %    COGNITION: Overall cognitive status: Within functional limits for tasks assessed     SENSATION: WFL  MUSCLE LENGTH: B HS WNL, R piriformis mild tightness  POSTURE: flexed trunk   PALPATION: Palpation: TTP at gluteals  LUMBAR ROM:   AROM eval  Flexion full  Extension Painful limited 80%  Right lateral flexion Limited and LOB   Left lateral flexion   Right rotation Limited 60%  Left rotation Limited 50%   (Blank rows = not tested)  LOWER EXTREMITY ROM:   WFL for tasks assessed    Right eval Left eval  Hip flexion    Hip extension    Hip abduction    Hip adduction    Hip internal rotation    Hip external rotation    Knee flexion    Knee  extension    Ankle dorsiflexion    Ankle plantarflexion    Ankle inversion    Ankle eversion     (Blank rows = not tested)  LOWER EXTREMITY MMT:    MMT Right eval Left eval  Hip flexion 4- 4  Hip extension    Hip abduction    Hip adduction    Hip internal rotation    Hip external rotation    Knee flexion 5 5  Knee extension 5 5  Ankle dorsiflexion 5 5  Ankle plantarflexion    Ankle inversion    Ankle eversion     (Blank rows = not tested)   FUNCTIONAL TESTS:  5 times sit to stand: 23.47 with B UE assist 3 minute walk test: 2.5 min 268 ft  TREATMENT DATE:                                                                                                                               01/10/24 See pt ed and HEP    PATIENT EDUCATION:  Education details: PT eval findings, anticipated POC, and initial HEP  Person educated: Patient Education method: Explanation, Demonstration, and Handouts Education comprehension: verbalized understanding and returned demonstration  HOME EXERCISE PROGRAM: Access Code: [email or web address] URL: [email or web address] Date: 01/10/2024 Prepared by: Raynelle Fanning  Exercises - Supine Bridge  - 2 x daily - 7 x weekly - 1-3 sets - 10 reps - Sit to Stand with Counter Support  - 2 x daily - 3 x weekly - 1 sets - 5 reps  ASSESSMENT:  CLINICAL IMPRESSION: Patient is a 83 y.o. female who was seen today for physical therapy evaluation and treatment for low back pain of a chronic nature. She reports that her back pain limits her primarily from standing to do ADLs, walking, and moving in bed. She uses the stairs at home, but she puts so much pressure through her UE it causes significant pain. She scored 54% disability on the modified oswestry and she was unable to complete the 3 MWT with her RW due to fatigue rather than back pain. She will benefit from skilled PT to address these deficits.    OBJECTIVE IMPAIRMENTS: decreased activity tolerance, decreased  balance, difficulty walking, decreased ROM, decreased strength, increased muscle spasms, impaired flexibility, postural dysfunction, obesity, and pain.   ACTIVITY LIMITATIONS: carrying, lifting, standing, stairs, bed mobility, dressing, reach over head, hygiene/grooming, and locomotion level  PARTICIPATION LIMITATIONS: meal prep, cleaning, laundry, community activity, and church  PERSONAL FACTORS: Age, Fitness, Past/current experiences, Time since onset of injury/illness/exacerbation, and 3+ comorbidities: CVA x 3, L shoulder OA, obesity  are also affecting patient's functional outcome.   REHAB POTENTIAL: Fair due to multiple comorbidities and frequency of visits  CLINICAL DECISION MAKING: Evolving/moderate complexity  EVALUATION COMPLEXITY: Moderate   GOALS: Goals reviewed with patient? Yes  SHORT TERM GOALS: Target date: 02/07/2024   Patient will be independent with initial HEP.  Baseline:  Goal status: INITIAL  2.  Patient will demonstrate improved LE strength by improving 5XSTS by 2-3 seconds.  Baseline: 23.47 Goal status: INITIAL  3.  Able to complete with RW Baseline: 2.5 min 268 ft Goal status: INITIAL   LONG TERM GOALS: Target date: 03/06/2024   Patient will be independent with advanced/ongoing HEP to improve outcomes and carryover.  Baseline:  Goal status: INITIAL  2.  Patient will report 50% improvement in low back pain with ADLs to improve QOL.  Baseline:  Goal status: INITIAL  3.  Patient will demonstrate improved LE strength by being able climb stairs with 50% less shoulder pain.   Baseline: relies heavily on L UE Goal status: INITIAL  4.  Patient will report improved ease of donning her undergarments by 50% Baseline: must  do in supine bridge position Goal status: INITIAL  5.  Patient will score < = 21 on the Modified Oswestry demonstrating improved functional ability.  Baseline: 27 / 50 Goal status: INITIAL  6.  Patient will demonstrate  increased endurance with walking by completing a 5 MWT. Baseline: 2.5 min Goal status: INITIAL    PLAN:  PT FREQUENCY: 2x/week  PT DURATION: 8 weeks  PLANNED INTERVENTIONS: 97164- PT Re-evaluation, 97110-Therapeutic exercises, 97530- Therapeutic activity, [id number]- Neuromuscular re-education, 97535- Self Care, 59563- Manual therapy, [id number]- Gait training, [id number]- Electrical stimulation (unattended), [id number]- Traction (mechanical), Patient/Family education, Stair training, Taping, Dry Needling, Joint mobilization, Spinal mobilization, DME instructions, Cryotherapy, and Moist heat.  PLAN FOR NEXT SESSION: work on functional LE strength (STS, step ups), trial of DN to lumbar/gluteals, walking endurance, progress HEP; see functional goals.   Solon Palm, PT  01/10/2024, 2:02 PM

## 2024-01-10 ENCOUNTER — Encounter: Payer: Self-pay | Admitting: Physical Therapy

## 2024-01-10 ENCOUNTER — Ambulatory Visit: Payer: Medicare HMO | Attending: Anesthesiology | Admitting: Physical Therapy

## 2024-01-10 DIAGNOSIS — M5459 Other low back pain: Secondary | ICD-10-CM | POA: Diagnosis present

## 2024-01-10 DIAGNOSIS — M6281 Muscle weakness (generalized): Secondary | ICD-10-CM | POA: Diagnosis present

## 2024-01-10 DIAGNOSIS — R262 Difficulty in walking, not elsewhere classified: Secondary | ICD-10-CM | POA: Insufficient documentation

## 2024-01-10 DIAGNOSIS — R252 Cramp and spasm: Secondary | ICD-10-CM | POA: Insufficient documentation

## 2024-01-17 ENCOUNTER — Ambulatory Visit: Payer: Medicare HMO

## 2024-01-17 DIAGNOSIS — M5459 Other low back pain: Secondary | ICD-10-CM

## 2024-01-17 DIAGNOSIS — M6281 Muscle weakness (generalized): Secondary | ICD-10-CM | POA: Diagnosis not present

## 2024-01-17 DIAGNOSIS — R262 Difficulty in walking, not elsewhere classified: Secondary | ICD-10-CM

## 2024-01-17 DIAGNOSIS — R252 Cramp and spasm: Secondary | ICD-10-CM

## 2024-01-17 NOTE — Therapy (Signed)
 OUTPATIENT PHYSICAL THERAPY THORACOLUMBAR EVALUATION   Patient Name: Pamela Cox MRN: [id number] DOB:1941/02/26, 83 y.o., female Today's Date: 01/17/2024  END OF SESSION:  PT End of Session - 01/17/24 1312     Visit Number 2    Date for PT Re-Evaluation 03/06/24    Authorization Type [employer] MCR    Progress Note Due on Visit 10    PT Start Time 1233    PT Stop Time 1313    PT Time Calculation (min) 40 min    Activity Tolerance Patient tolerated treatment well    Behavior During Therapy WFL for tasks assessed/performed              Past Medical History:  Diagnosis Date   Arthritis    CML (chronic myelocytic leukemia) (HCC)    CVA (cerebral vascular accident) (HCC)    Diabetes mellitus without complication (HCC)    GERD (gastroesophageal reflux disease)    Hypertension    Myelocytic leukemia, chronic (HCC)    Obesity    Sleep apnea    Past Surgical History:  Procedure Laterality Date   APPENDECTOMY     COLONOSCOPY     COLONOSCOPY WITH PROPOFOL N/A 07/16/2019   Procedure: COLONOSCOPY WITH PROPOFOL;  Surgeon: Christena Deem, MD;  Location: [facility] ENDOSCOPY;  Service: Endoscopy;  Laterality: N/A;   ESOPHAGOGASTRODUODENOSCOPY (EGD) WITH PROPOFOL N/A 07/16/2019   Procedure: ESOPHAGOGASTRODUODENOSCOPY (EGD) WITH PROPOFOL;  Surgeon: Christena Deem, MD;  Location: [facility] ENDOSCOPY;  Service: Endoscopy;  Laterality: N/A;   TOTAL SHOULDER REPLACEMENT Right 2021   TUBAL LIGATION     Patient Active Problem List   Diagnosis Date Noted   Allergic rhinitis due to pollen 08/23/2018   DDD (degenerative disc disease), lumbar 08/23/2018   Lumbar neuritis 08/23/2018   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   GERD (gastroesophageal reflux disease) 08/23/2018   Morbid obesity (HCC) 08/23/2018   Obstructive sleep apnea 08/23/2018   Intervertebral disc disorder with radiculopathy of lumbar region 08/23/2018   CML (chronic myelocytic leukemia) (HCC) 08/23/2018   Hx of myocardial  perfusion scan 08/23/2018   Osteoarthritis 08/23/2018   Degenerative arthritis of hip 08/23/2018   Lumbar spondylosis 08/23/2018   SOB (shortness of breath) 08/23/2018   History of CVA (cerebrovascular accident) 08/23/2018   Essential hypertension 08/23/2018   Chronic right shoulder pain 08/23/2018   Complete tear of right rotator cuff 08/23/2018   Acute pain of left shoulder 08/23/2018   On antineoplastic chemotherapy 08/23/2018   Hyperlipidemia 08/23/2018   Neurogenic claudication due to lumbar spinal stenosis 08/23/2018   Vitamin D deficiency 08/23/2018    [facility]: [facility]   REFERRING PROVIDER: Tawana Scale, MD   REFERRING DIAG: [phone] (ICD-10-CM) - Spondylosis of lumbar region without myelopathy or radiculopathy  Low back pain, multifidus muscle training   Rationale for Evaluation and Treatment: Rehabilitation  THERAPY DIAG:  Muscle weakness (generalized)  Difficulty in walking, not elsewhere classified  Other low back pain  Cramp and spasm  ONSET DATE: years  SUBJECTIVE:  SUBJECTIVE STATEMENT: I'm doing ok today.  My back is just sore.  From Eval: Long history of low back pain. Sees [facility] pain management. In the morning I can hardly get up, but after that I do pretty good. I sit in a lift chair. When I'm working on things I sit in my w/c because I'm afraid of falling.  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 Yes: NPRS scale: 3/10 Pain location: low back Left side worse Pain description: sore and weak Aggravating factors: overhead reaching (putting dishes away) Relieving factors: no  PRECAUTIONS: None  RED FLAGS: None   WEIGHT BEARING RESTRICTIONS: No  FALLS:  Has patient  fallen in last 6 months? No  LIVING ENVIRONMENT: Lives with: lives with their family and lives with their daughter Lives in: House/apartment Stairs: 14 steps with rail on left Has following equipment at home: Dan Humphreys - 2 wheeled, Environmental consultant - 4 wheeled, and Wheelchair (manual)  OCCUPATION: retired  PLOF: Independent  PATIENT GOALS: get moving better, decrease soreness, hard to get undergarments on, getting around in bed  NEXT MD VISIT: after PT  OBJECTIVE:  Note: Objective measures were completed at Evaluation unless otherwise noted.  DIAGNOSTIC FINDINGS:  XR 2023: Findings/Impression:   Segmentation: There are five non-rib-bearing lumbar-type vertebral bodies.  Sclerotic degenerative changes along the L5 inferior and S1 superior  endplate.  Bones: Generalized osteopenia. No displaced fracture or loss of vertebral  body height.  Limited evaluation of the sacrum due to overlying bowel gas.  Alignment: Slight lumbar dextrocurvature. Unchanged slight grade 1  anterolisthesis of L3 on L4 and L4 on L5.  Degenerative changes: Interval progression of severe multilevel lumbar  spondylosis from 2021. Mild bilateral hip and bilateral SI joint  degenerative changes. Pubic symphysis appears maintained.   PATIENT SURVEYS:  Modified Oswestry  27 / 50 = 54.0 %    COGNITION: Overall cognitive status: Within functional limits for tasks assessed     SENSATION: WFL  MUSCLE LENGTH: B HS WNL, R piriformis mild tightness  POSTURE: flexed trunk   PALPATION: Palpation: TTP at gluteals  LUMBAR ROM:   AROM eval  Flexion full  Extension Painful limited 80%  Right lateral flexion Limited and LOB   Left lateral flexion   Right rotation Limited 60%  Left rotation Limited 50%   (Blank rows = not tested)  LOWER EXTREMITY ROM:   WFL for tasks assessed    Right eval Left eval  Hip flexion    Hip extension    Hip abduction    Hip adduction    Hip internal rotation    Hip external  rotation    Knee flexion    Knee extension    Ankle dorsiflexion    Ankle plantarflexion    Ankle inversion    Ankle eversion     (Blank rows = not tested)  LOWER EXTREMITY MMT:    MMT Right eval Left eval  Hip flexion 4- 4  Hip extension    Hip abduction    Hip adduction    Hip internal rotation    Hip external rotation    Knee flexion 5 5  Knee extension 5 5  Ankle dorsiflexion 5 5  Ankle plantarflexion    Ankle inversion    Ankle eversion     (Blank rows = not tested)   FUNCTIONAL TESTS:  5 times sit to stand: 23.47 with B UE  assist 3 minute walk test: 2.5 min 268 ft   TREATMENT DATE:  01/17/24:  NuStep: level 4x 6 minutes-PT present to discuss progress   Sit to stand with UE support and seated on pad 2x10 Seated hamstring stretch 3x20 seconds      Long arc quads: Lt and Rt 5" hold x12-cramp in Rt hamstring  Seated heel/toe raises x20 Seated marching x20 Hamstring curls with red loop 2x10 bil                                                                                                                       01/10/24  See pt ed and HEP    PATIENT EDUCATION:  Education details: PT eval findings, anticipated POC, and initial HEP , [email or web address] Person educated: Patient Education method: Explanation, Demonstration, and Handouts Education comprehension: verbalized understanding and returned demonstration  HOME EXERCISE PROGRAM: Access Code: [email or web address] URL: [email or web address] Date: 01/17/2024 Prepared by: Tresa Endo  Exercises - Supine Bridge  - 2 x daily - 7 x weekly - 1-3 sets - 10 reps - Sit to Stand with Counter Support  - 2 x daily - 3 x weekly - 1 sets - 5 reps - Seated Long Arc Quad  - 3 x daily - 7 x weekly - 2 sets - 10 reps - 5 hold - Seated March   - 3 x daily - 7 x weekly - 3 sets - 10 reps - Seated Heel Toe Raises   - 3 x daily - 7 x weekly - 2 sets - 10 reps - Seated Isometric Hip Adduction with Ball  - 3 x daily - 7 x weekly - 2 sets -  10 reps ASSESSMENT:  CLINICAL IMPRESSION: First time follow-up after evaluation.  Pt denies significant pain today.  She tolerated all activity in the clinic well and PT monitored for safety, pain and technique.  Pt fatigued quickly and required rest breaks with seated exercises.  She will benefit from skilled PT to address these deficits.    OBJECTIVE IMPAIRMENTS: decreased activity tolerance, decreased balance, difficulty walking, decreased ROM, decreased strength, increased muscle spasms, impaired flexibility, postural dysfunction, obesity, and pain.   ACTIVITY LIMITATIONS: carrying, lifting, standing, stairs, bed mobility, dressing, reach over head, hygiene/grooming, and locomotion level  PARTICIPATION LIMITATIONS: meal prep, cleaning, laundry, community activity, and church  PERSONAL FACTORS: Age, Fitness, Past/current experiences, Time since onset of injury/illness/exacerbation, and 3+ comorbidities: CVA x 3, L shoulder OA, obesity  are also affecting patient's functional outcome.   REHAB POTENTIAL: Fair due to multiple comorbidities and frequency of visits  CLINICAL DECISION MAKING: Evolving/moderate complexity  EVALUATION COMPLEXITY: Moderate   GOALS: Goals reviewed with patient? Yes  SHORT TERM GOALS: Target date: 02/07/2024   Patient will be independent with initial HEP.  Baseline:  Goal status: INITIAL  2.  Patient will demonstrate improved LE strength by improving 5XSTS by 2-3 seconds.  Baseline: 23.47 Goal status: INITIAL  3.  Able to complete with RW Baseline: 2.5 min  268 ft Goal status: INITIAL   LONG TERM GOALS: Target date: 03/06/2024   Patient will be independent with advanced/ongoing HEP to improve outcomes and carryover.  Baseline:  Goal status: INITIAL  2.  Patient will report 50% improvement in low back pain with ADLs to improve QOL.  Baseline:  Goal status: INITIAL  3.  Patient will demonstrate improved LE strength by being able climb stairs  with 50% less shoulder pain.   Baseline: relies heavily on L UE Goal status: INITIAL  4.  Patient will report improved ease of donning her undergarments by 50% Baseline: must do in supine bridge position Goal status: INITIAL  5.  Patient will score < = 21 on the Modified Oswestry demonstrating improved functional ability.  Baseline: 27 / 50 Goal status: INITIAL  6.  Patient will demonstrate increased endurance with walking by completing a 5 MWT. Baseline: 2.5 min Goal status: INITIAL    PLAN:  PT FREQUENCY: 2x/week  PT DURATION: 8 weeks  PLANNED INTERVENTIONS: 97164- PT Re-evaluation, 97110-Therapeutic exercises, 97530- Therapeutic activity, [id number]- Neuromuscular re-education, 97535- Self Care, 16109- Manual therapy, [id number]- Gait training, [phone]- Electrical stimulation (unattended), [id number]- Traction (mechanical), Patient/Family education, Stair training, Taping, Dry Needling, Joint mobilization, Spinal mobilization, DME instructions, Cryotherapy, and Moist heat.  PLAN FOR NEXT SESSION: work on functional LE strength (STS, step ups), trial of DN to lumbar/gluteals (pt wants to try this) walking endurance, progress HEP; see functional goals.  Lorrene Reid, PT 01/17/24 1:16 PM

## 2024-01-24 ENCOUNTER — Encounter: Payer: Self-pay | Admitting: Physical Therapy

## 2024-01-24 ENCOUNTER — Ambulatory Visit: Payer: Medicare HMO | Attending: Anesthesiology | Admitting: Physical Therapy

## 2024-01-24 DIAGNOSIS — M6281 Muscle weakness (generalized): Secondary | ICD-10-CM | POA: Insufficient documentation

## 2024-01-24 DIAGNOSIS — R252 Cramp and spasm: Secondary | ICD-10-CM | POA: Insufficient documentation

## 2024-01-24 DIAGNOSIS — M5459 Other low back pain: Secondary | ICD-10-CM | POA: Insufficient documentation

## 2024-01-24 DIAGNOSIS — R2681 Unsteadiness on feet: Secondary | ICD-10-CM | POA: Insufficient documentation

## 2024-01-24 DIAGNOSIS — R262 Difficulty in walking, not elsewhere classified: Secondary | ICD-10-CM | POA: Insufficient documentation

## 2024-01-24 NOTE — Patient Instructions (Signed)

## 2024-01-24 NOTE — Therapy (Signed)
 OUTPATIENT PHYSICAL THERAPY THORACOLUMBAR TREATMENT   Patient Name: Pamela Cox MRN: [id number] DOB:1941-03-28, 83 y.o., female Today's Date: 01/24/2024  END OF SESSION:  PT End of Session - 01/24/24 1357     Visit Number 3    Date for PT Re-Evaluation 03/06/24    Authorization Type [employer] MCR    Progress Note Due on Visit 10    PT Start Time 1233    PT Stop Time 1315    PT Time Calculation (min) 42 min    Activity Tolerance Patient tolerated treatment well    Behavior During Therapy WFL for tasks assessed/performed               Past Medical History:  Diagnosis Date   Arthritis    CML (chronic myelocytic leukemia) (HCC)    CVA (cerebral vascular accident) (HCC)    Diabetes mellitus without complication (HCC)    GERD (gastroesophageal reflux disease)    Hypertension    Myelocytic leukemia, chronic (HCC)    Obesity    Sleep apnea    Past Surgical History:  Procedure Laterality Date   APPENDECTOMY     COLONOSCOPY     COLONOSCOPY WITH PROPOFOL N/A 07/16/2019   Procedure: COLONOSCOPY WITH PROPOFOL;  Surgeon: Christena Deem, MD;  Location: [facility] ENDOSCOPY;  Service: Endoscopy;  Laterality: N/A;   ESOPHAGOGASTRODUODENOSCOPY (EGD) WITH PROPOFOL N/A 07/16/2019   Procedure: ESOPHAGOGASTRODUODENOSCOPY (EGD) WITH PROPOFOL;  Surgeon: Christena Deem, MD;  Location: [facility] ENDOSCOPY;  Service: Endoscopy;  Laterality: N/A;   TOTAL SHOULDER REPLACEMENT Right 2021   TUBAL LIGATION     Patient Active Problem List   Diagnosis Date Noted   Allergic rhinitis due to pollen 08/23/2018   DDD (degenerative disc disease), lumbar 08/23/2018   Lumbar neuritis 08/23/2018   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   GERD (gastroesophageal reflux disease) 08/23/2018   Morbid obesity (HCC) 08/23/2018   Obstructive sleep apnea 08/23/2018   Intervertebral disc disorder with radiculopathy of lumbar region 08/23/2018   CML (chronic myelocytic leukemia) (HCC) 08/23/2018   Hx of myocardial  perfusion scan 08/23/2018   Osteoarthritis 08/23/2018   Degenerative arthritis of hip 08/23/2018   Lumbar spondylosis 08/23/2018   SOB (shortness of breath) 08/23/2018   History of CVA (cerebrovascular accident) 08/23/2018   Essential hypertension 08/23/2018   Chronic right shoulder pain 08/23/2018   Complete tear of right rotator cuff 08/23/2018   Acute pain of left shoulder 08/23/2018   On antineoplastic chemotherapy 08/23/2018   Hyperlipidemia 08/23/2018   Neurogenic claudication due to lumbar spinal stenosis 08/23/2018   Vitamin D deficiency 08/23/2018    [facility]P: [facility]   REFERRING PROVIDER: Tawana Scale, MD   REFERRING DIAG: [phone] (ICD-10-CM) - Spondylosis of lumbar region without myelopathy or radiculopathy  Low back pain, multifidus muscle training   Rationale for Evaluation and Treatment: Rehabilitation  THERAPY DIAG:  Muscle weakness (generalized)  Difficulty in walking, not elsewhere classified  Other low back pain  Cramp and spasm  ONSET DATE: years  SUBJECTIVE:  SUBJECTIVE STATEMENT: Patient reports her back is really sore. She wants to try dry needling  From Eval: Long history of low back pain. Sees [facility] pain management. In the morning I can hardly get up, but after that I do pretty good. I sit in a lift chair. When I'm working on things I sit in my w/c because I'm afraid of falling.  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 Yes: NPRS scale: 3/10 Pain location: low back Left side worse Pain description: sore and weak Aggravating factors: overhead reaching (putting dishes away) Relieving factors: no  PRECAUTIONS: None  RED FLAGS: None   WEIGHT BEARING RESTRICTIONS:  No  FALLS:  Has patient fallen in last 6 months? No  LIVING ENVIRONMENT: Lives with: lives with their family and lives with their daughter Lives in: House/apartment Stairs: 14 steps with rail on left Has following equipment at home: Dan Humphreys - 2 wheeled, Environmental consultant - 4 wheeled, and Wheelchair (manual)  OCCUPATION: retired  PLOF: Independent  PATIENT GOALS: get moving better, decrease soreness, hard to get undergarments on, getting around in bed  NEXT MD VISIT: after PT  OBJECTIVE:  Note: Objective measures were completed at Evaluation unless otherwise noted.  DIAGNOSTIC FINDINGS:  XR 2023: Findings/Impression:   Segmentation: There are five non-rib-bearing lumbar-type vertebral bodies.  Sclerotic degenerative changes along the L5 inferior and S1 superior  endplate.  Bones: Generalized osteopenia. No displaced fracture or loss of vertebral  body height.  Limited evaluation of the sacrum due to overlying bowel gas.  Alignment: Slight lumbar dextrocurvature. Unchanged slight grade 1  anterolisthesis of L3 on L4 and L4 on L5.  Degenerative changes: Interval progression of severe multilevel lumbar  spondylosis from 2021. Mild bilateral hip and bilateral SI joint  degenerative changes. Pubic symphysis appears maintained.   PATIENT SURVEYS:  Modified Oswestry  27 / 50 = 54.0 %    COGNITION: Overall cognitive status: Within functional limits for tasks assessed     SENSATION: WFL  MUSCLE LENGTH: B HS WNL, R piriformis mild tightness  POSTURE: flexed trunk   PALPATION: Palpation: TTP at gluteals  LUMBAR ROM:   AROM eval  Flexion full  Extension Painful limited 80%  Right lateral flexion Limited and LOB   Left lateral flexion   Right rotation Limited 60%  Left rotation Limited 50%   (Blank rows = not tested)  LOWER EXTREMITY ROM:   WFL for tasks assessed    Right eval Left eval  Hip flexion    Hip extension    Hip abduction    Hip adduction    Hip internal  rotation    Hip external rotation    Knee flexion    Knee extension    Ankle dorsiflexion    Ankle plantarflexion    Ankle inversion    Ankle eversion     (Blank rows = not tested)  LOWER EXTREMITY MMT:    MMT Right eval Left eval  Hip flexion 4- 4  Hip extension    Hip abduction    Hip adduction    Hip internal rotation    Hip external rotation    Knee flexion 5 5  Knee extension 5 5  Ankle dorsiflexion 5 5  Ankle plantarflexion    Ankle inversion    Ankle eversion     (Blank rows = not tested)   FUNCTIONAL TESTS:  5 times sit to stand: 23.47  with B UE assist 3 minute walk test: 2.5 min 268 ft   TREATMENT DATE:  01/24/24:  NuStep: level 2x 5 minutes-PT present to discuss progress (needed rest breaks in between) Airex step ups x 8 with bilateral UE support (patient was very fatigued by this) Seated LAQ 2# AW x 10 bilateral  Seated marching 2# AW x 10 bilateral Seated heel / toe raises 2# AW x 10 bilateral Hamstring curls with red loop 2x10 bil  Trigger Point Dry Needling  Initial Treatment: Pt instructed on Dry Needling rational, procedures, and possible side effects. Pt instructed to expect mild to moderate muscle soreness later in the day and/or into the next day.  Pt instructed in methods to reduce muscle soreness. Pt instructed to continue prescribed HEP.  Patient Verbal Consent Given: Yes Education Handout Provided: Yes Muscles Treated: Lt lumbar multifidi; Lt iliocostalis lumborum Electrical Stimulation Performed: No Treatment Response/Outcome: Utilized skilled palpation to identify trigger points.  During dry needling able to palpate muscle twitch and muscle elongation   Manual STM after dry needling     01/17/24:  NuStep: level 4x 6 minutes-PT present to discuss progress   Sit to stand with UE support and seated on pad 2x10 Seated hamstring stretch 3x20 seconds      Long arc quads: Lt and Rt 5" hold x12-cramp in Rt hamstring  Seated heel/toe  raises x20 Seated marching x20 Hamstring curls with red loop 2x10 bil                                                                                                                       01/10/24  See pt ed and HEP    PATIENT EDUCATION:  Education details: PT eval findings, anticipated POC, and initial HEP , [email or web address] Person educated: Patient Education method: Explanation, Demonstration, and Handouts Education comprehension: verbalized understanding and returned demonstration  HOME EXERCISE PROGRAM: Access Code: [email or web address] URL: [email or web address] Date: 01/17/2024 Prepared by: Tresa Endo  Exercises - Supine Bridge  - 2 x daily - 7 x weekly - 1-3 sets - 10 reps - Sit to Stand with Counter Support  - 2 x daily - 3 x weekly - 1 sets - 5 reps - Seated Long Arc Quad  - 3 x daily - 7 x weekly - 2 sets - 10 reps - 5 hold - Seated March   - 3 x daily - 7 x weekly - 3 sets - 10 reps - Seated Heel Toe Raises   - 3 x daily - 7 x weekly - 2 sets - 10 reps - Seated Isometric Hip Adduction with Ball  - 3 x daily - 7 x weekly - 2 sets - 10 reps ASSESSMENT:  CLINICAL IMPRESSION: Pamela Cox presents to therapy with some back soreness. Noted increased fatigue with standing exercises and requested more sitting exercises. Patient verbalized wanting to try a trial of dry needling to help her back and hip pain. With dry needling palpable muscle twitch felt during technique.   OBJECTIVE  IMPAIRMENTS: decreased activity tolerance, decreased balance, difficulty walking, decreased ROM, decreased strength, increased muscle spasms, impaired flexibility, postural dysfunction, obesity, and pain.   ACTIVITY LIMITATIONS: carrying, lifting, standing, stairs, bed mobility, dressing, reach over head, hygiene/grooming, and locomotion level  PARTICIPATION LIMITATIONS: meal prep, cleaning, laundry, community activity, and church  PERSONAL FACTORS: Age, Fitness, Past/current experiences, Time since onset of  injury/illness/exacerbation, and 3+ comorbidities: CVA x 3, L shoulder OA, obesity  are also affecting patient's functional outcome.   REHAB POTENTIAL: Fair due to multiple comorbidities and frequency of visits  CLINICAL DECISION MAKING: Evolving/moderate complexity  EVALUATION COMPLEXITY: Moderate   GOALS: Goals reviewed with patient? Yes  SHORT TERM GOALS: Target date: 02/07/2024   Patient will be independent with initial HEP.  Baseline:  Goal status: INITIAL  2.  Patient will demonstrate improved LE strength by improving 5XSTS by 2-3 seconds.  Baseline: 23.47 Goal status: INITIAL  3.  Able to complete with RW Baseline: 2.5 min 268 ft Goal status: INITIAL   LONG TERM GOALS: Target date: 03/06/2024   Patient will be independent with advanced/ongoing HEP to improve outcomes and carryover.  Baseline:  Goal status: INITIAL  2.  Patient will report 50% improvement in low back pain with ADLs to improve QOL.  Baseline:  Goal status: INITIAL  3.  Patient will demonstrate improved LE strength by being able climb stairs with 50% less shoulder pain.   Baseline: relies heavily on L UE Goal status: INITIAL  4.  Patient will report improved ease of donning her undergarments by 50% Baseline: must do in supine bridge position Goal status: INITIAL  5.  Patient will score < = 21 on the Modified Oswestry demonstrating improved functional ability.  Baseline: 27 / 50 Goal status: INITIAL  6.  Patient will demonstrate increased endurance with walking by completing a 5 MWT. Baseline: 2.5 min Goal status: INITIAL    PLAN:  PT FREQUENCY: 2x/week  PT DURATION: 8 weeks  PLANNED INTERVENTIONS: 97164- PT Re-evaluation, 97110-Therapeutic exercises, 97530- Therapeutic activity, [id number]- Neuromuscular re-education, 97535- Self Care, [id number]- Manual therapy, [id number]- Gait training, [phone]- Electrical stimulation (unattended), [phone]- Traction (mechanical), Patient/Family education, Stair  training, Taping, Dry Needling, Joint mobilization, Spinal mobilization, DME instructions, Cryotherapy, and Moist heat.  PLAN FOR NEXT SESSION: assess DN 1 ; continue  functional LE strength (STS, step ups),walking endurance, progress HEP  Claude Manges, PT 01/24/24 1:58 PM

## 2024-01-31 ENCOUNTER — Ambulatory Visit: Payer: Medicare HMO

## 2024-01-31 DIAGNOSIS — M6281 Muscle weakness (generalized): Secondary | ICD-10-CM

## 2024-01-31 DIAGNOSIS — R262 Difficulty in walking, not elsewhere classified: Secondary | ICD-10-CM

## 2024-01-31 DIAGNOSIS — M5459 Other low back pain: Secondary | ICD-10-CM

## 2024-01-31 DIAGNOSIS — R252 Cramp and spasm: Secondary | ICD-10-CM

## 2024-01-31 NOTE — Therapy (Signed)
 OUTPATIENT PHYSICAL THERAPY THORACOLUMBAR TREATMENT   Patient Name: Pamela Cox MRN: [id number] DOB:11-11-1941, 83 y.o., female Today's Date: 01/31/2024  END OF SESSION:  PT End of Session - 01/31/24 1322     Visit Number 4    Date for PT Re-Evaluation 03/06/24    Authorization Type [employer] MCR    Progress Note Due on Visit 10    PT Start Time 1235    PT Stop Time 1320    PT Time Calculation (min) 45 min    Activity Tolerance Patient tolerated treatment well    Behavior During Therapy WFL for tasks assessed/performed                Past Medical History:  Diagnosis Date   Arthritis    CML (chronic myelocytic leukemia) (HCC)    CVA (cerebral vascular accident) (HCC)    Diabetes mellitus without complication (HCC)    GERD (gastroesophageal reflux disease)    Hypertension    Myelocytic leukemia, chronic (HCC)    Obesity    Sleep apnea    Past Surgical History:  Procedure Laterality Date   APPENDECTOMY     COLONOSCOPY     COLONOSCOPY WITH PROPOFOL N/A 07/16/2019   Procedure: COLONOSCOPY WITH PROPOFOL;  Surgeon: Christena Deem, MD;  Location: [facility] ENDOSCOPY;  Service: Endoscopy;  Laterality: N/A;   ESOPHAGOGASTRODUODENOSCOPY (EGD) WITH PROPOFOL N/A 07/16/2019   Procedure: ESOPHAGOGASTRODUODENOSCOPY (EGD) WITH PROPOFOL;  Surgeon: Christena Deem, MD;  Location: [facility] ENDOSCOPY;  Service: Endoscopy;  Laterality: N/A;   TOTAL SHOULDER REPLACEMENT Right 2021   TUBAL LIGATION     Patient Active Problem List   Diagnosis Date Noted   Allergic rhinitis due to pollen 08/23/2018   DDD (degenerative disc disease), lumbar 08/23/2018   Lumbar neuritis 08/23/2018   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   GERD (gastroesophageal reflux disease) 08/23/2018   Morbid obesity (HCC) 08/23/2018   Obstructive sleep apnea 08/23/2018   Intervertebral disc disorder with radiculopathy of lumbar region 08/23/2018   CML (chronic myelocytic leukemia) (HCC) 08/23/2018   Hx of myocardial  perfusion scan 08/23/2018   Osteoarthritis 08/23/2018   Degenerative arthritis of hip 08/23/2018   Lumbar spondylosis 08/23/2018   SOB (shortness of breath) 08/23/2018   History of CVA (cerebrovascular accident) 08/23/2018   Essential hypertension 08/23/2018   Chronic right shoulder pain 08/23/2018   Complete tear of right rotator cuff 08/23/2018   Acute pain of left shoulder 08/23/2018   On antineoplastic chemotherapy 08/23/2018   Hyperlipidemia 08/23/2018   Neurogenic claudication due to lumbar spinal stenosis 08/23/2018   Vitamin D deficiency 08/23/2018    [facility]: [facility]   REFERRING PROVIDER: Tawana Scale, MD   REFERRING DIAG: [phone] (ICD-10-CM) - Spondylosis of lumbar region without myelopathy or radiculopathy  Low back pain, multifidus muscle training   Rationale for Evaluation and Treatment: Rehabilitation  THERAPY DIAG:  Muscle weakness (generalized)  Difficulty in walking, not elsewhere classified  Other low back pain  Cramp and spasm  ONSET DATE: years  SUBJECTIVE:  SUBJECTIVE STATEMENT: I am really hurting today.   I want to try dry needling again, it didn't help much but I think I want to do it again    From Eval: Long history of low back pain. Sees [facility] pain management. In the morning I can hardly get up, but after that I do pretty good. I sit in a lift chair. When I'm working on things I sit in my w/c because I'm afraid of falling.  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 Yes: NPRS scale: 10/10 Pain location: low back Left side worse Pain description: sore and weak Aggravating factors: overhead reaching (putting dishes away) Relieving factors: no  PRECAUTIONS:  None  RED FLAGS: None   WEIGHT BEARING RESTRICTIONS: No  FALLS:  Has patient fallen in last 6 months? No  LIVING ENVIRONMENT: Lives with: lives with their family and lives with their daughter Lives in: House/apartment Stairs: 14 steps with rail on left Has following equipment at home: Dan Humphreys - 2 wheeled, Environmental consultant - 4 wheeled, and Wheelchair (manual)  OCCUPATION: retired  PLOF: Independent  PATIENT GOALS: get moving better, decrease soreness, hard to get undergarments on, getting around in bed  NEXT MD VISIT: after PT  OBJECTIVE:  Note: Objective measures were completed at Evaluation unless otherwise noted.  DIAGNOSTIC FINDINGS:  XR 2023: Findings/Impression:   Segmentation: There are five non-rib-bearing lumbar-type vertebral bodies.  Sclerotic degenerative changes along the L5 inferior and S1 superior  endplate.  Bones: Generalized osteopenia. No displaced fracture or loss of vertebral  body height.  Limited evaluation of the sacrum due to overlying bowel gas.  Alignment: Slight lumbar dextrocurvature. Unchanged slight grade 1  anterolisthesis of L3 on L4 and L4 on L5.  Degenerative changes: Interval progression of severe multilevel lumbar  spondylosis from 2021. Mild bilateral hip and bilateral SI joint  degenerative changes. Pubic symphysis appears maintained.   PATIENT SURVEYS:  Modified Oswestry  27 / 50 = 54.0 %    COGNITION: Overall cognitive status: Within functional limits for tasks assessed     SENSATION: WFL  MUSCLE LENGTH: B HS WNL, R piriformis mild tightness  POSTURE: flexed trunk   PALPATION: Palpation: TTP at gluteals  LUMBAR ROM:   AROM eval  Flexion full  Extension Painful limited 80%  Right lateral flexion Limited and LOB   Left lateral flexion   Right rotation Limited 60%  Left rotation Limited 50%   (Blank rows = not tested)  LOWER EXTREMITY ROM:   WFL for tasks assessed    Right eval Left eval  Hip flexion    Hip  extension    Hip abduction    Hip adduction    Hip internal rotation    Hip external rotation    Knee flexion    Knee extension    Ankle dorsiflexion    Ankle plantarflexion    Ankle inversion    Ankle eversion     (Blank rows = not tested)  LOWER EXTREMITY MMT:    MMT Right eval Left eval  Hip flexion 4- 4  Hip extension    Hip abduction    Hip adduction    Hip internal rotation    Hip external rotation    Knee flexion 5 5  Knee extension 5 5  Ankle dorsiflexion 5 5  Ankle plantarflexion    Ankle inversion    Ankle eversion     (  Blank rows = not tested)   FUNCTIONAL TESTS:  5 times sit to stand: 23.47 with B UE assist 3 minute walk test: 2.5 min 268 ft   TREATMENT DATE:  01/31/24:  NuStep: level 4x 6 minutes-PT present to discuss progress Supine on heat: Trunk rotation x10 bil Knee to chest: with pillow case 2x20 seconds  Pelvic tilt x10 Trigger Point Dry Needling  Subsequent Treatment: Instructions provided previously at initial dry needling treatment.   Patient Verbal Consent Given: Yes Education Handout Provided: Previously Provided Muscles Treated: Bil lumbar multifidi and gluteals  Electrical Stimulation Performed: No Treatment Response/Outcome: twitch and improved tissue mobility  Manual: elongation and release to low back and gluteals after dry needling   01/24/24:  NuStep: level 2x 5 minutes-PT present to discuss progress (needed rest breaks in between) Airex step ups x 8 with bilateral UE support (patient was very fatigued by this) Seated LAQ 2# AW x 10 bilateral  Seated marching 2# AW x 10 bilateral Seated heel / toe raises 2# AW x 10 bilateral Hamstring curls with red loop 2x10 bil  Trigger Point Dry Needling  Initial Treatment: Pt instructed on Dry Needling rational, procedures, and possible side effects. Pt instructed to expect mild to moderate muscle soreness later in the day and/or into the next day.  Pt instructed in methods to reduce  muscle soreness. Pt instructed to continue prescribed HEP.  Patient Verbal Consent Given: Yes Education Handout Provided: Yes Muscles Treated: Lt lumbar multifidi; Lt iliocostalis lumborum Electrical Stimulation Performed: No Treatment Response/Outcome: Utilized skilled palpation to identify trigger points.  During dry needling able to palpate muscle twitch and muscle elongation   Manual STM after dry needling     01/17/24:  NuStep: level 4x 6 minutes-PT present to discuss progress   Sit to stand with UE support and seated on pad 2x10 Seated hamstring stretch 3x20 seconds      Long arc quads: Lt and Rt 5" hold x12-cramp in Rt hamstring  Seated heel/toe raises x20 Seated marching x20 Hamstring curls with red loop 2x10 bil                                                                                                                        PATIENT EDUCATION:  Education details: PT eval findings, anticipated POC, and initial HEP , [email or web address] Person educated: Patient Education method: Explanation, Demonstration, and Handouts Education comprehension: verbalized understanding and returned demonstration  HOME EXERCISE PROGRAM: Access Code: [email or web address] URL: [email or web address] Date: 01/17/2024 Prepared by: Tresa Endo  Exercises - Supine Bridge  - 2 x daily - 7 x weekly - 1-3 sets - 10 reps - Sit to Stand with Counter Support  - 2 x daily - 3 x weekly - 1 sets - 5 reps - Seated Long Arc Quad  - 3 x daily - 7 x weekly - 2 sets - 10 reps - 5 hold - Seated March   - 3 x daily - 7 x weekly - 3 sets -  10 reps - Seated Heel Toe Raises   - 3 x daily - 7 x weekly - 2 sets - 10 reps - Seated Isometric Hip Adduction with Ball  - 3 x daily - 7 x weekly - 2 sets - 10 reps ASSESSMENT:  CLINICAL IMPRESSION: Pt arrived with 10/10 lumbar pain.  Pt participated in gentle exercises and this was paired with heat in supine.  Pt wanted to try dry needling again to see if it would help. PT  instructed her to use TENS as home for pain management.  Good result with dry needling with twitch and reported 5/10 pain post session.  Patient will benefit from skilled PT to address the below impairments and improve overall function.   OBJECTIVE IMPAIRMENTS: decreased activity tolerance, decreased balance, difficulty walking, decreased ROM, decreased strength, increased muscle spasms, impaired flexibility, postural dysfunction, obesity, and pain.   ACTIVITY LIMITATIONS: carrying, lifting, standing, stairs, bed mobility, dressing, reach over head, hygiene/grooming, and locomotion level  PARTICIPATION LIMITATIONS: meal prep, cleaning, laundry, community activity, and church  PERSONAL FACTORS: Age, Fitness, Past/current experiences, Time since onset of injury/illness/exacerbation, and 3+ comorbidities: CVA x 3, L shoulder OA, obesity  are also affecting patient's functional outcome.   REHAB POTENTIAL: Fair due to multiple comorbidities and frequency of visits  CLINICAL DECISION MAKING: Evolving/moderate complexity  EVALUATION COMPLEXITY: Moderate   GOALS: Goals reviewed with patient? Yes  SHORT TERM GOALS: Target date: 02/07/2024   Patient will be independent with initial HEP.  Baseline:  Goal status: MET  2.  Patient will demonstrate improved LE strength by improving 5XSTS by 2-3 seconds.  Baseline: 23.47 Goal status: INITIAL  3.  Able to complete with RW Baseline: 2.5 min 268 ft Goal status: INITIAL   LONG TERM GOALS: Target date: 03/06/2024   Patient will be independent with advanced/ongoing HEP to improve outcomes and carryover.  Baseline:  Goal status: INITIAL  2.  Patient will report 50% improvement in low back pain with ADLs to improve QOL.  Baseline:  Goal status: INITIAL  3.  Patient will demonstrate improved LE strength by being able climb stairs with 50% less shoulder pain.   Baseline: relies heavily on L UE Goal status: INITIAL  4.  Patient will report  improved ease of donning her undergarments by 50% Baseline: must do in supine bridge position Goal status: INITIAL  5.  Patient will score < = 21 on the Modified Oswestry demonstrating improved functional ability.  Baseline: 27 / 50 Goal status: INITIAL  6.  Patient will demonstrate increased endurance with walking by completing a 5 MWT. Baseline: 2.5 min Goal status: INITIAL    PLAN:  PT FREQUENCY: 2x/week  PT DURATION: 8 weeks  PLANNED INTERVENTIONS: 97164- PT Re-evaluation, 97110-Therapeutic exercises, 97530- Therapeutic activity, [id number]- Neuromuscular re-education, 97535- Self Care, 16109- Manual therapy, [id number]- Gait training, [phone]- Electrical stimulation (unattended), [phone]- Traction (mechanical), Patient/Family education, Stair training, Taping, Dry Needling, Joint mobilization, Spinal mobilization, DME instructions, Cryotherapy, and Moist heat.  PLAN FOR NEXT SESSION: assess DN 2 ; continue  functional LE strength (STS, step ups),walking endurance, progress HEP as able   Lorrene Reid, PT 01/31/24 1:22 PM

## 2024-02-02 NOTE — Therapy (Signed)
 OUTPATIENT PHYSICAL THERAPY THORACOLUMBAR TREATMENT   Patient Name: Pamela Cox MRN: [id number] DOB:08-21-1941, 83 y.o., female Today's Date: 02/03/2024  END OF SESSION:  PT End of Session - 02/03/24 1121     Visit Number 5    Date for PT Re-Evaluation 03/06/24    Authorization Type [employer] MCR    Progress Note Due on Visit 10    PT Start Time 1020    PT Stop Time 1108    PT Time Calculation (min) 48 min                 Past Medical History:  Diagnosis Date   Arthritis    CML (chronic myelocytic leukemia) (HCC)    CVA (cerebral vascular accident) (HCC)    Diabetes mellitus without complication (HCC)    GERD (gastroesophageal reflux disease)    Hypertension    Myelocytic leukemia, chronic (HCC)    Obesity    Sleep apnea    Past Surgical History:  Procedure Laterality Date   APPENDECTOMY     COLONOSCOPY     COLONOSCOPY WITH PROPOFOL N/A 07/16/2019   Procedure: COLONOSCOPY WITH PROPOFOL;  Surgeon: Christena Deem, MD;  Location: [facility] ENDOSCOPY;  Service: Endoscopy;  Laterality: N/A;   ESOPHAGOGASTRODUODENOSCOPY (EGD) WITH PROPOFOL N/A 07/16/2019   Procedure: ESOPHAGOGASTRODUODENOSCOPY (EGD) WITH PROPOFOL;  Surgeon: Christena Deem, MD;  Location: [facility] ENDOSCOPY;  Service: Endoscopy;  Laterality: N/A;   TOTAL SHOULDER REPLACEMENT Right 2021   TUBAL LIGATION     Patient Active Problem List   Diagnosis Date Noted   Allergic rhinitis due to pollen 08/23/2018   DDD (degenerative disc disease), lumbar 08/23/2018   Lumbar neuritis 08/23/2018   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   GERD (gastroesophageal reflux disease) 08/23/2018   Morbid obesity (HCC) 08/23/2018   Obstructive sleep apnea 08/23/2018   Intervertebral disc disorder with radiculopathy of lumbar region 08/23/2018   CML (chronic myelocytic leukemia) (HCC) 08/23/2018   Hx of myocardial perfusion scan 08/23/2018   Osteoarthritis 08/23/2018   Degenerative arthritis of hip 08/23/2018   Lumbar  spondylosis 08/23/2018   SOB (shortness of breath) 08/23/2018   History of CVA (cerebrovascular accident) 08/23/2018   Essential hypertension 08/23/2018   Chronic right shoulder pain 08/23/2018   Complete tear of right rotator cuff 08/23/2018   Acute pain of left shoulder 08/23/2018   On antineoplastic chemotherapy 08/23/2018   Hyperlipidemia 08/23/2018   Neurogenic claudication due to lumbar spinal stenosis 08/23/2018   Vitamin D deficiency 08/23/2018    PCP: [facility]   REFERRING PROVIDER: Tawana Scale, MD   REFERRING DIAG: [phone] (ICD-10-CM) - Spondylosis of lumbar region without myelopathy or radiculopathy  Low back pain, multifidus muscle training   Rationale for Evaluation and Treatment: Rehabilitation  THERAPY DIAG:  Muscle weakness (generalized)  Other low back pain  Cramp and spasm  Unsteadiness on feet  ONSET DATE: years  SUBJECTIVE:  SUBJECTIVE STATEMENT: Some days are better than others. The needling helped some.   From Eval: Long history of low back pain. Sees [facility]. In the morning I can hardly get up, but after that I do pretty good. I sit in a lift chair. When I'm working on things I sit in my w/c because I'm afraid of falling.  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 Yes: NPRS scale: 10/10 Pain location: low back Left side worse Pain description: sore and weak Aggravating factors: overhead reaching (putting dishes away) Relieving factors: no  PRECAUTIONS: None  RED FLAGS: None   WEIGHT BEARING RESTRICTIONS: No  FALLS:  Has patient fallen in last 6 months? No  LIVING ENVIRONMENT: Lives with: lives with their family and lives with their  daughter Lives in: House/apartment Stairs: 14 steps with rail on left Has following equipment at home: Dan Humphreys - 2 wheeled, Environmental consultant - 4 wheeled, and Wheelchair (manual)  OCCUPATION: retired  PLOF: Independent  PATIENT GOALS: get moving better, decrease soreness, hard to get undergarments on, getting around in bed  NEXT MD VISIT: after PT  OBJECTIVE:  Note: Objective measures were completed at Evaluation unless otherwise noted.  DIAGNOSTIC FINDINGS:  XR 2023: Findings/Impression:   Segmentation: There are five non-rib-bearing lumbar-type vertebral bodies.  Sclerotic degenerative changes along the L5 inferior and S1 superior  endplate.  Bones: Generalized osteopenia. No displaced fracture or loss of vertebral  body height.  Limited evaluation of the sacrum due to overlying bowel gas.  Alignment: Slight lumbar dextrocurvature. Unchanged slight grade 1  anterolisthesis of L3 on L4 and L4 on L5.  Degenerative changes: Interval progression of severe multilevel lumbar  spondylosis from 2021. Mild bilateral hip and bilateral SI joint  degenerative changes. Pubic symphysis appears maintained.   PATIENT SURVEYS:  Modified Oswestry  27 / 50 = 54.0 %    COGNITION: Overall cognitive status: Within functional limits for tasks assessed     SENSATION: WFL  MUSCLE LENGTH: B HS WNL, R piriformis mild tightness  POSTURE: flexed trunk   PALPATION: Palpation: TTP at gluteals  LUMBAR ROM:   AROM eval  Flexion full  Extension Painful limited 80%  Right lateral flexion Limited and LOB   Left lateral flexion   Right rotation Limited 60%  Left rotation Limited 50%   (Blank rows = not tested)  LOWER EXTREMITY ROM:   WFL for tasks assessed    Right eval Left eval  Hip flexion    Hip extension    Hip abduction    Hip adduction    Hip internal rotation    Hip external rotation    Knee flexion    Knee extension    Ankle dorsiflexion    Ankle plantarflexion    Ankle inversion     Ankle eversion     (Blank rows = not tested)  LOWER EXTREMITY MMT:    MMT Right eval Left eval  Hip flexion 4- 4  Hip extension    Hip abduction    Hip adduction    Hip internal rotation    Hip external rotation    Knee flexion 5 5  Knee extension 5 5  Ankle dorsiflexion 5 5  Ankle plantarflexion    Ankle inversion    Ankle eversion     (Blank rows = not tested)   FUNCTIONAL TESTS:  5 times sit to stand: 23.47 with B  UE assist 3 minute walk test: 2.5 min 268 ft   TREATMENT DATE:  02/03/24:  NuStep: level 5x 2.5 min, then L 3 x 4.5 min-PT present to discuss progress (needed one short break in between) Airex step ups x 10 with bilateral UE support (challenging) Step ups 6 inch step L x 10 Seated LAQ 2# AW x 10 bilateral  Seated marching 2# AW x 10 bilateral Standing heel raises 2# x 15 Seated heel/ toe raises 2# AW x 10 bilateral Gait: to Cancer gym and back - cues to not let walker get too far ahead  Self care: Educated patient on use of TENS unit and option to keep electrodes on when not in use and just connect to the unit as needed. (Pt alone during the day and can't reach behind to apply electrodes independently)   01/31/24:  NuStep: level 4x 6 minutes-PT present to discuss progress Supine on heat: Trunk rotation x10 bil Knee to chest: with pillow case 2x20 seconds  Pelvic tilt x10 Trigger Point Dry Needling  Subsequent Treatment: Instructions provided previously at initial dry needling treatment.   Patient Verbal Consent Given: Yes Education Handout Provided: Previously Provided Muscles Treated: Bil lumbar multifidi and gluteals  Electrical Stimulation Performed: No Treatment Response/Outcome: twitch and improved tissue mobility  Manual: elongation and release to low back and gluteals after dry needling   01/24/24:  NuStep: level 2x 5 minutes-PT present to discuss progress (needed rest breaks in between) Airex step ups x 8 with bilateral UE support  (patient was very fatigued by this) Seated LAQ 2# AW x 10 bilateral  Seated marching 2# AW x 10 bilateral Seated heel / toe raises 2# AW x 10 bilateral Hamstring curls with red loop 2x10 bil  Trigger Point Dry Needling  Initial Treatment: Pt instructed on Dry Needling rational, procedures, and possible side effects. Pt instructed to expect mild to moderate muscle soreness later in the day and/or into the next day.  Pt instructed in methods to reduce muscle soreness. Pt instructed to continue prescribed HEP.  Patient Verbal Consent Given: Yes Education Handout Provided: Yes Muscles Treated: Lt lumbar multifidi; Lt iliocostalis lumborum Electrical Stimulation Performed: No Treatment Response/Outcome: Utilized skilled palpation to identify trigger points.  During dry needling able to palpate muscle twitch and muscle elongation   Manual STM after dry needling     01/17/24:  NuStep: level 4x 6 minutes-PT present to discuss progress   Sit to stand with UE support and seated on pad 2x10 Seated hamstring stretch 3x20 seconds      Long arc quads: Lt and Rt 5" hold x12-cramp in Rt hamstring  Seated heel/toe raises x20 Seated marching x20 Hamstring curls with red loop 2x10 bil                                                                                                                        PATIENT EDUCATION:  Education details: PT eval findings, anticipated POC, and initial HEP ,  [email or web address] Person educated: Patient Education method: Explanation, Demonstration, and Handouts Education comprehension: verbalized understanding and returned demonstration  HOME EXERCISE PROGRAM: Access Code: [email or web address] URL: [email or web address] Date: 01/17/2024 Prepared by: Tresa Endo  Exercises - Supine Bridge  - 2 x daily - 7 x weekly - 1-3 sets - 10 reps - Sit to Stand with Counter Support  - 2 x daily - 3 x weekly - 1 sets - 5 reps - Seated Long Arc Quad  - 3 x daily - 7 x weekly - 2 sets -  10 reps - 5 hold - Seated March   - 3 x daily - 7 x weekly - 3 sets - 10 reps - Seated Heel Toe Raises   - 3 x daily - 7 x weekly - 2 sets - 10 reps - Seated Isometric Hip Adduction with Ball  - 3 x daily - 7 x weekly - 2 sets - 10 reps ASSESSMENT:  CLINICAL IMPRESSION: Ms. Olesen reports decreased pain since last visit. She is challenged by step ups to airex and 6 inch step and needed to sit after the latter. We did not return to perform opposite side secondary to time. She needs some cueing with LAQ and standing heel raises to avoid using trunk/hip movement for momentum. Patient brought TENS unit to review correct usage. She is limited with applying the electrodes due to R shoulder restrictions. Discussed leaving electrodes on and unhooking leads from the unit when not in use with patient and her daughter. She had relief from DN and would like to try it again next visit.  OBJECTIVE IMPAIRMENTS: decreased activity tolerance, decreased balance, difficulty walking, decreased ROM, decreased strength, increased muscle spasms, impaired flexibility, postural dysfunction, obesity, and pain.   ACTIVITY LIMITATIONS: carrying, lifting, standing, stairs, bed mobility, dressing, reach over head, hygiene/grooming, and locomotion level  PARTICIPATION LIMITATIONS: meal prep, cleaning, laundry, community activity, and church  PERSONAL FACTORS: Age, Fitness, Past/current experiences, Time since onset of injury/illness/exacerbation, and 3+ comorbidities: CVA x 3, L shoulder OA, obesity  are also affecting patient's functional outcome.   REHAB POTENTIAL: Fair due to multiple comorbidities and frequency of visits  CLINICAL DECISION MAKING: Evolving/moderate complexity  EVALUATION COMPLEXITY: Moderate   GOALS: Goals reviewed with patient? Yes  SHORT TERM GOALS: Target date: 02/07/2024   Patient will be independent with initial HEP.  Baseline:  Goal status: MET  2.  Patient will demonstrate improved LE  strength by improving 5XSTS by 2-3 seconds.  Baseline: 23.47 Goal status: INITIAL  3.  Able to complete with RW Baseline: 2.5 min 268 ft Goal status: INITIAL   LONG TERM GOALS: Target date: 03/06/2024   Patient will be independent with advanced/ongoing HEP to improve outcomes and carryover.  Baseline:  Goal status: INITIAL  2.  Patient will report 50% improvement in low back pain with ADLs to improve QOL.  Baseline:  Goal status: INITIAL  3.  Patient will demonstrate improved LE strength by being able climb stairs with 50% less shoulder pain.   Baseline: relies heavily on L UE Goal status: INITIAL  4.  Patient will report improved ease of donning her undergarments by 50% Baseline: must do in supine bridge position Goal status: INITIAL  5.  Patient will score < = 21 on the Modified Oswestry demonstrating improved functional ability.  Baseline: 27 / 50 Goal status: INITIAL  6.  Patient will demonstrate increased endurance with walking by completing a 5 MWT. Baseline: 2.5 min Goal status: INITIAL    PLAN:  PT FREQUENCY: 2x/week  PT DURATION: 8 weeks  PLANNED INTERVENTIONS: 97164- PT Re-evaluation, 97110-Therapeutic exercises, 97530- Therapeutic activity, [id number]- Neuromuscular re-education, 97535- Self Care, [id number]- Manual therapy, [phone]- Gait training, [phone]- Electrical stimulation (unattended), [phone]- Traction (mechanical), Patient/Family education, Stair training, Taping, Dry Needling, Joint mobilization, Spinal mobilization, DME instructions, Cryotherapy, and Moist heat.  PLAN FOR NEXT SESSION: DN next visit ; continue functional LE strength (STS, step ups),walking endurance, progress HEP as able   Solon Palm, PT 02/03/24 11:23 AM

## 2024-02-03 ENCOUNTER — Encounter: Payer: Self-pay | Admitting: Physical Therapy

## 2024-02-03 ENCOUNTER — Ambulatory Visit: Payer: Medicare HMO | Admitting: Physical Therapy

## 2024-02-03 DIAGNOSIS — R252 Cramp and spasm: Secondary | ICD-10-CM

## 2024-02-03 DIAGNOSIS — M6281 Muscle weakness (generalized): Secondary | ICD-10-CM

## 2024-02-03 DIAGNOSIS — R2681 Unsteadiness on feet: Secondary | ICD-10-CM

## 2024-02-03 DIAGNOSIS — M5459 Other low back pain: Secondary | ICD-10-CM

## 2024-02-10 ENCOUNTER — Ambulatory Visit: Admitting: Rehabilitative and Restorative Service Providers"

## 2024-02-10 ENCOUNTER — Encounter: Payer: Self-pay | Admitting: Rehabilitative and Restorative Service Providers"

## 2024-02-10 DIAGNOSIS — M6281 Muscle weakness (generalized): Secondary | ICD-10-CM

## 2024-02-10 DIAGNOSIS — R262 Difficulty in walking, not elsewhere classified: Secondary | ICD-10-CM

## 2024-02-10 DIAGNOSIS — R252 Cramp and spasm: Secondary | ICD-10-CM

## 2024-02-10 DIAGNOSIS — M5459 Other low back pain: Secondary | ICD-10-CM

## 2024-02-10 NOTE — Therapy (Signed)
 OUTPATIENT PHYSICAL THERAPY THORACOLUMBAR TREATMENT   Patient Name: Pamela Cox MRN: [id number] DOB:1941-05-01, 83 y.o., female Today's Date: 02/10/2024  END OF SESSION:  PT End of Session - 02/10/24 1018     Visit Number 6    Date for PT Re-Evaluation 03/06/24    Authorization Type [employer] MCR    Progress Note Due on Visit 10    PT Start Time 1015    PT Stop Time 1055    PT Time Calculation (min) 40 min    Activity Tolerance Patient tolerated treatment well    Behavior During Therapy WFL for tasks assessed/performed                 Past Medical History:  Diagnosis Date   Arthritis    CML (chronic myelocytic leukemia) (HCC)    CVA (cerebral vascular accident) (HCC)    Diabetes mellitus without complication (HCC)    GERD (gastroesophageal reflux disease)    Hypertension    Myelocytic leukemia, chronic (HCC)    Obesity    Sleep apnea    Past Surgical History:  Procedure Laterality Date   APPENDECTOMY     COLONOSCOPY     COLONOSCOPY WITH PROPOFOL N/A 07/16/2019   Procedure: COLONOSCOPY WITH PROPOFOL;  Surgeon: Christena Deem, MD;  Location: [facility] ENDOSCOPY;  Service: Endoscopy;  Laterality: N/A;   ESOPHAGOGASTRODUODENOSCOPY (EGD) WITH PROPOFOL N/A 07/16/2019   Procedure: ESOPHAGOGASTRODUODENOSCOPY (EGD) WITH PROPOFOL;  Surgeon: Christena Deem, MD;  Location: [facility] ENDOSCOPY;  Service: Endoscopy;  Laterality: N/A;   TOTAL SHOULDER REPLACEMENT Right 2021   TUBAL LIGATION     Patient Active Problem List   Diagnosis Date Noted   Allergic rhinitis due to pollen 08/23/2018   DDD (degenerative disc disease), lumbar 08/23/2018   Lumbar neuritis 08/23/2018   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   GERD (gastroesophageal reflux disease) 08/23/2018   Morbid obesity (HCC) 08/23/2018   Obstructive sleep apnea 08/23/2018   Intervertebral disc disorder with radiculopathy of lumbar region 08/23/2018   CML (chronic myelocytic leukemia) (HCC) 08/23/2018   Hx of myocardial  perfusion scan 08/23/2018   Osteoarthritis 08/23/2018   Degenerative arthritis of hip 08/23/2018   Lumbar spondylosis 08/23/2018   SOB (shortness of breath) 08/23/2018   History of CVA (cerebrovascular accident) 08/23/2018   Essential hypertension 08/23/2018   Chronic right shoulder pain 08/23/2018   Complete tear of right rotator cuff 08/23/2018   Acute pain of left shoulder 08/23/2018   On antineoplastic chemotherapy 08/23/2018   Hyperlipidemia 08/23/2018   Neurogenic claudication due to lumbar spinal stenosis 08/23/2018   Vitamin D deficiency 08/23/2018    PCP: [facility]   REFERRING PROVIDER: Tawana Scale, MD   REFERRING DIAG: [id number] (ICD-10-CM) - Spondylosis of lumbar region without myelopathy or radiculopathy  Low back pain, multifidus muscle training   Rationale for Evaluation and Treatment: Rehabilitation  THERAPY DIAG:  Muscle weakness (generalized)  Difficulty in walking, not elsewhere classified  Other low back pain  Cramp and spasm  ONSET DATE: years  SUBJECTIVE:  SUBJECTIVE STATEMENT: Patient states that she is feeling better today.  Pt states that she has been using her TENS unit and it seems to help som.  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 Yes: NPRS scale: 5/10 Pain location: low back Left side worse Pain description: sore and weak Aggravating factors: overhead reaching (putting dishes away) Relieving factors: no  PRECAUTIONS: None  RED FLAGS: None   WEIGHT BEARING RESTRICTIONS: No  FALLS:  Has patient fallen in last 6 months? No  LIVING ENVIRONMENT: Lives with: lives with their family and lives with their daughter Lives in:  House/apartment Stairs: 14 steps with rail on left Has following equipment at home: Dan Humphreys - 2 wheeled, Environmental consultant - 4 wheeled, and Wheelchair (manual)  OCCUPATION: retired  PLOF: Independent  PATIENT GOALS: get moving better, decrease soreness, hard to get undergarments on, getting around in bed  NEXT MD VISIT: after PT  OBJECTIVE:  Note: Objective measures were completed at Evaluation unless otherwise noted.  DIAGNOSTIC FINDINGS:  XR 2023: Findings/Impression:   Segmentation: There are five non-rib-bearing lumbar-type vertebral bodies.  Sclerotic degenerative changes along the L5 inferior and S1 superior  endplate.  Bones: Generalized osteopenia. No displaced fracture or loss of vertebral  body height.  Limited evaluation of the sacrum due to overlying bowel gas.  Alignment: Slight lumbar dextrocurvature. Unchanged slight grade 1  anterolisthesis of L3 on L4 and L4 on L5.  Degenerative changes: Interval progression of severe multilevel lumbar  spondylosis from 2021. Mild bilateral hip and bilateral SI joint  degenerative changes. Pubic symphysis appears maintained.   PATIENT SURVEYS:  Eval:  Modified Oswestry  27 / 50 = 54.0 %    COGNITION: Overall cognitive status: Within functional limits for tasks assessed     SENSATION: WFL  MUSCLE LENGTH: B HS WNL, R piriformis mild tightness  POSTURE: flexed trunk   PALPATION: Palpation: TTP at gluteals  LUMBAR ROM:   AROM eval  Flexion full  Extension Painful limited 80%  Right lateral flexion Limited and LOB   Left lateral flexion   Right rotation Limited 60%  Left rotation Limited 50%   (Blank rows = not tested)  LOWER EXTREMITY ROM:   WFL for tasks assessed   LOWER EXTREMITY MMT:    MMT Right eval Left eval  Hip flexion 4- 4  Hip extension    Hip abduction    Hip adduction    Hip internal rotation    Hip external rotation    Knee flexion 5 5  Knee extension 5 5  Ankle dorsiflexion 5 5  Ankle  plantarflexion    Ankle inversion    Ankle eversion     (Blank rows = not tested)   FUNCTIONAL TESTS:  Eval: 5 times sit to stand: 23.47 with B UE assist 3 minute walk test: 2.5 min 268 ft  02/10/2024: 3 minute walk test: 304 ft with RW (cuing for keeping walker closer to body to allow for improved upright posture) 5 times sit to stand: 21.16 sec with use of bilat UE   TREATMENT DATE:  02/10/2024: Nustep level 3 x5 minutes with PT present to discuss status 3 minute walk test: 304 ft with RW (cuing for keeping walker closer to body to allow for improved upright posture) Sit to/from stand 2x5 Seated with 2# ankle weight:  LAQ, heel/toe raises, and marching.  2x10 each bilat (cuing for technique and pacing)  Trigger Point Dry Needling Subsequent Treatment: Instructions provided previously at initial dry needling treatment.  Patient Verbal Consent Given: Yes Education Handout Provided: Previously Provided Muscles Treated: bilateral lumbar multifidi Electrical Stimulation Performed: No Treatment Response/Outcome: Utilized skilled palpation to identify bony landmarks and trigger points.  Able to illicit twitch response and muscle elongation.  Manual therapy:  Soft tissue mobilization with cocoa butter following to further promote tissue elongation.      02/03/24:  NuStep: level 5x 2.5 min, then L 3 x 4.5 min-PT present to discuss progress (needed one short break in between) Airex step ups x 10 with bilateral UE support (challenging) Step ups 6 inch step L x 10 Seated LAQ 2# AW x 10 bilateral  Seated marching 2# AW x 10 bilateral Standing heel raises 2# x 15 Seated heel/ toe raises 2# AW x 10 bilateral Gait: to Cancer gym and back - cues to not let walker get too far ahead  Self care: Educated patient on use of TENS unit and option to keep electrodes on when not in use and just connect to the unit as needed. (Pt alone during the day and can't reach behind to apply electrodes  independently)   01/31/24:  NuStep: level 4x 6 minutes-PT present to discuss progress Supine on heat: Trunk rotation x10 bil Knee to chest: with pillow case 2x20 seconds  Pelvic tilt x10 Trigger Point Dry Needling  Subsequent Treatment: Instructions provided previously at initial dry needling treatment.   Patient Verbal Consent Given: Yes Education Handout Provided: Previously Provided Muscles Treated: Bil lumbar multifidi and gluteals  Electrical Stimulation Performed: No Treatment Response/Outcome: twitch and improved tissue mobility  Manual: elongation and release to low back and gluteals after dry needling                                                                                                                         PATIENT EDUCATION:  Education details: PT eval findings, anticipated POC, and initial HEP , [email or web address] Person educated: Patient Education method: Explanation, Demonstration, and Handouts Education comprehension: verbalized understanding and returned demonstration  HOME EXERCISE PROGRAM: Access Code: [email or web address] URL: [email or web address] Date: 01/17/2024 Prepared by: Tresa Endo  Exercises - Supine Bridge  - 2 x daily - 7 x weekly - 1-3 sets - 10 reps - Sit to Stand with Counter Support  - 2 x daily - 3 x weekly - 1 sets - 5 reps - Seated Long Arc Quad  - 3 x daily - 7 x weekly - 2 sets - 10 reps - 5 hold - Seated March   - 3 x daily - 7 x weekly - 3 sets - 10 reps - Seated Heel Toe Raises   - 3 x daily - 7 x weekly - 2 sets - 10 reps - Seated Isometric Hip Adduction with Ball  - 3 x daily - 7 x weekly - 2 sets - 10 reps  ASSESSMENT:  CLINICAL IMPRESSION: Ms. Nolasco reports decreased pain today and  that overall, she is having a "good day."  Patient states that she tries to go out walking with her son when he comes to see her on the weekends, if able.  Patient able to complete a full 3 minute walk test today and with decreased time on 5 times sit  to stand.  Patient continues to require cuing during ambulation and seated exercises for improved technique.  Patient requests dry needling at end of session, as she has had great relief with this in the past.  Patient with slight twitch responses noted, but did report feeling better.  At end of session, performed manual therapy with cocoa butter to further promote tissue elongation and mobility.  Patient reported feeling better with decreased pain by completion of session.  OBJECTIVE IMPAIRMENTS: decreased activity tolerance, decreased balance, difficulty walking, decreased ROM, decreased strength, increased muscle spasms, impaired flexibility, postural dysfunction, obesity, and pain.   ACTIVITY LIMITATIONS: carrying, lifting, standing, stairs, bed mobility, dressing, reach over head, hygiene/grooming, and locomotion level  PARTICIPATION LIMITATIONS: meal prep, cleaning, laundry, community activity, and church  PERSONAL FACTORS: Age, Fitness, Past/current experiences, Time since onset of injury/illness/exacerbation, and 3+ comorbidities: CVA x 3, L shoulder OA, obesity  are also affecting patient's functional outcome.   REHAB POTENTIAL: Fair due to multiple comorbidities and frequency of visits  CLINICAL DECISION MAKING: Evolving/moderate complexity  EVALUATION COMPLEXITY: Moderate   GOALS: Goals reviewed with patient? Yes  SHORT TERM GOALS: Target date: 02/07/2024   Patient will be independent with initial HEP.  Baseline:  Goal status: MET  2.  Patient will demonstrate improved LE strength by improving 5XSTS by 2-3 seconds.  Baseline: 23.47 Goal status: Met on 02/10/24  3.  Able to complete with RW Baseline: 2.5 min 268 ft Goal status: Met on 02/10/2024   LONG TERM GOALS: Target date: 03/06/2024   Patient will be independent with advanced/ongoing HEP to improve outcomes and carryover.  Baseline:  Goal status: INITIAL  2.  Patient will report 50% improvement in low back  pain with ADLs to improve QOL.  Baseline:  Goal status: INITIAL  3.  Patient will demonstrate improved LE strength by being able climb stairs with 50% less shoulder pain.   Baseline: relies heavily on L UE Goal status: INITIAL  4.  Patient will report improved ease of donning her undergarments by 50% Baseline: must do in supine bridge position Goal status: INITIAL  5.  Patient will score < = 21 on the Modified Oswestry demonstrating improved functional ability.  Baseline: 27 / 50 Goal status: INITIAL  6.  Patient will demonstrate increased endurance with walking by completing a 5 MWT. Baseline: 2.5 min Goal status: INITIAL    PLAN:  PT FREQUENCY: 2x/week  PT DURATION: 8 weeks  PLANNED INTERVENTIONS: 97164- PT Re-evaluation, 97110-Therapeutic exercises, 97530- Therapeutic activity, [id number]- Neuromuscular re-education, 97535- Self Care, 16109- Manual therapy, [id number]- Gait training, [phone]- Electrical stimulation (unattended), [phone]- Traction (mechanical), Patient/Family education, Stair training, Taping, Dry Needling, Joint mobilization, Spinal mobilization, DME instructions, Cryotherapy, and Moist heat.  PLAN FOR NEXT SESSION: DN next visit ; continue functional LE strength (STS, step ups),walking endurance, progress HEP as able     Reather Laurence, PT, DPT 02/10/24, 11:20 AM  [facility] [address], [address] [place], [place] [address] Phone # [phone] Fax [phone]

## 2024-02-14 ENCOUNTER — Ambulatory Visit: Payer: Medicare HMO

## 2024-02-14 DIAGNOSIS — R252 Cramp and spasm: Secondary | ICD-10-CM

## 2024-02-14 DIAGNOSIS — M6281 Muscle weakness (generalized): Secondary | ICD-10-CM

## 2024-02-14 DIAGNOSIS — R262 Difficulty in walking, not elsewhere classified: Secondary | ICD-10-CM

## 2024-02-14 DIAGNOSIS — M5459 Other low back pain: Secondary | ICD-10-CM

## 2024-02-14 NOTE — Therapy (Signed)
 OUTPATIENT PHYSICAL THERAPY THORACOLUMBAR TREATMENT   Patient Name: Pamela Cox MRN: [id number] DOB:1941-09-06, 83 y.o., female Today's Date: 02/14/2024  END OF SESSION:  PT End of Session - 02/14/24 1233     Visit Number 7    Date for PT Re-Evaluation 03/06/24    Authorization Type [employer] MCR    Progress Note Due on Visit 10    PT Start Time 1148    PT Stop Time 1231    PT Time Calculation (min) 43 min    Activity Tolerance Patient tolerated treatment well    Behavior During Therapy WFL for tasks assessed/performed                  Past Medical History:  Diagnosis Date   Arthritis    CML (chronic myelocytic leukemia) (HCC)    CVA (cerebral vascular accident) (HCC)    Diabetes mellitus without complication (HCC)    GERD (gastroesophageal reflux disease)    Hypertension    Myelocytic leukemia, chronic (HCC)    Obesity    Sleep apnea    Past Surgical History:  Procedure Laterality Date   APPENDECTOMY     COLONOSCOPY     COLONOSCOPY WITH PROPOFOL N/A 07/16/2019   Procedure: COLONOSCOPY WITH PROPOFOL;  Surgeon: Christena Deem, MD;  Location: [facility] ENDOSCOPY;  Service: Endoscopy;  Laterality: N/A;   ESOPHAGOGASTRODUODENOSCOPY (EGD) WITH PROPOFOL N/A 07/16/2019   Procedure: ESOPHAGOGASTRODUODENOSCOPY (EGD) WITH PROPOFOL;  Surgeon: Christena Deem, MD;  Location: [facility] ENDOSCOPY;  Service: Endoscopy;  Laterality: N/A;   TOTAL SHOULDER REPLACEMENT Right 2021   TUBAL LIGATION     Patient Active Problem List   Diagnosis Date Noted   Allergic rhinitis due to pollen 08/23/2018   DDD (degenerative disc disease), lumbar 08/23/2018   Lumbar neuritis 08/23/2018   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   GERD (gastroesophageal reflux disease) 08/23/2018   Morbid obesity (HCC) 08/23/2018   Obstructive sleep apnea 08/23/2018   Intervertebral disc disorder with radiculopathy of lumbar region 08/23/2018   CML (chronic myelocytic leukemia) (HCC) 08/23/2018   Hx of myocardial  perfusion scan 08/23/2018   Osteoarthritis 08/23/2018   Degenerative arthritis of hip 08/23/2018   Lumbar spondylosis 08/23/2018   SOB (shortness of breath) 08/23/2018   History of CVA (cerebrovascular accident) 08/23/2018   Essential hypertension 08/23/2018   Chronic right shoulder pain 08/23/2018   Complete tear of right rotator cuff 08/23/2018   Acute pain of left shoulder 08/23/2018   On antineoplastic chemotherapy 08/23/2018   Hyperlipidemia 08/23/2018   Neurogenic claudication due to lumbar spinal stenosis 08/23/2018   Vitamin D deficiency 08/23/2018    PCP: [facility]   REFERRING PROVIDER: Tawana Scale, MD   REFERRING DIAG: [phone] (ICD-10-CM) - Spondylosis of lumbar region without myelopathy or radiculopathy  Low back pain, multifidus muscle training   Rationale for Evaluation and Treatment: Rehabilitation  THERAPY DIAG:  Muscle weakness (generalized)  Difficulty in walking, not elsewhere classified  Other low back pain  Cramp and spasm  ONSET DATE: years  SUBJECTIVE:  SUBJECTIVE STATEMENT: I feel good today.  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 Yes: NPRS scale: 5/10 Pain location: low back Left side worse Pain description: sore and weak Aggravating factors: overhead reaching (putting dishes away) Relieving factors: no  PRECAUTIONS: None  RED FLAGS: None   WEIGHT BEARING RESTRICTIONS: No  FALLS:  Has patient fallen in last 6 months? No  LIVING ENVIRONMENT: Lives with: lives with their family and lives with their daughter Lives in: House/apartment Stairs: 14 steps with rail on left Has following equipment at home: Dan Humphreys - 2 wheeled, Environmental consultant - 4 wheeled,  and Wheelchair (manual)  OCCUPATION: retired  PLOF: Independent  PATIENT GOALS: get moving better, decrease soreness, hard to get undergarments on, getting around in bed  NEXT MD VISIT: after PT  OBJECTIVE:  Note: Objective measures were completed at Evaluation unless otherwise noted.  DIAGNOSTIC FINDINGS:  XR 2023: Findings/Impression:   Segmentation: There are five non-rib-bearing lumbar-type vertebral bodies.  Sclerotic degenerative changes along the L5 inferior and S1 superior  endplate.  Bones: Generalized osteopenia. No displaced fracture or loss of vertebral  body height.  Limited evaluation of the sacrum due to overlying bowel gas.  Alignment: Slight lumbar dextrocurvature. Unchanged slight grade 1  anterolisthesis of L3 on L4 and L4 on L5.  Degenerative changes: Interval progression of severe multilevel lumbar  spondylosis from 2021. Mild bilateral hip and bilateral SI joint  degenerative changes. Pubic symphysis appears maintained.   PATIENT SURVEYS:  Eval:  Modified Oswestry  27 / 50 = 54.0 %    COGNITION: Overall cognitive status: Within functional limits for tasks assessed     SENSATION: WFL  MUSCLE LENGTH: B HS WNL, R piriformis mild tightness  POSTURE: flexed trunk   PALPATION: Palpation: TTP at gluteals  LUMBAR ROM:   AROM eval  Flexion full  Extension Painful limited 80%  Right lateral flexion Limited and LOB   Left lateral flexion   Right rotation Limited 60%  Left rotation Limited 50%   (Blank rows = not tested)  LOWER EXTREMITY ROM:   WFL for tasks assessed   LOWER EXTREMITY MMT:    MMT Right eval Left eval  Hip flexion 4- 4  Hip extension    Hip abduction    Hip adduction    Hip internal rotation    Hip external rotation    Knee flexion 5 5  Knee extension 5 5  Ankle dorsiflexion 5 5  Ankle plantarflexion    Ankle inversion    Ankle eversion     (Blank rows = not tested)   FUNCTIONAL TESTS:  Eval: 5 times sit to  stand: 23.47 with B UE assist 3 minute walk test: 2.5 min 268 ft  02/10/2024: 3 minute walk test: 304 ft with RW (cuing for keeping walker closer to body to allow for improved upright posture) 5 times sit to stand: 21.16 sec with use of bilat UE   TREATMENT DATE:   02/14/2024: Nustep level 3 x8 minutes with PT present to discuss status Cross punches: 2# Rt and 0# Lt x20 each Seated rows with red band: 2x20 Seated hamstring curls with yellow loop 2x10  Seated hamstring curls 2x10 Discussed aquatic PT- issued info handout and pt will discuss with her daughter. Sit to/from stand 2x5 Seated with 2# ankle weight:  LAQ, heel/toe raises, and marching.  2x10 each bilat (cuing for technique and pacing) 02/10/2024:  Nustep level 3 x5 minutes with PT present to discuss status 3 minute walk test: 304 ft with RW (cuing for keeping walker closer to body to allow for improved upright posture) Sit to/from stand 2x5 Seated with 2# ankle weight:  LAQ, heel/toe raises, and marching.  2x10 each bilat (cuing for technique and pacing) Trigger Point Dry Needling Subsequent Treatment: Instructions provided previously at initial dry needling treatment.  Patient Verbal Consent Given: Yes Education Handout Provided: Previously Provided Muscles Treated: bilateral lumbar multifidi Electrical Stimulation Performed: No Treatment Response/Outcome: Utilized skilled palpation to identify bony landmarks and trigger points.  Able to illicit twitch response and muscle elongation.  Manual therapy:  Soft tissue mobilization with cocoa butter following to further promote tissue elongation.      02/03/24:  NuStep: level 5x 2.5 min, then L 3 x 4.5 min-PT present to discuss progress (needed one short break in between) Airex step ups x 10 with bilateral UE support (challenging) Step ups 6 inch step L x 10 Seated LAQ 2# AW x 10 bilateral  Seated marching 2# AW x 10 bilateral Standing heel raises 2# x 15 Seated heel/ toe  raises 2# AW x 10 bilateral Gait: to Cancer gym and back - cues to not let walker get too far ahead  Self care: Educated patient on use of TENS unit and option to keep electrodes on when not in use and just connect to the unit as needed. (Pt alone during the day and can't reach behind to apply electrodes independently)    PATIENT EDUCATION:  Education details: PT eval findings, anticipated POC, and initial HEP , [email or web address] Person educated: Patient Education method: Explanation, Demonstration, and Handouts Education comprehension: verbalized understanding and returned demonstration  HOME EXERCISE PROGRAM: Access Code: [email or web address] URL: [email or web address] Date: 01/17/2024 Prepared by: Tresa Endo  Exercises - Supine Bridge  - 2 x daily - 7 x weekly - 1-3 sets - 10 reps - Sit to Stand with Counter Support  - 2 x daily - 3 x weekly - 1 sets - 5 reps - Seated Long Arc Quad  - 3 x daily - 7 x weekly - 2 sets - 10 reps - 5 hold - Seated March   - 3 x daily - 7 x weekly - 3 sets - 10 reps - Seated Heel Toe Raises   - 3 x daily - 7 x weekly - 2 sets - 10 reps - Seated Isometric Hip Adduction with Ball  - 3 x daily - 7 x weekly - 2 sets - 10 reps  ASSESSMENT:  CLINICAL IMPRESSION: Pt reports that today is a good day with reduced LBP. Objective measures for mobility improved last week. She did well with all exercises in the clinic today and PT monitored throughout for safety, pain and technique.  Pt reports 40% overall improvement in symptoms since the start of care. PT discussed aquatics with pt and issued handout.  She will discuss with her daughter.   Patient will benefit from skilled PT to address the below impairments and improve overall function.   OBJECTIVE IMPAIRMENTS: decreased activity tolerance, decreased balance, difficulty walking, decreased ROM, decreased strength, increased muscle spasms, impaired flexibility, postural dysfunction, obesity, and pain.   ACTIVITY LIMITATIONS:  carrying, lifting, standing, stairs, bed mobility, dressing, reach over head, hygiene/grooming, and locomotion level  PARTICIPATION LIMITATIONS: meal prep, cleaning, laundry, community activity, and church  PERSONAL FACTORS: Age, Fitness, Past/current experiences, Time since onset of injury/illness/exacerbation, and 3+ comorbidities: CVA x 3, L shoulder OA, obesity  are also affecting patient's functional outcome.   REHAB POTENTIAL: Fair due to multiple comorbidities and frequency of visits  CLINICAL DECISION MAKING: Evolving/moderate complexity  EVALUATION COMPLEXITY: Moderate   GOALS: Goals reviewed with patient? Yes  SHORT TERM GOALS: Target date: 02/07/2024   Patient will be independent with initial HEP.  Baseline:  Goal status: MET  2.  Patient will demonstrate improved LE strength by improving 5XSTS by 2-3 seconds.  Baseline: 23.47 Goal status: Met on 02/10/24  3.  Able to complete with RW Baseline: 304 ft with RW Goal status: Met on 02/10/2024   LONG TERM GOALS: Target date: 03/06/2024   Patient will be independent with advanced/ongoing HEP to improve outcomes and carryover.  Baseline:  Goal status: INITIAL  2.  Patient will report 50% improvement in low back pain with ADLs to improve QOL.  Baseline: 40% (02/14/24) Goal status: in progress   3.  Patient will demonstrate improved LE strength by being able climb stairs with 50% less shoulder pain.   Baseline: relies heavily on L UE Goal status: INITIAL  4.  Patient will report improved ease of donning her undergarments by 50% Baseline: must do in supine bridge position Goal status: INITIAL  5.  Patient will score < = 21 on the Modified Oswestry demonstrating improved functional ability.  Baseline: 27 / 50 Goal status: INITIAL  6.  Patient will demonstrate increased endurance with walking by completing a 5 MWT. Baseline: 2.5 min Goal status: INITIAL    PLAN:  PT FREQUENCY: 2x/week  PT DURATION: 8  weeks  PLANNED INTERVENTIONS: 97164- PT Re-evaluation, 97110-Therapeutic exercises, 97530- Therapeutic activity, [id number]- Neuromuscular re-education, 97535- Self Care, 10960- Manual therapy, [id number]- Gait training, [phone]- Electrical stimulation (unattended), [phone]- Traction (mechanical), Patient/Family education, Stair training, Taping, Dry Needling, Joint mobilization, Spinal mobilization, DME instructions, Cryotherapy, and Moist heat.  PLAN FOR NEXT SESSION: dry needling if needed ; continue functional LE strength ,walking endurance, progress HEP as able. Pt will discuss aquatics with her daughter and might switch one of her land treatments to aquatics if they decide to do this.  Will need to send new cert to include aquatics if she proceeds to with this.     Lorrene Reid, PT 02/14/24 12:35 PM   [facility] [address], [address] [place], [place] [address] Phone # [phone] Fax [phone]

## 2024-02-21 ENCOUNTER — Ambulatory Visit: Payer: Medicare HMO

## 2024-02-28 ENCOUNTER — Encounter: Payer: Self-pay | Admitting: Physical Therapy

## 2024-02-28 ENCOUNTER — Ambulatory Visit: Payer: Medicare HMO | Attending: Anesthesiology | Admitting: Physical Therapy

## 2024-02-28 DIAGNOSIS — R252 Cramp and spasm: Secondary | ICD-10-CM | POA: Insufficient documentation

## 2024-02-28 DIAGNOSIS — M5459 Other low back pain: Secondary | ICD-10-CM | POA: Diagnosis present

## 2024-02-28 DIAGNOSIS — R262 Difficulty in walking, not elsewhere classified: Secondary | ICD-10-CM | POA: Diagnosis present

## 2024-02-28 DIAGNOSIS — M6281 Muscle weakness (generalized): Secondary | ICD-10-CM | POA: Insufficient documentation

## 2024-02-28 NOTE — Therapy (Signed)
 OUTPATIENT PHYSICAL THERAPY THORACOLUMBAR TREATMENT   Patient Name: Pamela Cox MRN: [id number] DOB:Jul 27, 1941, 83 y.o., female Today's Date: 02/28/2024  END OF SESSION:  PT End of Session - 02/28/24 1351     Visit Number 8    Date for PT Re-Evaluation 03/06/24    Authorization Type [employer] MCR    Progress Note Due on Visit 10    PT Start Time 1150    PT Stop Time 1230    PT Time Calculation (min) 40 min    Activity Tolerance Patient tolerated treatment well    Behavior During Therapy WFL for tasks assessed/performed                   Past Medical History:  Diagnosis Date   Arthritis    CML (chronic myelocytic leukemia) (HCC)    CVA (cerebral vascular accident) (HCC)    Diabetes mellitus without complication (HCC)    GERD (gastroesophageal reflux disease)    Hypertension    Myelocytic leukemia, chronic (HCC)    Obesity    Sleep apnea    Past Surgical History:  Procedure Laterality Date   APPENDECTOMY     COLONOSCOPY     COLONOSCOPY WITH PROPOFOL N/A 07/16/2019   Procedure: COLONOSCOPY WITH PROPOFOL;  Surgeon: Christena Deem, MD;  Location: [facility] ENDOSCOPY;  Service: Endoscopy;  Laterality: N/A;   ESOPHAGOGASTRODUODENOSCOPY (EGD) WITH PROPOFOL N/A 07/16/2019   Procedure: ESOPHAGOGASTRODUODENOSCOPY (EGD) WITH PROPOFOL;  Surgeon: Christena Deem, MD;  Location: [facility] ENDOSCOPY;  Service: Endoscopy;  Laterality: N/A;   TOTAL SHOULDER REPLACEMENT Right 2021   TUBAL LIGATION     Patient Active Problem List   Diagnosis Date Noted   Allergic rhinitis due to pollen 08/23/2018   DDD (degenerative disc disease), lumbar 08/23/2018   Lumbar neuritis 08/23/2018   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   GERD (gastroesophageal reflux disease) 08/23/2018   Morbid obesity (HCC) 08/23/2018   Obstructive sleep apnea 08/23/2018   Intervertebral disc disorder with radiculopathy of lumbar region 08/23/2018   CML (chronic myelocytic leukemia) (HCC) 08/23/2018   Hx of myocardial  perfusion scan 08/23/2018   Osteoarthritis 08/23/2018   Degenerative arthritis of hip 08/23/2018   Lumbar spondylosis 08/23/2018   SOB (shortness of breath) 08/23/2018   History of CVA (cerebrovascular accident) 08/23/2018   Essential hypertension 08/23/2018   Chronic right shoulder pain 08/23/2018   Complete tear of right rotator cuff 08/23/2018   Acute pain of left shoulder 08/23/2018   On antineoplastic chemotherapy 08/23/2018   Hyperlipidemia 08/23/2018   Neurogenic claudication due to lumbar spinal stenosis 08/23/2018   Vitamin D deficiency 08/23/2018    PCP: [facility]   REFERRING PROVIDER: Tawana Scale, MD   REFERRING DIAG: [phone] (ICD-10-CM) - Spondylosis of lumbar region without myelopathy or radiculopathy  Low back pain, multifidus muscle training   Rationale for Evaluation and Treatment: Rehabilitation  THERAPY DIAG:  Muscle weakness (generalized)  Difficulty in walking, not elsewhere classified  Other low back pain  Cramp and spasm  ONSET DATE: years  SUBJECTIVE:  SUBJECTIVE STATEMENT: I feel very sore today. I think dry needling will help me.  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 Yes: NPRS scale: 5/10 Pain location: low back Left side worse Pain description: sore and weak Aggravating factors: overhead reaching (putting dishes away) Relieving factors: no  PRECAUTIONS: None  RED FLAGS: None   WEIGHT BEARING RESTRICTIONS: No  FALLS:  Has patient fallen in last 6 months? No  LIVING ENVIRONMENT: Lives with: lives with their family and lives with their daughter Lives in: House/apartment Stairs: 14 steps with rail on left Has following equipment at home:  Dan Humphreys - 2 wheeled, Environmental consultant - 4 wheeled, and Wheelchair (manual)  OCCUPATION: retired  PLOF: Independent  PATIENT GOALS: get moving better, decrease soreness, hard to get undergarments on, getting around in bed  NEXT MD VISIT: after PT  OBJECTIVE:  Note: Objective measures were completed at Evaluation unless otherwise noted.  DIAGNOSTIC FINDINGS:  XR 2023: Findings/Impression:   Segmentation: There are five non-rib-bearing lumbar-type vertebral bodies.  Sclerotic degenerative changes along the L5 inferior and S1 superior  endplate.  Bones: Generalized osteopenia. No displaced fracture or loss of vertebral  body height.  Limited evaluation of the sacrum due to overlying bowel gas.  Alignment: Slight lumbar dextrocurvature. Unchanged slight grade 1  anterolisthesis of L3 on L4 and L4 on L5.  Degenerative changes: Interval progression of severe multilevel lumbar  spondylosis from 2021. Mild bilateral hip and bilateral SI joint  degenerative changes. Pubic symphysis appears maintained.   PATIENT SURVEYS:  Eval:  Modified Oswestry  27 / 50 = 54.0 %    COGNITION: Overall cognitive status: Within functional limits for tasks assessed     SENSATION: WFL  MUSCLE LENGTH: B HS WNL, R piriformis mild tightness  POSTURE: flexed trunk   PALPATION: Palpation: TTP at gluteals  LUMBAR ROM:   AROM eval  Flexion full  Extension Painful limited 80%  Right lateral flexion Limited and LOB   Left lateral flexion   Right rotation Limited 60%  Left rotation Limited 50%   (Blank rows = not tested)  LOWER EXTREMITY ROM:   WFL for tasks assessed   LOWER EXTREMITY MMT:    MMT Right eval Left eval  Hip flexion 4- 4  Hip extension    Hip abduction    Hip adduction    Hip internal rotation    Hip external rotation    Knee flexion 5 5  Knee extension 5 5  Ankle dorsiflexion 5 5  Ankle plantarflexion    Ankle inversion    Ankle eversion     (Blank rows = not  tested)   FUNCTIONAL TESTS:  Eval: 5 times sit to stand: 23.47 with B UE assist 3 minute walk test: 2.5 min 268 ft  02/10/2024: 3 minute walk test: 304 ft with RW (cuing for keeping walker closer to body to allow for improved upright posture) 5 times sit to stand: 21.16 sec with use of bilat UE  02/28/2024 : 375ft with RW (one minute seated rest beak after 1 minute)   TREATMENT DATE:  02/28/2024: : 361ft with RW (one minute seated rest beak after 1 minute) Nustep level 3 x6 minutes with PT present to discuss status Seated hamstring curls with yellow loop2x10 Seated shoulder row with red TB x 10  Trigger Point Dry Needling  Subsequent Treatment: Instructions provided previously at initial dry needling treatment.  Patient Verbal Consent Given: Yes Education Handout Provided: Previously Provided Muscles Treated: Lt lumbar multifidi Electrical Stimulation Performed: No Treatment Response/Outcome: Utilized skilled palpation to identify bony landmarks and trigger points.  Able to illicit twitch response and muscle elongation.  Manual therapy:  Soft tissue mobilization with cocoa butter following to further promote tissue elongation.    02/14/2024: Nustep level 3 x8 minutes with PT present to discuss status Cross punches: 2# Rt and 0# Lt x20 each Seated rows with red band: 2x20 Seated hamstring curls with yellow loop 2x10  Seated hamstring curls 2x10 Discussed aquatic PT- issued info handout and pt will discuss with her daughter. Sit to/from stand 2x5 Seated with 2# ankle weight:  LAQ, heel/toe raises, and marching.  2x10 each bilat (cuing for technique and pacing)  02/10/2024: Nustep level 3 x5 minutes with PT present to discuss status 3 minute walk test: 304 ft with RW (cuing for keeping walker closer to body to allow for improved upright posture) Sit to/from stand 2x5 Seated with 2# ankle weight:  LAQ, heel/toe raises, and marching.  2x10 each bilat (cuing for technique  and pacing) Trigger Point Dry Needling Subsequent Treatment: Instructions provided previously at initial dry needling treatment.  Patient Verbal Consent Given: Yes Education Handout Provided: Previously Provided Muscles Treated: bilateral lumbar multifidi Electrical Stimulation Performed: No Treatment Response/Outcome: Utilized skilled palpation to identify bony landmarks and trigger points.  Able to illicit twitch response and muscle elongation.  Manual therapy:  Soft tissue mobilization with cocoa butter following to further promote tissue elongation.      PATIENT EDUCATION:  Education details: PT eval findings, anticipated POC, and initial HEP , [email or web address] Person educated: Patient Education method: Explanation, Demonstration, and Handouts Education comprehension: verbalized understanding and returned demonstration  HOME EXERCISE PROGRAM: Access Code: [email or web address] URL: [email or web address] Date: 01/17/2024 Prepared by: Tresa Endo  Exercises - Supine Bridge  - 2 x daily - 7 x weekly - 1-3 sets - 10 reps - Sit to Stand with Counter Support  - 2 x daily - 3 x weekly - 1 sets - 5 reps - Seated Long Arc Quad  - 3 x daily - 7 x weekly - 2 sets - 10 reps - 5 hold - Seated March   - 3 x daily - 7 x weekly - 3 sets - 10 reps - Seated Heel Toe Raises   - 3 x daily - 7 x weekly - 2 sets - 10 reps - Seated Isometric Hip Adduction with Ball  - 3 x daily - 7 x weekly - 2 sets - 10 reps  ASSESSMENT:  CLINICAL IMPRESSION: Pt reports she is very sore today. Re-administered 3 MWT and patient ambulated 4 ft more than last time. Patient required a 1 minute seated rest break after ambulating for one minute. Performed dry needling to address patient muscle spasms in Lt lumbar paraspinals. Patient verbalized relief after technique. With patient requiring transportation from her daughters to physical therapy she is not sure if she would be able to do aquatic physical therapy.     OBJECTIVE  IMPAIRMENTS: decreased activity tolerance, decreased balance, difficulty walking, decreased ROM, decreased strength, increased muscle spasms, impaired flexibility, postural dysfunction, obesity, and pain.   ACTIVITY LIMITATIONS: carrying, lifting, standing, stairs, bed mobility, dressing, reach over head, hygiene/grooming, and locomotion level  PARTICIPATION LIMITATIONS: meal prep, cleaning, laundry, community activity, and church  PERSONAL FACTORS: Age, Fitness, Past/current experiences, Time since onset of injury/illness/exacerbation, and 3+ comorbidities: CVA x 3, L shoulder OA, obesity  are also affecting patient's functional outcome.   REHAB POTENTIAL: Fair due to multiple comorbidities and frequency of visits  CLINICAL DECISION MAKING: Evolving/moderate complexity  EVALUATION COMPLEXITY: Moderate   GOALS: Goals reviewed with patient? Yes  SHORT TERM GOALS: Target date: 02/07/2024   Patient will be independent with initial HEP.  Baseline:  Goal status: MET  2.  Patient will demonstrate improved LE strength by improving 5XSTS by 2-3 seconds.  Baseline: 23.47 Goal status: Met on 02/10/24  3.  Able to complete with RW Baseline: 304 ft with RW Goal status: Met on 02/10/2024   LONG TERM GOALS: Target date: 03/06/2024   Patient will be independent with advanced/ongoing HEP to improve outcomes and carryover.  Baseline:  Goal status: INITIAL  2.  Patient will report 50% improvement in low back pain with ADLs to improve QOL.  Baseline: 40% (02/14/24) Goal status: in progress   3.  Patient will demonstrate improved LE strength by being able climb stairs with 50% less shoulder pain.   Baseline: relies heavily on L UE Goal status: INITIAL  4.  Patient will report improved ease of donning her undergarments by 50% Baseline: must do in supine bridge position Goal status: INITIAL  5.  Patient will score < = 21 on the Modified Oswestry demonstrating improved functional ability.   Baseline: 27 / 50 Goal status: INITIAL  6.  Patient will demonstrate increased endurance with walking by completing a 5 MWT. Baseline: 2.5 min Goal status: INITIAL    PLAN:  PT FREQUENCY: 2x/week  PT DURATION: 8 weeks  PLANNED INTERVENTIONS: 97164- PT Re-evaluation, 97110-Therapeutic exercises, 97530- Therapeutic activity, [id number]- Neuromuscular re-education, 97535- Self Care, [id number]- Manual therapy, [id number]- Gait training, [phone]- Electrical stimulation (unattended), [phone]- Traction (mechanical), Patient/Family education, Stair training, Taping, Dry Needling, Joint mobilization, Spinal mobilization, DME instructions, Cryotherapy, and Moist heat.  PLAN FOR NEXT SESSION: cert ends 4/69; continue functional LE strength ,walking endurance, progress HEP as able.    Claude Manges, PT 02/28/24 1:52 PM [facility] [address], [address] [place], [place] [address] Phone # [phone] Fax [phone]

## 2024-03-06 ENCOUNTER — Ambulatory Visit: Payer: Medicare HMO | Admitting: Physical Therapy

## 2024-03-06 ENCOUNTER — Encounter: Payer: Self-pay | Admitting: Physical Therapy

## 2024-03-06 DIAGNOSIS — R252 Cramp and spasm: Secondary | ICD-10-CM

## 2024-03-06 DIAGNOSIS — M6281 Muscle weakness (generalized): Secondary | ICD-10-CM | POA: Diagnosis not present

## 2024-03-06 DIAGNOSIS — R262 Difficulty in walking, not elsewhere classified: Secondary | ICD-10-CM

## 2024-03-06 DIAGNOSIS — M5459 Other low back pain: Secondary | ICD-10-CM

## 2024-03-06 NOTE — Therapy (Signed)
 OUTPATIENT PHYSICAL THERAPY THORACOLUMBAR TREATMENT / RE CERTIFICATION/ 10th visit PN  Progress Note Reporting Period 01/10/2024 to 03/06/2024  See note below for Objective Data and Assessment of Progress/Goals.     Patient Name: Pamela Cox MRN: [id number] DOB:[birth year], 83 y.o., female Today's Date: 03/06/2024  END OF SESSION:  PT End of Session - 03/06/24 1321     Visit Number 9    Date for PT Re-Evaluation 04/20/24    Authorization Type [employer] MCR    Progress Note Due on Visit 19    PT Start Time 1147    PT Stop Time 1231    PT Time Calculation (min) 44 min    Activity Tolerance Patient tolerated treatment well    Behavior During Therapy WFL for tasks assessed/performed                    Past Medical History:  Diagnosis Date   Arthritis    CML (chronic myelocytic leukemia) (HCC)    CVA (cerebral vascular accident) (HCC)    Diabetes mellitus without complication (HCC)    GERD (gastroesophageal reflux disease)    Hypertension    Myelocytic leukemia, chronic (HCC)    Obesity    Sleep apnea    Past Surgical History:  Procedure Laterality Date   APPENDECTOMY     COLONOSCOPY     COLONOSCOPY WITH PROPOFOL N/A 07/16/2019   Procedure: COLONOSCOPY WITH PROPOFOL;  Surgeon: Deveron Fly, MD;  Location: [facility] ENDOSCOPY;  Service: Endoscopy;  Laterality: N/A;   ESOPHAGOGASTRODUODENOSCOPY (EGD) WITH PROPOFOL N/A 07/16/2019   Procedure: ESOPHAGOGASTRODUODENOSCOPY (EGD) WITH PROPOFOL;  Surgeon: Deveron Fly, MD;  Location: [facility] ENDOSCOPY;  Service: Endoscopy;  Laterality: N/A;   TOTAL SHOULDER REPLACEMENT Right 2021   TUBAL LIGATION     Patient Active Problem List   Diagnosis Date Noted   Allergic rhinitis due to pollen 08/23/2018   DDD (degenerative disc disease), lumbar 08/23/2018   Lumbar neuritis 08/23/2018   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   GERD (gastroesophageal reflux disease) 08/23/2018   Morbid obesity (HCC) 08/23/2018   Obstructive sleep  apnea 08/23/2018   Intervertebral disc disorder with radiculopathy of lumbar region 08/23/2018   CML (chronic myelocytic leukemia) (HCC) 08/23/2018   Hx of myocardial perfusion scan 08/23/2018   Osteoarthritis 08/23/2018   Degenerative arthritis of hip 08/23/2018   Lumbar spondylosis 08/23/2018   SOB (shortness of breath) 08/23/2018   History of CVA (cerebrovascular accident) 08/23/2018   Essential hypertension 08/23/2018   Chronic right shoulder pain 08/23/2018   Complete tear of right rotator cuff 08/23/2018   Acute pain of left shoulder 08/23/2018   On antineoplastic chemotherapy 08/23/2018   Hyperlipidemia 08/23/2018   Neurogenic claudication due to lumbar spinal stenosis 08/23/2018   Vitamin D deficiency 08/23/2018    PCP: [facility]   REFERRING PROVIDER: Retta Caster, MD   REFERRING DIAG: [phone] (ICD-10-CM) - Spondylosis of lumbar region without myelopathy or radiculopathy  Low back pain, multifidus muscle training   Rationale for Evaluation and Treatment: Rehabilitation  THERAPY DIAG:  Muscle weakness (generalized)  Difficulty in walking, not elsewhere classified  Other low back pain  Cramp and spasm  ONSET DATE: years  SUBJECTIVE:  SUBJECTIVE STATEMENT: Patient reports she is feeling good today she just has some back soreness.  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 Yes: NPRS scale: 5/10 Pain location: low back Left side worse Pain description: sore and weak Aggravating factors: overhead reaching (putting dishes away) Relieving factors: no  PRECAUTIONS: None  RED FLAGS: None   WEIGHT BEARING RESTRICTIONS: No  FALLS:  Has patient fallen in last 6 months?  No  LIVING ENVIRONMENT: Lives with: lives with their family and lives with their daughter Lives in: House/apartment Stairs: 14 steps with rail on left Has following equipment at home: Dan Humphreys - 2 wheeled, Environmental consultant - 4 wheeled, and Wheelchair (manual)  OCCUPATION: retired  PLOF: Independent  PATIENT GOALS: get moving better, decrease soreness, hard to get undergarments on, getting around in bed  NEXT MD VISIT: after PT  OBJECTIVE:  Note: Objective measures were completed at Evaluation unless otherwise noted.  DIAGNOSTIC FINDINGS:  XR 2023: Findings/Impression:   Segmentation: There are five non-rib-bearing lumbar-type vertebral bodies.  Sclerotic degenerative changes along the L5 inferior and S1 superior  endplate.  Bones: Generalized osteopenia. No displaced fracture or loss of vertebral  body height.  Limited evaluation of the sacrum due to overlying bowel gas.  Alignment: Slight lumbar dextrocurvature. Unchanged slight grade 1  anterolisthesis of L3 on L4 and L4 on L5.  Degenerative changes: Interval progression of severe multilevel lumbar  spondylosis from 2021. Mild bilateral hip and bilateral SI joint  degenerative changes. Pubic symphysis appears maintained.   PATIENT SURVEYS:  Eval:  Modified Oswestry  27 / 50 = 54.0 %  03/06/2024 : ODI- 20/50 40%  COGNITION: Overall cognitive status: Within functional limits for tasks assessed     SENSATION: WFL  MUSCLE LENGTH: B HS WNL, R piriformis mild tightness  POSTURE: flexed trunk   PALPATION: Palpation: TTP at gluteals  LUMBAR ROM:   AROM eval  Flexion full  Extension Painful limited 80%  Right lateral flexion Limited and LOB   Left lateral flexion   Right rotation Limited 60%  Left rotation Limited 50%   (Blank rows = not tested)  LOWER EXTREMITY ROM:   WFL for tasks assessed   LOWER EXTREMITY MMT:    MMT Right eval Left eval  Hip flexion 4- 4  Hip extension    Hip abduction    Hip adduction     Hip internal rotation    Hip external rotation    Knee flexion 5 5  Knee extension 5 5  Ankle dorsiflexion 5 5  Ankle plantarflexion    Ankle inversion    Ankle eversion     (Blank rows = not tested)   FUNCTIONAL TESTS:  Eval: 5 times sit to stand: 23.47 with B UE assist 3 minute walk test: 2.5 min 268 ft  02/10/2024: 3 minute walk test: 304 ft with RW (cuing for keeping walker closer to body to allow for improved upright posture) 5 times sit to stand: 21.16 sec with use of bilat UE  02/28/2024 : 33ft with RW (one minute seated rest beak after 1 minute)  03/06/2024 5 STS:20.15 sec with UE support   TREATMENT DATE:  03/06/2024: Nustep level 4 x6 minutes with PT present to discuss status 5STS:20.15 sec with UE support 6 inch step taps with unilateral UE support x 20 Standing hip abduction & marching at barre x 10 each leg Standing heel raise at barre  x 10 LAQ 2.5# 2 x 10 bilateral  ODI: 20/50 40%  02/28/2024: : 363ft with RW (one minute seated rest beak after 1 minute) Nustep level 3 x6 minutes with PT present to discuss status Seated hamstring curls with yellow loop2x10 Seated shoulder row with red TB x 10  Trigger Point Dry Needling  Subsequent Treatment: Instructions provided previously at initial dry needling treatment.   Patient Verbal Consent Given: Yes Education Handout Provided: Previously Provided Muscles Treated: Lt lumbar multifidi Electrical Stimulation Performed: No Treatment Response/Outcome: Utilized skilled palpation to identify bony landmarks and trigger points.  Able to illicit twitch response and muscle elongation.  Manual therapy:  Soft tissue mobilization with cocoa butter following to further promote tissue elongation.    02/14/2024: Nustep level 3 x8 minutes with PT present to discuss status Cross punches: 2# Rt and 0# Lt x20 each Seated rows with red band: 2x20 Seated hamstring curls with yellow loop 2x10  Seated hamstring curls  2x10 Discussed aquatic PT- issued info handout and pt will discuss with her daughter. Sit to/from stand 2x5 Seated with 2# ankle weight:  LAQ, heel/toe raises, and marching.  2x10 each bilat (cuing for technique and pacing)      PATIENT EDUCATION:  Education details: PT eval findings, anticipated POC, and initial HEP , [email or web address] Person educated: Patient Education method: Explanation, Demonstration, and Handouts Education comprehension: verbalized understanding and returned demonstration  HOME EXERCISE PROGRAM: Access Code: [email or web address] URL: [email or web address] Date: 03/06/2024 Prepared by: Penelope Bowie  Exercises - Supine Bridge  - 2 x daily - 7 x weekly - 1-3 sets - 10 reps - Sit to Stand with Counter Support  - 2 x daily - 3 x weekly - 1 sets - 5 reps - Seated Long Arc Quad  - 3 x daily - 7 x weekly - 2 sets - 10 reps - 5 hold - Seated March   - 3 x daily - 7 x weekly - 3 sets - 10 reps - Seated Heel Toe Raises   - 3 x daily - 7 x weekly - 2 sets - 10 reps - Seated Isometric Hip Adduction with Ball  - 3 x daily - 7 x weekly - 2 sets - 10 reps - Standing March with Counter Support  - 1 x daily - 7 x weekly - 2 sets - 10 reps - Standing Heel Raise with Support  - 1 x daily - 7 x weekly - 1 sets - 10 reps - Standing Hip Abduction with Counter Support  - 1 x daily - 7 x weekly - 1 sets - 10 reps  ASSESSMENT:  CLINICAL IMPRESSION: Patient reports feeling 50% better overall since starting therapy. She reports her back pain feels 40% better overall. Compared to evaluation her 5 times sit to stand time and ODI measure has improved. While performing 3 MWT patient still requires 1 minute seated rest breaks due to fatigue and back pain. She reports her walking tolerance is still limited due to her back pain. She verbalized compliance with HEP. Progressed HEP exercises to include more standing exercises. During treatment session patient required seating rest break after standing  for 2-3 minutes because of back pain and fatigue. She required frequent verbal cues for upright posture while performing standing exercises. Patient would benefit from continued therapy to improve standing tolerance and meet long term goals.     OBJECTIVE IMPAIRMENTS: decreased activity tolerance, decreased balance, difficulty walking, decreased ROM, decreased strength, increased muscle spasms, impaired flexibility, postural dysfunction,  obesity, and pain.   ACTIVITY LIMITATIONS: carrying, lifting, standing, stairs, bed mobility, dressing, reach over head, hygiene/grooming, and locomotion level  PARTICIPATION LIMITATIONS: meal prep, cleaning, laundry, community activity, and church  PERSONAL FACTORS: Age, Fitness, Past/current experiences, Time since onset of injury/illness/exacerbation, and 3+ comorbidities: CVA x 3, L shoulder OA, obesity  are also affecting patient's functional outcome.   REHAB POTENTIAL: Fair due to multiple comorbidities and frequency of visits  CLINICAL DECISION MAKING: Evolving/moderate complexity  EVALUATION COMPLEXITY: Moderate   GOALS: Goals reviewed with patient? Yes  SHORT TERM GOALS: Target date: 02/07/2024   Patient will be independent with initial HEP.  Baseline:  Goal status: MET  2.  Patient will demonstrate improved LE strength by improving 5XSTS by 2-3 seconds.  Baseline: 23.47 Goal status: Met on 02/10/24  3.  Able to complete with RW Baseline: 304 ft with RW Goal status: Met on 02/10/2024   LONG TERM GOALS: Target date: 04/20/2024   Patient will be independent with advanced/ongoing HEP to improve outcomes and carryover.  Baseline:  Goal status: In Progress 03/06/2024  2.  Patient will report 50% improvement in low back pain with ADLs to improve QOL.  Baseline: 40% (02/14/24) Goal status: In Progress 03/06/2024  3.  Patient will demonstrate improved LE strength by being able climb stairs with 50% less shoulder pain.   Baseline:  relies heavily on L UE Goal status: In progress 03/06/2024  4.  Patient will report improved ease of donning her undergarments by 50% Baseline: must do in supine bridge position Goal status: In progress (a little better < 50% better ) 03/06/2024  5.  Patient will score < = 21 on the Modified Oswestry demonstrating improved functional ability.  Baseline: 27 / 50 Goal status: MET 03/06/2024  6.  Patient will demonstrate increased endurance with walking by completing a 5 MWT. Baseline: 2.5 min Goal status: In Progress 03/06/2024    PLAN:  PT FREQUENCY: 2x/week  PT DURATION: 8 weeks  PLANNED INTERVENTIONS: 97164- PT Re-evaluation, 97110-Therapeutic exercises, 97530- Therapeutic activity, 97112- Neuromuscular re-education, 97535- Self Care, 91478- Manual therapy, [phone]- Gait training, [phone]- Electrical stimulation (unattended), [phone]- Traction (mechanical), Patient/Family education, Stair training, Taping, Dry Needling, Joint mobilization, Spinal mobilization, DME instructions, Cryotherapy, and Moist heat.  PLAN FOR NEXT SESSION: walking at beginning of session;  functional LE strength ,balance  Penelope Bowie, PT 03/06/24 1:23 PM [facility] [address] [place], [place] [address] Phone # [phone] Fax [phone]

## 2024-03-08 ENCOUNTER — Encounter: Payer: Self-pay | Admitting: Physical Therapy

## 2024-03-08 ENCOUNTER — Ambulatory Visit: Payer: Medicare HMO | Admitting: Physical Therapy

## 2024-03-08 DIAGNOSIS — M5459 Other low back pain: Secondary | ICD-10-CM

## 2024-03-08 DIAGNOSIS — M6281 Muscle weakness (generalized): Secondary | ICD-10-CM | POA: Diagnosis not present

## 2024-03-08 DIAGNOSIS — R262 Difficulty in walking, not elsewhere classified: Secondary | ICD-10-CM

## 2024-03-08 DIAGNOSIS — R252 Cramp and spasm: Secondary | ICD-10-CM

## 2024-03-08 NOTE — Therapy (Signed)
 OUTPATIENT PHYSICAL THERAPY THORACOLUMBAR TREATMENT    Patient Name: Pamela Cox MRN: [id number] DOB:09/04/1941, 83 y.o., female Today's Date: 03/08/2024  END OF SESSION:  PT End of Session - 03/08/24 1241     Visit Number 10    Date for PT Re-Evaluation 04/20/24    Authorization Type [employer] MCR    Progress Note Due on Visit 19    PT Start Time 1150    PT Stop Time 1230    PT Time Calculation (min) 40 min    Activity Tolerance Patient tolerated treatment well    Behavior During Therapy WFL for tasks assessed/performed                     Past Medical History:  Diagnosis Date   Arthritis    CML (chronic myelocytic leukemia) (HCC)    CVA (cerebral vascular accident) (HCC)    Diabetes mellitus without complication (HCC)    GERD (gastroesophageal reflux disease)    Hypertension    Myelocytic leukemia, chronic (HCC)    Obesity    Sleep apnea    Past Surgical History:  Procedure Laterality Date   APPENDECTOMY     COLONOSCOPY     COLONOSCOPY WITH PROPOFOL N/A 07/16/2019   Procedure: COLONOSCOPY WITH PROPOFOL;  Surgeon: Deveron Fly, MD;  Location: [facility] ENDOSCOPY;  Service: Endoscopy;  Laterality: N/A;   ESOPHAGOGASTRODUODENOSCOPY (EGD) WITH PROPOFOL N/A 07/16/2019   Procedure: ESOPHAGOGASTRODUODENOSCOPY (EGD) WITH PROPOFOL;  Surgeon: Deveron Fly, MD;  Location: [facility] ENDOSCOPY;  Service: Endoscopy;  Laterality: N/A;   TOTAL SHOULDER REPLACEMENT Right 2021   TUBAL LIGATION     Patient Active Problem List   Diagnosis Date Noted   Allergic rhinitis due to pollen 08/23/2018   DDD (degenerative disc disease), lumbar 08/23/2018   Lumbar neuritis 08/23/2018   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   GERD (gastroesophageal reflux disease) 08/23/2018   Morbid obesity (HCC) 08/23/2018   Obstructive sleep apnea 08/23/2018   Intervertebral disc disorder with radiculopathy of lumbar region 08/23/2018   CML (chronic myelocytic leukemia) (HCC) 08/23/2018   Hx of  myocardial perfusion scan 08/23/2018   Osteoarthritis 08/23/2018   Degenerative arthritis of hip 08/23/2018   Lumbar spondylosis 08/23/2018   SOB (shortness of breath) 08/23/2018   History of CVA (cerebrovascular accident) 08/23/2018   Essential hypertension 08/23/2018   Chronic right shoulder pain 08/23/2018   Complete tear of right rotator cuff 08/23/2018   Acute pain of left shoulder 08/23/2018   On antineoplastic chemotherapy 08/23/2018   Hyperlipidemia 08/23/2018   Neurogenic claudication due to lumbar spinal stenosis 08/23/2018   Vitamin D deficiency 08/23/2018    PCP: [facility]   REFERRING PROVIDER: Retta Caster, MD   REFERRING DIAG: [phone] (ICD-10-CM) - Spondylosis of lumbar region without myelopathy or radiculopathy  Low back pain, multifidus muscle training   Rationale for Evaluation and Treatment: Rehabilitation  THERAPY DIAG:  Muscle weakness (generalized)  Difficulty in walking, not elsewhere classified  Other low back pain  Cramp and spasm  ONSET DATE: years  SUBJECTIVE:  SUBJECTIVE STATEMENT: Patient reports she is feeling good today she just has some back soreness.  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 Yes: NPRS scale: 5/10 Pain location: low back Left side worse Pain description: sore and weak Aggravating factors: overhead reaching (putting dishes away) Relieving factors: no  PRECAUTIONS: None  RED FLAGS: None   WEIGHT BEARING RESTRICTIONS: No  FALLS:  Has patient fallen in last 6 months? No  LIVING ENVIRONMENT: Lives with: lives with their family and lives with their daughter Lives in: House/apartment Stairs: 14 steps with rail on left Has  following equipment at home: Otho Blitz - 2 wheeled, Environmental consultant - 4 wheeled, and Wheelchair (manual)  OCCUPATION: retired  PLOF: Independent  PATIENT GOALS: get moving better, decrease soreness, hard to get undergarments on, getting around in bed  NEXT MD VISIT: after PT  OBJECTIVE:  Note: Objective measures were completed at Evaluation unless otherwise noted.  DIAGNOSTIC FINDINGS:  XR 2023: Findings/Impression:   Segmentation: There are five non-rib-bearing lumbar-type vertebral bodies.  Sclerotic degenerative changes along the L5 inferior and S1 superior  endplate.  Bones: Generalized osteopenia. No displaced fracture or loss of vertebral  body height.  Limited evaluation of the sacrum due to overlying bowel gas.  Alignment: Slight lumbar dextrocurvature. Unchanged slight grade 1  anterolisthesis of L3 on L4 and L4 on L5.  Degenerative changes: Interval progression of severe multilevel lumbar  spondylosis from 2021. Mild bilateral hip and bilateral SI joint  degenerative changes. Pubic symphysis appears maintained.   PATIENT SURVEYS:  Eval:  Modified Oswestry  27 / 50 = 54.0 %  03/06/2024 : ODI- 20/50 40%  COGNITION: Overall cognitive status: Within functional limits for tasks assessed     SENSATION: WFL  MUSCLE LENGTH: B HS WNL, R piriformis mild tightness  POSTURE: flexed trunk   PALPATION: Palpation: TTP at gluteals  LUMBAR ROM:   AROM eval  Flexion full  Extension Painful limited 80%  Right lateral flexion Limited and LOB   Left lateral flexion   Right rotation Limited 60%  Left rotation Limited 50%   (Blank rows = not tested)  LOWER EXTREMITY ROM:   WFL for tasks assessed   LOWER EXTREMITY MMT:    MMT Right eval Left eval  Hip flexion 4- 4  Hip extension    Hip abduction    Hip adduction    Hip internal rotation    Hip external rotation    Knee flexion 5 5  Knee extension 5 5  Ankle dorsiflexion 5 5  Ankle plantarflexion    Ankle inversion     Ankle eversion     (Blank rows = not tested)   FUNCTIONAL TESTS:  Eval: 5 times sit to stand: 23.47 with B UE assist 3 minute walk test: 2.5 min 268 ft  02/10/2024: 3 minute walk test: 304 ft with RW (cuing for keeping walker closer to body to allow for improved upright posture) 5 times sit to stand: 21.16 sec with use of bilat UE  02/28/2024 : 357ft with RW (one minute seated rest beak after 1 minute)  03/06/2024 5 STS:20.15 sec with UE support   TREATMENT DATE:  03/08/2024: Nustep level 4 x6 minutes with PT present to discuss status 4 inch step ups x8 each leg Blue stability ball roll outs x 10 5 sec holds Iso trunk extension against black power cord (PT providing resistance) 10 sec holds 2  x 5 Standing hip abduction 2 x 10 at barre Seated thoracic extension over purple ball x 20 Seated ball press for TA activation x 20 Seated hip abduction with red loop x 30 Seated knee flexion with red loop x 20 Seated LAQ 2.5# AW x 10 bilateral     03/06/2024: Nustep level 4 x6 minutes with PT present to discuss status 5STS:20.15 sec with UE support 6 inch step taps with unilateral UE support x 20 Standing hip abduction & marching at barre x 10 each leg Standing heel raise at barre x 10 LAQ 2.5# 2 x 10 bilateral  ODI: 20/50 40%  02/28/2024: : 349ft with RW (one minute seated rest beak after 1 minute) Nustep level 3 x6 minutes with PT present to discuss status Seated hamstring curls with yellow loop2x10 Seated shoulder row with red TB x 10  Trigger Point Dry Needling  Subsequent Treatment: Instructions provided previously at initial dry needling treatment.   Patient Verbal Consent Given: Yes Education Handout Provided: Previously Provided Muscles Treated: Lt lumbar multifidi Electrical Stimulation Performed: No Treatment Response/Outcome: Utilized skilled palpation to identify bony landmarks and trigger points.  Able to illicit twitch response and muscle elongation.   Manual therapy:  Soft tissue mobilization with cocoa butter following to further promote tissue elongation.    02/14/2024: Nustep level 3 x8 minutes with PT present to discuss status Cross punches: 2# Rt and 0# Lt x20 each Seated rows with red band: 2x20 Seated hamstring curls with yellow loop 2x10  Seated hamstring curls 2x10 Discussed aquatic PT- issued info handout and pt will discuss with her daughter. Sit to/from stand 2x5 Seated with 2# ankle weight:  LAQ, heel/toe raises, and marching.  2x10 each bilat (cuing for technique and pacing)      PATIENT EDUCATION:  Education details: PT eval findings, anticipated POC, and initial HEP , [email or web address] Person educated: Patient Education method: Explanation, Demonstration, and Handouts Education comprehension: verbalized understanding and returned demonstration  HOME EXERCISE PROGRAM: Access Code: [email or web address] URL: [email or web address] Date: 03/06/2024 Prepared by: Penelope Bowie  Exercises - Supine Bridge  - 2 x daily - 7 x weekly - 1-3 sets - 10 reps - Sit to Stand with Counter Support  - 2 x daily - 3 x weekly - 1 sets - 5 reps - Seated Long Arc Quad  - 3 x daily - 7 x weekly - 2 sets - 10 reps - 5 hold - Seated March   - 3 x daily - 7 x weekly - 3 sets - 10 reps - Seated Heel Toe Raises   - 3 x daily - 7 x weekly - 2 sets - 10 reps - Seated Isometric Hip Adduction with Ball  - 3 x daily - 7 x weekly - 2 sets - 10 reps - Standing March with Counter Support  - 1 x daily - 7 x weekly - 2 sets - 10 reps - Standing Heel Raise with Support  - 1 x daily - 7 x weekly - 1 sets - 10 reps - Standing Hip Abduction with Counter Support  - 1 x daily - 7 x weekly - 1 sets - 10 reps  ASSESSMENT:  CLINICAL IMPRESSION: Patient presents today with no back pain just some soreness. Incorporated step ups today and patient required heavy use of UEs for safe negotiation. Incorporated resisted thoracic extension and this was a good exercise to  encourage upright posture. Overall, patient tolerated treatment session well and required minimal verbal and visual cues  for correct exercise performance. Patient will benefit from skilled PT to address the below impairments and improve overall function.     OBJECTIVE IMPAIRMENTS: decreased activity tolerance, decreased balance, difficulty walking, decreased ROM, decreased strength, increased muscle spasms, impaired flexibility, postural dysfunction, obesity, and pain.   ACTIVITY LIMITATIONS: carrying, lifting, standing, stairs, bed mobility, dressing, reach over head, hygiene/grooming, and locomotion level  PARTICIPATION LIMITATIONS: meal prep, cleaning, laundry, community activity, and church  PERSONAL FACTORS: Age, Fitness, Past/current experiences, Time since onset of injury/illness/exacerbation, and 3+ comorbidities: CVA x 3, L shoulder OA, obesity  are also affecting patient's functional outcome.   REHAB POTENTIAL: Fair due to multiple comorbidities and frequency of visits  CLINICAL DECISION MAKING: Evolving/moderate complexity  EVALUATION COMPLEXITY: Moderate   GOALS: Goals reviewed with patient? Yes  SHORT TERM GOALS: Target date: 02/07/2024   Patient will be independent with initial HEP.  Baseline:  Goal status: MET  2.  Patient will demonstrate improved LE strength by improving 5XSTS by 2-3 seconds.  Baseline: 23.47 Goal status: Met on 02/10/24  3.  Able to complete with RW Baseline: 304 ft with RW Goal status: Met on 02/10/2024   LONG TERM GOALS: Target date: 04/20/2024   Patient will be independent with advanced/ongoing HEP to improve outcomes and carryover.  Baseline:  Goal status: In Progress 03/06/2024  2.  Patient will report 50% improvement in low back pain with ADLs to improve QOL.  Baseline: 40% (02/14/24) Goal status: In Progress 03/06/2024  3.  Patient will demonstrate improved LE strength by being able climb stairs with 50% less shoulder pain.    Baseline: relies heavily on L UE Goal status: In progress 03/06/2024  4.  Patient will report improved ease of donning her undergarments by 50% Baseline: must do in supine bridge position Goal status: In progress (a little better < 50% better ) 03/06/2024  5.  Patient will score < = 21 on the Modified Oswestry demonstrating improved functional ability.  Baseline: 27 / 50 Goal status: MET 03/06/2024  6.  Patient will demonstrate increased endurance with walking by completing a 5 MWT. Baseline: 2.5 min Goal status: In Progress 03/06/2024    PLAN:  PT FREQUENCY: 2x/week  PT DURATION: 8 weeks  PLANNED INTERVENTIONS: 97164- PT Re-evaluation, 97110-Therapeutic exercises, 97530- Therapeutic activity, 97112- Neuromuscular re-education, 97535- Self Care, 96045- Manual therapy, [phone]- Gait training, [phone]- Electrical stimulation (unattended), [phone]- Traction (mechanical), Patient/Family education, Stair training, Taping, Dry Needling, Joint mobilization, Spinal mobilization, DME instructions, Cryotherapy, and Moist heat.  PLAN FOR NEXT SESSION: walking at beginning of session;  continue functional LE strength ,airex   Penelope Bowie, PT 03/08/24 1:02 PM [facility] [address], [address] [place], [place] [address] Phone # [phone] Fax [phone]

## 2024-03-13 ENCOUNTER — Ambulatory Visit: Payer: Medicare HMO | Admitting: Physical Therapy

## 2024-03-13 ENCOUNTER — Encounter: Payer: Self-pay | Admitting: Physical Therapy

## 2024-03-13 DIAGNOSIS — M5459 Other low back pain: Secondary | ICD-10-CM

## 2024-03-13 DIAGNOSIS — M6281 Muscle weakness (generalized): Secondary | ICD-10-CM

## 2024-03-13 DIAGNOSIS — R262 Difficulty in walking, not elsewhere classified: Secondary | ICD-10-CM

## 2024-03-13 DIAGNOSIS — R252 Cramp and spasm: Secondary | ICD-10-CM

## 2024-03-13 NOTE — Therapy (Signed)
 OUTPATIENT PHYSICAL THERAPY THORACOLUMBAR TREATMENT    Patient Name: Pamela Cox MRN: [id number] DOB:[birth year], 83 y.o., female Today's Date: 03/13/2024  END OF SESSION:  PT End of Session - 03/13/24 1319     Visit Number 11    Date for PT Re-Evaluation 04/20/24    Authorization Type [employer] MCR    Progress Note Due on Visit 19    PT Start Time 1146    PT Stop Time 1220    PT Time Calculation (min) 34 min    Activity Tolerance Patient limited by fatigue    Behavior During Therapy WFL for tasks assessed/performed                      Past Medical History:  Diagnosis Date   Arthritis    CML (chronic myelocytic leukemia) (HCC)    CVA (cerebral vascular accident) (HCC)    Diabetes mellitus without complication (HCC)    GERD (gastroesophageal reflux disease)    Hypertension    Myelocytic leukemia, chronic (HCC)    Obesity    Sleep apnea    Past Surgical History:  Procedure Laterality Date   APPENDECTOMY     COLONOSCOPY     COLONOSCOPY WITH PROPOFOL  N/A 07/16/2019   Procedure: COLONOSCOPY WITH PROPOFOL ;  Surgeon: Deveron Fly, MD;  Location: [facility] ENDOSCOPY;  Service: Endoscopy;  Laterality: N/A;   ESOPHAGOGASTRODUODENOSCOPY (EGD) WITH PROPOFOL  N/A 07/16/2019   Procedure: ESOPHAGOGASTRODUODENOSCOPY (EGD) WITH PROPOFOL ;  Surgeon: Deveron Fly, MD;  Location: [facility] ENDOSCOPY;  Service: Endoscopy;  Laterality: N/A;   TOTAL SHOULDER REPLACEMENT Right 2021   TUBAL LIGATION     Patient Active Problem List   Diagnosis Date Noted   Allergic rhinitis due to pollen 08/23/2018   DDD (degenerative disc disease), lumbar 08/23/2018   Lumbar neuritis 08/23/2018   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   GERD (gastroesophageal reflux disease) 08/23/2018   Morbid obesity (HCC) 08/23/2018   Obstructive sleep apnea 08/23/2018   Intervertebral disc disorder with radiculopathy of lumbar region 08/23/2018   CML (chronic myelocytic leukemia) (HCC) 08/23/2018   Hx of  myocardial perfusion scan 08/23/2018   Osteoarthritis 08/23/2018   Degenerative arthritis of hip 08/23/2018   Lumbar spondylosis 08/23/2018   SOB (shortness of breath) 08/23/2018   History of CVA (cerebrovascular accident) 08/23/2018   Essential hypertension 08/23/2018   Chronic right shoulder pain 08/23/2018   Complete tear of right rotator cuff 08/23/2018   Acute pain of left shoulder 08/23/2018   On antineoplastic chemotherapy 08/23/2018   Hyperlipidemia 08/23/2018   Neurogenic claudication due to lumbar spinal stenosis 08/23/2018   Vitamin D deficiency 08/23/2018    PCP: [facility]   REFERRING PROVIDER: Retta Caster, MD   REFERRING DIAG: [phone] (ICD-10-CM) - Spondylosis of lumbar region without myelopathy or radiculopathy  Low back pain, multifidus muscle training   Rationale for Evaluation and Treatment: Rehabilitation  THERAPY DIAG:  Muscle weakness (generalized)  Difficulty in walking, not elsewhere classified  Other low back pain  Cramp and spasm  ONSET DATE: years  SUBJECTIVE:  SUBJECTIVE STATEMENT: Patient reports her back feels sore today.   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 Yes: NPRS scale: 5/10 Pain location: low back Left side worse Pain description: sore and weak Aggravating factors: overhead reaching (putting dishes away) Relieving factors: no  PRECAUTIONS: None  RED FLAGS: None   WEIGHT BEARING RESTRICTIONS: No  FALLS:  Has patient fallen in last 6 months? No  LIVING ENVIRONMENT: Lives with: lives with their family and lives with their daughter Lives in: House/apartment Stairs: 14 steps with rail on left Has following equipment at home:  Otho Blitz - 2 wheeled, Environmental consultant - 4 wheeled, and Wheelchair (manual)  OCCUPATION: retired  PLOF: Independent  PATIENT GOALS: get moving better, decrease soreness, hard to get undergarments on, getting around in bed  NEXT MD VISIT: after PT  OBJECTIVE:  Note: Objective measures were completed at Evaluation unless otherwise noted.  DIAGNOSTIC FINDINGS:  XR 2023: Findings/Impression:   Segmentation: There are five non-rib-bearing lumbar-type vertebral bodies.  Sclerotic degenerative changes along the L5 inferior and S1 superior  endplate.  Bones: Generalized osteopenia. No displaced fracture or loss of vertebral  body height.  Limited evaluation of the sacrum due to overlying bowel gas.  Alignment: Slight lumbar dextrocurvature. Unchanged slight grade 1  anterolisthesis of L3 on L4 and L4 on L5.  Degenerative changes: Interval progression of severe multilevel lumbar  spondylosis from 2021. Mild bilateral hip and bilateral SI joint  degenerative changes. Pubic symphysis appears maintained.   PATIENT SURVEYS:  Eval:  Modified Oswestry  27 / 50 = 54.0 %  03/06/2024 : ODI- 20/50 40%  COGNITION: Overall cognitive status: Within functional limits for tasks assessed     SENSATION: WFL  MUSCLE LENGTH: B HS WNL, R piriformis mild tightness  POSTURE: flexed trunk   PALPATION: Palpation: TTP at gluteals  LUMBAR ROM:   AROM eval  Flexion full  Extension Painful limited 80%  Right lateral flexion Limited and LOB   Left lateral flexion   Right rotation Limited 60%  Left rotation Limited 50%   (Blank rows = not tested)  LOWER EXTREMITY ROM:   WFL for tasks assessed   LOWER EXTREMITY MMT:    MMT Right eval Left eval  Hip flexion 4- 4  Hip extension    Hip abduction    Hip adduction    Hip internal rotation    Hip external rotation    Knee flexion 5 5  Knee extension 5 5  Ankle dorsiflexion 5 5  Ankle plantarflexion    Ankle inversion    Ankle eversion     (Blank  rows = not tested)   FUNCTIONAL TESTS:  Eval: 5 times sit to stand: 23.47 with B UE assist 3 minute walk test: 2.5 min 268 ft  02/10/2024: 3 minute walk test: 304 ft with RW (cuing for keeping walker closer to body to allow for improved upright posture) 5 times sit to stand: 21.16 sec with use of bilat UE  02/28/2024 : 360ft with RW (one minute seated rest beak after 1 minute)  03/06/2024 5 STS:20.15 sec with UE support   TREATMENT DATE:  03/13/2024: Nustep level 4 3 mins then decreased to level 1 x5 minutes with PT present to discuss status- Pt required frequent rest breaks Seated LAQ 2.5# AW x 10 bilateral  Seated hip flexion 2.5# AW 2 x 10 bilateral  Seated ear to hip with 4#  DB x 10 each direction 6 in step taps with UE support x 10 bilateral  Blue stability ball roll outs x 10 5 sec holds Measured Vitals 111/62 HR 62  (low for patient) patient felt light headed and tired. She did not feel like herself Patient education about hydrating and eating before therapy Session ended early due to patient not feeling well   03/08/2024: Nustep level 4 x6 minutes with PT present to discuss status 4 inch step ups x8 each leg Blue stability ball roll outs x 10 5 sec holds Iso trunk extension against black power cord (PT providing resistance) 10 sec holds 2 x 5 Standing hip abduction 2 x 10 at barre Seated thoracic extension over purple ball x 20 Seated ball press for TA activation x 20 Seated hip abduction with red loop x 30 Seated knee flexion with red loop x 20 Seated LAQ 2.5# AW x 10 bilateral     03/06/2024: Nustep level 4 x6 minutes with PT present to discuss status 5STS:20.15 sec with UE support 6 inch step taps with unilateral UE support x 20 Standing hip abduction & marching at barre x 10 each leg Standing heel raise at barre x 10 LAQ 2.5# 2 x 10 bilateral  ODI: 20/50 40%  02/28/2024: : 367ft with RW (one minute seated rest beak after 1 minute) Nustep level 3 x6  minutes with PT present to discuss status Seated hamstring curls with yellow loop2x10 Seated shoulder row with red TB x 10  Trigger Point Dry Needling  Subsequent Treatment: Instructions provided previously at initial dry needling treatment.   Patient Verbal Consent Given: Yes Education Handout Provided: Previously Provided Muscles Treated: Lt lumbar multifidi Electrical Stimulation Performed: No Treatment Response/Outcome: Utilized skilled palpation to identify bony landmarks and trigger points.  Able to illicit twitch response and muscle elongation.  Manual therapy:  Soft tissue mobilization with cocoa butter following to further promote tissue elongation.       PATIENT EDUCATION:  Education details: PT eval findings, anticipated POC, and initial HEP , [email or web address] Person educated: Patient Education method: Explanation, Demonstration, and Handouts Education comprehension: verbalized understanding and returned demonstration  HOME EXERCISE PROGRAM: Access Code: [email or web address] URL: [email or web address] Date: 03/06/2024 Prepared by: Penelope Bowie  Exercises - Supine Bridge  - 2 x daily - 7 x weekly - 1-3 sets - 10 reps - Sit to Stand with Counter Support  - 2 x daily - 3 x weekly - 1 sets - 5 reps - Seated Long Arc Quad  - 3 x daily - 7 x weekly - 2 sets - 10 reps - 5 hold - Seated March   - 3 x daily - 7 x weekly - 3 sets - 10 reps - Seated Heel Toe Raises   - 3 x daily - 7 x weekly - 2 sets - 10 reps - Seated Isometric Hip Adduction with Ball  - 3 x daily - 7 x weekly - 2 sets - 10 reps - Standing March with Counter Support  - 1 x daily - 7 x weekly - 2 sets - 10 reps - Standing Heel Raise with Support  - 1 x daily - 7 x weekly - 1 sets - 10 reps - Standing Hip Abduction with Counter Support  - 1 x daily - 7 x weekly - 1 sets - 10 reps  ASSESSMENT:  CLINICAL IMPRESSION: Ms Llyod did not feel like her normal self during treatment session. After completing the first four  exercises of the  treatment session she felt out of breath and low energy. She verbalized only having applesauce this morning and she has not been hydrating. Provided patient education about the importance of hydrating when being active. Assessed patient's vital signs and her blood pressure was lower than her normal. Spoke with patient's daughter after treatment session about patient's vital signs and how she felt during treatment session. They are going to follow up with patient's provider. Patient will benefit from skilled PT to address the below impairments and improve overall function.      OBJECTIVE IMPAIRMENTS: decreased activity tolerance, decreased balance, difficulty walking, decreased ROM, decreased strength, increased muscle spasms, impaired flexibility, postural dysfunction, obesity, and pain.   ACTIVITY LIMITATIONS: carrying, lifting, standing, stairs, bed mobility, dressing, reach over head, hygiene/grooming, and locomotion level  PARTICIPATION LIMITATIONS: meal prep, cleaning, laundry, community activity, and church  PERSONAL FACTORS: Age, Fitness, Past/current experiences, Time since onset of injury/illness/exacerbation, and 3+ comorbidities: CVA x 3, L shoulder OA, obesity  are also affecting patient's functional outcome.   REHAB POTENTIAL: Fair due to multiple comorbidities and frequency of visits  CLINICAL DECISION MAKING: Evolving/moderate complexity  EVALUATION COMPLEXITY: Moderate   GOALS: Goals reviewed with patient? Yes  SHORT TERM GOALS: Target date: 02/07/2024   Patient will be independent with initial HEP.  Baseline:  Goal status: MET  2.  Patient will demonstrate improved LE strength by improving 5XSTS by 2-3 seconds.  Baseline: 23.47 Goal status: Met on 02/10/24  3.  Able to complete with RW Baseline: 304 ft with RW Goal status: Met on 02/10/2024   LONG TERM GOALS: Target date: 04/20/2024   Patient will be independent with advanced/ongoing HEP to  improve outcomes and carryover.  Baseline:  Goal status: In Progress 03/06/2024  2.  Patient will report 50% improvement in low back pain with ADLs to improve QOL.  Baseline: 40% (02/14/24) Goal status: In Progress 03/06/2024  3.  Patient will demonstrate improved LE strength by being able climb stairs with 50% less shoulder pain.   Baseline: relies heavily on L UE Goal status: In progress 03/06/2024  4.  Patient will report improved ease of donning her undergarments by 50% Baseline: must do in supine bridge position Goal status: In progress (a little better < 50% better ) 03/06/2024  5.  Patient will score < = 21 on the Modified Oswestry demonstrating improved functional ability.  Baseline: 27 / 50 Goal status: MET 03/06/2024  6.  Patient will demonstrate increased endurance with walking by completing a 5 MWT. Baseline: 2.5 min Goal status: In Progress 03/06/2024    PLAN:  PT FREQUENCY: 2x/week  PT DURATION: 8 weeks  PLANNED INTERVENTIONS: 97164- PT Re-evaluation, 97110-Therapeutic exercises, 97530- Therapeutic activity, 97112- Neuromuscular re-education, 97535- Self Care, 40981- Manual therapy, [phone]- Gait training, [phone]- Electrical stimulation (unattended), [phone]- Traction (mechanical), Patient/Family education, Stair training, Taping, Dry Needling, Joint mobilization, Spinal mobilization, DME instructions, Cryotherapy, and Moist heat.  PLAN FOR NEXT SESSION: assess how patient is feeling; manual to lumbar spine; walking at beginning of session;   Penelope Bowie, PT 03/13/24 1:21 PM [facility] [address] [place], [place] [address] Phone # [phone] Fax [phone]

## 2024-03-20 ENCOUNTER — Encounter: Payer: Self-pay | Admitting: Physical Therapy

## 2024-03-20 ENCOUNTER — Ambulatory Visit: Payer: Medicare HMO | Admitting: Physical Therapy

## 2024-03-20 DIAGNOSIS — R252 Cramp and spasm: Secondary | ICD-10-CM

## 2024-03-20 DIAGNOSIS — M5459 Other low back pain: Secondary | ICD-10-CM

## 2024-03-20 DIAGNOSIS — R262 Difficulty in walking, not elsewhere classified: Secondary | ICD-10-CM

## 2024-03-20 DIAGNOSIS — M6281 Muscle weakness (generalized): Secondary | ICD-10-CM

## 2024-03-20 NOTE — Therapy (Signed)
 OUTPATIENT PHYSICAL THERAPY THORACOLUMBAR TREATMENT    Patient Name: Pamela Cox MRN: [id number] DOB:[birth year], 83 y.o., female Today's Date: 03/20/2024  END OF SESSION:  PT End of Session - 03/20/24 1238     Visit Number 12    Date for PT Re-Evaluation 04/20/24    Authorization Type [employer] MCR    Progress Note Due on Visit 19    PT Start Time 1147    PT Stop Time 1229    PT Time Calculation (min) 42 min    Activity Tolerance Patient tolerated treatment well    Behavior During Therapy WFL for tasks assessed/performed                       Past Medical History:  Diagnosis Date   Arthritis    CML (chronic myelocytic leukemia) (HCC)    CVA (cerebral vascular accident) (HCC)    Diabetes mellitus without complication (HCC)    GERD (gastroesophageal reflux disease)    Hypertension    Myelocytic leukemia, chronic (HCC)    Obesity    Sleep apnea    Past Surgical History:  Procedure Laterality Date   APPENDECTOMY     COLONOSCOPY     COLONOSCOPY WITH PROPOFOL  N/A 07/16/2019   Procedure: COLONOSCOPY WITH PROPOFOL ;  Surgeon: Deveron Fly, MD;  Location: [facility] ENDOSCOPY;  Service: Endoscopy;  Laterality: N/A;   ESOPHAGOGASTRODUODENOSCOPY (EGD) WITH PROPOFOL  N/A 07/16/2019   Procedure: ESOPHAGOGASTRODUODENOSCOPY (EGD) WITH PROPOFOL ;  Surgeon: Deveron Fly, MD;  Location: [facility] ENDOSCOPY;  Service: Endoscopy;  Laterality: N/A;   TOTAL SHOULDER REPLACEMENT Right 2021   TUBAL LIGATION     Patient Active Problem List   Diagnosis Date Noted   Allergic rhinitis due to pollen 08/23/2018   DDD (degenerative disc disease), lumbar 08/23/2018   Lumbar neuritis 08/23/2018   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   GERD (gastroesophageal reflux disease) 08/23/2018   Morbid obesity (HCC) 08/23/2018   Obstructive sleep apnea 08/23/2018   Intervertebral disc disorder with radiculopathy of lumbar region 08/23/2018   CML (chronic myelocytic leukemia) (HCC) 08/23/2018   Hx of  myocardial perfusion scan 08/23/2018   Osteoarthritis 08/23/2018   Degenerative arthritis of hip 08/23/2018   Lumbar spondylosis 08/23/2018   SOB (shortness of breath) 08/23/2018   History of CVA (cerebrovascular accident) 08/23/2018   Essential hypertension 08/23/2018   Chronic right shoulder pain 08/23/2018   Complete tear of right rotator cuff 08/23/2018   Acute pain of left shoulder 08/23/2018   On antineoplastic chemotherapy 08/23/2018   Hyperlipidemia 08/23/2018   Neurogenic claudication due to lumbar spinal stenosis 08/23/2018   Vitamin D deficiency 08/23/2018    PCP: [facility]   REFERRING PROVIDER: Retta Caster, MD   REFERRING DIAG: [phone] (ICD-10-CM) - Spondylosis of lumbar region without myelopathy or radiculopathy  Low back pain, multifidus muscle training   Rationale for Evaluation and Treatment: Rehabilitation  THERAPY DIAG:  Muscle weakness (generalized)  Difficulty in walking, not elsewhere classified  Other low back pain  Cramp and spasm  ONSET DATE: years  SUBJECTIVE:  SUBJECTIVE STATEMENT: Patient reports she is doing better today. Her back feels sore.  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 Yes: NPRS scale: 4/10 Pain location: low back Left side worse Pain description: sore and weak Aggravating factors: overhead reaching (putting dishes away) Relieving factors: no  PRECAUTIONS: None  RED FLAGS: None   WEIGHT BEARING RESTRICTIONS: No  FALLS:  Has patient fallen in last 6 months? No  LIVING ENVIRONMENT: Lives with: lives with their family and lives with their daughter Lives in: House/apartment Stairs: 14 steps with rail on left Has following  equipment at home: Otho Blitz - 2 wheeled, Environmental consultant - 4 wheeled, and Wheelchair (manual)  OCCUPATION: retired  PLOF: Independent  PATIENT GOALS: get moving better, decrease soreness, hard to get undergarments on, getting around in bed  NEXT MD VISIT: after PT  OBJECTIVE:  Note: Objective measures were completed at Evaluation unless otherwise noted.  DIAGNOSTIC FINDINGS:  XR 2023: Findings/Impression:   Segmentation: There are five non-rib-bearing lumbar-type vertebral bodies.  Sclerotic degenerative changes along the L5 inferior and S1 superior  endplate.  Bones: Generalized osteopenia. No displaced fracture or loss of vertebral  body height.  Limited evaluation of the sacrum due to overlying bowel gas.  Alignment: Slight lumbar dextrocurvature. Unchanged slight grade 1  anterolisthesis of L3 on L4 and L4 on L5.  Degenerative changes: Interval progression of severe multilevel lumbar  spondylosis from 2021. Mild bilateral hip and bilateral SI joint  degenerative changes. Pubic symphysis appears maintained.   PATIENT SURVEYS:  Eval:  Modified Oswestry  27 / 50 = 54.0 %  03/06/2024 : ODI- 20/50 40%  COGNITION: Overall cognitive status: Within functional limits for tasks assessed     SENSATION: WFL  MUSCLE LENGTH: B HS WNL, R piriformis mild tightness  POSTURE: flexed trunk   PALPATION: Palpation: TTP at gluteals  LUMBAR ROM:   AROM eval  Flexion full  Extension Painful limited 80%  Right lateral flexion Limited and LOB   Left lateral flexion   Right rotation Limited 60%  Left rotation Limited 50%   (Blank rows = not tested)  LOWER EXTREMITY ROM:   WFL for tasks assessed   LOWER EXTREMITY MMT:    MMT Right eval Left eval  Hip flexion 4- 4  Hip extension    Hip abduction    Hip adduction    Hip internal rotation    Hip external rotation    Knee flexion 5 5  Knee extension 5 5  Ankle dorsiflexion 5 5  Ankle plantarflexion    Ankle inversion    Ankle  eversion     (Blank rows = not tested)   FUNCTIONAL TESTS:  Eval: 5 times sit to stand: 23.47 with B UE assist 3 minute walk test: 2.5 min 268 ft  02/10/2024: 3 minute walk test: 304 ft with RW (cuing for keeping walker closer to body to allow for improved upright posture) 5 times sit to stand: 21.16 sec with use of bilat UE  02/28/2024 : 370ft with RW (one minute seated rest beak after 1 minute)  03/06/2024 5 STS:20.15 sec with UE support   TREATMENT DATE:  03/20/2024: Nustep level 2 6 mins PT present to discuss status Seated LAQ 2.5# AW 2 x 10 bilateral  Seated hip flexion 2.5# AW 2 x 10 bilateral  Seated abduction with red loop x 20 Seated hip adduction with ball x 20 Sit  to stand x 6 6 in step taps with UE support x 10 bilateral  Single leg multi color circle tap with PT verbalizing colors (bilateral UE support) x 2 mins Blue stability ball roll outs x 10 5 sec holds Manual: Addaday to bilateral lumbar paraspinals for improved circulation and tissue mobility    03/13/2024: Nustep level 4 3 mins then decreased to level 1 x5 minutes with PT present to discuss status- Pt required frequent rest breaks Seated LAQ 2.5# AW x 10 bilateral  Seated hip flexion 2.5# AW 2 x 10 bilateral  Seated ear to hip with 4# DB x 10 each direction 6 in step taps with UE support x 10 bilateral  Blue stability ball roll outs x 10 5 sec holds Measured Vitals 111/62 HR 62  (low for patient) patient felt light headed and tired. She did not feel like herself Patient education about hydrating and eating before therapy Session ended early due to patient not feeling well   03/08/2024: Nustep level 4 x6 minutes with PT present to discuss status 4 inch step ups x8 each leg Blue stability ball roll outs x 10 5 sec holds Iso trunk extension against black power cord (PT providing resistance) 10 sec holds 2 x 5 Standing hip abduction 2 x 10 at barre Seated thoracic extension over purple ball x  20 Seated ball press for TA activation x 20 Seated hip abduction with red loop x 30 Seated knee flexion with red loop x 20 Seated LAQ 2.5# AW x 10 bilateral     PATIENT EDUCATION:  Education details: PT eval findings, anticipated POC, and initial HEP , [email or web address] Person educated: Patient Education method: Explanation, Demonstration, and Handouts Education comprehension: verbalized understanding and returned demonstration  HOME EXERCISE PROGRAM: Access Code: [email or web address] URL: [email or web address] Date: 03/06/2024 Prepared by: Penelope Bowie  Exercises - Supine Bridge  - 2 x daily - 7 x weekly - 1-3 sets - 10 reps - Sit to Stand with Counter Support  - 2 x daily - 3 x weekly - 1 sets - 5 reps - Seated Long Arc Quad  - 3 x daily - 7 x weekly - 2 sets - 10 reps - 5 hold - Seated March   - 3 x daily - 7 x weekly - 3 sets - 10 reps - Seated Heel Toe Raises   - 3 x daily - 7 x weekly - 2 sets - 10 reps - Seated Isometric Hip Adduction with Ball  - 3 x daily - 7 x weekly - 2 sets - 10 reps - Standing March with Counter Support  - 1 x daily - 7 x weekly - 2 sets - 10 reps - Standing Heel Raise with Support  - 1 x daily - 7 x weekly - 1 sets - 10 reps - Standing Hip Abduction with Counter Support  - 1 x daily - 7 x weekly - 1 sets - 10 reps  ASSESSMENT:  CLINICAL IMPRESSION: Ms. Amorim is back to feeling like her normal self. She tolerated treatment session well and did not demonstrate any fatigue and shortness of breath. Incorporated single leg balance activities and patient required bilateral UE support. Patient did not required any verbal cues for upright posture during standing exercises. Patient verbalize relief in back pain with manual techniques. Patient will benefit from skilled PT to address the below impairments and improve overall function.       OBJECTIVE IMPAIRMENTS: decreased activity tolerance, decreased balance, difficulty walking, decreased ROM, decreased  strength, increased muscle spasms, impaired flexibility, postural dysfunction, obesity, and pain.   ACTIVITY LIMITATIONS: carrying, lifting, standing, stairs, bed mobility, dressing, reach over head, hygiene/grooming, and locomotion level  PARTICIPATION LIMITATIONS: meal prep, cleaning, laundry, community activity, and church  PERSONAL FACTORS: Age, Fitness, Past/current experiences, Time since onset of injury/illness/exacerbation, and 3+ comorbidities: CVA x 3, L shoulder OA, obesity  are also affecting patient's functional outcome.   REHAB POTENTIAL: Fair due to multiple comorbidities and frequency of visits  CLINICAL DECISION MAKING: Evolving/moderate complexity  EVALUATION COMPLEXITY: Moderate   GOALS: Goals reviewed with patient? Yes  SHORT TERM GOALS: Target date: 02/07/2024   Patient will be independent with initial HEP.  Baseline:  Goal status: MET  2.  Patient will demonstrate improved LE strength by improving 5XSTS by 2-3 seconds.  Baseline: 23.47 Goal status: Met on 02/10/24  3.  Able to complete with RW Baseline: 304 ft with RW Goal status: Met on 02/10/2024   LONG TERM GOALS: Target date: 04/20/2024   Patient will be independent with advanced/ongoing HEP to improve outcomes and carryover.  Baseline:  Goal status: In Progress 03/06/2024  2.  Patient will report 50% improvement in low back pain with ADLs to improve QOL.  Baseline: 40% (02/14/24) Goal status: In Progress 03/06/2024  3.  Patient will demonstrate improved LE strength by being able climb stairs with 50% less shoulder pain.   Baseline: relies heavily on L UE Goal status: In progress 03/06/2024  4.  Patient will report improved ease of donning her undergarments by 50% Baseline: must do in supine bridge position Goal status: In progress (a little better < 50% better ) 03/06/2024  5.  Patient will score < = 21 on the Modified Oswestry demonstrating improved functional ability.  Baseline: 27 /  50 Goal status: MET 03/06/2024  6.  Patient will demonstrate increased endurance with walking by completing a 5 MWT. Baseline: 2.5 min Goal status: In Progress 03/06/2024    PLAN:  PT FREQUENCY: 2x/week  PT DURATION: 8 weeks  PLANNED INTERVENTIONS: 97164- PT Re-evaluation, 97110-Therapeutic exercises, 97530- Therapeutic activity, 97112- Neuromuscular re-education, 97535- Self Care, 16109- Manual therapy, [phone]- Gait training, [phone]- Electrical stimulation (unattended), [phone]- Traction (mechanical), Patient/Family education, Stair training, Taping, Dry Needling, Joint mobilization, Spinal mobilization, DME instructions, Cryotherapy, and Moist heat.  PLAN FOR NEXT SESSION: core exercises; walking; leg strengthening ; manual as indicated  Penelope Bowie, PT 03/20/24 12:40 PM [facility] [address] [place], [place] [address] Phone # [phone] Fax [phone]

## 2024-03-27 ENCOUNTER — Ambulatory Visit: Attending: Anesthesiology

## 2024-03-27 DIAGNOSIS — M6281 Muscle weakness (generalized): Secondary | ICD-10-CM | POA: Diagnosis present

## 2024-03-27 DIAGNOSIS — M5459 Other low back pain: Secondary | ICD-10-CM | POA: Insufficient documentation

## 2024-03-27 DIAGNOSIS — R2681 Unsteadiness on feet: Secondary | ICD-10-CM | POA: Insufficient documentation

## 2024-03-27 DIAGNOSIS — R252 Cramp and spasm: Secondary | ICD-10-CM | POA: Diagnosis present

## 2024-03-27 DIAGNOSIS — R262 Difficulty in walking, not elsewhere classified: Secondary | ICD-10-CM | POA: Diagnosis present

## 2024-03-27 NOTE — Therapy (Signed)
 OUTPATIENT PHYSICAL THERAPY THORACOLUMBAR TREATMENT    Patient Name: Pamela Cox MRN: [id number] DOB:11-05-1941, 83 y.o., female Today's Date: 03/27/2024  END OF SESSION:  PT End of Session - 03/27/24 1246     Visit Number 13    Date for PT Re-Evaluation 04/20/24    Authorization Type [employer] MCR    Progress Note Due on Visit 19    PT Start Time 1149    PT Stop Time 1234    PT Time Calculation (min) 45 min    Activity Tolerance Patient tolerated treatment well    Behavior During Therapy WFL for tasks assessed/performed                        Past Medical History:  Diagnosis Date   Arthritis    CML (chronic myelocytic leukemia) (HCC)    CVA (cerebral vascular accident) (HCC)    Diabetes mellitus without complication (HCC)    GERD (gastroesophageal reflux disease)    Hypertension    Myelocytic leukemia, chronic (HCC)    Obesity    Sleep apnea    Past Surgical History:  Procedure Laterality Date   APPENDECTOMY     COLONOSCOPY     COLONOSCOPY WITH PROPOFOL  N/A 07/16/2019   Procedure: COLONOSCOPY WITH PROPOFOL ;  Surgeon: Deveron Fly, MD;  Location: [facility] ENDOSCOPY;  Service: Endoscopy;  Laterality: N/A;   ESOPHAGOGASTRODUODENOSCOPY (EGD) WITH PROPOFOL  N/A 07/16/2019   Procedure: ESOPHAGOGASTRODUODENOSCOPY (EGD) WITH PROPOFOL ;  Surgeon: Deveron Fly, MD;  Location: [facility] ENDOSCOPY;  Service: Endoscopy;  Laterality: N/A;   TOTAL SHOULDER REPLACEMENT Right 2021   TUBAL LIGATION     Patient Active Problem List   Diagnosis Date Noted   Allergic rhinitis due to pollen 08/23/2018   DDD (degenerative disc disease), lumbar 08/23/2018   Lumbar neuritis 08/23/2018   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   GERD (gastroesophageal reflux disease) 08/23/2018   Morbid obesity (HCC) 08/23/2018   Obstructive sleep apnea 08/23/2018   Intervertebral disc disorder with radiculopathy of lumbar region 08/23/2018   CML (chronic myelocytic leukemia) (HCC) 08/23/2018   Hx  of myocardial perfusion scan 08/23/2018   Osteoarthritis 08/23/2018   Degenerative arthritis of hip 08/23/2018   Lumbar spondylosis 08/23/2018   SOB (shortness of breath) 08/23/2018   History of CVA (cerebrovascular accident) 08/23/2018   Essential hypertension 08/23/2018   Chronic right shoulder pain 08/23/2018   Complete tear of right rotator cuff 08/23/2018   Acute pain of left shoulder 08/23/2018   On antineoplastic chemotherapy 08/23/2018   Hyperlipidemia 08/23/2018   Neurogenic claudication due to lumbar spinal stenosis 08/23/2018   Vitamin D deficiency 08/23/2018    PCP: [facility]   REFERRING PROVIDER: Retta Caster, MD   REFERRING DIAG: [phone] (ICD-10-CM) - Spondylosis of lumbar region without myelopathy or radiculopathy  Low back pain, multifidus muscle training   Rationale for Evaluation and Treatment: Rehabilitation  THERAPY DIAG:  Muscle weakness (generalized)  Difficulty in walking, not elsewhere classified  Other low back pain  Cramp and spasm  Unsteadiness on feet  ONSET DATE: years  SUBJECTIVE:  SUBJECTIVE STATEMENT: LBP today per daughter report.   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 Yes: NPRS scale: 7/10 Pain location: low back Left side worse Pain description: sore and weak Aggravating factors: overhead reaching (putting dishes away) Relieving factors: no  PRECAUTIONS: None  RED FLAGS: None   WEIGHT BEARING RESTRICTIONS: No  FALLS:  Has patient fallen in last 6 months? No  LIVING ENVIRONMENT: Lives with: lives with their family and lives with their daughter Lives in: House/apartment Stairs: 14 steps with rail on left Has following equipment  at home: Otho Blitz - 2 wheeled, Environmental consultant - 4 wheeled, and Wheelchair (manual)  OCCUPATION: retired  PLOF: Independent  PATIENT GOALS: get moving better, decrease soreness, hard to get undergarments on, getting around in bed  NEXT MD VISIT: after PT  OBJECTIVE:  Note: Objective measures were completed at Evaluation unless otherwise noted.  DIAGNOSTIC FINDINGS:  XR 2023: Findings/Impression:   Segmentation: There are five non-rib-bearing lumbar-type vertebral bodies.  Sclerotic degenerative changes along the L5 inferior and S1 superior  endplate.  Bones: Generalized osteopenia. No displaced fracture or loss of vertebral  body height.  Limited evaluation of the sacrum due to overlying bowel gas.  Alignment: Slight lumbar dextrocurvature. Unchanged slight grade 1  anterolisthesis of L3 on L4 and L4 on L5.  Degenerative changes: Interval progression of severe multilevel lumbar  spondylosis from 2021. Mild bilateral hip and bilateral SI joint  degenerative changes. Pubic symphysis appears maintained.   PATIENT SURVEYS:  Eval:  Modified Oswestry  27 / 50 = 54.0 %  03/06/2024 : ODI- 20/50 40%  COGNITION: Overall cognitive status: Within functional limits for tasks assessed     SENSATION: WFL  MUSCLE LENGTH: B HS WNL, R piriformis mild tightness  POSTURE: flexed trunk   PALPATION: Palpation: TTP at gluteals  LUMBAR ROM:   AROM eval  Flexion full  Extension Painful limited 80%  Right lateral flexion Limited and LOB   Left lateral flexion   Right rotation Limited 60%  Left rotation Limited 50%   (Blank rows = not tested)  LOWER EXTREMITY ROM:   WFL for tasks assessed   LOWER EXTREMITY MMT:    MMT Right eval Left eval  Hip flexion 4- 4  Hip extension    Hip abduction    Hip adduction    Hip internal rotation    Hip external rotation    Knee flexion 5 5  Knee extension 5 5  Ankle dorsiflexion 5 5  Ankle plantarflexion    Ankle inversion    Ankle eversion      (Blank rows = not tested)   FUNCTIONAL TESTS:  Eval: 5 times sit to stand: 23.47 with B UE assist 3 minute walk test: 2.5 min 268 ft  02/10/2024: 3 minute walk test: 304 ft with RW (cuing for keeping walker closer to body to allow for improved upright posture) 5 times sit to stand: 21.16 sec with use of bilat UE  02/28/2024 : 348ft with RW (one minute seated rest beak after 1 minute)  03/06/2024 5 STS:20.15 sec with UE support   TREATMENT DATE:   03/27/2024: Nustep level 2 6 mins PT present to discuss status Seated LAQ 2.5# AW 2 x 10 bilateral  Seated hip flexion 2.5# AW 2 x 10 bilateral  Seated abduction with red loop x 20 Seated hip adduction with ball x 20- 5 second hold 6 in  step taps with UE support x 10 bilateral  Standing: shoulder to hip diagonals with blue weighted ball 2x10 each  Blue stability ball roll outs x 10 5 sec holds Manual: Addaday to bilateral lumbar paraspinals for improved circulation and tissue mobility- seated and resting arms at barre   03/20/2024: Nustep level 2 6 mins PT present to discuss status Seated LAQ 2.5# AW 2 x 10 bilateral  Seated hip flexion 2.5# AW 2 x 10 bilateral  Seated abduction with red loop x 20 Seated hip adduction with ball x 20 Sit to stand x 6 6 in step taps with UE support x 10 bilateral  Single leg multi color circle tap with PT verbalizing colors (bilateral UE support) x 2 mins Blue stability ball roll outs x 10 5 sec holds Manual: Addaday to bilateral lumbar paraspinals for improved circulation and tissue mobility    03/13/2024: Nustep level 4 3 mins then decreased to level 1 x5 minutes with PT present to discuss status- Pt required frequent rest breaks Seated LAQ 2.5# AW x 10 bilateral  Seated hip flexion 2.5# AW 2 x 10 bilateral  Seated ear to hip with 4# DB x 10 each direction 6 in step taps with UE support x 10 bilateral  Blue stability ball roll outs x 10 5 sec holds Measured Vitals 111/62 HR 62  (low for  patient) patient felt light headed and tired. She did not feel like herself Patient education about hydrating and eating before therapy Session ended early due to patient not feeling well   PATIENT EDUCATION:  Education details: PT eval findings, anticipated POC, and initial HEP , [email or web address] Person educated: Patient Education method: Explanation, Demonstration, and Handouts Education comprehension: verbalized understanding and returned demonstration  HOME EXERCISE PROGRAM: Access Code: [email or web address] URL: [email or web address] Date: 03/06/2024 Prepared by: Penelope Bowie  Exercises - Supine Bridge  - 2 x daily - 7 x weekly - 1-3 sets - 10 reps - Sit to Stand with Counter Support  - 2 x daily - 3 x weekly - 1 sets - 5 reps - Seated Long Arc Quad  - 3 x daily - 7 x weekly - 2 sets - 10 reps - 5 hold - Seated March   - 3 x daily - 7 x weekly - 3 sets - 10 reps - Seated Heel Toe Raises   - 3 x daily - 7 x weekly - 2 sets - 10 reps - Seated Isometric Hip Adduction with Ball  - 3 x daily - 7 x weekly - 2 sets - 10 reps - Standing March with Counter Support  - 1 x daily - 7 x weekly - 2 sets - 10 reps - Standing Heel Raise with Support  - 1 x daily - 7 x weekly - 1 sets - 10 reps - Standing Hip Abduction with Counter Support  - 1 x daily - 7 x weekly - 1 sets - 10 reps  ASSESSMENT:  CLINICAL IMPRESSION: Pt arrived with 7/10 LBP that is consistent for this patient. She tolerated treatment session well and did report appropriate fatigue at end of session. PT monitored throughout session for pain, fatigue and safety.  She has limited standing tolerance and is able to remain upright without cueing for short periods. Patient verbalize relief in back pain with manual techniques. Patient will benefit from skilled PT to address the below impairments and improve overall function.       OBJECTIVE IMPAIRMENTS: decreased activity tolerance, decreased balance, difficulty walking, decreased  ROM,  decreased strength, increased muscle spasms, impaired flexibility, postural dysfunction, obesity, and pain.   ACTIVITY LIMITATIONS: carrying, lifting, standing, stairs, bed mobility, dressing, reach over head, hygiene/grooming, and locomotion level  PARTICIPATION LIMITATIONS: meal prep, cleaning, laundry, community activity, and church  PERSONAL FACTORS: Age, Fitness, Past/current experiences, Time since onset of injury/illness/exacerbation, and 3+ comorbidities: CVA x 3, L shoulder OA, obesity  are also affecting patient's functional outcome.   REHAB POTENTIAL: Fair due to multiple comorbidities and frequency of visits  CLINICAL DECISION MAKING: Evolving/moderate complexity  EVALUATION COMPLEXITY: Moderate   GOALS: Goals reviewed with patient? Yes  SHORT TERM GOALS: Target date: 02/07/2024   Patient will be independent with initial HEP.  Baseline:  Goal status: MET  2.  Patient will demonstrate improved LE strength by improving 5XSTS by 2-3 seconds.  Baseline: 23.47 Goal status: Met on 02/10/24  3.  Able to complete with RW Baseline: 304 ft with RW Goal status: Met on 02/10/2024   LONG TERM GOALS: Target date: 04/20/2024   Patient will be independent with advanced/ongoing HEP to improve outcomes and carryover.  Baseline:  Goal status: In Progress 03/06/2024  2.  Patient will report 50% improvement in low back pain with ADLs to improve QOL.  Baseline: 40% (02/14/24) Goal status: In Progress 03/06/2024  3.  Patient will demonstrate improved LE strength by being able climb stairs with 50% less shoulder pain.   Baseline: relies heavily on L UE Goal status: In progress 03/06/2024  4.  Patient will report improved ease of donning her undergarments by 50% Baseline: must do in supine bridge position Goal status: In progress (a little better < 50% better ) 03/06/2024  5.  Patient will score < = 21 on the Modified Oswestry demonstrating improved functional ability.  Baseline:  27 / 50 Goal status: MET 03/06/2024  6.  Patient will demonstrate increased endurance with walking by completing a 5 MWT. Baseline: 2.5 min Goal status: In Progress 03/06/2024    PLAN:  PT FREQUENCY: 2x/week  PT DURATION: 8 weeks  PLANNED INTERVENTIONS: 97164- PT Re-evaluation, 97110-Therapeutic exercises, 97530- Therapeutic activity, 97112- Neuromuscular re-education, 97535- Self Care, 16109- Manual therapy, [phone]- Gait training, [phone]- Electrical stimulation (unattended), [phone]- Traction (mechanical), Patient/Family education, Stair training, Taping, Dry Needling, Joint mobilization, Spinal mobilization, DME instructions, Cryotherapy, and Moist heat.  PLAN FOR NEXT SESSION: core exercises; walking; leg strengthening ; manual as indicated  Luella Sager, PT 03/27/24 12:49 PM  [facility] [address], [address] [place], [place] [address] Phone # [phone] Fax [phone]

## 2024-04-03 ENCOUNTER — Ambulatory Visit: Admitting: Rehabilitative and Restorative Service Providers"

## 2024-04-03 ENCOUNTER — Encounter: Payer: Self-pay | Admitting: Rehabilitative and Restorative Service Providers"

## 2024-04-03 DIAGNOSIS — M6281 Muscle weakness (generalized): Secondary | ICD-10-CM

## 2024-04-03 DIAGNOSIS — R252 Cramp and spasm: Secondary | ICD-10-CM

## 2024-04-03 DIAGNOSIS — M5459 Other low back pain: Secondary | ICD-10-CM

## 2024-04-03 DIAGNOSIS — R262 Difficulty in walking, not elsewhere classified: Secondary | ICD-10-CM

## 2024-04-03 NOTE — Therapy (Signed)
 OUTPATIENT PHYSICAL THERAPY THORACOLUMBAR TREATMENT    Patient Name: Pamela Cox MRN: [id number] DOB:1940-12-09, 83 y.o., female Today's Date: 04/03/2024  END OF SESSION:  PT End of Session - 04/03/24 1149     Visit Number 14    Date for PT Re-Evaluation 04/20/24    Authorization Type [employer] MCR    Progress Note Due on Visit 19    PT Start Time 1145    PT Stop Time 1225    PT Time Calculation (min) 40 min    Activity Tolerance Patient tolerated treatment well    Behavior During Therapy WFL for tasks assessed/performed                        Past Medical History:  Diagnosis Date   Arthritis    CML (chronic myelocytic leukemia) (HCC)    CVA (cerebral vascular accident) (HCC)    Diabetes mellitus without complication (HCC)    GERD (gastroesophageal reflux disease)    Hypertension    Myelocytic leukemia, chronic (HCC)    Obesity    Sleep apnea    Past Surgical History:  Procedure Laterality Date   APPENDECTOMY     COLONOSCOPY     COLONOSCOPY WITH PROPOFOL  N/A 07/16/2019   Procedure: COLONOSCOPY WITH PROPOFOL ;  Surgeon: Deveron Fly, MD;  Location: [facility] ENDOSCOPY;  Service: Endoscopy;  Laterality: N/A;   ESOPHAGOGASTRODUODENOSCOPY (EGD) WITH PROPOFOL  N/A 07/16/2019   Procedure: ESOPHAGOGASTRODUODENOSCOPY (EGD) WITH PROPOFOL ;  Surgeon: Deveron Fly, MD;  Location: [facility] ENDOSCOPY;  Service: Endoscopy;  Laterality: N/A;   TOTAL SHOULDER REPLACEMENT Right 2021   TUBAL LIGATION     Patient Active Problem List   Diagnosis Date Noted   Allergic rhinitis due to pollen 08/23/2018   DDD (degenerative disc disease), lumbar 08/23/2018   Lumbar neuritis 08/23/2018   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   GERD (gastroesophageal reflux disease) 08/23/2018   Morbid obesity (HCC) 08/23/2018   Obstructive sleep apnea 08/23/2018   Intervertebral disc disorder with radiculopathy of lumbar region 08/23/2018   CML (chronic myelocytic leukemia) (HCC) 08/23/2018   Hx  of myocardial perfusion scan 08/23/2018   Osteoarthritis 08/23/2018   Degenerative arthritis of hip 08/23/2018   Lumbar spondylosis 08/23/2018   SOB (shortness of breath) 08/23/2018   History of CVA (cerebrovascular accident) 08/23/2018   Essential hypertension 08/23/2018   Chronic right shoulder pain 08/23/2018   Complete tear of right rotator cuff 08/23/2018   Acute pain of left shoulder 08/23/2018   On antineoplastic chemotherapy 08/23/2018   Hyperlipidemia 08/23/2018   Neurogenic claudication due to lumbar spinal stenosis 08/23/2018   Vitamin D deficiency 08/23/2018    PCP: [facility]   REFERRING PROVIDER: Retta Caster, MD   REFERRING DIAG: [phone] (ICD-10-CM) - Spondylosis of lumbar region without myelopathy or radiculopathy  Low back pain, multifidus muscle training   Rationale for Evaluation and Treatment: Rehabilitation  THERAPY DIAG:  Muscle weakness (generalized)  Difficulty in walking, not elsewhere classified  Other low back pain  Cramp and spasm  ONSET DATE: years  SUBJECTIVE:  SUBJECTIVE STATEMENT: "I've been exercising at home; I have to do something"  PERTINENT HISTORY:   R TSA, chroinic myelocytic leukemia, 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 Yes: NPRS scale: 6/10 Pain location: low back Left side worse Pain description: sore and weak Aggravating factors: overhead reaching (putting dishes away) Relieving factors: no  PRECAUTIONS: None  RED FLAGS: None   WEIGHT BEARING RESTRICTIONS: No  FALLS:  Has patient fallen in last 6 months? No  LIVING ENVIRONMENT: Lives with: lives with their family and lives with their daughter Lives in: House/apartment Stairs: 14 steps with rail on left Has following equipment at  home: Otho Blitz - 2 wheeled, Environmental consultant - 4 wheeled, and Wheelchair (manual)  OCCUPATION: retired  PLOF: Independent  PATIENT GOALS: get moving better, decrease soreness, hard to get undergarments on, getting around in bed  NEXT MD VISIT: after PT  OBJECTIVE:  Note: Objective measures were completed at Evaluation unless otherwise noted.  DIAGNOSTIC FINDINGS:  XR 2023: Findings/Impression:   Segmentation: There are five non-rib-bearing lumbar-type vertebral bodies.  Sclerotic degenerative changes along the L5 inferior and S1 superior  endplate.  Bones: Generalized osteopenia. No displaced fracture or loss of vertebral  body height.  Limited evaluation of the sacrum due to overlying bowel gas.  Alignment: Slight lumbar dextrocurvature. Unchanged slight grade 1  anterolisthesis of L3 on L4 and L4 on L5.  Degenerative changes: Interval progression of severe multilevel lumbar  spondylosis from 2021. Mild bilateral hip and bilateral SI joint  degenerative changes. Pubic symphysis appears maintained.   PATIENT SURVEYS:  Eval:  Modified Oswestry  27 / 50 = 54.0 %  03/06/2024 : ODI- 20/50 40% 04/03/24:  Modified Oswestry Low Back Pain Disability Questionnaire: 25 / 50 = 50.0 %  COGNITION: Overall cognitive status: Within functional limits for tasks assessed     SENSATION: WFL  MUSCLE LENGTH: B HS WNL, R piriformis mild tightness  POSTURE: flexed trunk   PALPATION: Palpation: TTP at gluteals  LUMBAR ROM:   AROM eval  Flexion full  Extension Painful limited 80%  Right lateral flexion Limited and LOB   Left lateral flexion   Right rotation Limited 60%  Left rotation Limited 50%   (Blank rows = not tested)  LOWER EXTREMITY ROM:   WFL for tasks assessed   LOWER EXTREMITY MMT:    MMT Right eval Left eval  Hip flexion 4- 4  Hip extension    Hip abduction    Hip adduction    Hip internal rotation    Hip external rotation    Knee flexion 5 5  Knee extension 5 5  Ankle  dorsiflexion 5 5  Ankle plantarflexion    Ankle inversion    Ankle eversion     (Blank rows = not tested)   FUNCTIONAL TESTS:  Eval: 5 times sit to stand: 23.47 with B UE assist 3 minute walk test: 2.5 min 268 ft  02/10/2024: 3 minute walk test: 304 ft with RW (cuing for keeping walker closer to body to allow for improved upright posture) 5 times sit to stand: 21.16 sec with use of bilat UE  02/28/2024 : 362ft with RW (one minute seated rest beak after 1 minute)  03/06/2024 5 STS:20.15 sec with UE support  04/03/2024: 5 times sit to stand:  22.50 sec with UE support Timed up and Go (TUG):  19.63 sec with RW 3 minute walk test:  304 ft with RW (one brief seated recovery  period)   TREATMENT DATE:   5/132025: Nustep level 5 x6 min with PT present to discuss status Sit to/from stand x5 3 minute walk for 304 ft with RW Seated with 2.5#:  long arc quad, marching, hip ER.  2x10 each bilat Trigger Point Dry Needling Subsequent Treatment: Instructions provided previously at initial dry needling treatment.  Patient Verbal Consent Given: Yes Education Handout Provided: Previously Provided Muscles Treated: bilateral lumbar multifidi Electrical Stimulation Performed: No Treatment Response/Outcome: Utilized skilled palpation to identify bony landmarks and trigger points.  Able to illicit twitch response and muscle elongation.  Soft tissue mobilization following to further promote tissue elongation.     03/27/2024: Nustep level 2 6 mins PT present to discuss status Seated LAQ 2.5# AW 2 x 10 bilateral  Seated hip flexion 2.5# AW 2 x 10 bilateral  Seated abduction with red loop x 20 Seated hip adduction with ball x 20- 5 second hold 6 in step taps with UE support x 10 bilateral  Standing: shoulder to hip diagonals with blue weighted ball 2x10 each  Blue stability ball roll outs x 10 5 sec holds Manual: Addaday to bilateral lumbar paraspinals for improved circulation and tissue  mobility- seated and resting arms at barre   03/20/2024: Nustep level 2 6 mins PT present to discuss status Seated LAQ 2.5# AW 2 x 10 bilateral  Seated hip flexion 2.5# AW 2 x 10 bilateral  Seated abduction with red loop x 20 Seated hip adduction with ball x 20 Sit to stand x 6 6 in step taps with UE support x 10 bilateral  Single leg multi color circle tap with PT verbalizing colors (bilateral UE support) x 2 mins Blue stability ball roll outs x 10 5 sec holds Manual: Addaday to bilateral lumbar paraspinals for improved circulation and tissue mobility    PATIENT EDUCATION:  Education details: PT eval findings, anticipated POC, and initial HEP , [email or web address] Person educated: Patient Education method: Explanation, Demonstration, and Handouts Education comprehension: verbalized understanding and returned demonstration  HOME EXERCISE PROGRAM: Access Code: [email or web address] URL: [email or web address] Date: 03/06/2024 Prepared by: Penelope Bowie  Exercises - Supine Bridge  - 2 x daily - 7 x weekly - 1-3 sets - 10 reps - Sit to Stand with Counter Support  - 2 x daily - 3 x weekly - 1 sets - 5 reps - Seated Long Arc Quad  - 3 x daily - 7 x weekly - 2 sets - 10 reps - 5 hold - Seated March   - 3 x daily - 7 x weekly - 3 sets - 10 reps - Seated Heel Toe Raises   - 3 x daily - 7 x weekly - 2 sets - 10 reps - Seated Isometric Hip Adduction with Ball  - 3 x daily - 7 x weekly - 2 sets - 10 reps - Standing March with Counter Support  - 1 x daily - 7 x weekly - 2 sets - 10 reps - Standing Heel Raise with Support  - 1 x daily - 7 x weekly - 1 sets - 10 reps - Standing Hip Abduction with Counter Support  - 1 x daily - 7 x weekly - 1 sets - 10 reps  ASSESSMENT:  CLINICAL IMPRESSION: Ms Gano presents to skilled PT reporting some increased pain today.  Patient able to participate in functional assessments for reassessing progress.  Patient with similar distance on 3 minute walk test and  continues to require a seated recovery period prior  to test ending.  Patient without any loss of balance.  Ended session with dry needling and soft tissue mobilization.  Patient reports feeling looser after stating that she normally feels significantly better after dry needling later in the day.  Patient continues to require skilled PT to progress her activity tolerance to prepare for upcoming procedure in August for nerve stimulator.    OBJECTIVE IMPAIRMENTS: decreased activity tolerance, decreased balance, difficulty walking, decreased ROM, decreased strength, increased muscle spasms, impaired flexibility, postural dysfunction, obesity, and pain.   ACTIVITY LIMITATIONS: carrying, lifting, standing, stairs, bed mobility, dressing, reach over head, hygiene/grooming, and locomotion level  PARTICIPATION LIMITATIONS: meal prep, cleaning, laundry, community activity, and church  PERSONAL FACTORS: Age, Fitness, Past/current experiences, Time since onset of injury/illness/exacerbation, and 3+ comorbidities: CVA x 3, L shoulder OA, obesity are also affecting patient's functional outcome.   REHAB POTENTIAL: Fair due to multiple comorbidities and frequency of visits  CLINICAL DECISION MAKING: Evolving/moderate complexity  EVALUATION COMPLEXITY: Moderate   GOALS: Goals reviewed with patient? Yes  SHORT TERM GOALS: Target date: 02/07/2024   Patient will be independent with initial HEP.  Baseline:  Goal status: MET  2.  Patient will demonstrate improved LE strength by improving 5XSTS by 2-3 seconds.  Baseline: 23.47 Goal status: Met on 02/10/24  3.  Able to complete with RW Baseline: 304 ft with RW Goal status: Met on 02/10/2024   LONG TERM GOALS: Target date: 04/20/2024   Patient will be independent with advanced/ongoing HEP to improve outcomes and carryover.  Baseline:  Goal status: In Progress 03/06/2024  2.  Patient will report 50% improvement in low back pain with ADLs to improve  QOL.  Baseline: 40% (02/14/24) Goal status: In Progress 03/06/2024  3.  Patient will demonstrate improved LE strength by being able climb stairs with 50% less shoulder pain.   Baseline: relies heavily on L UE Goal status: In progress 03/06/2024  4.  Patient will report improved ease of donning her undergarments by 50% Baseline: must do in supine bridge position Goal status: In progress (a little better < 50% better ) 03/06/2024  5.  Patient will score < = 21 on the Modified Oswestry demonstrating improved functional ability.  Baseline: 27 / 50 Goal status: MET 03/06/2024  6.  Patient will demonstrate increased endurance with walking by completing a 5 MWT. Baseline: 2.5 min Goal status: In Progress (see above)    PLAN:  PT FREQUENCY: 2x/week  PT DURATION: 8 weeks  PLANNED INTERVENTIONS: 97164- PT Re-evaluation, 97110-Therapeutic exercises, 97530- Therapeutic activity, [id number]- Neuromuscular re-education, 97535- Self Care, 16109- Manual therapy, [id number]- Gait training, [phone]- Electrical stimulation (unattended), [phone]- Traction (mechanical), Patient/Family education, Stair training, Taping, Dry Needling, Joint mobilization, Spinal mobilization, DME instructions, Cryotherapy, and Moist heat.  PLAN FOR NEXT SESSION: core exercises; walking; leg strengthening ; manual as indicated    Robyne Christen, PT, DPT 04/03/24, 12:52 PM  [facility] [address], [address] [place], [place] [address] Phone # [phone] Fax [phone]

## 2024-04-10 ENCOUNTER — Ambulatory Visit

## 2024-04-10 DIAGNOSIS — M6281 Muscle weakness (generalized): Secondary | ICD-10-CM

## 2024-04-10 DIAGNOSIS — M5459 Other low back pain: Secondary | ICD-10-CM

## 2024-04-10 DIAGNOSIS — R2681 Unsteadiness on feet: Secondary | ICD-10-CM

## 2024-04-10 DIAGNOSIS — R262 Difficulty in walking, not elsewhere classified: Secondary | ICD-10-CM

## 2024-04-10 DIAGNOSIS — R252 Cramp and spasm: Secondary | ICD-10-CM

## 2024-04-10 NOTE — Therapy (Signed)
 OUTPATIENT PHYSICAL THERAPY THORACOLUMBAR TREATMENT    Patient Name: Pamela Cox MRN: [id number] DOB:1941-03-24, 83 y.o., female Today's Date: 04/10/2024  END OF SESSION:  PT End of Session - 04/10/24 1235     Visit Number 16    Date for PT Re-Evaluation 04/20/24    Authorization Type [employer] MCR    Progress Note Due on Visit 19    PT Start Time 1148    PT Stop Time 1231    PT Time Calculation (min) 43 min    Activity Tolerance Patient tolerated treatment well    Behavior During Therapy WFL for tasks assessed/performed                         Past Medical History:  Diagnosis Date   Arthritis    CML (chronic myelocytic leukemia) (HCC)    CVA (cerebral vascular accident) (HCC)    Diabetes mellitus without complication (HCC)    GERD (gastroesophageal reflux disease)    Hypertension    Myelocytic leukemia, chronic (HCC)    Obesity    Sleep apnea    Past Surgical History:  Procedure Laterality Date   APPENDECTOMY     COLONOSCOPY     COLONOSCOPY WITH PROPOFOL  N/A 07/16/2019   Procedure: COLONOSCOPY WITH PROPOFOL ;  Surgeon: Deveron Fly, MD;  Location: [facility] ENDOSCOPY;  Service: Endoscopy;  Laterality: N/A;   ESOPHAGOGASTRODUODENOSCOPY (EGD) WITH PROPOFOL  N/A 07/16/2019   Procedure: ESOPHAGOGASTRODUODENOSCOPY (EGD) WITH PROPOFOL ;  Surgeon: Deveron Fly, MD;  Location: [facility] ENDOSCOPY;  Service: Endoscopy;  Laterality: N/A;   TOTAL SHOULDER REPLACEMENT Right 2021   TUBAL LIGATION     Patient Active Problem List   Diagnosis Date Noted   Allergic rhinitis due to pollen 08/23/2018   DDD (degenerative disc disease), lumbar 08/23/2018   Lumbar neuritis 08/23/2018   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   GERD (gastroesophageal reflux disease) 08/23/2018   Morbid obesity (HCC) 08/23/2018   Obstructive sleep apnea 08/23/2018   Intervertebral disc disorder with radiculopathy of lumbar region 08/23/2018   CML (chronic myelocytic leukemia) (HCC) 08/23/2018    Hx of myocardial perfusion scan 08/23/2018   Osteoarthritis 08/23/2018   Degenerative arthritis of hip 08/23/2018   Lumbar spondylosis 08/23/2018   SOB (shortness of breath) 08/23/2018   History of CVA (cerebrovascular accident) 08/23/2018   Essential hypertension 08/23/2018   Chronic right shoulder pain 08/23/2018   Complete tear of right rotator cuff 08/23/2018   Acute pain of left shoulder 08/23/2018   On antineoplastic chemotherapy 08/23/2018   Hyperlipidemia 08/23/2018   Neurogenic claudication due to lumbar spinal stenosis 08/23/2018   Vitamin D deficiency 08/23/2018    PCP: [facility]   REFERRING PROVIDER: Retta Caster, MD   REFERRING DIAG: [phone] (ICD-10-CM) - Spondylosis of lumbar region without myelopathy or radiculopathy  Low back pain, multifidus muscle training   Rationale for Evaluation and Treatment: Rehabilitation  THERAPY DIAG:  Muscle weakness (generalized)  Difficulty in walking, not elsewhere classified  Other low back pain  Cramp and spasm  Unsteadiness on feet  ONSET DATE: years  SUBJECTIVE:  SUBJECTIVE STATEMENT: "I've been exercising at home; I have to do something"  PERTINENT HISTORY:   R TSA, chroinic myelocytic leukemia, 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 Yes: NPRS scale: 6/10 Pain location: low back Left side worse Pain description: sore and weak Aggravating factors: overhead reaching (putting dishes away) Relieving factors: no  PRECAUTIONS: None  RED FLAGS: None   WEIGHT BEARING RESTRICTIONS: No  FALLS:  Has patient fallen in last 6 months? No  LIVING ENVIRONMENT: Lives with: lives with their family and lives with their daughter Lives in: House/apartment Stairs: 14 steps with rail on left Has  following equipment at home: Otho Blitz - 2 wheeled, Environmental consultant - 4 wheeled, and Wheelchair (manual)  OCCUPATION: retired  PLOF: Independent  PATIENT GOALS: get moving better, decrease soreness, hard to get undergarments on, getting around in bed  NEXT MD VISIT: after PT  OBJECTIVE:  Note: Objective measures were completed at Evaluation unless otherwise noted.  DIAGNOSTIC FINDINGS:  XR 2023: Findings/Impression:   Segmentation: There are five non-rib-bearing lumbar-type vertebral bodies.  Sclerotic degenerative changes along the L5 inferior and S1 superior  endplate.  Bones: Generalized osteopenia. No displaced fracture or loss of vertebral  body height.  Limited evaluation of the sacrum due to overlying bowel gas.  Alignment: Slight lumbar dextrocurvature. Unchanged slight grade 1  anterolisthesis of L3 on L4 and L4 on L5.  Degenerative changes: Interval progression of severe multilevel lumbar  spondylosis from 2021. Mild bilateral hip and bilateral SI joint  degenerative changes. Pubic symphysis appears maintained.   PATIENT SURVEYS:  Eval:  Modified Oswestry  27 / 50 = 54.0 %  03/06/2024 : ODI- 20/50 40% 04/03/24:  Modified Oswestry Low Back Pain Disability Questionnaire: 25 / 50 = 50.0 %  COGNITION: Overall cognitive status: Within functional limits for tasks assessed     SENSATION: WFL  MUSCLE LENGTH: B HS WNL, R piriformis mild tightness  POSTURE: flexed trunk   PALPATION: Palpation: TTP at gluteals  LUMBAR ROM:   AROM eval  Flexion full  Extension Painful limited 80%  Right lateral flexion Limited and LOB   Left lateral flexion   Right rotation Limited 60%  Left rotation Limited 50%   (Blank rows = not tested)  LOWER EXTREMITY ROM:   WFL for tasks assessed   LOWER EXTREMITY MMT:    MMT Right eval Left eval  Hip flexion 4- 4  Hip extension    Hip abduction    Hip adduction    Hip internal rotation    Hip external rotation    Knee flexion 5 5  Knee  extension 5 5  Ankle dorsiflexion 5 5  Ankle plantarflexion    Ankle inversion    Ankle eversion     (Blank rows = not tested)   FUNCTIONAL TESTS:  Eval: 5 times sit to stand: 23.47 with B UE assist 3 minute walk test: 2.5 min 268 ft  02/10/2024: 3 minute walk test: 304 ft with RW (cuing for keeping walker closer to body to allow for improved upright posture) 5 times sit to stand: 21.16 sec with use of bilat UE  02/28/2024 : 321ft with RW (one minute seated rest beak after 1 minute)  03/06/2024 5 STS:20.15 sec with UE support  04/03/2024: 5 times sit to stand:  22.50 sec with UE support Timed up and Go (TUG):  19.63 sec with RW 3 minute walk test:  304 ft with RW (one brief seated recovery  period)    TREATMENT DATE:    04/10/2024: Nustep level 5 x6 min with PT present to discuss status Sit to/from stand x10 Seated with 2.5#:  long arc quad, step up and over yardstick on floor, hip ER.  2x10 each bilat Standing: shoulder to hip diagonals with blue weighted ball 2x10 each  Press into foam roll in sitting for TA activation 2x10 Trunk extension seated with teal loop 2x10 Gait with walker: around building 1 lap- verbal cues for alignment and walker use 2.5 minutes and fatigue with Rt LE instability at the end    5/132025: Nustep level 5 x6 min with PT present to discuss status Sit to/from stand x5 3 minute walk for 304 ft with RW Seated with 2.5#:  long arc quad, marching, hip ER.  2x10 each bilat Trigger Point Dry Needling Subsequent Treatment: Instructions provided previously at initial dry needling treatment.  Patient Verbal Consent Given: Yes Education Handout Provided: Previously Provided Muscles Treated: bilateral lumbar multifidi Electrical Stimulation Performed: No Treatment Response/Outcome: Utilized skilled palpation to identify bony landmarks and trigger points.  Able to illicit twitch response and muscle elongation.  Soft tissue mobilization following to  further promote tissue elongation.     03/27/2024: Nustep level 2 6 mins PT present to discuss status Seated LAQ 2.5# AW 2 x 10 bilateral  Seated hip flexion 2.5# AW 2 x 10 bilateral  Seated abduction with red loop x 20 Seated hip adduction with ball x 20- 5 second hold 6 in step taps with UE support x 10 bilateral  Standing: shoulder to hip diagonals with blue weighted ball 2x10 each  Blue stability ball roll outs x 10 5 sec holds Manual: Addaday to bilateral lumbar paraspinals for improved circulation and tissue mobility- seated and resting arms at barre   PATIENT EDUCATION:  Education details: PT eval findings, anticipated POC, and initial HEP , [email or web address] Person educated: Patient Education method: Explanation, Demonstration, and Handouts Education comprehension: verbalized understanding and returned demonstration  HOME EXERCISE PROGRAM: Access Code: [email or web address] URL: [email or web address] Date: 03/06/2024 Prepared by: Penelope Bowie  Exercises - Supine Bridge  - 2 x daily - 7 x weekly - 1-3 sets - 10 reps - Sit to Stand with Counter Support  - 2 x daily - 3 x weekly - 1 sets - 5 reps - Seated Long Arc Quad  - 3 x daily - 7 x weekly - 2 sets - 10 reps - 5 hold - Seated March   - 3 x daily - 7 x weekly - 3 sets - 10 reps - Seated Heel Toe Raises   - 3 x daily - 7 x weekly - 2 sets - 10 reps - Seated Isometric Hip Adduction with Ball  - 3 x daily - 7 x weekly - 2 sets - 10 reps - Standing March with Counter Support  - 1 x daily - 7 x weekly - 2 sets - 10 reps - Standing Heel Raise with Support  - 1 x daily - 7 x weekly - 1 sets - 10 reps - Standing Hip Abduction with Counter Support  - 1 x daily - 7 x weekly - 1 sets - 10 reps  ASSESSMENT:  CLINICAL IMPRESSION: Pt continues to be challenged by current level of activity at home and in the clinic.  She reports that she is appropriately sore and fatigued after each session. She was challenged with walk around the building  with walker.  Patient reports feeling looser after manual therapy.  PT monitored throughout session for safety, pain, fatigue and technique.  Patient continues to require skilled PT to progress her activity tolerance to prepare for upcoming procedure in August for nerve stimulator.  OBJECTIVE IMPAIRMENTS: decreased activity tolerance, decreased balance, difficulty walking, decreased ROM, decreased strength, increased muscle spasms, impaired flexibility, postural dysfunction, obesity, and pain.   ACTIVITY LIMITATIONS: carrying, lifting, standing, stairs, bed mobility, dressing, reach over head, hygiene/grooming, and locomotion level  PARTICIPATION LIMITATIONS: meal prep, cleaning, laundry, community activity, and church  PERSONAL FACTORS: Age, Fitness, Past/current experiences, Time since onset of injury/illness/exacerbation, and 3+ comorbidities: CVA x 3, L shoulder OA, obesity are also affecting patient's functional outcome.   REHAB POTENTIAL: Fair due to multiple comorbidities and frequency of visits  CLINICAL DECISION MAKING: Evolving/moderate complexity  EVALUATION COMPLEXITY: Moderate   GOALS: Goals reviewed with patient? Yes  SHORT TERM GOALS: Target date: 02/07/2024   Patient will be independent with initial HEP.  Baseline:  Goal status: MET  2.  Patient will demonstrate improved LE strength by improving 5XSTS by 2-3 seconds.  Baseline: 23.47 Goal status: Met on 02/10/24  3.  Able to complete with RW Baseline: 304 ft with RW Goal status: Met on 02/10/2024   LONG TERM GOALS: Target date: 04/20/2024   Patient will be independent with advanced/ongoing HEP to improve outcomes and carryover.  Baseline:  Goal status: In Progress 03/06/2024  2.  Patient will report 50% improvement in low back pain with ADLs to improve QOL.  Baseline: 40% (02/14/24) Goal status: In Progress 03/06/2024  3.  Patient will demonstrate improved LE strength by being able climb stairs with 50% less  shoulder pain.   Baseline: relies heavily on L UE Goal status: In progress 03/06/2024  4.  Patient will report improved ease of donning her undergarments by 50% Baseline: must do in supine bridge position Goal status: In progress (a little better < 50% better ) 03/06/2024  5.  Patient will score < = 21 on the Modified Oswestry demonstrating improved functional ability.  Baseline: 27 / 50 Goal status: MET 03/06/2024  6.  Patient will demonstrate increased endurance with walking by completing a 5 MWT. Baseline: 3 min (04/10/24) Goal status: In Progress (see above)    PLAN:  PT FREQUENCY: 2x/week  PT DURATION: 8 weeks  PLANNED INTERVENTIONS: 97164- PT Re-evaluation, 97110-Therapeutic exercises, 97530- Therapeutic activity, [id number]- Neuromuscular re-education, 97535- Self Care, 84132- Manual therapy, [id number]- Gait training, [phone]- Electrical stimulation (unattended), [phone]- Traction (mechanical), Patient/Family education, Stair training, Taping, Dry Needling, Joint mobilization, Spinal mobilization, DME instructions, Cryotherapy, and Moist heat.  PLAN FOR NEXT SESSION: core exercises; walking; leg strengthening ; manual as indicated    Luella Sager, PT 04/10/24 12:37 PM   [facility] [address] [place], [place] [address] Phone # [phone] Fax [phone]

## 2024-04-19 ENCOUNTER — Ambulatory Visit

## 2024-04-19 DIAGNOSIS — M6281 Muscle weakness (generalized): Secondary | ICD-10-CM

## 2024-04-19 DIAGNOSIS — R252 Cramp and spasm: Secondary | ICD-10-CM

## 2024-04-19 DIAGNOSIS — R2681 Unsteadiness on feet: Secondary | ICD-10-CM

## 2024-04-19 DIAGNOSIS — R262 Difficulty in walking, not elsewhere classified: Secondary | ICD-10-CM

## 2024-04-19 DIAGNOSIS — M5459 Other low back pain: Secondary | ICD-10-CM

## 2024-04-19 NOTE — Therapy (Addendum)
 OUTPATIENT PHYSICAL THERAPY THORACOLUMBAR TREATMENT    Patient Name: Pamela Cox MRN: [id number] DOB:Dec 10, 1940, 83 y.o., female Today's Date: 04/19/2024  END OF SESSION:  PT End of Session - 04/19/24 1233     Visit Number 17    Date for PT Re-Evaluation 06/01/24    Authorization Type [employer] MCR    Progress Note Due on Visit 19    PT Start Time 1148    PT Stop Time 1232    PT Time Calculation (min) 44 min    Behavior During Therapy WFL for tasks assessed/performed                          Past Medical History:  Diagnosis Date   Arthritis    CML (chronic myelocytic leukemia) (HCC)    CVA (cerebral vascular accident) (HCC)    Diabetes mellitus without complication (HCC)    GERD (gastroesophageal reflux disease)    Hypertension    Myelocytic leukemia, chronic (HCC)    Obesity    Sleep apnea    Past Surgical History:  Procedure Laterality Date   APPENDECTOMY     COLONOSCOPY     COLONOSCOPY WITH PROPOFOL  N/A 07/16/2019   Procedure: COLONOSCOPY WITH PROPOFOL ;  Surgeon: Deveron Fly, MD;  Location: [facility] ENDOSCOPY;  Service: Endoscopy;  Laterality: N/A;   ESOPHAGOGASTRODUODENOSCOPY (EGD) WITH PROPOFOL  N/A 07/16/2019   Procedure: ESOPHAGOGASTRODUODENOSCOPY (EGD) WITH PROPOFOL ;  Surgeon: Deveron Fly, MD;  Location: [facility] ENDOSCOPY;  Service: Endoscopy;  Laterality: N/A;   TOTAL SHOULDER REPLACEMENT Right 2021   TUBAL LIGATION     Patient Active Problem List   Diagnosis Date Noted   Allergic rhinitis due to pollen 08/23/2018   DDD (degenerative disc disease), lumbar 08/23/2018   Lumbar neuritis 08/23/2018   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   GERD (gastroesophageal reflux disease) 08/23/2018   Morbid obesity (HCC) 08/23/2018   Obstructive sleep apnea 08/23/2018   Intervertebral disc disorder with radiculopathy of lumbar region 08/23/2018   CML (chronic myelocytic leukemia) (HCC) 08/23/2018   Hx of myocardial perfusion scan 08/23/2018    Osteoarthritis 08/23/2018   Degenerative arthritis of hip 08/23/2018   Lumbar spondylosis 08/23/2018   SOB (shortness of breath) 08/23/2018   History of CVA (cerebrovascular accident) 08/23/2018   Essential hypertension 08/23/2018   Chronic right shoulder pain 08/23/2018   Complete tear of right rotator cuff 08/23/2018   Acute pain of left shoulder 08/23/2018   On antineoplastic chemotherapy 08/23/2018   Hyperlipidemia 08/23/2018   Neurogenic claudication due to lumbar spinal stenosis 08/23/2018   Vitamin D deficiency 08/23/2018    PCP: [facility]   REFERRING PROVIDER: Retta Caster, MD   REFERRING DIAG: [phone] (ICD-10-CM) - Spondylosis of lumbar region without myelopathy or radiculopathy  Low back pain, multifidus muscle training   Rationale for Evaluation and Treatment: Rehabilitation  THERAPY DIAG:  Muscle weakness (generalized) - Plan: PT plan of care cert/re-cert  Difficulty in walking, not elsewhere classified - Plan: PT plan of care cert/re-cert  Other low back pain - Plan: PT plan of care cert/re-cert  Cramp and spasm - Plan: PT plan of care cert/re-cert  Unsteadiness on feet - Plan: PT plan of care cert/re-cert  ONSET DATE: years  SUBJECTIVE:  SUBJECTIVE STATEMENT:I've been stretching at home.  I think this is helping me some to get me stronger before my surgery.   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 Yes: NPRS scale: 6/10 Pain location: low back Left side worse Pain description: sore and weak Aggravating factors: overhead reaching (putting dishes away) Relieving factors: no  PRECAUTIONS: None  RED FLAGS: None   WEIGHT BEARING RESTRICTIONS: No  FALLS:  Has patient  fallen in last 6 months? No  LIVING ENVIRONMENT: Lives with: lives with their family and lives with their daughter Lives in: House/apartment Stairs: 14 steps with rail on left Has following equipment at home: Otho Blitz - 2 wheeled, Environmental consultant - 4 wheeled, and Wheelchair (manual)  OCCUPATION: retired  PLOF: Independent  PATIENT GOALS: get moving better, decrease soreness, hard to get undergarments on, getting around in bed  NEXT MD VISIT: after PT  OBJECTIVE:  Note: Objective measures were completed at Evaluation unless otherwise noted.  DIAGNOSTIC FINDINGS:  XR 2023: Findings/Impression:   Segmentation: There are five non-rib-bearing lumbar-type vertebral bodies.  Sclerotic degenerative changes along the L5 inferior and S1 superior  endplate.  Bones: Generalized osteopenia. No displaced fracture or loss of vertebral  body height.  Limited evaluation of the sacrum due to overlying bowel gas.  Alignment: Slight lumbar dextrocurvature. Unchanged slight grade 1  anterolisthesis of L3 on L4 and L4 on L5.  Degenerative changes: Interval progression of severe multilevel lumbar  spondylosis from 2021. Mild bilateral hip and bilateral SI joint  degenerative changes. Pubic symphysis appears maintained.   PATIENT SURVEYS:  Eval:  Modified Oswestry  27 / 50 = 54.0 %  03/06/2024 : ODI- 20/50 40% 04/03/24:  Modified Oswestry Low Back Pain Disability Questionnaire: 25 / 50 = 50.0 %   COGNITION: Overall cognitive status: Within functional limits for tasks assessed     SENSATION: WFL  MUSCLE LENGTH: B HS WNL, R piriformis mild tightness  POSTURE: flexed trunk   PALPATION: Palpation: TTP at gluteals  LUMBAR ROM:   AROM eval  Flexion full  Extension Painful limited 80%  Right lateral flexion Limited and LOB   Left lateral flexion   Right rotation Limited 60%  Left rotation Limited 50%   (Blank rows = not tested)  LOWER EXTREMITY ROM:   WFL for tasks assessed   LOWER EXTREMITY  MMT:    MMT Right eval Left eval  Hip flexion 4- 4  Hip extension    Hip abduction    Hip adduction    Hip internal rotation    Hip external rotation    Knee flexion 5 5  Knee extension 5 5  Ankle dorsiflexion 5 5  Ankle plantarflexion    Ankle inversion    Ankle eversion     (Blank rows = not tested)   FUNCTIONAL TESTS:  Eval: 5 times sit to stand: 23.47 with B UE assist 3 minute walk test: 2.5 min 268 ft  02/10/2024: 3 minute walk test: 304 ft with RW (cuing for keeping walker closer to body to allow for improved upright posture) 5 times sit to stand: 21.16 sec with use of bilat UE  02/28/2024 : 327ft with RW (one minute seated rest beak after 1 minute)  03/06/2024 5 STS:20.15 sec with UE support  04/03/2024: 5 times sit to stand:  22.50 sec with UE support Timed up and Go (TUG):  19.63 sec with RW 3 minute  walk test:  304 ft with RW (one brief seated recovery period)       TREATMENT DATE:    04/19/2024: Nustep level 5 x6 min with PT present to discuss status Sit to/from stand x10 Seated with 2.5#:  long arc quad, step up and over yardstick on floor, hip ER.  2x10 each bilat Standing: shoulder to hip diagonals with yellow weighted ball 2x10 each  Press into foam roll in sitting for TA activation 2x10 Trunk extension seated with teal loop 2x10    04/10/2024: Nustep level 5 x6 min with PT present to discuss status Sit to/from stand x10 Seated with 2.5#:  long arc quad, step up and over yardstick on floor, hip ER.  2x10 each bilat Standing: shoulder to hip diagonals with yellow weighted ball 2x10 each  Press into foam roll in sitting for TA activation 2x10 Trunk extension seated with teal loop 2x10   5/132025: Nustep level 5 x6 min with PT present to discuss status Sit to/from stand x5 3 minute walk for 304 ft with RW Seated with 2.5#:  long arc quad, marching, hip ER.  2x10 each bilat Trigger Point Dry Needling Subsequent Treatment: Instructions  provided previously at initial dry needling treatment.  Patient Verbal Consent Given: Yes Education Handout Provided: Previously Provided Muscles Treated: bilateral lumbar multifidi Electrical Stimulation Performed: No Treatment Response/Outcome: Utilized skilled palpation to identify bony landmarks and trigger points.  Able to illicit twitch response and muscle elongation.  Soft tissue mobilization following to further promote tissue elongation.     03/27/2024: Nustep level 2 6 mins PT present to discuss status Seated LAQ 2.5# AW 2 x 10 bilateral  Seated hip flexion 2.5# AW 2 x 10 bilateral  Seated abduction with red loop x 20 Seated hip adduction with ball x 20- 5 second hold 6 in step taps with UE support x 10 bilateral  Standing: shoulder to hip diagonals with blue weighted ball 2x10 each  Blue stability ball roll outs x 10 5 sec holds Manual: Addaday to bilateral lumbar paraspinals for improved circulation and tissue mobility- seated and resting arms at barre   PATIENT EDUCATION:  Education details: PT eval findings, anticipated POC, and initial HEP , [email or web address] Person educated: Patient Education method: Explanation, Demonstration, and Handouts Education comprehension: verbalized understanding and returned demonstration  HOME EXERCISE PROGRAM: Access Code: [email or web address] URL: [email or web address] Date: 03/06/2024 Prepared by: Penelope Bowie  Exercises - Supine Bridge  - 2 x daily - 7 x weekly - 1-3 sets - 10 reps - Sit to Stand with Counter Support  - 2 x daily - 3 x weekly - 1 sets - 5 reps - Seated Long Arc Quad  - 3 x daily - 7 x weekly - 2 sets - 10 reps - 5 hold - Seated March   - 3 x daily - 7 x weekly - 3 sets - 10 reps - Seated Heel Toe Raises   - 3 x daily - 7 x weekly - 2 sets - 10 reps - Seated Isometric Hip Adduction with Ball  - 3 x daily - 7 x weekly - 2 sets - 10 reps - Standing March with Counter Support  - 1 x daily - 7 x weekly - 2 sets - 10 reps -  Standing Heel Raise with Support  - 1 x daily - 7 x weekly - 1 sets - 10 reps - Standing Hip Abduction with Counter Support  - 1 x daily - 7 x weekly - 1  sets - 10 reps  ASSESSMENT:  CLINICAL IMPRESSION: Pt continues to be challenged by current level of activity at home and in the clinic.  She reports that she is appropriately sore and fatigued after each session. Pt required longer rest breaks today.  Pt with chronic pain and mobility deficits and progress has been slow due to this.  PT monitored throughout session for safety, pain, fatigue and technique. She walked 2.5 minutes last session and required rest after due to LE fatigue and instability.  Patient continues to require skilled PT to progress her activity tolerance to prepare for upcoming procedure in August for nerve stimulator.  OBJECTIVE IMPAIRMENTS: decreased activity tolerance, decreased balance, difficulty walking, decreased ROM, decreased strength, increased muscle spasms, impaired flexibility, postural dysfunction, obesity, and pain.   ACTIVITY LIMITATIONS: carrying, lifting, standing, stairs, bed mobility, dressing, reach over head, hygiene/grooming, and locomotion level  PARTICIPATION LIMITATIONS: meal prep, cleaning, laundry, community activity, and church  PERSONAL FACTORS: Age, Fitness, Past/current experiences, Time since onset of injury/illness/exacerbation, and 3+ comorbidities: CVA x 3, L shoulder OA, obesity are also affecting patient's functional outcome.   REHAB POTENTIAL: Fair due to multiple comorbidities and frequency of visits  CLINICAL DECISION MAKING: Evolving/moderate complexity  EVALUATION COMPLEXITY: Moderate   GOALS: Goals reviewed with patient? Yes  SHORT TERM GOALS: Target date: 02/07/2024   Patient will be independent with initial HEP.  Baseline:  Goal status: MET  2.  Patient will demonstrate improved LE strength by improving 5XSTS by 2-3 seconds.  Baseline: 23.47 Goal status: Met on  02/10/24  3.  Able to complete with RW Baseline: 304 ft with RW Goal status: Met on 02/10/2024   LONG TERM GOALS: Target date: 06/01/24   Patient will be independent with advanced/ongoing HEP to improve outcomes and carryover.  Baseline:  Goal status: In Progress 04/19/24  2.  Patient will report 50% improvement in low back pain with ADLs to improve QOL.  Baseline: 40% (04/19/24) Goal status: In Progress   3.  Patient will demonstrate improved LE strength by being able climb stairs with 50% less shoulder pain.   Baseline: relies heavily on Lt UE due to significant LE fatigue (04/19/24) Goal status: In progress   4.  Patient will report improved ease of donning her undergarments by 50% Baseline: must do in supine bridge position Goal status: In progress (a little better < 50% better ) 03/06/2024  5.  Patient will score < = 21 on the Modified Oswestry demonstrating improved functional ability.  Baseline: 27 / 50 Goal status: MET 03/06/2024  6.  Patient will demonstrate increased endurance with walking by completing a 4 MWT. Baseline: 2.5-3 min (04/19/24) Goal status: In Progress (see above)    PLAN:  PT FREQUENCY: 1-2x/week  PT DURATION: 8 weeks  PLANNED INTERVENTIONS: 97164- PT Re-evaluation, 97110-Therapeutic exercises, 97530- Therapeutic activity, [id number]- Neuromuscular re-education, 97535- Self Care, 45409- Manual therapy, [id number]- Gait training, [phone]- Electrical stimulation (unattended), [phone]- Traction (mechanical), Patient/Family education, Stair training, Taping, Dry Needling, Joint mobilization, Spinal mobilization, DME instructions, Cryotherapy, and Moist heat.  PLAN FOR NEXT SESSION: core exercises; walking; leg strengthening ; manual as indicated    Luella Sager, PT 04/19/24 12:37 PM   [facility] [address] [place], [place] [address] Phone # [phone] Fax [phone]

## 2024-04-24 ENCOUNTER — Ambulatory Visit: Attending: Anesthesiology

## 2024-04-24 DIAGNOSIS — M5459 Other low back pain: Secondary | ICD-10-CM | POA: Diagnosis present

## 2024-04-24 DIAGNOSIS — R2681 Unsteadiness on feet: Secondary | ICD-10-CM | POA: Insufficient documentation

## 2024-04-24 DIAGNOSIS — R262 Difficulty in walking, not elsewhere classified: Secondary | ICD-10-CM | POA: Insufficient documentation

## 2024-04-24 DIAGNOSIS — R252 Cramp and spasm: Secondary | ICD-10-CM | POA: Diagnosis present

## 2024-04-24 DIAGNOSIS — M6281 Muscle weakness (generalized): Secondary | ICD-10-CM | POA: Diagnosis present

## 2024-04-24 NOTE — Therapy (Signed)
 OUTPATIENT PHYSICAL THERAPY THORACOLUMBAR TREATMENT    Patient Name: Pamela Cox MRN: [id number] DOB:Feb 22, 1941, 83 y.o., female Today's Date: 04/24/2024  END OF SESSION:  PT End of Session - 04/24/24 1227     Visit Number 18    Date for PT Re-Evaluation 06/01/24    Authorization Type [employer] MCR    Progress Note Due on Visit 19    PT Start Time 1146    PT Stop Time 1227    PT Time Calculation (min) 41 min    Activity Tolerance Patient tolerated treatment well    Behavior During Therapy WFL for tasks assessed/performed                           Past Medical History:  Diagnosis Date   Arthritis    CML (chronic myelocytic leukemia) (HCC)    CVA (cerebral vascular accident) (HCC)    Diabetes mellitus without complication (HCC)    GERD (gastroesophageal reflux disease)    Hypertension    Myelocytic leukemia, chronic (HCC)    Obesity    Sleep apnea    Past Surgical History:  Procedure Laterality Date   APPENDECTOMY     COLONOSCOPY     COLONOSCOPY WITH PROPOFOL  N/A 07/16/2019   Procedure: COLONOSCOPY WITH PROPOFOL ;  Surgeon: Deveron Fly, MD;  Location: [facility] ENDOSCOPY;  Service: Endoscopy;  Laterality: N/A;   ESOPHAGOGASTRODUODENOSCOPY (EGD) WITH PROPOFOL  N/A 07/16/2019   Procedure: ESOPHAGOGASTRODUODENOSCOPY (EGD) WITH PROPOFOL ;  Surgeon: Deveron Fly, MD;  Location: [facility] ENDOSCOPY;  Service: Endoscopy;  Laterality: N/A;   TOTAL SHOULDER REPLACEMENT Right 2021   TUBAL LIGATION     Patient Active Problem List   Diagnosis Date Noted   Allergic rhinitis due to pollen 08/23/2018   DDD (degenerative disc disease), lumbar 08/23/2018   Lumbar neuritis 08/23/2018   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   GERD (gastroesophageal reflux disease) 08/23/2018   Morbid obesity (HCC) 08/23/2018   Obstructive sleep apnea 08/23/2018   Intervertebral disc disorder with radiculopathy of lumbar region 08/23/2018   CML (chronic myelocytic leukemia) (HCC) 08/23/2018    Hx of myocardial perfusion scan 08/23/2018   Osteoarthritis 08/23/2018   Degenerative arthritis of hip 08/23/2018   Lumbar spondylosis 08/23/2018   SOB (shortness of breath) 08/23/2018   History of CVA (cerebrovascular accident) 08/23/2018   Essential hypertension 08/23/2018   Chronic right shoulder pain 08/23/2018   Complete tear of right rotator cuff 08/23/2018   Acute pain of left shoulder 08/23/2018   On antineoplastic chemotherapy 08/23/2018   Hyperlipidemia 08/23/2018   Neurogenic claudication due to lumbar spinal stenosis 08/23/2018   Vitamin D deficiency 08/23/2018    PCP: [facility]   REFERRING PROVIDER: Retta Caster, MD   REFERRING DIAG: [phone] (ICD-10-CM) - Spondylosis of lumbar region without myelopathy or radiculopathy  Low back pain, multifidus muscle training   Rationale for Evaluation and Treatment: Rehabilitation  THERAPY DIAG:  Muscle weakness (generalized)  Difficulty in walking, not elsewhere classified  Other low back pain  Cramp and spasm  Unsteadiness on feet  ONSET DATE: years  SUBJECTIVE:  SUBJECTIVE STATEMENT: I feel like I've been working after each visit but it isn't too much.  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 Yes: NPRS scale: 5/10 Pain location: low back Left side worse Pain description: sore and weak Aggravating factors: overhead reaching (putting dishes away) Relieving factors: no  PRECAUTIONS: None  RED FLAGS: None   WEIGHT BEARING RESTRICTIONS: No  FALLS:  Has patient fallen in last 6 months? No  LIVING ENVIRONMENT: Lives with: lives with their family and lives with their daughter Lives in: House/apartment Stairs: 14 steps with  rail on left Has following equipment at home: Otho Blitz - 2 wheeled, Environmental consultant - 4 wheeled, and Wheelchair (manual)  OCCUPATION: retired  PLOF: Independent  PATIENT GOALS: get moving better, decrease soreness, hard to get undergarments on, getting around in bed  NEXT MD VISIT: after PT  OBJECTIVE:  Note: Objective measures were completed at Evaluation unless otherwise noted.  DIAGNOSTIC FINDINGS:  XR 2023: Findings/Impression:   Segmentation: There are five non-rib-bearing lumbar-type vertebral bodies.  Sclerotic degenerative changes along the L5 inferior and S1 superior  endplate.  Bones: Generalized osteopenia. No displaced fracture or loss of vertebral  body height.  Limited evaluation of the sacrum due to overlying bowel gas.  Alignment: Slight lumbar dextrocurvature. Unchanged slight grade 1  anterolisthesis of L3 on L4 and L4 on L5.  Degenerative changes: Interval progression of severe multilevel lumbar  spondylosis from 2021. Mild bilateral hip and bilateral SI joint  degenerative changes. Pubic symphysis appears maintained.   PATIENT SURVEYS:  Eval:  Modified Oswestry  27 / 50 = 54.0 %  03/06/2024 : ODI- 20/50 40% 04/03/24:  Modified Oswestry Low Back Pain Disability Questionnaire: 25 / 50 = 50.0 %   COGNITION: Overall cognitive status: Within functional limits for tasks assessed     SENSATION: WFL  MUSCLE LENGTH: B HS WNL, R piriformis mild tightness  POSTURE: flexed trunk   PALPATION: Palpation: TTP at gluteals  LUMBAR ROM:   AROM eval  Flexion full  Extension Painful limited 80%  Right lateral flexion Limited and LOB   Left lateral flexion   Right rotation Limited 60%  Left rotation Limited 50%   (Blank rows = not tested)  LOWER EXTREMITY ROM:   WFL for tasks assessed   LOWER EXTREMITY MMT:    MMT Right eval Left eval  Hip flexion 4- 4  Hip extension    Hip abduction    Hip adduction    Hip internal rotation    Hip external rotation    Knee  flexion 5 5  Knee extension 5 5  Ankle dorsiflexion 5 5  Ankle plantarflexion    Ankle inversion    Ankle eversion     (Blank rows = not tested)   FUNCTIONAL TESTS:  Eval: 5 times sit to stand: 23.47 with B UE assist 3 minute walk test: 2.5 min 268 ft  02/10/2024: 3 minute walk test: 304 ft with RW (cuing for keeping walker closer to body to allow for improved upright posture) 5 times sit to stand: 21.16 sec with use of bilat UE  02/28/2024 : 379ft with RW (one minute seated rest beak after 1 minute)  03/06/2024 5 STS:20.15 sec with UE support  04/03/2024: 5 times sit to stand:  22.50 sec with UE support Timed up and Go (TUG):  19.63 sec with RW 3 minute walk test:  304 ft with  RW (one brief seated recovery period)       TREATMENT DATE:   04/24/2024: Nustep level 5 x7 min with PT present to discuss status Sit to/from stand x10 Walk down hall and back 2x1 min- verbal cues for alignment and control with stand to sit Seated with 2.5#:  long arc quad, step up and over yardstick on floor, hip ER.  2x10 each bilat Standing: shoulder to hip diagonals with yellow weighted ball 2x10 each  Standing rows green band 2x10 Trunk extension seated with pink loop 2x10  04/19/2024: Nustep level 5 x6 min with PT present to discuss status Sit to/from stand x10 Seated with 2.5#:  long arc quad, step up and over yardstick on floor, hip ER.  2x10 each bilat Standing: shoulder to hip diagonals with yellow weighted ball 2x10 each  Press into foam roll in sitting for TA activation 2x10 Trunk extension seated with teal loop 2x10   04/10/2024: Nustep level 5 x6 min with PT present to discuss status Sit to/from stand x10 Seated with 2.5#:  long arc quad, step up and over yardstick on floor, hip ER.  2x10 each bilat Standing: shoulder to hip diagonals with yellow weighted ball 2x10 each  Press into foam roll in sitting for TA activation 2x10 Trunk extension seated with teal loop  2x10   PATIENT EDUCATION:  Education details: PT eval findings, anticipated POC, and initial HEP , [email or web address] Person educated: Patient Education method: Explanation, Demonstration, and Handouts Education comprehension: verbalized understanding and returned demonstration  HOME EXERCISE PROGRAM: Access Code: [email or web address] URL: [email or web address] Date: 03/06/2024 Prepared by: Penelope Bowie  Exercises - Supine Bridge  - 2 x daily - 7 x weekly - 1-3 sets - 10 reps - Sit to Stand with Counter Support  - 2 x daily - 3 x weekly - 1 sets - 5 reps - Seated Long Arc Quad  - 3 x daily - 7 x weekly - 2 sets - 10 reps - 5 hold - Seated March   - 3 x daily - 7 x weekly - 3 sets - 10 reps - Seated Heel Toe Raises   - 3 x daily - 7 x weekly - 2 sets - 10 reps - Seated Isometric Hip Adduction with Ball  - 3 x daily - 7 x weekly - 2 sets - 10 reps - Standing March with Counter Support  - 1 x daily - 7 x weekly - 2 sets - 10 reps - Standing Heel Raise with Support  - 1 x daily - 7 x weekly - 1 sets - 10 reps - Standing Hip Abduction with Counter Support  - 1 x daily - 7 x weekly - 1 sets - 10 reps  ASSESSMENT:  CLINICAL IMPRESSION: Pt continues to be challenged by current level of activity at home and in the clinic.  She reports that she is appropriately sore and fatigued after each session. She did well with increased time on Nustep and increased resistance with seated trunk extension. Pt required less rest today and PT provided verbal cues for control with stand to sit transition.  Patient continues to require skilled PT to progress her activity tolerance to prepare for upcoming procedure in August for nerve stimulator.  OBJECTIVE IMPAIRMENTS: decreased activity tolerance, decreased balance, difficulty walking, decreased ROM, decreased strength, increased muscle spasms, impaired flexibility, postural dysfunction, obesity, and pain.   ACTIVITY LIMITATIONS: carrying, lifting, standing, stairs,  bed mobility, dressing, reach over head, hygiene/grooming, and locomotion level  PARTICIPATION LIMITATIONS:  meal prep, cleaning, laundry, community activity, and church  PERSONAL FACTORS: Age, Fitness, Past/current experiences, Time since onset of injury/illness/exacerbation, and 3+ comorbidities: CVA x 3, L shoulder OA, obesity are also affecting patient's functional outcome.   REHAB POTENTIAL: Fair due to multiple comorbidities and frequency of visits  CLINICAL DECISION MAKING: Evolving/moderate complexity  EVALUATION COMPLEXITY: Moderate   GOALS: Goals reviewed with patient? Yes  SHORT TERM GOALS: Target date: 02/07/2024   Patient will be independent with initial HEP.  Baseline:  Goal status: MET  2.  Patient will demonstrate improved LE strength by improving 5XSTS by 2-3 seconds.  Baseline: 23.47 Goal status: Met on 02/10/24  3.  Able to complete with RW Baseline: 304 ft with RW Goal status: Met on 02/10/2024   LONG TERM GOALS: Target date: 06/01/24   Patient will be independent with advanced/ongoing HEP to improve outcomes and carryover.  Baseline:  Goal status: In Progress 04/19/24  2.  Patient will report 50% improvement in low back pain with ADLs to improve QOL.  Baseline: 40% (04/19/24) Goal status: In Progress   3.  Patient will demonstrate improved LE strength by being able climb stairs with 50% less shoulder pain.   Baseline: relies heavily on Lt UE due to significant LE fatigue (04/19/24) Goal status: In progress   4.  Patient will report improved ease of donning her undergarments by 50% Baseline: must do in supine bridge position Goal status: In progress (a little better < 50% better ) 03/06/2024  5.  Patient will score < = 21 on the Modified Oswestry demonstrating improved functional ability.  Baseline: 27 / 50 Goal status: MET 03/06/2024  6.  Patient will demonstrate increased endurance with walking by completing a 4 MWT. Baseline: 2.5-3 min  (04/19/24) Goal status: In Progress (see above)    PLAN:  PT FREQUENCY: 1-2x/week  PT DURATION: 8 weeks  PLANNED INTERVENTIONS: 97164- PT Re-evaluation, 97110-Therapeutic exercises, 97530- Therapeutic activity, [id number]- Neuromuscular re-education, 97535- Self Care, 16109- Manual therapy, [id number]- Gait training, [phone]- Electrical stimulation (unattended), [phone]- Traction (mechanical), Patient/Family education, Stair training, Taping, Dry Needling, Joint mobilization, Spinal mobilization, DME instructions, Cryotherapy, and Moist heat.  PLAN FOR NEXT SESSION: core exercises; walking; leg strengthening ; manual as indicated    Luella Sager, PT 04/24/24 12:28 PM   [facility] [address] [place], [place] [address] Phone # [phone] Fax [phone]

## 2024-05-01 ENCOUNTER — Ambulatory Visit

## 2024-05-09 ENCOUNTER — Ambulatory Visit

## 2024-05-09 DIAGNOSIS — R262 Difficulty in walking, not elsewhere classified: Secondary | ICD-10-CM

## 2024-05-09 DIAGNOSIS — M6281 Muscle weakness (generalized): Secondary | ICD-10-CM

## 2024-05-09 DIAGNOSIS — M5459 Other low back pain: Secondary | ICD-10-CM

## 2024-05-09 DIAGNOSIS — R252 Cramp and spasm: Secondary | ICD-10-CM

## 2024-05-09 DIAGNOSIS — R2681 Unsteadiness on feet: Secondary | ICD-10-CM

## 2024-05-09 NOTE — Therapy (Signed)
 OUTPATIENT PHYSICAL THERAPY THORACOLUMBAR TREATMENT    Patient Name: Pamela Cox MRN: [id number] DOB:10-Apr-1941, 83 y.o., female Today's Date: 05/09/2024 Progress Note Reporting Period 03/08/24 to 05/09/24  See note below for Objective Data and Assessment of Progress/Goals.     END OF SESSION:  PT End of Session - 05/09/24 1230     Visit Number 19    Date for PT Re-Evaluation 06/01/24    Authorization Type [employer] MCR    Progress Note Due on Visit 29    PT Start Time 1146    PT Stop Time 1228    PT Time Calculation (min) 42 min    Activity Tolerance Patient tolerated treatment well    Behavior During Therapy WFL for tasks assessed/performed                         Past Medical History:  Diagnosis Date   Arthritis    CML (chronic myelocytic leukemia) (HCC)    CVA (cerebral vascular accident) (HCC)    Diabetes mellitus without complication (HCC)    GERD (gastroesophageal reflux disease)    Hypertension    Myelocytic leukemia, chronic (HCC)    Obesity    Sleep apnea    Past Surgical History:  Procedure Laterality Date   APPENDECTOMY     COLONOSCOPY     COLONOSCOPY WITH PROPOFOL  N/A 07/16/2019   Procedure: COLONOSCOPY WITH PROPOFOL ;  Surgeon: Deveron Fly, MD;  Location: [facility] ENDOSCOPY;  Service: Endoscopy;  Laterality: N/A;   ESOPHAGOGASTRODUODENOSCOPY (EGD) WITH PROPOFOL  N/A 07/16/2019   Procedure: ESOPHAGOGASTRODUODENOSCOPY (EGD) WITH PROPOFOL ;  Surgeon: Deveron Fly, MD;  Location: [facility] ENDOSCOPY;  Service: Endoscopy;  Laterality: N/A;   TOTAL SHOULDER REPLACEMENT Right 2021   TUBAL LIGATION     Patient Active Problem List   Diagnosis Date Noted   Allergic rhinitis due to pollen 08/23/2018   DDD (degenerative disc disease), lumbar 08/23/2018   Lumbar neuritis 08/23/2018   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   GERD (gastroesophageal reflux disease) 08/23/2018   Morbid obesity (HCC) 08/23/2018   Obstructive sleep apnea 08/23/2018    Intervertebral disc disorder with radiculopathy of lumbar region 08/23/2018   CML (chronic myelocytic leukemia) (HCC) 08/23/2018   Hx of myocardial perfusion scan 08/23/2018   Osteoarthritis 08/23/2018   Degenerative arthritis of hip 08/23/2018   Lumbar spondylosis 08/23/2018   SOB (shortness of breath) 08/23/2018   History of CVA (cerebrovascular accident) 08/23/2018   Essential hypertension 08/23/2018   Chronic right shoulder pain 08/23/2018   Complete tear of right rotator cuff 08/23/2018   Acute pain of left shoulder 08/23/2018   On antineoplastic chemotherapy 08/23/2018   Hyperlipidemia 08/23/2018   Neurogenic claudication due to lumbar spinal stenosis 08/23/2018   Vitamin D deficiency 08/23/2018    PCP: [facility]   REFERRING PROVIDER: Retta Caster, MD   REFERRING DIAG: [phone] (ICD-10-CM) - Spondylosis of lumbar region without myelopathy or radiculopathy  Low back pain, multifidus muscle training   Rationale for Evaluation and Treatment: Rehabilitation  THERAPY DIAG:  Muscle weakness (generalized)  Difficulty in walking, not elsewhere classified  Other low back pain  Cramp and spasm  Unsteadiness on feet  ONSET DATE: years  SUBJECTIVE:  SUBJECTIVE STATEMENT:  I've been doing as much as I can.   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 Yes: NPRS scale: 5/10 Pain location: low back Left side worse Pain description: sore and weak Aggravating factors: overhead reaching (putting dishes away) Relieving factors: no  PRECAUTIONS: None  RED FLAGS: None   WEIGHT BEARING RESTRICTIONS: No  FALLS:  Has patient fallen in last 6 months? No  LIVING ENVIRONMENT: Lives with:  lives with their family and lives with their daughter Lives in: House/apartment Stairs: 14 steps with rail on left Has following equipment at home: Otho Blitz - 2 wheeled, Environmental consultant - 4 wheeled, and Wheelchair (manual)  OCCUPATION: retired  PLOF: Independent  PATIENT GOALS: get moving better, decrease soreness, hard to get undergarments on, getting around in bed  NEXT MD VISIT: after PT  OBJECTIVE:  Note: Objective measures were completed at Evaluation unless otherwise noted.  DIAGNOSTIC FINDINGS:  XR 2023: Findings/Impression:   Segmentation: There are five non-rib-bearing lumbar-type vertebral bodies.  Sclerotic degenerative changes along the L5 inferior and S1 superior  endplate.  Bones: Generalized osteopenia. No displaced fracture or loss of vertebral  body height.  Limited evaluation of the sacrum due to overlying bowel gas.  Alignment: Slight lumbar dextrocurvature. Unchanged slight grade 1  anterolisthesis of L3 on L4 and L4 on L5.  Degenerative changes: Interval progression of severe multilevel lumbar  spondylosis from 2021. Mild bilateral hip and bilateral SI joint  degenerative changes. Pubic symphysis appears maintained.   PATIENT SURVEYS:  Eval:  Modified Oswestry  27 / 50 = 54.0 %  03/06/2024 : ODI- 20/50 40% 04/03/24:  Modified Oswestry Low Back Pain Disability Questionnaire: 25 / 50 = 50.0 %   COGNITION: Overall cognitive status: Within functional limits for tasks assessed     SENSATION: WFL  MUSCLE LENGTH: B HS WNL, R piriformis mild tightness  POSTURE: flexed trunk   PALPATION: Palpation: TTP at gluteals  LUMBAR ROM:   AROM eval  Flexion full  Extension Painful limited 80%  Right lateral flexion Limited and LOB   Left lateral flexion   Right rotation Limited 60%  Left rotation Limited 50%   (Blank rows = not tested)  LOWER EXTREMITY ROM:   WFL for tasks assessed   LOWER EXTREMITY MMT:    MMT Right eval Left eval  Hip flexion 4- 4  Hip  extension    Hip abduction    Hip adduction    Hip internal rotation    Hip external rotation    Knee flexion 5 5  Knee extension 5 5  Ankle dorsiflexion 5 5  Ankle plantarflexion    Ankle inversion    Ankle eversion     (Blank rows = not tested)   FUNCTIONAL TESTS:  Eval: 5 times sit to stand: 23.47 with B UE assist 3 minute walk test: 2.5 min 268 ft  02/10/2024: 3 minute walk test: 304 ft with RW (cuing for keeping walker closer to body to allow for improved upright posture) 5 times sit to stand: 21.16 sec with use of bilat UE  02/28/2024 : 353ft with RW (one minute seated rest beak after 1 minute)  03/06/2024 5 STS:20.15 sec with UE support  04/03/2024: 5 times sit to stand:  22.50 sec with UE support Timed up and Go (TUG):  19.63 sec with RW 3 minute walk test:  304 ft with RW (one brief seated  recovery period)       TREATMENT DATE:   05/09/2024: Nustep level 5 x8 min with PT present to discuss status Sit to/from stand x10 Walk down hall and back 2x1 min- verbal cues for alignment and control with stand to sit Seated with 3#:  long arc quad, step up and over yardstick on floor, hip ER.  2x10 each bilat Standing: shoulder to hip diagonals with yellow weighted ball 2x10 each  Standing rows blue band 2x10 Trunk extension seated with magenta loop 2x10   04/24/2024: Nustep level 5 x7 min with PT present to discuss status Sit to/from stand x10 Walk down hall and back 2x1 min- verbal cues for alignment and control with stand to sit Seated with 2.5#:  long arc quad, step up and over yardstick on floor, hip ER.  2x10 each bilat Standing: shoulder to hip diagonals with yellow weighted ball 2x10 each  Standing rows green band 2x10 Trunk extension seated with pink loop 2x10  04/19/2024: Nustep level 5 x6 min with PT present to discuss status Sit to/from stand x10 Seated with 2.5#:  long arc quad, step up and over yardstick on floor, hip ER.  2x10 each bilat Standing:  shoulder to hip diagonals with yellow weighted ball 2x10 each  Press into foam roll in sitting for TA activation 2x10 Trunk extension seated with teal loop 2x10   04/10/2024: Nustep level 5 x6 min with PT present to discuss status Sit to/from stand x10 Seated with 2.5#:  long arc quad, step up and over yardstick on floor, hip ER.  2x10 each bilat Standing: shoulder to hip diagonals with yellow weighted ball 2x10 each  Press into foam roll in sitting for TA activation 2x10 Trunk extension seated with teal loop 2x10   PATIENT EDUCATION:  Education details: PT eval findings, anticipated POC, and initial HEP , [email or web address] Person educated: Patient Education method: Explanation, Demonstration, and Handouts Education comprehension: verbalized understanding and returned demonstration  HOME EXERCISE PROGRAM: Access Code: [email or web address] URL: [email or web address] Date: 03/06/2024 Prepared by: Penelope Bowie  Exercises - Supine Bridge  - 2 x daily - 7 x weekly - 1-3 sets - 10 reps - Sit to Stand with Counter Support  - 2 x daily - 3 x weekly - 1 sets - 5 reps - Seated Long Arc Quad  - 3 x daily - 7 x weekly - 2 sets - 10 reps - 5 hold - Seated March   - 3 x daily - 7 x weekly - 3 sets - 10 reps - Seated Heel Toe Raises   - 3 x daily - 7 x weekly - 2 sets - 10 reps - Seated Isometric Hip Adduction with Ball  - 3 x daily - 7 x weekly - 2 sets - 10 reps - Standing March with Counter Support  - 1 x daily - 7 x weekly - 2 sets - 10 reps - Standing Heel Raise with Support  - 1 x daily - 7 x weekly - 1 sets - 10 reps - Standing Hip Abduction with Counter Support  - 1 x daily - 7 x weekly - 1 sets - 10 reps  ASSESSMENT:  CLINICAL IMPRESSION: Pt continues to be challenged by current level of activity at home and in the clinic.  She did well with addition of weight with LE exercises today.  She fatigues quickly with walking Patient continues to require skilled PT to progress her activity  tolerance to prepare for upcoming procedure in August for  nerve stimulator.  OBJECTIVE IMPAIRMENTS: decreased activity tolerance, decreased balance, difficulty walking, decreased ROM, decreased strength, increased muscle spasms, impaired flexibility, postural dysfunction, obesity, and pain.   ACTIVITY LIMITATIONS: carrying, lifting, standing, stairs, bed mobility, dressing, reach over head, hygiene/grooming, and locomotion level  PARTICIPATION LIMITATIONS: meal prep, cleaning, laundry, community activity, and church  PERSONAL FACTORS: Age, Fitness, Past/current experiences, Time since onset of injury/illness/exacerbation, and 3+ comorbidities: CVA x 3, L shoulder OA, obesity are also affecting patient's functional outcome.   REHAB POTENTIAL: Fair due to multiple comorbidities and frequency of visits  CLINICAL DECISION MAKING: Evolving/moderate complexity  EVALUATION COMPLEXITY: Moderate   GOALS: Goals reviewed with patient? Yes  SHORT TERM GOALS: Target date: 02/07/2024   Patient will be independent with initial HEP.  Baseline:  Goal status: MET  2.  Patient will demonstrate improved LE strength by improving 5XSTS by 2-3 seconds.  Baseline: 23.47 Goal status: Met on 02/10/24  3.  Able to complete with RW Baseline: 304 ft with RW Goal status: Met on 02/10/2024   LONG TERM GOALS: Target date: 06/01/24   Patient will be independent with advanced/ongoing HEP to improve outcomes and carryover.  Baseline:  Goal status: In Progress 04/19/24  2.  Patient will report 50% improvement in low back pain with ADLs to improve QOL.  Baseline: 40% (04/19/24) Goal status: In Progress   3.  Patient will demonstrate improved LE strength by being able climb stairs with 50% less shoulder pain.   Baseline: relies heavily on Lt UE due to significant LE fatigue (04/19/24) Goal status: In progress   4.  Patient will report improved ease of donning her undergarments by 50% Baseline: must do in  supine bridge position Goal status: In progress (a little better < 50% better ) 03/06/2024  5.  Patient will score < = 21 on the Modified Oswestry demonstrating improved functional ability.  Baseline: 27 / 50 Goal status: MET 03/06/2024  6.  Patient will demonstrate increased endurance with walking by completing a 4 MWT. Baseline: 2.5-3 min (04/19/24) Goal status: In Progress (see above)    PLAN:  PT FREQUENCY: 1-2x/week  PT DURATION: 8 weeks  PLANNED INTERVENTIONS: 97164- PT Re-evaluation, 97110-Therapeutic exercises, 97530- Therapeutic activity, [id number]- Neuromuscular re-education, 97535- Self Care, 21308- Manual therapy, [id number]- Gait training, [phone]- Electrical stimulation (unattended), [phone]- Traction (mechanical), Patient/Family education, Stair training, Taping, Dry Needling, Joint mobilization, Spinal mobilization, DME instructions, Cryotherapy, and Moist heat.  PLAN FOR NEXT SESSION: core exercises; walking; leg strengthening ; manual as indicated. 2 more sessions probable.     Luella Sager, PT 05/09/24 12:31 PM   [facility] [address], [address] [place], [place] [address] Phone # [phone] Fax [phone]

## 2024-05-15 ENCOUNTER — Encounter: Payer: Self-pay | Admitting: Physical Therapy

## 2024-05-15 ENCOUNTER — Ambulatory Visit: Admitting: Physical Therapy

## 2024-05-15 DIAGNOSIS — M6281 Muscle weakness (generalized): Secondary | ICD-10-CM | POA: Diagnosis not present

## 2024-05-15 DIAGNOSIS — R262 Difficulty in walking, not elsewhere classified: Secondary | ICD-10-CM

## 2024-05-15 DIAGNOSIS — R2681 Unsteadiness on feet: Secondary | ICD-10-CM

## 2024-05-15 DIAGNOSIS — M5459 Other low back pain: Secondary | ICD-10-CM

## 2024-05-15 DIAGNOSIS — R252 Cramp and spasm: Secondary | ICD-10-CM

## 2024-05-15 NOTE — Therapy (Signed)
 OUTPATIENT PHYSICAL THERAPY THORACOLUMBAR TREATMENT /DISCHARGE NOTE   Patient Name: Pamela Cox MRN: [id number] DOB:1941/10/09, 83 y.o., female Today's Date: 05/15/2024    END OF SESSION:  PT End of Session - 05/15/24 1351     Visit Number 20    Date for PT Re-Evaluation 06/01/24    Authorization Type [employer] MCR    Progress Note Due on Visit 29    PT Start Time 1145    PT Stop Time 1228    PT Time Calculation (min) 43 min    Activity Tolerance Patient tolerated treatment well    Behavior During Therapy WFL for tasks assessed/performed                          Past Medical History:  Diagnosis Date   Arthritis    CML (chronic myelocytic leukemia) (HCC)    CVA (cerebral vascular accident) (HCC)    Diabetes mellitus without complication (HCC)    GERD (gastroesophageal reflux disease)    Hypertension    Myelocytic leukemia, chronic (HCC)    Obesity    Sleep apnea    Past Surgical History:  Procedure Laterality Date   APPENDECTOMY     COLONOSCOPY     COLONOSCOPY WITH PROPOFOL  N/A 07/16/2019   Procedure: COLONOSCOPY WITH PROPOFOL ;  Surgeon: Gaylyn Gladis PENNER, MD;  Location: [facility] ENDOSCOPY;  Service: Endoscopy;  Laterality: N/A;   ESOPHAGOGASTRODUODENOSCOPY (EGD) WITH PROPOFOL  N/A 07/16/2019   Procedure: ESOPHAGOGASTRODUODENOSCOPY (EGD) WITH PROPOFOL ;  Surgeon: Gaylyn Gladis PENNER, MD;  Location: [facility] ENDOSCOPY;  Service: Endoscopy;  Laterality: N/A;   TOTAL SHOULDER REPLACEMENT Right 2021   TUBAL LIGATION     Patient Active Problem List   Diagnosis Date Noted   Allergic rhinitis due to pollen 08/23/2018   DDD (degenerative disc disease), lumbar 08/23/2018   Lumbar neuritis 08/23/2018   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   GERD (gastroesophageal reflux disease) 08/23/2018   Morbid obesity (HCC) 08/23/2018   Obstructive sleep apnea 08/23/2018   Intervertebral disc disorder with radiculopathy of lumbar region 08/23/2018   CML (chronic myelocytic leukemia)  (HCC) 08/23/2018   Hx of myocardial perfusion scan 08/23/2018   Osteoarthritis 08/23/2018   Degenerative arthritis of hip 08/23/2018   Lumbar spondylosis 08/23/2018   SOB (shortness of breath) 08/23/2018   History of CVA (cerebrovascular accident) 08/23/2018   Essential hypertension 08/23/2018   Chronic right shoulder pain 08/23/2018   Complete tear of right rotator cuff 08/23/2018   Acute pain of left shoulder 08/23/2018   On antineoplastic chemotherapy 08/23/2018   Hyperlipidemia 08/23/2018   Neurogenic claudication due to lumbar spinal stenosis 08/23/2018   Vitamin D deficiency 08/23/2018    PCP: [facility]   REFERRING PROVIDER: Tamsen Maude Rayfield Learta, MD   REFERRING DIAG: [phone] (ICD-10-CM) - Spondylosis of lumbar region without myelopathy or radiculopathy  Low back pain, multifidus muscle training   Rationale for Evaluation and Treatment: Rehabilitation  THERAPY DIAG:  Difficulty in walking, not elsewhere classified  Muscle weakness (generalized)  Other low back pain  Cramp and spasm  Unsteadiness on feet  ONSET DATE: years  SUBJECTIVE:  SUBJECTIVE STATEMENT:  Patient reports she is sore today. She was sore getting dressed today.  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 Yes: NPRS scale: 7-8/10 Pain location: low back Left side worse Pain description: sore and weak Aggravating factors: overhead reaching (putting dishes away) Relieving factors: no  PRECAUTIONS: None  RED FLAGS: None   WEIGHT BEARING RESTRICTIONS: No  FALLS:  Has patient fallen in last 6 months? No  LIVING ENVIRONMENT: Lives with: lives with their family and lives with their daughter Lives in:  House/apartment Stairs: 14 steps with rail on left Has following equipment at home: Vannie - 2 wheeled, Environmental consultant - 4 wheeled, and Wheelchair (manual)  OCCUPATION: retired  PLOF: Independent  PATIENT GOALS: get moving better, decrease soreness, hard to get undergarments on, getting around in bed  NEXT MD VISIT: after PT  OBJECTIVE:  Note: Objective measures were completed at Evaluation unless otherwise noted.  DIAGNOSTIC FINDINGS:  XR 2023: Findings/Impression:   Segmentation: There are five non-rib-bearing lumbar-type vertebral bodies.  Sclerotic degenerative changes along the L5 inferior and S1 superior  endplate.  Bones: Generalized osteopenia. No displaced fracture or loss of vertebral  body height.  Limited evaluation of the sacrum due to overlying bowel gas.  Alignment: Slight lumbar dextrocurvature. Unchanged slight grade 1  anterolisthesis of L3 on L4 and L4 on L5.  Degenerative changes: Interval progression of severe multilevel lumbar  spondylosis from 2021. Mild bilateral hip and bilateral SI joint  degenerative changes. Pubic symphysis appears maintained.   PATIENT SURVEYS:  Eval:  Modified Oswestry  27 / 50 = 54.0 %  03/06/2024 : ODI- 20/50 40% 04/03/24:  Modified Oswestry Low Back Pain Disability Questionnaire: 25 / 50 = 50.0 %   COGNITION: Overall cognitive status: Within functional limits for tasks assessed     SENSATION: WFL  MUSCLE LENGTH: B HS WNL, R piriformis mild tightness  POSTURE: flexed trunk   PALPATION: Palpation: TTP at gluteals  LUMBAR ROM:   AROM eval  Flexion full  Extension Painful limited 80%  Right lateral flexion Limited and LOB   Left lateral flexion   Right rotation Limited 60%  Left rotation Limited 50%   (Blank rows = not tested)  LOWER EXTREMITY ROM:   WFL for tasks assessed   LOWER EXTREMITY MMT:    MMT Right eval Left eval  Hip flexion 4- 4  Hip extension    Hip abduction    Hip adduction    Hip internal  rotation    Hip external rotation    Knee flexion 5 5  Knee extension 5 5  Ankle dorsiflexion 5 5  Ankle plantarflexion    Ankle inversion    Ankle eversion     (Blank rows = not tested)   FUNCTIONAL TESTS:  Eval: 5 times sit to stand: 23.47 with B UE assist 3 minute walk test: 2.5 min 268 ft  02/10/2024: 3 minute walk test: 304 ft with RW (cuing for keeping walker closer to body to allow for improved upright posture) 5 times sit to stand: 21.16 sec with use of bilat UE  02/28/2024 : 331ft with RW (one minute seated rest beak after 1 minute)  03/06/2024 5 STS:20.15 sec with UE support  04/03/2024: 5 times sit to stand:  22.50 sec with UE support Timed up and Go (TUG):  19.63 sec with RW 3 minute walk test:  304 ft with RW (  one brief seated recovery period)       TREATMENT DATE:  05/15/2024: Nustep level 4 x6 min with PT present to discuss status Sit to/from stand x10 Walk down hall and back 2x1 min- verbal cues for alignment and control with stand to sit Seated with 3#:  long arc quad, step up and over yardstick on floor, hip ER.  2x10 each bilat 4MWT:374ft Seated shoulder row with blue TB 2 x 10 Standing: shoulder to hip diagonals with yellow weighted ball x10 each  Trunk extension seated with magenta loop 2x10  05/09/2024: Nustep level 5 x8 min with PT present to discuss status Sit to/from stand x10 Walk down hall and back 2x1 min- verbal cues for alignment and control with stand to sit Seated with 3#:  long arc quad, step up and over yardstick on floor, hip ER.  2x10 each bilat Standing: shoulder to hip diagonals with yellow weighted ball 2x10 each  Standing rows blue band 2x10 Trunk extension seated with magenta loop 2x10   04/24/2024: Nustep level 5 x7 min with PT present to discuss status Sit to/from stand x10 Walk down hall and back 2x1 min- verbal cues for alignment and control with stand to sit Seated with 2.5#:  long arc quad, step up and over yardstick  on floor, hip ER.  2x10 each bilat Standing: shoulder to hip diagonals with yellow weighted ball 2x10 each  Standing rows green band 2x10 Trunk extension seated with pink loop 2x10    PATIENT EDUCATION:  Education details: PT eval findings, anticipated POC, and initial HEP , [email or web address] Person educated: Patient Education method: Explanation, Demonstration, and Handouts Education comprehension: verbalized understanding and returned demonstration  HOME EXERCISE PROGRAM: Access Code: [email or web address] URL: [email or web address] Date: 03/06/2024 Prepared by: Kristeen Sar  Exercises - Supine Bridge  - 2 x daily - 7 x weekly - 1-3 sets - 10 reps - Sit to Stand with Counter Support  - 2 x daily - 3 x weekly - 1 sets - 5 reps - Seated Long Arc Quad  - 3 x daily - 7 x weekly - 2 sets - 10 reps - 5 hold - Seated March   - 3 x daily - 7 x weekly - 3 sets - 10 reps - Seated Heel Toe Raises   - 3 x daily - 7 x weekly - 2 sets - 10 reps - Seated Isometric Hip Adduction with Ball  - 3 x daily - 7 x weekly - 2 sets - 10 reps - Standing March with Counter Support  - 1 x daily - 7 x weekly - 2 sets - 10 reps - Standing Heel Raise with Support  - 1 x daily - 7 x weekly - 1 sets - 10 reps - Standing Hip Abduction with Counter Support  - 1 x daily - 7 x weekly - 1 sets - 10 reps  ASSESSMENT:  CLINICAL IMPRESSION:   Ms. Bartling verbalizes increased soreness in her back today. Discharge completed today due to patient feeling ready for discharge. She has made some improvements with physical therapy, but her back pain still limits her functionally. She verbalized she struggling to put on her undergarments. She verbalized feeling a little nervous for the nerve stimulation. Educated patient to speak with her provider to express her concerns. Patient to discharge home with HEP.  OBJECTIVE IMPAIRMENTS: decreased activity tolerance, decreased balance, difficulty walking, decreased ROM, decreased strength,  increased muscle spasms, impaired flexibility, postural dysfunction, obesity, and pain.   ACTIVITY  LIMITATIONS: carrying, lifting, standing, stairs, bed mobility, dressing, reach over head, hygiene/grooming, and locomotion level  PARTICIPATION LIMITATIONS: meal prep, cleaning, laundry, community activity, and church  PERSONAL FACTORS: Age, Fitness, Past/current experiences, Time since onset of injury/illness/exacerbation, and 3+ comorbidities: CVA x 3, L shoulder OA, obesity are also affecting patient's functional outcome.   REHAB POTENTIAL: Fair due to multiple comorbidities and frequency of visits  CLINICAL DECISION MAKING: Evolving/moderate complexity  EVALUATION COMPLEXITY: Moderate   GOALS: Goals reviewed with patient? Yes  SHORT TERM GOALS: Target date: 02/07/2024   Patient will be independent with initial HEP.  Baseline:  Goal status: MET  2.  Patient will demonstrate improved LE strength by improving 5XSTS by 2-3 seconds.  Baseline: 23.47 Goal status: Met on 02/10/24  3.  Able to complete with RW Baseline: 304 ft with RW Goal status: Met on 02/10/2024   LONG TERM GOALS: Target date: 06/01/24   Patient will be independent with advanced/ongoing HEP to improve outcomes and carryover.  Baseline:  Goal status: MET 05/15/2024  2.  Patient will report 50% improvement in low back pain with ADLs to improve QOL.  Baseline: 40% (04/19/24) Goal status: NOT MET  3.  Patient will demonstrate improved LE strength by being able climb stairs with 50% less shoulder pain.   Baseline: relies heavily on Lt UE due to significant LE fatigue (04/19/24) Goal status: Partially Met  4.  Patient will report improved ease of donning her undergarments by 50% Baseline: must do in supine bridge position Goal status: In progress (a little better < 50% better ) MET 05/15/2024  5.  Patient will score < = 21 on the Modified Oswestry demonstrating improved functional ability.  Baseline: 27 /  50 Goal status: MET 03/06/2024  6.  Patient will demonstrate increased endurance with walking by completing a 4 MWT. Baseline: 2.5-3 min (04/19/24) Goal status: MET 05/15/2024    PLAN:  PT FREQUENCY: 1-2x/week  PT DURATION: 8 weeks  PLANNED INTERVENTIONS: 97164- PT Re-evaluation, 97110-Therapeutic exercises, 97530- Therapeutic activity, 97112- Neuromuscular re-education, 97535- Self Care, [id number]- Manual therapy, [id number]- Gait training, [phone]- Electrical stimulation (unattended), [phone]- Traction (mechanical), Patient/Family education, Stair training, Taping, Dry Needling, Joint mobilization, Spinal mobilization, DME instructions, Cryotherapy, and Moist heat.  PLAN FOR NEXT SESSION: Patient to discharge home with HEP     Kristeen Sar, PT 05/15/24 1:53 PM [facility] [address] [place], [place] [address] Phone # [phone] Fax [phone]   PHYSICAL THERAPY DISCHARGE SUMMARY  Visits from Start of Care: 20  Current functional level related to goals / functional outcomes: Discharge completed today due to patient feeling ready for discharge. She has made some improvements with physical therapy, but her back pain still limits her functionally.    Remaining deficits: Back Pain   Education / Equipment: See above   Patient agrees to discharge. Patient goals were partially met. Patient is being discharged due to being pleased with the current functional level.

## 2024-05-22 ENCOUNTER — Encounter

## 2024-10-16 ENCOUNTER — Other Ambulatory Visit: Payer: Self-pay | Admitting: Family Medicine

## 2024-10-16 DIAGNOSIS — Z1231 Encounter for screening mammogram for malignant neoplasm of breast: Secondary | ICD-10-CM

## 2024-11-02 ENCOUNTER — Other Ambulatory Visit: Payer: Self-pay | Admitting: Otolaryngology

## 2024-11-02 DIAGNOSIS — E041 Nontoxic single thyroid nodule: Secondary | ICD-10-CM

## 2024-11-06 ENCOUNTER — Other Ambulatory Visit

## 2024-11-19 ENCOUNTER — Ambulatory Visit
Admission: RE | Admit: 2024-11-19 | Discharge: 2024-11-19 | Disposition: A | Source: Ambulatory Visit | Attending: Otolaryngology | Admitting: Otolaryngology

## 2024-11-19 DIAGNOSIS — E041 Nontoxic single thyroid nodule: Secondary | ICD-10-CM

## 2024-11-26 DIAGNOSIS — E042 Nontoxic multinodular goiter: Secondary | ICD-10-CM

## 2024-12-04 ENCOUNTER — Ambulatory Visit
Admission: RE | Admit: 2024-12-04 | Discharge: 2024-12-04 | Disposition: A | Discharge status: Home / Self Care | Source: Ambulatory Visit | Attending: Family Medicine | Admitting: Family Medicine

## 2024-12-04 DIAGNOSIS — Z1231 Encounter for screening mammogram for malignant neoplasm of breast: Secondary | ICD-10-CM | POA: Insufficient documentation
